# Patient Record
Sex: Female | Born: 1965 | Race: White | Hispanic: No | Marital: Married | Smoking: Never smoker
Health system: Southern US, Community
[De-identification: ages and names within clinical notes are randomized; demographics above are authoritative.]

## PROBLEM LIST (undated history)

## (undated) DIAGNOSIS — Z9889 Other specified postprocedural states: Secondary | ICD-10-CM

## (undated) DIAGNOSIS — Z780 Asymptomatic menopausal state: Secondary | ICD-10-CM

## (undated) DIAGNOSIS — M1990 Unspecified osteoarthritis, unspecified site: Secondary | ICD-10-CM

## (undated) DIAGNOSIS — D759 Disease of blood and blood-forming organs, unspecified: Secondary | ICD-10-CM

## (undated) DIAGNOSIS — I739 Peripheral vascular disease, unspecified: Secondary | ICD-10-CM

## (undated) DIAGNOSIS — R011 Cardiac murmur, unspecified: Secondary | ICD-10-CM

## (undated) DIAGNOSIS — R600 Localized edema: Secondary | ICD-10-CM

## (undated) DIAGNOSIS — C801 Malignant (primary) neoplasm, unspecified: Secondary | ICD-10-CM

## (undated) DIAGNOSIS — I10 Essential (primary) hypertension: Secondary | ICD-10-CM

## (undated) DIAGNOSIS — Z9289 Personal history of other medical treatment: Secondary | ICD-10-CM

## (undated) DIAGNOSIS — F419 Anxiety disorder, unspecified: Secondary | ICD-10-CM

## (undated) DIAGNOSIS — M549 Dorsalgia, unspecified: Secondary | ICD-10-CM

## (undated) DIAGNOSIS — R112 Nausea with vomiting, unspecified: Secondary | ICD-10-CM

## (undated) DIAGNOSIS — F329 Major depressive disorder, single episode, unspecified: Secondary | ICD-10-CM

## (undated) DIAGNOSIS — B519 Plasmodium vivax malaria without complication: Secondary | ICD-10-CM

## (undated) DIAGNOSIS — E2749 Other adrenocortical insufficiency: Secondary | ICD-10-CM

## (undated) DIAGNOSIS — G43909 Migraine, unspecified, not intractable, without status migrainosus: Secondary | ICD-10-CM

## (undated) DIAGNOSIS — E232 Diabetes insipidus: Secondary | ICD-10-CM

## (undated) DIAGNOSIS — J45909 Unspecified asthma, uncomplicated: Secondary | ICD-10-CM

## (undated) DIAGNOSIS — E7800 Pure hypercholesterolemia, unspecified: Secondary | ICD-10-CM

## (undated) DIAGNOSIS — Z8489 Family history of other specified conditions: Secondary | ICD-10-CM

## (undated) DIAGNOSIS — K829 Disease of gallbladder, unspecified: Secondary | ICD-10-CM

## (undated) DIAGNOSIS — F32A Depression, unspecified: Secondary | ICD-10-CM

## (undated) DIAGNOSIS — Z91018 Allergy to other foods: Secondary | ICD-10-CM

## (undated) DIAGNOSIS — F988 Other specified behavioral and emotional disorders with onset usually occurring in childhood and adolescence: Secondary | ICD-10-CM

## (undated) DIAGNOSIS — Z8709 Personal history of other diseases of the respiratory system: Secondary | ICD-10-CM

## (undated) DIAGNOSIS — E559 Vitamin D deficiency, unspecified: Secondary | ICD-10-CM

## (undated) DIAGNOSIS — J189 Pneumonia, unspecified organism: Secondary | ICD-10-CM

## (undated) DIAGNOSIS — Z87440 Personal history of urinary (tract) infections: Secondary | ICD-10-CM

## (undated) HISTORY — PX: SHOULDER ARTHROSCOPY: SHX128

## (undated) HISTORY — PX: DIAGNOSTIC LAPAROSCOPY: SUR761

## (undated) HISTORY — DX: Allergy to other foods: Z91.018

## (undated) HISTORY — PX: BREAST BIOPSY: SHX20

## (undated) HISTORY — PX: TRANSPHENOIDAL PITUITARY RESECTION: SHX2572

## (undated) HISTORY — PX: COLONOSCOPY: SHX174

## (undated) HISTORY — DX: Disease of gallbladder, unspecified: K82.9

## (undated) HISTORY — DX: Asymptomatic menopausal state: Z78.0

## (undated) HISTORY — DX: Other specified behavioral and emotional disorders with onset usually occurring in childhood and adolescence: F98.8

## (undated) HISTORY — PX: APPENDECTOMY: SHX54

## (undated) HISTORY — DX: Unspecified asthma, uncomplicated: J45.909

## (undated) HISTORY — DX: Vitamin D deficiency, unspecified: E55.9

## (undated) HISTORY — DX: Localized edema: R60.0

## (undated) HISTORY — DX: Dorsalgia, unspecified: M54.9

## (undated) HISTORY — DX: Anxiety disorder, unspecified: F41.9

## (undated) HISTORY — PX: BREAST LUMPECTOMY: SHX2

---
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
## 1993-09-26 HISTORY — PX: VAGINAL HYSTERECTOMY: SUR661

## 1997-09-26 DIAGNOSIS — B519 Plasmodium vivax malaria without complication: Secondary | ICD-10-CM

## 1997-09-26 HISTORY — DX: Plasmodium vivax malaria without complication: B51.9

## 1999-02-22 ENCOUNTER — Encounter: Payer: Self-pay | Admitting: Internal Medicine

## 1999-02-22 ENCOUNTER — Inpatient Hospital Stay (HOSPITAL_COMMUNITY): Admission: EM | Admit: 1999-02-22 | Discharge: 1999-02-24 | Payer: Self-pay | Admitting: Emergency Medicine

## 1999-03-01 ENCOUNTER — Inpatient Hospital Stay (HOSPITAL_COMMUNITY): Admission: EM | Admit: 1999-03-01 | Discharge: 1999-03-05 | Payer: Self-pay | Admitting: Emergency Medicine

## 1999-04-06 ENCOUNTER — Encounter: Admission: RE | Admit: 1999-04-06 | Discharge: 1999-04-06 | Payer: Self-pay | Admitting: Internal Medicine

## 1999-05-24 ENCOUNTER — Encounter: Admission: RE | Admit: 1999-05-24 | Discharge: 1999-05-24 | Payer: Self-pay | Admitting: Internal Medicine

## 1999-05-24 ENCOUNTER — Inpatient Hospital Stay (HOSPITAL_COMMUNITY): Admission: AD | Admit: 1999-05-24 | Discharge: 1999-05-25 | Payer: Self-pay | Admitting: Internal Medicine

## 1999-06-04 ENCOUNTER — Encounter: Admission: RE | Admit: 1999-06-04 | Discharge: 1999-06-04 | Payer: Self-pay | Admitting: Internal Medicine

## 1999-06-18 ENCOUNTER — Other Ambulatory Visit: Admission: RE | Admit: 1999-06-18 | Discharge: 1999-06-18 | Payer: Self-pay | Admitting: Obstetrics and Gynecology

## 1999-08-10 ENCOUNTER — Ambulatory Visit (HOSPITAL_COMMUNITY): Admission: RE | Admit: 1999-08-10 | Discharge: 1999-08-10 | Payer: Self-pay | Admitting: Infectious Diseases

## 1999-08-13 ENCOUNTER — Ambulatory Visit (HOSPITAL_COMMUNITY): Admission: RE | Admit: 1999-08-13 | Discharge: 1999-08-13 | Payer: Self-pay | Admitting: Infectious Diseases

## 1999-09-14 ENCOUNTER — Other Ambulatory Visit: Admission: RE | Admit: 1999-09-14 | Discharge: 1999-09-14 | Payer: Self-pay | Admitting: Obstetrics and Gynecology

## 1999-11-02 ENCOUNTER — Encounter (INDEPENDENT_AMBULATORY_CARE_PROVIDER_SITE_OTHER): Payer: Self-pay | Admitting: Specialist

## 1999-11-02 ENCOUNTER — Other Ambulatory Visit: Admission: RE | Admit: 1999-11-02 | Discharge: 1999-11-02 | Payer: Self-pay | Admitting: Obstetrics and Gynecology

## 2000-01-14 ENCOUNTER — Other Ambulatory Visit: Admission: RE | Admit: 2000-01-14 | Discharge: 2000-01-14 | Payer: Self-pay | Admitting: Obstetrics and Gynecology

## 2000-05-05 ENCOUNTER — Other Ambulatory Visit: Admission: RE | Admit: 2000-05-05 | Discharge: 2000-05-05 | Payer: Self-pay | Admitting: Obstetrics and Gynecology

## 2000-10-17 ENCOUNTER — Other Ambulatory Visit: Admission: RE | Admit: 2000-10-17 | Discharge: 2000-10-17 | Payer: Self-pay | Admitting: Obstetrics and Gynecology

## 2002-05-20 ENCOUNTER — Other Ambulatory Visit: Admission: RE | Admit: 2002-05-20 | Discharge: 2002-05-20 | Payer: Self-pay | Admitting: Obstetrics and Gynecology

## 2002-06-19 ENCOUNTER — Ambulatory Visit (HOSPITAL_COMMUNITY): Admission: RE | Admit: 2002-06-19 | Discharge: 2002-06-19 | Payer: Self-pay | Admitting: Gastroenterology

## 2002-06-19 ENCOUNTER — Encounter: Payer: Self-pay | Admitting: Gastroenterology

## 2002-06-20 ENCOUNTER — Encounter: Payer: Self-pay | Admitting: Gastroenterology

## 2002-06-20 ENCOUNTER — Ambulatory Visit (HOSPITAL_COMMUNITY): Admission: RE | Admit: 2002-06-20 | Discharge: 2002-06-20 | Payer: Self-pay | Admitting: Gastroenterology

## 2002-06-21 ENCOUNTER — Inpatient Hospital Stay (HOSPITAL_COMMUNITY): Admission: EM | Admit: 2002-06-21 | Discharge: 2002-06-24 | Payer: Self-pay | Admitting: Emergency Medicine

## 2002-06-21 ENCOUNTER — Encounter (INDEPENDENT_AMBULATORY_CARE_PROVIDER_SITE_OTHER): Payer: Self-pay | Admitting: *Deleted

## 2002-09-26 HISTORY — PX: CHOLECYSTECTOMY: SHX55

## 2002-10-01 ENCOUNTER — Ambulatory Visit (HOSPITAL_COMMUNITY): Admission: RE | Admit: 2002-10-01 | Discharge: 2002-10-01 | Payer: Self-pay | Admitting: Gastroenterology

## 2002-10-04 ENCOUNTER — Ambulatory Visit (HOSPITAL_COMMUNITY): Admission: RE | Admit: 2002-10-04 | Discharge: 2002-10-04 | Payer: Self-pay | Admitting: Gastroenterology

## 2002-10-04 ENCOUNTER — Encounter: Payer: Self-pay | Admitting: Gastroenterology

## 2002-10-11 ENCOUNTER — Encounter: Payer: Self-pay | Admitting: Internal Medicine

## 2002-10-11 ENCOUNTER — Encounter: Admission: RE | Admit: 2002-10-11 | Discharge: 2002-10-11 | Payer: Self-pay | Admitting: Internal Medicine

## 2003-04-30 ENCOUNTER — Other Ambulatory Visit: Admission: RE | Admit: 2003-04-30 | Discharge: 2003-04-30 | Payer: Self-pay | Admitting: Obstetrics and Gynecology

## 2003-06-06 ENCOUNTER — Other Ambulatory Visit: Admission: RE | Admit: 2003-06-06 | Discharge: 2003-06-06 | Payer: Self-pay | Admitting: Obstetrics and Gynecology

## 2003-06-30 ENCOUNTER — Ambulatory Visit (HOSPITAL_COMMUNITY): Admission: RE | Admit: 2003-06-30 | Discharge: 2003-06-30 | Payer: Self-pay | Admitting: Gastroenterology

## 2004-09-06 ENCOUNTER — Emergency Department (HOSPITAL_COMMUNITY): Admission: EM | Admit: 2004-09-06 | Discharge: 2004-09-07 | Payer: Self-pay | Admitting: Emergency Medicine

## 2004-09-26 HISTORY — PX: BLADDER SUSPENSION: SHX72

## 2004-11-03 ENCOUNTER — Other Ambulatory Visit: Admission: RE | Admit: 2004-11-03 | Discharge: 2004-11-03 | Payer: Self-pay | Admitting: Obstetrics and Gynecology

## 2005-01-28 ENCOUNTER — Ambulatory Visit (HOSPITAL_COMMUNITY): Admission: RE | Admit: 2005-01-28 | Discharge: 2005-01-28 | Payer: Self-pay | Admitting: Obstetrics and Gynecology

## 2005-03-21 ENCOUNTER — Observation Stay (HOSPITAL_COMMUNITY): Admission: RE | Admit: 2005-03-21 | Discharge: 2005-03-22 | Payer: Self-pay | Admitting: Urology

## 2006-01-30 ENCOUNTER — Ambulatory Visit (HOSPITAL_COMMUNITY): Admission: RE | Admit: 2006-01-30 | Discharge: 2006-01-30 | Payer: Self-pay | Admitting: Family Medicine

## 2006-05-23 ENCOUNTER — Encounter: Admission: RE | Admit: 2006-05-23 | Discharge: 2006-05-23 | Payer: Self-pay | Admitting: Gastroenterology

## 2006-06-01 ENCOUNTER — Other Ambulatory Visit: Admission: RE | Admit: 2006-06-01 | Discharge: 2006-06-01 | Payer: Self-pay | Admitting: Obstetrics and Gynecology

## 2006-09-04 ENCOUNTER — Encounter (INDEPENDENT_AMBULATORY_CARE_PROVIDER_SITE_OTHER): Payer: Self-pay | Admitting: Specialist

## 2006-09-04 ENCOUNTER — Ambulatory Visit (HOSPITAL_COMMUNITY): Admission: RE | Admit: 2006-09-04 | Discharge: 2006-09-04 | Payer: Self-pay | Admitting: Obstetrics and Gynecology

## 2007-02-21 ENCOUNTER — Ambulatory Visit (HOSPITAL_COMMUNITY): Admission: RE | Admit: 2007-02-21 | Discharge: 2007-02-21 | Payer: Self-pay | Admitting: Obstetrics and Gynecology

## 2007-12-14 ENCOUNTER — Ambulatory Visit (HOSPITAL_BASED_OUTPATIENT_CLINIC_OR_DEPARTMENT_OTHER): Admission: RE | Admit: 2007-12-14 | Discharge: 2007-12-14 | Payer: Self-pay | Admitting: Orthopedic Surgery

## 2008-09-26 HISTORY — PX: REPAIR PERONEAL TENDONS ANKLE: SUR1201

## 2009-03-16 ENCOUNTER — Encounter: Admission: RE | Admit: 2009-03-16 | Discharge: 2009-03-16 | Payer: Self-pay | Admitting: Unknown Physician Specialty

## 2009-04-21 ENCOUNTER — Encounter (INDEPENDENT_AMBULATORY_CARE_PROVIDER_SITE_OTHER): Payer: Self-pay | Admitting: Emergency Medicine

## 2009-04-21 ENCOUNTER — Emergency Department (HOSPITAL_COMMUNITY): Admission: EM | Admit: 2009-04-21 | Discharge: 2009-04-21 | Payer: Self-pay | Admitting: Emergency Medicine

## 2009-04-21 ENCOUNTER — Ambulatory Visit: Payer: Self-pay | Admitting: Vascular Surgery

## 2009-12-17 ENCOUNTER — Encounter: Admission: RE | Admit: 2009-12-17 | Discharge: 2009-12-17 | Payer: Self-pay | Admitting: Family Medicine

## 2009-12-21 ENCOUNTER — Encounter: Admission: RE | Admit: 2009-12-21 | Discharge: 2009-12-21 | Payer: Self-pay | Admitting: Family Medicine

## 2010-03-09 ENCOUNTER — Ambulatory Visit (HOSPITAL_BASED_OUTPATIENT_CLINIC_OR_DEPARTMENT_OTHER): Admission: RE | Admit: 2010-03-09 | Discharge: 2010-03-09 | Payer: Self-pay | Admitting: Surgery

## 2010-03-09 ENCOUNTER — Encounter: Admission: RE | Admit: 2010-03-09 | Discharge: 2010-03-09 | Payer: Self-pay | Admitting: Surgery

## 2010-03-24 ENCOUNTER — Encounter
Admission: RE | Admit: 2010-03-24 | Discharge: 2010-03-24 | Payer: Self-pay | Admitting: Physical Medicine and Rehabilitation

## 2010-10-18 ENCOUNTER — Encounter: Payer: Self-pay | Admitting: Orthopedic Surgery

## 2010-12-13 LAB — BASIC METABOLIC PANEL
GFR calc Af Amer: >60 mL/min (ref >60)
GFR calc non Af Amer: >60 mL/min (ref >60)
Glucose, Bld: 99 mg/dL (ref 70–99)

## 2010-12-13 LAB — POCT HEMOGLOBIN-HEMACUE: Hemoglobin: 15.1 g/dL — ABNORMAL HIGH (ref 12.0–15.0)

## 2010-12-17 ENCOUNTER — Other Ambulatory Visit: Payer: Self-pay | Admitting: Internal Medicine

## 2010-12-17 DIAGNOSIS — Z1231 Encounter for screening mammogram for malignant neoplasm of breast: Secondary | ICD-10-CM

## 2010-12-23 ENCOUNTER — Ambulatory Visit
Admission: RE | Admit: 2010-12-23 | Discharge: 2010-12-23 | Disposition: A | Discharge status: Home / Self Care | Payer: BC Managed Care – PPO | Source: Ambulatory Visit | Attending: Internal Medicine | Admitting: Internal Medicine

## 2010-12-23 DIAGNOSIS — Z1231 Encounter for screening mammogram for malignant neoplasm of breast: Secondary | ICD-10-CM

## 2011-02-08 NOTE — Op Note (Signed)
[name] [name], [name] [name]            ACCOUNT NO.:  [id number]   MEDICAL RECORD NO.:  [id number]          PATIENT TYPE:  AMB   LOCATION:  DSC                          FACILITY:  [facility]   PHYSICIAN:  Katy Fitch. Sypher, M.D. 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   OPERATION:  1. Examination right shoulder under anesthesia.  2. Gentle manipulation increasing range of motion of right shoulder.  3. Arthroscopic evaluation glenohumeral joint identifying significant      adhesions of subscapularis and anterior capsule followed by      anterior capsulectomy, tenolysis of subscapularis,  4. Extensive subacromial bursectomy, tenolysis of rotator cuff,      relaxation of coracoacromial ligament and acromioplasty.   OPERATING SURGEON:  [name] [name], M.D.   ASSISTANT:  [name] Maduro Dasnoit PA-C.   ANESTHESIA:  General by endotracheal technique supplemented by right  interscalene block.   SUPERVISING ANESTHESIOLOGIST:  [name] [name].   INDICATIONS:  [name] [name] is a 45 year old referred through the  courtesy of [name] [name] for evaluation and management of a painful  right shoulder.   [name] [name] had the insidious onset of progressive pain.  She was losing  motion.  She was unable to sleep on her shoulder comfortably at night.  We recommend that she proceed with supervised physical therapy for  several weeks.  Both [name] [name] and our therapist determined that she  was a poor candidate for therapy and that she could not tolerate the  repetitive stretching exercises due to her extreme pain [name].  Therefore we advised to proceed with a timely examination of the  shoulder under anesthesia anticipating manipulation, capsulectomy and  tenolysis of the cuff as necessary.   Preoperatively she was noted on plain films to have moderately  unfavorable medial acromial anatomy with a prominent anterior medial  osteophyte.   She was interviewed in the holding area by [name] [name] for an  anesthesia consult.  The risks and benefits of an interscalene block  were explained followed by placement of interscalene block without  complication.   After questions were invited and answered, she is brought to the  operating room at this time.   DESCRIPTION OF PROCEDURE:  [name] [name] is brought to the operating  room and placed in the supine position on the operating table.   Following induction of general endotracheal anesthesia under Dr.  Jarrett Ables direct supervision, she was carefully positioned in the  beach-chair position with the aid of a torso and head holder designed  for shoulder arthroscopy.  Examination of the right shoulder under  anesthesia revealed moderate stiffness.  Her combined elevation was 140  external rotation, at 90 degrees abduction, 70 degrees internal rotation  60 degrees.  With gentle manipulation lysis of adhesions was noted with  increase of combined elevation to 175 degrees, external rotation  at 90  degrees abduction to 90 degrees and internal rotation to 80 degrees.  The joint was stable to anterior, posterior and inferior translation.   The right arm was then prepped with DuraPrep and draped with impervious  arthroscopy drapes.  The procedure commenced with placement of the  arthroscope through a standard posterior viewing portal.  Diagnostic  arthroscopy revealed intact hyaline articular cartilage surfaces on the  glenoid and humeral head.   The biceps origin was normal.  The biceps tendon was normal through the  rotator interval.  The deep subscapularis was obscured by adhesions that  were rather mature in  appearance.  The subscapularis was intact.  The  supraspinatus, infraspinatus, and teres minor were intact.  The labrum  was intact.   An anterior portal was created under direct vision followed by use of  the suction shaver and cautery to release adhesions around the  subscapularis. The subscap was tenolysed both anterior and posterior  followed by identification of coracoid process.  Care was taken to avoid  the neurovascular structures.  The rotator interval was released with  the suction shaver and cutting cautery.   Attention then directed to the subacromial space.   The scope was removed from the glenohumeral joint and placed in the  subacromial space.  Florid bursitis and adhesions were noted.  After  complete bursectomy, the rotator cuff was tenolysed and the  coracoacromial ligament relaxed with cutting cautery.  The acromion had  a medial osteophyte.  The [facility] joint was not prominent.  The capsule of  the [facility] joint was preserved.  The acromion was leveled to a type 1  morphology with a suction bur followed by hemostasis.  Thereafter,  documentation of the decompression was accomplished with the digital  camera.  The scope was removed the portals repaired with mattress suture  of 3-0 Prolene.   [name] [name] was placed in a sling.  Her wounds dressed with sterile  gauze and paper tape.  She will be provided Tegaderm with advice for  husband to change the dressings in the morning.   I will see back in follow-up in 72 hours at which time we will  anticipate a postoperative x-ray and initiation of a therapy program.  She is provided prescriptions for hydromorphone 2 mg one p.o. q.4-6 h.  p.r.n. pain 30 tablets without refill.  Also Motrin 600 mg one p.o. q. 6  hours p.r.n. pain 30 tablets with one refill and Keflex 500 mg one p.o.  q.8 h x4 days as a prophylactic antibiotic.     Katy Fitch Sypher, M.D.  Electronically Signed    RVS/MEDQ  D:  12/14/2007  T:  12/14/2007  Job:   [id number]   cc:   Katy Fitch. Sypher, M.D.

## 2011-02-11 NOTE — H&P (Signed)
NAME:  [name] [name], [name] [name]            ACCOUNT NO.:  [id number]   MEDICAL RECORD NO.:  [id number]          PATIENT TYPE:  AMB   LOCATION:  SDC                           FACILITY:  [facility]   PHYSICIAN:  Sherron Monday, MD        DATE OF BIRTH:  1966/01/20   DATE OF ADMISSION:  09/04/2006  DATE OF DISCHARGE:                              HISTORY & PHYSICAL   PROCEDURE:  Diagnostic laparoscopy, possible operative laparotomy,  possible lysis of adhesions, fulguration of endometriosis.   HISTORY OF PRESENT ILLNESS:  [name] is a 45 year old Caucasian  female with a history of ovarian cysts and she complains of sharp pain  as well as lower back pain with these. She has had a transvaginal  ultrasound which revealed a cyst on her right ovary, approximately 3 x 2  x 3 and a normal left ovary.   PAST MEDICAL HISTORY:  1. Significant for irritable bowel syndrome diarrhea predominant. She      sees Dr. Loreta Ave.  2. Malaria.   PAST SURGICAL HISTORY:  Significant for a bladder tack, TVH, question of  an LSO.   MEDICATIONS:  Zoloft, Zelnorm.   ALLERGIES:  SULFA (causes hives), PENICILLIN (causes hives and trouble  breathing), PHENERGAN (causes hypotension).   SOCIAL HISTORY:  She denies alcohol, tobacco or drug use. She is  married.   PAST OB/GYN HISTORY:  She has a history of an abnormal Pap smear  (abnormal vaginal dysplasia),  that has been normal since. No history of  any sexually transmitted diseases. Menarche was at age 11. Regular  cycles and heavy flow. She states she has no hot flashes or night sweats  currently. She is a G3, P2, 0, 1, 2 with G1 as a term vaginal delivery,  7 pounds and 14 ounce female infant; she had preeclampsia.  G2 was a  miscarriage. G3 was a term vaginal delivery of a 6 pound female infant.   FAMILY HISTORY:  Her maternal grandmother has diabetes. Mother and  father have hypertension; coronary artery disease in her mother. Breast  cancer also in her mother.   PHYSICAL EXAMINATION:  VITAL SIGNS: She is 5 feet, 9 inches, weight  approximately 205 pounds.  GENERAL: In no apparent distress.  HEENT: Mucous membranes are moist.  NECK: No lymphadenopathy. Thyroid within normal limits.  HEART: Regular rate and rhythm.  LUNGS: Clear to auscultation bilaterally.  BACK: No costovertebral angle tenderness.  BREASTS: No masses, nontender, no distortion.  ABDOMEN: Soft, nontender, nondistended, obese with good bowel sounds.  EXTREMITIES: Symmetric and nontender.  PELVIC EXAM: Normal external female genitalia. Normal BUS. Good support.  Vagina without lesions, there is a well-healed cuff. Bimanual exam  revealed no masses, however tender to palpation on the left greater than  the right. Pap smear was performed at this time which was within normal  limits. She returned with further pain and had a vaginal ultrasound  which revealed the cysts as mentioned above.   PLAN:  45 year old Caucasian female with pelvic pain. The risks,  benefits and alternatives of a diagnostic laparoscopy were discussed  with her and she  voiced understanding of this and desires to proceed.  She will have the surgery as an outpatient on September 04, 2006.      Sherron Monday, MD  Electronically Signed     JB/MEDQ  D:  09/01/2006  T:  09/01/2006  Job:  [id number]

## 2011-02-11 NOTE — Op Note (Signed)
   NAME:  [name] [name], [name] [name]                      ACCOUNT NO.:  [id number]   MEDICAL RECORD NO.:  [id number]                   PATIENT TYPE:  AMB   LOCATION:  ENDO                                 FACILITY:  [facility]   PHYSICIAN:  Anselmo Rod, M.D.               DATE OF BIRTH:  08-04-1966   DATE OF PROCEDURE:  10/01/2002  DATE OF DISCHARGE:                                 OPERATIVE REPORT   PROCEDURE PERFORMED:  Esophagogastroduodenoscopy.   INSTRUMENT USED:  Olympus video panendoscope.   INDICATIONS FOR PROCEDURE:  Epigastric pain in a 45 year old female, rule  out ulcers.   PRE-PROCEDURE PREPARATION:  Informed consent was procured from the patient.  The patient fasted for 8 hours prior to the procedure.   PRE-PROCEDURE PHYSICAL:  VITAL SIGNS:  The patient has stable vital signs.  NECK:  Supple.  CHEST:  Clear to auscultation.  S1 and S2 regular.  ABDOMEN:  Soft with normal bowel sounds.   DESCRIPTION OF PROCEDURE:  The patient was placed in the left lateral  decubitus position and sedated with 80 mg of Demerol and 9 mg of Versed  intravenously.  Once the patient was adequately sedated and maintained on  low-flow oxygen and continuous cardiac monitoring, the Olympus video  panendoscope was advanced through the mouthpiece, over the tongue, and into  the esophagus under direct vision.  The entire esophagus appeared normal  with no evidence of ring, stricture, masses, esophagitis, or Barrett's  mucosa.  The scope was then advanced into the stomach.  The entire gastric  mucosa and the proximal small bowel appeared normal with no evidence of  erosions, ulcerations, masses, or polyps.  There was no evidence of a hiatal  hernia on retroflexion.  The patient tolerated the procedure well without  complications.   IMPRESSION:  Normal esophagogastroduodenoscopy.   RECOMMENDATIONS:  1. Proceed with a CT scan of the abdomen and pelvis at this time with     further recommendations made  thereafter.  2. Avoid all nonsteroidals, including aspirin, for now.                                               Anselmo Rod, M.D.    JNM/MEDQ  D:  10/01/2002  T:  10/01/2002  Job:  [id number]   cc:   Bernadette Hoit  [facility]

## 2011-02-11 NOTE — Op Note (Signed)
   NAME:  [name] [name], [name] [name]                      ACCOUNT NO.:  [id number]   MEDICAL RECORD NO.:  [id number]                   PATIENT TYPE:  AMB   LOCATION:  ENDO                                 FACILITY:  [facility]   PHYSICIAN:  Anselmo Rod, M.D.               DATE OF BIRTH:  1966-02-03   DATE OF PROCEDURE:  06/30/2003  DATE OF DISCHARGE:                                 OPERATIVE REPORT   PROCEDURE:  Colonoscopy.   ENDOSCOPIST:  Anselmo Rod, M.D.   INSTRUMENT USED:  Olympus video colonoscope.   INDICATIONS FOR PROCEDURE:  Rectal bleeding and a history of irritable bowel  syndrome in a 45 year old white female,  rule out colonic polyps, masses,  etc.   PREPROCEDURE PREPARATION:  Informed consent was obtained from the patient  and the patient was  fasted for 8 hours prior to  the procedure and prepped  with a bottle of magnesium citrate and a gallon of GoLYTELY the  night prior  to the procedure.   PREPROCEDURE PHYSICAL:  The patient had stable vital signs. Neck supple.  Chest clear to auscultation, S1, S2 regular. Abdomen soft with normoactive  bowel sounds.   DESCRIPTION OF PROCEDURE:  The patient was placed in the left lateral  decubitus position and sedated with 120 mg of Demerol and 12 mg of Versed  intravenously. Once sedation was adequate, the patient was maintained on low  flow oxygen and continuous cardiac monitoring.   The Olympus video colonoscope was advanced from the rectum to the cecum and  terminal ileum without difficulty. Except for small  internal hemorrhoids on  other  abnormalities were noted. The entire colonic mucosa appeared  healthy. The patient had some discomfort with  insufflation of the air into  colon, indicating visceral hypersensitivity consistent with irritable bowel  syndrome.   IMPRESSION:  1. Normal colonoscopy except small  internal hemorrhoids.  2. Visceral hypersensitivity perceived with  insufflation of air into the     colon  indicating a component of irritable bowel syndrome.    RECOMMENDATIONS:  1. Continue a high fiber diet with liberal fluid intake.  2. Repeat  colorectal cancer screening at age 24 and if the patient has any     abnormal symptoms in the interim.                                               Anselmo Rod, M.D.    JNM/MEDQ  D:  06/30/2003  T:  06/30/2003  Job:  [id number]   cc:   Marjory Lies, M.D.  [address]  [place]  [place] [address]  Fax: [phone]

## 2011-02-11 NOTE — Op Note (Signed)
NAME:  [name] [name], [name] [name]                      ACCOUNT NO.:  [id number]   MEDICAL RECORD NO.:  [id number]                   PATIENT TYPE:  INP   LOCATION:  5738                                 FACILITY:  [facility]   PHYSICIAN:  Gabrielle Dare. Janee Morn, M.D.             DATE OF BIRTH:  1966/09/22   DATE OF PROCEDURE:  06/21/2002  DATE OF DISCHARGE:                                 OPERATIVE REPORT   PREOPERATIVE DIAGNOSIS:  Symptomatic biliary dyskinesia.   POSTOPERATIVE DIAGNOSIS:  Symptomatic biliary dyskinesia.   PROCEDURE:  Laparoscopic cholecystectomy.   SURGEON:  Gabrielle Dare. Janee Morn, M.D.   ASSISTANT:  Rose Phi. Maple [name], M.D.   ANESTHESIA:  General.   CLINICAL NOTE:  The patient is a 45 year old white female with a one-week  history of right upper quadrant pain radiating to her back associated with  nausea and vomiting.  She was evaluated by Charna Elizabeth, M.D., in  gastroenterology three days ago and sent for an ultrasound, which was  normal, but she continued having the pain with nausea and vomiting and had a  HIDA scan done yesterday, which demonstrated the gallbladder without signs  of cholecystitis, but it had a very low ejection fraction of 18.9%,  consistent with biliary dyskinesia versus some chronic cholecystitis.  During the night she continued to have some pain and low-grade fevers, and  she presented to the emergency room this morning.  After evaluation it was  planned to take her to the operating room for laparoscopic cholecystectomy.   DESCRIPTION OF PROCEDURE:  The patient was brought to the operating room and  general anesthesia was administered.  The patient's abdomen was prepped and  draped in a sterile fashion.  A midline incision was made inferior to her  umbilicus through an old scar.  Subcutaneous tissues were dissected down,  and the anterior fascia was incised.  The peritoneal cavity was entered  safely, and a pursestring suture was placed around the anterior  abdominal  fascia.  The Hasson trocar was placed, and the abdomen was insufflated with  carbon dioxide in standard fashion.  Subsequently under direct vision a #10  port was placed in the epigastrium and two #5 ports were placed in the  lateral abdomen in standard locations after given local anesthetic 0.25%  Marcaine in the port sites.  Subsequently the gallbladder dome was retracted  superiorly, and the infundibulum was retracted inferolaterally.  The cystic  duct was dissected without difficulty at the cystic duct-infundibular  junction.  A large, clear window was made between the cystic duct and the  gallbladder and the liver.  Once this was satisfactory, the three clips were  placed proximally and one was placed distally, and it was divided.  Subsequently the cystic artery was dissected out and clipped three times  proximally and one time distally without difficulty.  The gallbladder was  then taken off the bed of the liver using electrocautery.  A few  scattered  places on the liver bed were bovied for good hemostasis, and the gallbladder  was completely removed from the liver bed.  It was placed in an EndoCatch  bag and removed through the umbilical port.  Subsequently the gallbladder  bed was rechecked.  Meticulous hemostasis was obtained, and the area was  copiously irrigated there and above the liver.  The irrigation fluid was  clear and the port sites were removed under direct vision.  The Hasson  trocar was removed and the infraumbilical fascia was closed by tying the  pursestring suture.  Subsequently all incisions were closed with running 4-0 Vicryl subcuticular  stitch.  Benzoin and Steri-Strips were applied.  Sponge and needle counts  were all correct.  There were no apparent complications.  The patient  tolerated the procedure well and was taken to the recovery room in stable  condition.                                               Gabrielle Dare Janee Morn, M.D.    BET/MEDQ   D:  06/21/2002  T:  06/24/2002  Job:  [id number]

## 2011-02-11 NOTE — Op Note (Signed)
[name] [name], [name] [name]            ACCOUNT NO.:  [id number]   MEDICAL RECORD NO.:  [id number]          PATIENT TYPE:  AMB   LOCATION:  SDC                           FACILITY[facility]   PHYSICIAN:  Sherron Monday, MD        DATE OF BIRTH:  [birth year]   DATE OF PROCEDURE:  09/04/2006  DATE OF DISCHARGE:                               OPERATIVE REPORT   PREOPERATIVE DIAGNOSES:  1. Pelvic pain.  2. History of ovarian cyst.  3. Adhesions.   POSTOPERATIVE DIAGNOSES:  1. Pelvic pain.  2. Peritubal cysts bilaterally.   PROCEDURE:  Operative laparoscopy, removal of bilateral peritubal cysts.   SURGEON:  Sherron Monday, MD.   ANESTHESIA:  General, local.   SPECIMENS:  Peritubal cysts.   ESTIMATED BLOOD LOSS:  Minimal.   INTRAVENOUS FLUIDS:  1500 mL.   URINE OUTPUT:  50 mL per I&O cath.   COMPLICATIONS:  None.   DISPOSITION:  Stable to PACU after the procedure.   DESCRIPTION OF PROCEDURE:  After informed consent was reviewed with the  patient including risks, benefits, and alternatives of surgical  procedure, she was prepped and draped and taken to the operating room  where general anesthesia was induced and found to be adequate.  She was  then placed in the Y[place]ofin stirrups and prepped and draped in a normal  fashion.  Her bladder was drained and the sponge stick was placed in her  vagina for manipulation of her cuff. An approximately 5mm  infraumbilical incision was made.  Veress needle  was used to enter the  peritoneal cavity.  Several tests were performed to assure that it was  in the peritoneal cavity and pneumoperitoneum was obtained, then using  the camera in the 5 mm port, direct entry was performed without  complication.  Pneumoperitoneum was obtained after verification of  placement.  Brief survey of the pelvis revealed history of an LSO and a  normal appearing right tube and ovary.  An approximately 5 mm accessory  port was placed in the left lower quadrant after  visualization of the  umbilical vein.  A blunt probe facilitated the survey.  Paratubal cysts  were noted on both the left and right and a minimal adipose adhesion to  her vaginal cuff was also noted.  Normal liver edge and scarring  consistent with previous appendectomy.  Using the Harmonic scalpel the  peritubal cysts were removed.  Again, a survey of the pelvis was  performed.  Hemostasis was assured.  Under direct visualization, the 5  mm port was removed from the left side and the pneumoperitoneum was  evacuated.  The umbilical port was removed under direct visualization.  Skin was closed with 0 Vicryl and Steri-Strips were applied.  Patient  tolerated the procedure well.  Sponge, lap and needle counts correct x2  per the operating room staff and she was awakened and taken in stable  condition to the PACU.      Sherron Monday, MD  Electronically Signed     JB/MEDQ  D:  09/04/2006  T:  09/04/2006  Job:  [id number]

## 2011-02-11 NOTE — Discharge Summary (Signed)
   NAME:  [name] [name], [name] [name]                      ACCOUNT NO.:  [id number]   MEDICAL RECORD NO.:  [id number]                   PATIENT TYPE:  INP   LOCATION:  5738                                 FACILITY:  [facility]   PHYSICIAN:  Gabrielle Dare. Janee Morn, M.D.             DATE OF BIRTH:  Sep 23, 1966   DATE OF ADMISSION:  06/21/2002  DATE OF DISCHARGE:  06/24/2002                                 DISCHARGE SUMMARY   DISCHARGE DIAGNOSIS:  Status post cholecystectomy.   HISTORY OF PRESENT ILLNESS:  The patient is a 45 year old white female who  has a one week history of right upper quadrant pain with nausea and  vomiting. She was evaluated as an outpatient and had a normal ultrasound and  a HIDA scan which demonstrated an ejection fraction of her gallbladder of  18.9%, consistent with biliary dyskinesia versus chronic cholecystitis and  she was admitted to the hospital, planning to undergo cholecystectomy.   HOSPITAL COURSE:  After admission the patient underwent an uncomplicated  laparoscopic cholecystectomy. Postoperatively she continued to have  significant nausea, which was controlled with Zofran. She did tolerate  gradual advancement of  her diet, and on the night of hospital day #2 was  able to keep down some regular food. On the morning of postop day #3 she was  feeling better. She was discharged today in stable condition.   DISCHARGE INSTRUCTIONS:  Her discharge diet is regular. Discharge activity  is  no heavy lifting and no heavy activity.   DISCHARGE MEDICATIONS:  1. Percocet 1 p.o. q.6h.  p.r.n. pain.  2. Zofran 8 mg p.o. b.i.d. p.r.n. nausea.   FOLLOW UP:  The patient is to follow up with me in my office in two weeks.  She is going to call  and make that appointment.                                               Gabrielle Dare Janee Morn, M.D.    BET/MEDQ  D:  06/24/2002  T:  06/26/2002  Job:  [id number]

## 2011-02-11 NOTE — Op Note (Signed)
[name], [name]            ACCOUNT NO.:  [id number]   MEDICAL RECORD NO.:  [id number]          PATIENT TYPE:  AMB   LOCATION:  DAY                          FACILITY:  [facility]   PHYSICIAN:  Valetta Fuller, M.D.  DATE OF BIRTH:  1966/07/22   DATE OF PROCEDURE:  DATE OF DISCHARGE:                                 OPERATIVE REPORT   PREOPERATIVE DIAGNOSIS:  1.  Stress urinary incontinence.  2.  Interstitial cystitis.   POSTOPERATIVE DIAGNOSIS:  1.  Stress urinary incontinence.  2.  Interstitial cystitis.   PROCEDURE PERFORMED:  Cystoscopy, hydraulic overdistention of bladder with  installation and placement of transobturator suburethral sling.   SURGEON:  Valetta Fuller, M.D.   ANESTHESIA:  General.   INDICATIONS:  [name] [name] is a 45 year old female. I have cared for her in the  past, approximately 8-10 years ago, because of pelvic pain and overactive  bladder symptoms. She underwent a cystoscopy with hydraulic overdistention  of the bladder. At that time, she was noted to have slightly reduced  capacity of 800 cc and 4+ diffuse glomerular hemorrhaging consistent with  interstitial cystitis. She had marked increased bladder pain and discomfort  at that time for about 2-3 weeks and then got marked improvement. She  continued to do quite well and I have not seen her for several years. She  came in recently complaining of about 2 years of progressively worsening  stress incontinence. She had leakage with almost any kind of activity and  this was quite bothersome to her. She has also had some frequency and  urgency but not much in the way of urge leakage. Her incontinence is almost  completely stress incontinence. The patient has also had increased nocturia  frequency and painful intercourse. We felt on her assessment that she did  not have much in the way of a cystocele. 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 The patient is status post a hysterectomy in the past. We  felt the patient had fairly straightforward stress incontinence on the basis  of urethral hypermobility. We also thought she had recurrent pelvic pain and  pressure with dyspareunia and increased voiding symptoms potentially  consistent with interstitial cystitis. We recommended consideration for a  combined procedure. She appeared to understand the advantages and  disadvantages, potential complications, and risks of the sling surgery.   TECHNIQUE AND FINDINGS:  The patient was brought to the operating room where  she had successful induction of general anesthesia. She was placed in the  mid lithotomy position, prepped and draped in the usual manner. A Foley  catheter was placed and the bladder was drained. The anterior vaginal wall  overlying the mid urethra was infiltrated and a small incision was made. We  were able then to dissect the planes between the vaginal wall and mid  urethra feeling the area towards the obturator fossa. Once the dissection  was complete on both sides, we made two small stab incisions at  approximately the level of the clitoris about 5 cm lateral. Utilizing the  special helical passers, we were able to pass these with direct digital  finger control without any difficulty whatsoever. The sling was grabbed on  both sides and brought out the separate lateral stab incisions. The sling  itself was positioned at the mid urethra and a right-angle clamp was  utilized to assure proper tension. Redundant sling was then transected at  the level of the skin after sheath removal. We did perform cystoscopy. This  revealed no evidence of bladder injury. We went ahead with hydraulic  overdistention of the bladder to 1 meter of water pressure for 5 minutes.  Capacity was fairly normal at 800 cc. She had approximately 2+ diffuse  glomerulations. At the end of procedure, we went ahead and drained her  bladder, then put some Marcaine in her bladder. We left that indwelling. We   went ahead and closed the vaginal incision with 2-0 Vicryl suture in a  running manner. At the completion of the procedure, vaginal packing was  placed. The patient appeared to tolerate the procedure well and was brought  to recovery room in stable condition.       DSG/MEDQ  D:  03/21/2005  T:  03/21/2005  Job:  [id number]

## 2011-05-10 ENCOUNTER — Other Ambulatory Visit: Payer: Self-pay | Admitting: Physician Assistant

## 2011-06-20 LAB — POCT HEMOGLOBIN-HEMACUE: Hemoglobin: 14

## 2011-06-27 HISTORY — PX: MELANOMA EXCISION: SHX5266

## 2012-02-14 ENCOUNTER — Other Ambulatory Visit: Payer: Self-pay | Admitting: Specialist

## 2012-02-14 DIAGNOSIS — M549 Dorsalgia, unspecified: Secondary | ICD-10-CM

## 2012-02-15 ENCOUNTER — Ambulatory Visit
Admission: RE | Admit: 2012-02-15 | Discharge: 2012-02-15 | Disposition: A | Discharge status: Home / Self Care | Payer: BC Managed Care – PPO | Source: Ambulatory Visit | Attending: Specialist | Admitting: Specialist

## 2012-02-15 VITALS — BP 97/52 | HR 67

## 2012-02-15 DIAGNOSIS — M549 Dorsalgia, unspecified: Secondary | ICD-10-CM

## 2012-02-15 MED ORDER — DIAZEPAM 5 MG PO TABS
10.0000 mg | ORAL_TABLET | Freq: Once | ORAL | Status: AC
  Administered 2012-02-15: 10 mg via ORAL

## 2012-02-15 MED ORDER — ONDANSETRON HCL 4 MG/2ML IJ SOLN
4.0000 mg | Freq: Once | INTRAMUSCULAR | Status: AC
  Administered 2012-02-15: 4 mg via INTRAMUSCULAR

## 2012-02-15 MED ORDER — IOHEXOL 180 MG/ML  SOLN
18.0000 mL | Freq: Once | INTRAMUSCULAR | Status: AC | PRN
  Administered 2012-02-15: 18 mL via INTRATHECAL

## 2012-02-15 MED ORDER — ONDANSETRON HCL 4 MG/2ML IJ SOLN: 4.0000 mg | Freq: Four times a day (QID) | INTRAMUSCULAR | Status: DC | PRN

## 2012-02-15 MED ORDER — MEPERIDINE HCL 100 MG/ML IJ SOLN
75.0000 mg | Freq: Once | INTRAMUSCULAR | Status: AC
  Administered 2012-02-15: 75 mg via INTRAMUSCULAR

## 2012-02-15 NOTE — Discharge Instructions (Signed)
Myelogram Discharge Instructions  1. Go home and rest quietly for the next 24 hours.  It is important to lie flat for the next 24 hours.  Get up only to go to the restroom.  You may lie in the bed or on a couch on your back, your stomach, your left side or your right side.  You may have one pillow under your head.  You may have pillows between your knees while you are on your side or under your knees while you are on your back.  2. DO NOT drive today.  Recline the seat as far back as it will go, while still wearing your seat belt, on the way home.  3. You may get up to go to the bathroom as needed.  You may sit up for 10 minutes to eat.  You may resume your normal diet and medications unless otherwise indicated.  Drink lots of extra fluids today and tomorrow.  4. The incidence of headache, nausea, or vomiting is about 5% (one in 20 patients).  If you develop a headache, lie flat and drink plenty of fluids until the headache goes away.  Caffeinated beverages may be helpful.  If you develop severe nausea and vomiting or a headache that does not go away with flat bed rest, call [phone].  5. You may resume normal activities after your 24 hours of bed rest is over; however, do not exert yourself strongly or do any heavy lifting tomorrow. If when you get up you have a headache when standing, go back to bed and force fluids for another 24 hours.  6. Call your physician for a follow-up appointment.  The results of your myelogram will be sent directly to your physician by the following day.  7. If you have any questions or if complications develop after you arrive home, please call [phone].  Discharge instructions have been explained to the patient.  The patient, or the person responsible for the patient, fully understands these instructions.       May resume lexapro on Feb 16, 2012, after 1:00 pm.

## 2012-02-15 NOTE — Progress Notes (Signed)
Patient's daughter's (Amy) wallet found in parking lot.  Amy notified.  Wallet locked in locker with key placed on patient's clothing hanger. jkl

## 2012-02-15 NOTE — Progress Notes (Signed)
Patient states she has been off Lexapro for the past two days.  jkl 

## 2012-02-17 ENCOUNTER — Other Ambulatory Visit: Payer: BC Managed Care – PPO

## 2012-02-21 SURGERY — Surgical Case
Anesthesia: *Unknown

## 2012-03-08 ENCOUNTER — Encounter (HOSPITAL_COMMUNITY): Payer: Self-pay | Admitting: Pharmacy Technician

## 2012-03-09 ENCOUNTER — Encounter (HOSPITAL_COMMUNITY): Payer: Self-pay

## 2012-03-09 ENCOUNTER — Ambulatory Visit (HOSPITAL_COMMUNITY)
Admission: RE | Admit: 2012-03-09 | Discharge: 2012-03-09 | Disposition: A | Discharge status: Home / Self Care | Payer: BC Managed Care – PPO | Source: Ambulatory Visit | Attending: Physician Assistant | Admitting: Physician Assistant

## 2012-03-09 ENCOUNTER — Encounter (HOSPITAL_COMMUNITY)
Admission: RE | Admit: 2012-03-09 | Discharge: 2012-03-09 | Disposition: A | Discharge status: Home / Self Care | Payer: BC Managed Care – PPO | Source: Ambulatory Visit | Attending: Specialist | Admitting: Specialist

## 2012-03-09 DIAGNOSIS — Z01818 Encounter for other preprocedural examination: Secondary | ICD-10-CM | POA: Insufficient documentation

## 2012-03-09 DIAGNOSIS — Z01812 Encounter for preprocedural laboratory examination: Secondary | ICD-10-CM | POA: Insufficient documentation

## 2012-03-09 HISTORY — DX: Unspecified osteoarthritis, unspecified site: M19.90

## 2012-03-09 HISTORY — DX: Disease of blood and blood-forming organs, unspecified: D75.9

## 2012-03-09 HISTORY — DX: Cardiac murmur, unspecified: R01.1

## 2012-03-09 HISTORY — DX: Nausea with vomiting, unspecified: R11.2

## 2012-03-09 HISTORY — DX: Other specified postprocedural states: Z98.890

## 2012-03-09 HISTORY — DX: Essential (primary) hypertension: I10

## 2012-03-09 LAB — CBC
HCT: 44.4 % (ref 36.0–46.0)
MCV: 92.7 fL (ref 78.0–100.0)
Platelets: 268 10*3/uL (ref 150–400)
RBC: 4.79 MIL/uL (ref 3.87–5.11)
RDW: 13 % (ref 11.5–15.5)
WBC: 7.3 10*3/uL (ref 4.0–10.5)

## 2012-03-09 LAB — BASIC METABOLIC PANEL
CO2: 28 mEq/L (ref 19–32)
Calcium: 9.7 mg/dL (ref 8.4–10.5)
Chloride: 99 mEq/L (ref 96–112)
Creatinine, Ser: 0.88 mg/dL (ref 0.50–1.10)
GFR calc Af Amer: >90 mL/min (ref >90)
Sodium: 138 mEq/L (ref 135–145)

## 2012-03-09 LAB — URINALYSIS, ROUTINE W REFLEX MICROSCOPIC
Nitrite: NEGATIVE
Specific Gravity, Urine: 1.018 (ref 1.005–1.030)
Urobilinogen, UA: 1 mg/dL (ref 0.0–1.0)
pH: 6.5 (ref 5.0–8.0)

## 2012-03-09 LAB — SURGICAL PCR SCREEN: Staphylococcus aureus: NEGATIVE

## 2012-03-09 NOTE — Pre-Procedure Instructions (Signed)
[address] Chis[address] T Fourrier  03/09/2012   Your procedure is scheduled on:  March 19, 2012 Monday at 0730 AM  Report to [facility] at 0530 AM.  Call this number if you have problems the morning of surgery: [phone]   Remember:   Do not eat food or drink:After Midnight. Sunday      Take these medicines the morning of surgery with A SIP OF WATER: Lexapro, Topamax, and pain med    Do not wear jewelry, make-up or nail polish.  Do not wear lotions, powders, or perfumes. You may wear deodorant.  Do not shave 48 hours prior to surgery. Men may shave face and neck.  Do not bring valuables to the hospital.  Contacts, dentures or bridgework may not be worn into surgery.  Leave suitcase in the car. After surgery it may be brought to your room.  For patients admitted to the hospital, checkout time is 11:00 AM the day of discharge.   Patients discharged the day of surgery will not be allowed to drive home.    Special Instructions: Incentive Spirometry - Practice and bring it with you on the day of surgery. and CHG Shower Use Special Wash: 1/2 bottle night before surgery and 1/2 bottle morning of surgery.   Please read over the following fact sheets that you were given: Pain Booklet, Coughing and Deep Breathing, MRSA Information and Surgical Site Infection Prevention

## 2012-03-18 MED ORDER — CHLORHEXIDINE GLUCONATE 4 % EX LIQD: 60.0000 mL | Freq: Once | CUTANEOUS | Status: DC

## 2012-03-18 MED ORDER — VANCOMYCIN HCL IN DEXTROSE 1-5 GM/200ML-% IV SOLN
1000.0000 mg | INTRAVENOUS | Status: AC
  Administered 2012-03-19: 1000 mg via INTRAVENOUS
  Filled 2012-03-18: qty 200

## 2012-03-19 ENCOUNTER — Encounter (HOSPITAL_COMMUNITY): Payer: Self-pay | Admitting: Anesthesiology

## 2012-03-19 ENCOUNTER — Encounter (HOSPITAL_COMMUNITY)
Admission: RE | Disposition: A | Discharge status: Home Health | Payer: Self-pay | Source: Ambulatory Visit | Attending: Specialist

## 2012-03-19 ENCOUNTER — Inpatient Hospital Stay (HOSPITAL_COMMUNITY)
Admission: RE | Admit: 2012-03-19 | Discharge: 2012-03-23 | DRG: 757 | Disposition: A | Discharge status: Home Health | Payer: BC Managed Care – PPO | Source: Ambulatory Visit | Attending: Specialist | Admitting: Specialist

## 2012-03-19 ENCOUNTER — Encounter (HOSPITAL_COMMUNITY): Payer: Self-pay | Admitting: *Deleted

## 2012-03-19 ENCOUNTER — Ambulatory Visit (HOSPITAL_COMMUNITY): Payer: BC Managed Care – PPO

## 2012-03-19 ENCOUNTER — Encounter (HOSPITAL_COMMUNITY): Payer: Self-pay | Admitting: Specialist

## 2012-03-19 ENCOUNTER — Ambulatory Visit (HOSPITAL_COMMUNITY): Payer: BC Managed Care – PPO | Admitting: Anesthesiology

## 2012-03-19 DIAGNOSIS — Z882 Allergy status to sulfonamides status: Secondary | ICD-10-CM

## 2012-03-19 DIAGNOSIS — M5126 Other intervertebral disc displacement, lumbar region: Secondary | ICD-10-CM | POA: Diagnosis present

## 2012-03-19 DIAGNOSIS — I959 Hypotension, unspecified: Secondary | ICD-10-CM | POA: Diagnosis not present

## 2012-03-19 DIAGNOSIS — F329 Major depressive disorder, single episode, unspecified: Secondary | ICD-10-CM | POA: Diagnosis present

## 2012-03-19 DIAGNOSIS — Z881 Allergy status to other antibiotic agents status: Secondary | ICD-10-CM

## 2012-03-19 DIAGNOSIS — Z85820 Personal history of malignant melanoma of skin: Secondary | ICD-10-CM

## 2012-03-19 DIAGNOSIS — I10 Essential (primary) hypertension: Secondary | ICD-10-CM | POA: Diagnosis present

## 2012-03-19 DIAGNOSIS — Z888 Allergy status to other drugs, medicaments and biological substances status: Secondary | ICD-10-CM

## 2012-03-19 DIAGNOSIS — T502X5A Adverse effect of carbonic-anhydrase inhibitors, benzothiadiazides and other diuretics, initial encounter: Secondary | ICD-10-CM | POA: Diagnosis not present

## 2012-03-19 DIAGNOSIS — Z880 Allergy status to penicillin: Secondary | ICD-10-CM

## 2012-03-19 DIAGNOSIS — Z90710 Acquired absence of both cervix and uterus: Secondary | ICD-10-CM

## 2012-03-19 DIAGNOSIS — F3289 Other specified depressive episodes: Secondary | ICD-10-CM | POA: Diagnosis present

## 2012-03-19 DIAGNOSIS — E860 Dehydration: Secondary | ICD-10-CM | POA: Diagnosis not present

## 2012-03-19 DIAGNOSIS — E785 Hyperlipidemia, unspecified: Secondary | ICD-10-CM | POA: Diagnosis present

## 2012-03-19 DIAGNOSIS — D649 Anemia, unspecified: Secondary | ICD-10-CM | POA: Diagnosis not present

## 2012-03-19 DIAGNOSIS — M48062 Spinal stenosis, lumbar region with neurogenic claudication: Secondary | ICD-10-CM | POA: Diagnosis present

## 2012-03-19 DIAGNOSIS — E876 Hypokalemia: Secondary | ICD-10-CM | POA: Diagnosis not present

## 2012-03-19 DIAGNOSIS — M129 Arthropathy, unspecified: Secondary | ICD-10-CM | POA: Diagnosis present

## 2012-03-19 HISTORY — PX: LUMBAR LAMINECTOMY: SHX95

## 2012-03-19 SURGERY — MICRODISCECTOMY LUMBAR LAMINECTOMY
Anesthesia: General | Site: Back | Laterality: Right | Wound class: Clean

## 2012-03-19 MED ORDER — ATORVASTATIN CALCIUM 20 MG PO TABS
20.0000 mg | ORAL_TABLET | Freq: Every day | ORAL | Status: DC
  Administered 2012-03-19 – 2012-03-22 (×4): 20 mg via ORAL
  Filled 2012-03-19 (×5): qty 1

## 2012-03-19 MED ORDER — OXYCODONE-ACETAMINOPHEN 5-325 MG PO TABS
1.0000 | ORAL_TABLET | ORAL | Status: DC | PRN
  Administered 2012-03-19 – 2012-03-20 (×4): 2 via ORAL
  Administered 2012-03-20: 1 via ORAL
  Administered 2012-03-20: 2 via ORAL
  Administered 2012-03-20: 1 via ORAL
  Administered 2012-03-21 (×2): 2 via ORAL
  Administered 2012-03-21: 1 via ORAL
  Administered 2012-03-21: 2 via ORAL
  Administered 2012-03-21: 1 via ORAL
  Administered 2012-03-22 – 2012-03-23 (×7): 2 via ORAL
  Filled 2012-03-19 (×18): qty 2

## 2012-03-19 MED ORDER — ACETAMINOPHEN 325 MG PO TABS: 650.0000 mg | ORAL_TABLET | ORAL | Status: DC | PRN

## 2012-03-19 MED ORDER — DROPERIDOL 2.5 MG/ML IJ SOLN: 0.6250 mg | INTRAMUSCULAR | Status: DC | PRN

## 2012-03-19 MED ORDER — MENTHOL 3 MG MT LOZG
1.0000 | LOZENGE | OROMUCOSAL | Status: DC | PRN
  Filled 2012-03-19: qty 9

## 2012-03-19 MED ORDER — POLYETHYLENE GLYCOL 3350 17 G PO PACK
17.0000 g | PACK | Freq: Every day | ORAL | Status: DC | PRN
  Administered 2012-03-20: 17 g via ORAL
  Filled 2012-03-19: qty 1

## 2012-03-19 MED ORDER — HYDROMORPHONE HCL PF 1 MG/ML IJ SOLN
0.5000 mg | INTRAMUSCULAR | Status: DC | PRN
  Administered 2012-03-19 – 2012-03-21 (×8): 1 mg via INTRAVENOUS
  Filled 2012-03-19 (×8): qty 1

## 2012-03-19 MED ORDER — MIDAZOLAM HCL 5 MG/5ML IJ SOLN
INTRAMUSCULAR | Status: DC | PRN
  Administered 2012-03-19: 2 mg via INTRAVENOUS

## 2012-03-19 MED ORDER — PHENOL 1.4 % MT LIQD
1.0000 | OROMUCOSAL | Status: DC | PRN
Start: 2012-03-19 — End: 2012-03-23

## 2012-03-19 MED ORDER — POTASSIUM CHLORIDE IN NACL 20-0.45 MEQ/L-% IV SOLN
INTRAVENOUS | Status: DC
  Administered 2012-03-19: 23:00:00 via INTRAVENOUS
  Filled 2012-03-19 (×2): qty 1000

## 2012-03-19 MED ORDER — OXYCODONE-ACETAMINOPHEN 5-325 MG PO TABS
ORAL_TABLET | ORAL | Status: AC
  Filled 2012-03-19: qty 2

## 2012-03-19 MED ORDER — LIDOCAINE HCL (CARDIAC) 20 MG/ML IV SOLN
INTRAVENOUS | Status: DC | PRN
  Administered 2012-03-19: 80 mg via INTRAVENOUS

## 2012-03-19 MED ORDER — PROPOFOL 10 MG/ML IV EMUL
INTRAVENOUS | Status: DC | PRN
  Administered 2012-03-19: 150 mg via INTRAVENOUS

## 2012-03-19 MED ORDER — 0.9 % SODIUM CHLORIDE (POUR BTL) OPTIME
TOPICAL | Status: DC | PRN
  Administered 2012-03-19: 1000 mL

## 2012-03-19 MED ORDER — DOCUSATE SODIUM 100 MG PO CAPS
100.0000 mg | ORAL_CAPSULE | Freq: Two times a day (BID) | ORAL | Status: DC
  Administered 2012-03-19 – 2012-03-23 (×8): 100 mg via ORAL
  Filled 2012-03-19 (×6): qty 1

## 2012-03-19 MED ORDER — DIPHENHYDRAMINE HCL 25 MG PO CAPS
50.0000 mg | ORAL_CAPSULE | Freq: Three times a day (TID) | ORAL | Status: DC | PRN
  Administered 2012-03-19: 50 mg via ORAL
  Filled 2012-03-19: qty 2

## 2012-03-19 MED ORDER — GLYCOPYRROLATE 0.2 MG/ML IJ SOLN
INTRAMUSCULAR | Status: DC | PRN
  Administered 2012-03-19: 0.4 mg via INTRAVENOUS

## 2012-03-19 MED ORDER — ACETAMINOPHEN 650 MG RE SUPP: 650.0000 mg | RECTAL | Status: DC | PRN

## 2012-03-19 MED ORDER — THROMBIN 20000 UNITS EX KIT
PACK | CUTANEOUS | Status: DC | PRN
  Administered 2012-03-19 (×2): via TOPICAL

## 2012-03-19 MED ORDER — ALUM & MAG HYDROXIDE-SIMETH 200-200-20 MG/5ML PO SUSP: 30.0000 mL | Freq: Four times a day (QID) | ORAL | Status: DC | PRN

## 2012-03-19 MED ORDER — ONDANSETRON HCL 4 MG/2ML IJ SOLN: 4.0000 mg | INTRAMUSCULAR | Status: DC | PRN

## 2012-03-19 MED ORDER — HYDROCHLOROTHIAZIDE 25 MG PO TABS
25.0000 mg | ORAL_TABLET | Freq: Every day | ORAL | Status: DC
  Administered 2012-03-19 – 2012-03-21 (×2): 25 mg via ORAL
  Filled 2012-03-19 (×4): qty 1

## 2012-03-19 MED ORDER — HYDROCODONE-ACETAMINOPHEN 5-325 MG PO TABS
1.0000 | ORAL_TABLET | ORAL | Status: DC | PRN
  Administered 2012-03-21: 2 via ORAL
  Filled 2012-03-19 (×3): qty 2

## 2012-03-19 MED ORDER — KETOROLAC TROMETHAMINE 30 MG/ML IJ SOLN
30.0000 mg | Freq: Once | INTRAMUSCULAR | Status: AC
  Administered 2012-03-19: 30 mg via INTRAVENOUS

## 2012-03-19 MED ORDER — METHOCARBAMOL 500 MG PO TABS
ORAL_TABLET | ORAL | Status: AC
  Filled 2012-03-19: qty 1

## 2012-03-19 MED ORDER — NEOSTIGMINE METHYLSULFATE 1 MG/ML IJ SOLN
INTRAMUSCULAR | Status: DC | PRN
  Administered 2012-03-19: 3 mg via INTRAVENOUS

## 2012-03-19 MED ORDER — VANCOMYCIN HCL IN DEXTROSE 1-5 GM/200ML-% IV SOLN
1000.0000 mg | Freq: Once | INTRAVENOUS | Status: AC
  Administered 2012-03-19: 1000 mg via INTRAVENOUS
  Filled 2012-03-19: qty 200

## 2012-03-19 MED ORDER — ROCURONIUM BROMIDE 100 MG/10ML IV SOLN
INTRAVENOUS | Status: DC | PRN
  Administered 2012-03-19: 50 mg via INTRAVENOUS

## 2012-03-19 MED ORDER — LACTATED RINGERS IV SOLN
INTRAVENOUS | Status: DC | PRN
  Administered 2012-03-19 (×2): via INTRAVENOUS

## 2012-03-19 MED ORDER — SODIUM CHLORIDE 0.9 % IJ SOLN: 3.0000 mL | INTRAMUSCULAR | Status: DC | PRN

## 2012-03-19 MED ORDER — ACETAMINOPHEN 10 MG/ML IV SOLN
INTRAVENOUS | Status: DC | PRN
  Administered 2012-03-19: 1000 mg via INTRAVENOUS

## 2012-03-19 MED ORDER — SCOPOLAMINE 1 MG/3DAYS TD PT72
MEDICATED_PATCH | TRANSDERMAL | Status: DC | PRN
  Administered 2012-03-19: 1 via TRANSDERMAL

## 2012-03-19 MED ORDER — HYDROMORPHONE HCL PF 1 MG/ML IJ SOLN
INTRAMUSCULAR | Status: AC
  Filled 2012-03-19: qty 1

## 2012-03-19 MED ORDER — SORBITOL 70 % SOLN
30.0000 mL | Freq: Every day | Status: DC | PRN
  Filled 2012-03-19: qty 30

## 2012-03-19 MED ORDER — METHOCARBAMOL 100 MG/ML IJ SOLN
500.0000 mg | Freq: Four times a day (QID) | INTRAVENOUS | Status: DC | PRN
  Filled 2012-03-19: qty 5

## 2012-03-19 MED ORDER — DEXAMETHASONE SODIUM PHOSPHATE 10 MG/ML IJ SOLN
INTRAMUSCULAR | Status: DC | PRN
  Administered 2012-03-19: 8 mg via INTRAVENOUS

## 2012-03-19 MED ORDER — DEXAMETHASONE SODIUM PHOSPHATE 10 MG/ML IJ SOLN
10.0000 mg | Freq: Once | INTRAMUSCULAR | Status: AC
  Administered 2012-03-19: 10 mg via INTRAVENOUS
  Filled 2012-03-19: qty 1

## 2012-03-19 MED ORDER — BUPIVACAINE-EPINEPHRINE PF 0.25-1:200000 % IJ SOLN
INTRAMUSCULAR | Status: AC
  Filled 2012-03-19: qty 30

## 2012-03-19 MED ORDER — THROMBIN 20000 UNITS EX SOLR: CUTANEOUS | Status: DC | PRN

## 2012-03-19 MED ORDER — KETOROLAC TROMETHAMINE 30 MG/ML IJ SOLN
INTRAMUSCULAR | Status: AC
  Filled 2012-03-19: qty 1

## 2012-03-19 MED ORDER — SODIUM CHLORIDE 0.9 % IJ SOLN
3.0000 mL | Freq: Two times a day (BID) | INTRAMUSCULAR | Status: DC
  Administered 2012-03-19 – 2012-03-22 (×6): 3 mL via INTRAVENOUS

## 2012-03-19 MED ORDER — THROMBIN 20000 UNITS EX SOLR
CUTANEOUS | Status: AC
  Filled 2012-03-19: qty 20000

## 2012-03-19 MED ORDER — METHOCARBAMOL 500 MG PO TABS
500.0000 mg | ORAL_TABLET | Freq: Four times a day (QID) | ORAL | Status: DC | PRN
  Administered 2012-03-19 – 2012-03-22 (×7): 500 mg via ORAL
  Filled 2012-03-19 (×6): qty 1

## 2012-03-19 MED ORDER — ACETAMINOPHEN 10 MG/ML IV SOLN
INTRAVENOUS | Status: AC
  Filled 2012-03-19: qty 100

## 2012-03-19 MED ORDER — ONDANSETRON HCL 4 MG/2ML IJ SOLN
INTRAMUSCULAR | Status: DC | PRN
  Administered 2012-03-19 (×2): 4 mg via INTRAVENOUS

## 2012-03-19 MED ORDER — TOPIRAMATE 25 MG PO TABS
150.0000 mg | ORAL_TABLET | Freq: Every morning | ORAL | Status: DC
  Administered 2012-03-19 – 2012-03-23 (×5): 150 mg via ORAL
  Filled 2012-03-19 (×5): qty 2

## 2012-03-19 MED ORDER — HYDROMORPHONE HCL PF 1 MG/ML IJ SOLN
0.2500 mg | INTRAMUSCULAR | Status: DC | PRN
  Administered 2012-03-19: 0.25 mg via INTRAVENOUS
  Administered 2012-03-19: 0.5 mg via INTRAVENOUS
  Administered 2012-03-19: 0.25 mg via INTRAVENOUS

## 2012-03-19 MED ORDER — ONDANSETRON HCL 4 MG/2ML IJ SOLN
INTRAMUSCULAR | Status: AC
  Administered 2012-03-19: 4 mg
  Filled 2012-03-19: qty 2

## 2012-03-19 MED ORDER — BUPIVACAINE-EPINEPHRINE 0.5% -1:200000 IJ SOLN
INTRAMUSCULAR | Status: DC | PRN
  Administered 2012-03-19: 17 mL

## 2012-03-19 MED ORDER — SODIUM CHLORIDE 0.9 % IV SOLN: 250.0000 mL | INTRAVENOUS | Status: DC

## 2012-03-19 MED ORDER — ESCITALOPRAM OXALATE 20 MG PO TABS
20.0000 mg | ORAL_TABLET | Freq: Every day | ORAL | Status: DC
  Administered 2012-03-19 – 2012-03-23 (×5): 20 mg via ORAL
  Filled 2012-03-19 (×5): qty 1

## 2012-03-19 MED ORDER — FENTANYL CITRATE 0.05 MG/ML IJ SOLN
INTRAMUSCULAR | Status: DC | PRN
  Administered 2012-03-19: 150 ug via INTRAVENOUS
  Administered 2012-03-19 (×2): 50 ug via INTRAVENOUS

## 2012-03-19 SURGICAL SUPPLY — 52 items
BUR RND FLUTED 2.5 (BURR) IMPLANT
BUR ROUND FLUTED 4 SOFT TCH (BURR) IMPLANT
BUR SABER RD CUTTING 3.0 (BURR) ×2 IMPLANT
CANISTER SUCTION 2500CC (MISCELLANEOUS) ×2 IMPLANT
CLOTH BEACON ORANGE TIMEOUT ST (SAFETY) ×2 IMPLANT
CORDS BIPOLAR (ELECTRODE) ×2 IMPLANT
COVER SURGICAL LIGHT HANDLE (MISCELLANEOUS) ×2 IMPLANT
DERMABOND ADVANCED (GAUZE/BANDAGES/DRESSINGS) ×1
DERMABOND ADVANCED .7 DNX12 (GAUZE/BANDAGES/DRESSINGS) ×1 IMPLANT
DRAPE C-ARM 42X72 X-RAY (DRAPES) ×2 IMPLANT
DRAPE MICROSCOPE LEICA (MISCELLANEOUS) ×2 IMPLANT
DRAPE POUCH INSTRU U-SHP 10X18 (DRAPES) ×2 IMPLANT
DRAPE PROXIMA HALF (DRAPES) IMPLANT
DRAPE SURG 17X23 STRL (DRAPES) ×8 IMPLANT
DRSG MEPILEX BORDER 4X4 (GAUZE/BANDAGES/DRESSINGS) ×4 IMPLANT
DRSG MEPILEX BORDER 4X8 (GAUZE/BANDAGES/DRESSINGS) IMPLANT
DURAPREP 26ML APPLICATOR (WOUND CARE) ×2 IMPLANT
ELECT BLADE 4.0 EZ CLEAN MEGAD (MISCELLANEOUS) ×2
ELECT CAUTERY BLADE 6.4 (BLADE) ×2 IMPLANT
ELECT REM PT RETURN 9FT ADLT (ELECTROSURGICAL) ×2
ELECTRODE BLDE 4.0 EZ CLN MEGD (MISCELLANEOUS) ×1 IMPLANT
ELECTRODE REM PT RTRN 9FT ADLT (ELECTROSURGICAL) ×1 IMPLANT
GLOVE BIOGEL PI IND STRL 7.5 (GLOVE) ×1 IMPLANT
GLOVE BIOGEL PI INDICATOR 7.5 (GLOVE) ×1
GLOVE ECLIPSE 7.0 STRL STRAW (GLOVE) ×2 IMPLANT
GLOVE ECLIPSE 8.5 STRL (GLOVE) ×2 IMPLANT
GLOVE SURG 8.5 LATEX PF (GLOVE) ×2 IMPLANT
GOWN PREVENTION PLUS LG XLONG (DISPOSABLE) ×2 IMPLANT
GOWN PREVENTION PLUS XXLARGE (GOWN DISPOSABLE) ×2 IMPLANT
GOWN STRL NON-REIN LRG LVL3 (GOWN DISPOSABLE) ×4 IMPLANT
KIT BASIN OR (CUSTOM PROCEDURE TRAY) ×2 IMPLANT
KIT ROOM TURNOVER OR (KITS) ×2 IMPLANT
NEEDLE 22X1 1/2 (OR ONLY) (NEEDLE) ×2 IMPLANT
NEEDLE SPNL 18GX3.5 QUINCKE PK (NEEDLE) ×4 IMPLANT
NS IRRIG 1000ML POUR BTL (IV SOLUTION) ×2 IMPLANT
PACK LAMINECTOMY ORTHO (CUSTOM PROCEDURE TRAY) ×2 IMPLANT
PAD ARMBOARD 7.5X6 YLW CONV (MISCELLANEOUS) ×4 IMPLANT
PATTIES SURGICAL .5 X.5 (GAUZE/BANDAGES/DRESSINGS) IMPLANT
PATTIES SURGICAL .75X.75 (GAUZE/BANDAGES/DRESSINGS) IMPLANT
SPONGE LAP 4X18 X RAY DECT (DISPOSABLE) IMPLANT
SPONGE SURGIFOAM ABS GEL 100 (HEMOSTASIS) ×2 IMPLANT
SUT VIC AB 1 CT1 27 (SUTURE) ×1
SUT VIC AB 1 CT1 27XBRD ANBCTR (SUTURE) ×1 IMPLANT
SUT VIC AB 2-0 CT1 27 (SUTURE) ×1
SUT VIC AB 2-0 CT1 TAPERPNT 27 (SUTURE) ×1 IMPLANT
SUT VICRYL 0 UR6 27IN ABS (SUTURE) ×2 IMPLANT
SUT VICRYL 4-0 PS2 18IN ABS (SUTURE) ×2 IMPLANT
SYR CONTROL 10ML LL (SYRINGE) ×2 IMPLANT
TOWEL OR 17X24 6PK STRL BLUE (TOWEL DISPOSABLE) ×2 IMPLANT
TOWEL OR 17X26 10 PK STRL BLUE (TOWEL DISPOSABLE) ×2 IMPLANT
TRAY FOLEY CATH 14FR (SET/KITS/TRAYS/PACK) ×2 IMPLANT
WATER STERILE IRR 1000ML POUR (IV SOLUTION) ×2 IMPLANT

## 2012-03-19 NOTE — Care Management Note (Signed)
    Page 1 of 2   03/23/2012     5:10:05 PM   CARE MANAGEMENT NOTE 03/23/2012  Patient:  [name] [name], [name] [name]   Account Number:  [id number]  Date Initiated:  03/19/2012  Documentation initiated by:  Onnie Boer  Subjective/Objective Assessment:   PT WAS ADMITTED FOR SURGERY     Action/Plan:   PROGRESSION OF CARE AND DISCHARGE PLANNING   Anticipated DC Date:  03/21/2012   Anticipated DC Plan:  HOME W HOME HEALTH SERVICES         Choice offered to / List presented to:  C-1 Patient   DME arranged  WALKER - ROLLING  3-N-1      DME agency  [facility]     HH arranged  HH-2 PT  HH-3 OT      [facility] agency  [facility]   Status of service:  Completed, signed off Medicare Important Message given?   (If response is "NO", the following Medicare IM given date fields will be blank) Date Medicare IM given:   Date Additional Medicare IM given:    Discharge Disposition:  HOME W HOME HEALTH SERVICES  Per UR Regulation:  Reviewed for med. necessity/level of care/duration of stay  If discussed at Long Length of Stay Meetings, dates discussed:    Comments:  03/23/12 Spoke with patient about [facility] for HHPT and OT. She chose [facility]. Contacted Debbie at [facility] and requested [facility] PT and OT. Contacted Chrystal at [facility] and requested rolling walker and 3 in 1 be delivered to patient's room this pm prior to d/c. Deatra Canter RN, BSN, CCM    03/19/12 Onnie Boer, RN, BSN 1414 PT WAS ADMITTED FOR A LAMINECTOMY.  WILL F/U ON DC NEEDS

## 2012-03-19 NOTE — Progress Notes (Signed)
ANTIBIOTIC CONSULT NOTE - INITIAL  Pharmacy Consult for Vancomycin x 1 dose post-op Indication: surgical prophylaxis, s/p lumbar disc surgery  Allergies  Allergen Reactions  . Penicillins Anaphylaxis  . Sulfa Antibiotics Hives and Other (See Comments)    Hives, mouth blisters   . Ciprofloxacin Hives  . Phenergan (Promethazine Hcl) Other (See Comments)    Caused "severe drop in blood pressure."    Patient Measurements:   Height: 69.5 inches   Weight:  79.4 kg  Vital Signs: Temp: 97.6 F (36.4 C) (06/24 1213) Temp src: Oral (06/24 0553) BP: 114/78 mmHg (06/24 1231) Pulse Rate: 73  (06/24 1231)  Labs: pre-op chemistries 03/09/12    Scr = 0.88    Estimated creatinine clearance 80-90 ml/min  Assessment:  Penicillin allergy, received Vancomycin 1 gram IV pre-op at 7:40am today.  No drain, for 1 dose of Vancomycin about 12 hrs post-op.  Goal of Therapy:  Vancomycin trough level 10-15 mcg/ml  Plan:    Vancomycin 1 gram IV tonight at 8pm.   No Rx follow-up planned.   Microbiology: Recent Results (from the past 720 hour(s))  SURGICAL PCR SCREEN     Status: Normal   Collection Time   03/09/12 12:35 PM      Component Value Range Status Comment   MRSA, PCR NEGATIVE  NEGATIVE Final    Staphylococcus aureus NEGATIVE  NEGATIVE Final     Medical History: Past Medical History  Diagnosis Date  . PONV (postoperative nausea and vomiting)     last 2 surgeries less nausea  . Hypertension   . Heart murmur     years ago  . Headache     migaines  . Arthritis   . Blood dyscrasia     hx o blood clots after ankle surgery    Marya Landry Pager: [phone] 03/19/2012,12:56 PM

## 2012-03-19 NOTE — Brief Op Note (Signed)
03/19/2012  10:45 AM  PATIENT:  [name] [name]  46 y.o. 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 Operating microscope used for this procedure )  SURGEON:  Surgeon(s) and Role:    Kerrin Champagne, MD - Primary  PHYSICIAN ASSISTANT: Maud Deed PA-C  ANESTHESIA:   local and general , Dr.Singer.  EBL:  Total I/O In: 1500 [I.V.:1500] Out: 475 [Urine:400; Blood:75]  BLOOD ADMINISTERED:none  DRAINS: Urinary Catheter (Foley)   LOCAL MEDICATIONS USED:  MARCAINE    and Amount: 15 ml  SPECIMEN:  No Specimen  DISPOSITION OF SPECIMEN:  N/A  COUNTS:  YES  TOURNIQUET:  * No tourniquets in log *  DICTATION: .Dragon Dictation  PLAN OF CARE: Admit for overnight observation  PATIENT DISPOSITION:  PACU - hemodynamically stable.   Delay start of Pharmacological VTE agent (>24hrs) due to surgical blood loss or risk of bleeding: yes

## 2012-03-19 NOTE — Transfer of Care (Signed)
Immediate Anesthesia Transfer of Care Note  Patient: [name] [name]  Procedure(s) Performed: Procedure(s) (LRB): MICRODISCECTOMY LUMBAR LAMINECTOMY (Right)  Patient Location: PACU  Anesthesia Type: General  Level of Consciousness: awake, oriented and patient cooperative  Airway & Oxygen Therapy: Patient Spontanous Breathing and Patient connected to nasal cannula oxygen  Post-op Assessment: Report given to PACU RN and Post -op Vital signs reviewed and stable  Post vital signs: Reviewed  Complications: No apparent anesthesia complications

## 2012-03-19 NOTE — Progress Notes (Signed)
Paged Dr. Otelia Sergeant for the second time.

## 2012-03-19 NOTE — H&P (Addendum)
[name] [name] is an 46 y.o. female.   Chief Complaint:Back and right leg HPI: 46 year old female with several year history of recurrign back and right lower extremity pain weakness and numbness. Worsening the last 2 months. No bowel or bladder symptoms recently. Unable to walk, bending and sitting worsen the pain.  MRI with right lateral recess stenosis with HNP right L4-5. Fragmentation of the right L3-4 superior articular process with lateral recessw stenosis Right L3-4. Requiring narcotic medications to be comfortable. Night pain and difficulty with sleep due to pain. Unable to drive comfortably.   Past Medical History  Diagnosis Date  . PONV (postoperative nausea and vomiting)     last 2 surgeries less nausea  . Hypertension   . Heart murmur     years ago  . Headache     migaines  . Arthritis   . Blood dyscrasia     hx o blood clots after ankle surgery    Past Surgical History  Procedure Date  . Breast surgery     left breast  . Tonsillectomy   . Abdominal hysterectomy     1995 have Ovaries  . Cholecystectomy   . Diagnostic laparoscopy   . Ankle surgery     2010 right ankle  . Melanoma excision 06/2011    right lower leg  . Bladder tack     History reviewed. No pertinent family history. Social History:  reports that she has never smoked. She has never used smokeless tobacco. She reports that she drinks alcohol. She reports that she does not use illicit drugs.  Allergies:  Allergies  Allergen Reactions  . Penicillins Anaphylaxis  . Sulfa Antibiotics Hives and Other (See Comments)    Hives, mouth blisters   . Ciprofloxacin Hives  . Phenergan (Promethazine Hcl) Other (See Comments)    Caused "severe drop in blood pressure."    Medications Prior to Admission  Medication Sig Dispense Refill  . escitalopram (LEXAPRO) 20 MG tablet Take 20 mg by mouth daily.      . hydrochlorothiazide (HYDRODIURIL) 25 MG tablet Take 25 mg by mouth daily.      Marland Kitchen HYDROmorphone  (DILAUDID) 2 MG tablet Take 2-4 mg by mouth every 6 (six) hours as needed. For pain.      Marland Kitchen morphine (MSIR) 15 MG tablet Take 15-30 mg by mouth every 6 (six) hours as needed. For pain.      Marland Kitchen oxyCODONE-acetaminophen (PERCOCET) 10-325 MG per tablet Take 1 tablet by mouth every 6 (six) hours as needed. For pain.      . rosuvastatin (CRESTOR) 10 MG tablet Take 10 mg by mouth daily.      Marland Kitchen topiramate (TOPAMAX) 100 MG tablet Take 150 mg by mouth every morning. For migraines.        No results found for this or any previous visit (from the past 48 hour(s)). No results found.  Review of Systems  Constitutional: Negative.   HENT: Negative.  Negative for neck pain.   Eyes: Negative.   Respiratory: Negative.   Cardiovascular: Negative.   Gastrointestinal: Negative.   Genitourinary: Negative.   Musculoskeletal: Positive for back pain. Negative for myalgias, joint pain and falls.  Skin: Negative.   Neurological: Positive for tingling, sensory change and focal weakness. Negative for dizziness, tremors, speech change, seizures and loss of consciousness.  Endo/Heme/Allergies: Negative.   Psychiatric/Behavioral: Negative.  Negative for memory loss.    Blood pressure 114/77, pulse 86, temperature 98.9 F (37.2 C), temperature source  Oral, resp. rate 18, SpO2 97.00%. Physical Exam  Constitutional: She is oriented to person, place, and time. She appears well-developed and well-nourished. No distress.  HENT:  Head: Normocephalic and atraumatic.  Right Ear: External ear normal.  Nose: Nose normal.  Mouth/Throat: Oropharynx is clear and moist. No oropharyngeal exudate.  Eyes: EOM are normal. Pupils are equal, round, and reactive to light. Right eye exhibits no discharge. Left eye exhibits no discharge. No scleral icterus.  Neck: Normal range of motion. Neck supple. No JVD present. No tracheal deviation present. No thyromegaly present.  Cardiovascular: Normal rate, regular rhythm, normal heart sounds  and intact distal pulses.  Exam reveals no gallop and no friction rub.   No murmur heard. Respiratory: Effort normal and breath sounds normal. No respiratory distress. She has no wheezes. She has no rales. She exhibits no tenderness.  GI: Soft. Bowel sounds are normal. She exhibits no distension and no mass. There is no tenderness. There is no rebound and no guarding.  Musculoskeletal: She exhibits no edema and no tenderness.  Lymphadenopathy:    She has no cervical adenopathy.  Neurological: She is alert and oriented to person, place, and time. She has normal reflexes. She displays normal reflexes. No cranial nerve deficit. She exhibits abnormal muscle tone. Coordination normal.  Skin: Skin is warm and dry. No rash noted. She is not diaphoretic. No erythema. No pallor.  Psychiatric: She has a normal mood and affect. Her behavior is normal. Judgment and thought content normal.    Orthopaedic Evaluation:  Awake alert Oriented x 4. Positive SLR right . Weak right foot Dorsiflexion and difficulty with heel walking. Strength right leg foot Dorsiflexion is 4/5. Plantar flexion is normal. myelogram and post myelogram CT scan right lateral recess stenosis L3-4 and Right L4-5. Small HNP right L4-5 worsens the stenosis affecting the right L5 nerve root.   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 HNP right L4-5.  Plan: Microdiscectomy right L4-5  And lateral recess decompression L3-4 and L4-5.  Moorea Boissonneault E 03/19/2012, 7:32 AM

## 2012-03-19 NOTE — Progress Notes (Signed)
Phone call from nursing receiving vancomycin began experiencing swelling and arm redness then pruritis generalized. Start benadryl and give 10 mg of decadron For allergic reaction to vancomycin.

## 2012-03-19 NOTE — Anesthesia Preprocedure Evaluation (Addendum)
Anesthesia Evaluation  Patient identified by MRN, date of birth, ID band Patient awake    Reviewed: Allergy & Precautions, H&P , NPO status , Patient's Chart, lab work & pertinent test results, reviewed documented beta blocker date and time   History of Anesthesia Complications (+) PONV  Airway Mallampati: II TM Distance: >3 FB Neck ROM: Full    Dental  (+) Teeth Intact and Dental Advisory Given   Pulmonary neg pulmonary ROS,  breath sounds clear to auscultation  Pulmonary exam normal       Cardiovascular hypertension, Pt. on medications negative cardio ROS  Rhythm:Regular Rate:Normal     Neuro/Psych  Headaches, Depression    GI/Hepatic negative GI ROS, Neg liver ROS,   Endo/Other    Renal/GU negative Renal ROS     Musculoskeletal negative musculoskeletal ROS (+)   Abdominal   Peds  Hematology   Anesthesia Other Findings   Reproductive/Obstetrics                          Anesthesia Physical Anesthesia Plan  ASA: II  Anesthesia Plan: General   Post-op Pain Management:    Induction: Intravenous  Airway Management Planned: Oral ETT  Additional Equipment:   Intra-op Plan:   Post-operative Plan: Extubation in OR  Informed Consent: I have reviewed the patients History and Physical, chart, labs and discussed the procedure including the risks, benefits and alternatives for the proposed anesthesia with the patient or authorized representative who has indicated his/her understanding and acceptance.   Dental advisory given  Plan Discussed with: Anesthesiologist  Anesthesia Plan Comments:         Anesthesia Quick Evaluation

## 2012-03-19 NOTE — Preoperative (Signed)
Beta Blockers   Reason not to administer Beta Blockers:Not Applicable 

## 2012-03-19 NOTE — Op Note (Signed)
03/19/2012  10:38 AM  PATIENT:  [name] [name]  46 y.o. 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 Operating microscope used for this procedure.    SURGEON:  Kerrin Champagne, MD     ASSISTANT:  Maud Deed, PA-C  (Present throughout the entire procedure and necessary for completion of procedure in a timely manner)     ANESTHESIA:  General, Dr. Krista Blue. Supplemented with local marcaine 0.25% with epi. 1/200,000 15cc.    COMPLICATIONS:  None.    EBL: 50 cc   PROCEDURE:The patient was met in the holding area, and the appropriate Right Lumbar level L3-4 and L4-5 identified and marked with "x" and my initials.The patient was then transported to OR and was placed under general anesthesia without difficulty. The patient received appropriate preoperative antibiotic prophylaxis. Foley catheter was placed sterilely. The patient after intubation atraumatically was transferred to the operating room table, prone position, Wilson frame, sliding OR table. All pressure points were well padded. The arms in 90-90 well-padded at the elbows. Standard prep with DuraPrep solution lower dorsal spine to the mid sacral segment. Draped in the usual manner iodine Vi-Drape was used. Time-out procedure was called and correct. 2x 18-gauge spinal needle was then inserted at the expected L4 level. C-arm was draped sterilely to the field and used to identify the spinal needles positions. The needle was at the lower aspect of the lamina of L4 Skin superior to this was then infiltrated with Marcaine half percent with 1-200,000 epinephrine total of 15 cc used. An incision approximately an inch inch and a  half in length was then made through skin and subcutaneous layers in line with the right side of the expected midline just superior to the spinal needle entry point. An incision made into the right lumbosacral fascia approximately an inch in length .  Smallest dilator was then introduced into the incision site and used to carefully form subperiosteal movement of the hip paralumbar muscles off of the posterior lamina of the expected L4-5 level. Successive dilators were then carried up to the 11 mm size. The depth measured off of the dilators at about 60 mm and 60 mm retractors and placed on the scaffolding for the MIS equipment and guided over dilators down to and docking on the posterior aspect of the lamina at the expected L4-5 level. This was sterilely attached to the articulating arm and it's up right which had been attached the OR table sterilely. C-arm fluoroscopy was identified the dilators and the retractors at the appropriate level L4-5. The operating room microscope sterilely draped brought into the field. Under the operating room microscope, the L4-5 interspace carefully debrided the small amount of muscle attachment here and high-speed bur used to drill the medial aspect of the inferior articular process of L4 approximately 10%. A localization lateral C-arm view was obtained with Penfield 4 in the L4-5 facet. 2 mm Kerrison then used to enter the spinal canal over the superior aspect of the L5 lamina carefully using the Kerrison to debris the attachment as a curet. Foraminotomy was then performed over the L5 nerve root. The medial 10% superior articular process of L5 and then resected using an osteotome and 2 mm Kerrison. This allowed  for identification of the thecal sac. Penfield 4 was then used to carefully mobilize the thecal sac medially and the L5 nerve root identified within the lateral recess flattened over the posterior aspect of the herniated disc. Carefully the lateral aspect of the L5 nerve  root was identified and a Penfield 4 was used to mobilize the nerve medially such that the herniated disc was visible with microscope. Using a Penfield 4 for retraction and a 15 blade scalpel was used to incise the posterior longitudinal ligament within the lateral recess on the left side longitudinally. Disc material immediately extruded and this was removed using micropituitary rongeurs and nerve hook nerve root and then more easily able to be mobilized medially and retracted using a love retractor. Further foraminotomies was performed over the L4 nerve root the nerve root was noted to be decompressed. The nerve root able to be retracted along the medial aspect of the L5 pedicle and disc material found to be subligamentous at this level was further resected current pituitary rongeurs.  Ligamentum flavum was further debrided superiorly to the level L4-5 disc. Had a moderate amount of further resection of the L5 lamina inferiorly was performed. With this then the disc space at L4-5 was easily visualized and entry into the disc at the sided disc herniation was possible using a Penfield 4 intraoperative Lateral radiograph was used to identify the L4-5 disc with the Penfield 4 In place just below the disc space. Micropituitary was used to further debride this material superficially from the posterior aspect of the intervertebral disc is posterior lateral aspect of the disc. Small amount of further disc material was found subligamentous extending inferiorly from the disc this was removed using micropituitary rongeurs. Ligamentum flavum was debrided and lateral recess along the medial aspect L4-5 facet no further decompression was necessary. Ball tip nerve probe was then able to carefully palpate the neuroforamen for L4 and L5 finding these to be well decompressed. Attention then turned to the right L4-5 level which was easily visualized with the microscope. Soft tissues debrided about the posterior aspect of the L3-4  interspace. High-speed bur and then used to carefully drill inferior 3 or 4 mm of the right side L3 lamina and on the medial aspect of the right L3 inferior articular process of 3 mm. The superior margin of the L4 lamina then carefully debrided with curette and a 2 mm kerrison then used to enter the spinal canal over the superior aspect of the L4 lamina resecting bone over the superior aspect and freeing up the attachment of ligamentum flavum here. Ligamentum flavum then debrided with the 2 mm and 3 mm Kerrisons we decompressed the L4 nerve root and the lateral recess right L3-4 decompressed using 2 and 3 mm Kerrisons sizing hypertrophic reflected ligamentum flavum extending superiorly. From was resected off the ventral aspect of the inferior margin of the L3 lamina. Hockey-stick nerve probe could then be passed out the L3 neuroforamen and the L4 neuroforamen. Venous bleeding encountered. Thrombin-soaked Gelfoam used to control this following this then the sac and the L5 nerve root were mobilized medially and the L3-4 disc examined and found not to be herniated. Irrigation was carried out down to this bleeding controlled with Gelfoam. Gelfoam was then removed. Irrigation carried careful examination demonstrated no active bleeding present. Retractors were then carefully removed Since carefully then the Bleeding was then controlled using thrombin-soaked Gelfoam small cottonoids. Small amount of bleeding within the soft tissue mass the laminotomy area was controlled using  bipolar electrocautery. Irrigation was carried out using copious amounts of irrigant solution. All Gelfoam were then removed. No significant active bleeding present at the time of removal. All instruments sponge counts were correct traction system was then carefully removed carefully rotating retractors with this withdrawal and only bipolar electrocautery of any small bleeders. Lumbodorsal fascia was then carefully approximated with interrupted 0  Vicryl sutures, UR 6 needle deep subcutaneous layers were approximated with interrupted 0 Vicryl sutures on UR 6 the appear subcutaneous layers approximated with interrupted 2-0 Vicryl sutures and the skin closed with a running subcutaneous stitch of 4-0 Vicryl. Dermabond was applied allowed to dry and then Mepilex bandage applied. Patient was then carefully returned to supine position on a stretcher, reactivated and extubated. He was then returned to recovery room in satisfactory condition.  Maud Deed PA-C perform the duties of assistant surgeon during this case. She was present from the beginning of the case to the end of the case assisting in transfer the patient from his stretcher to the OR table and back to the stretcher at the end of the case. Assisted in careful retraction and suction of the laminectomy site delicate neural structures operating under the operating room microscope. She performed closure of the incision from the fascia to the skin applying the dressing.      Braxden Lovering E  03/19/2012, 10:38 AM

## 2012-03-19 NOTE — Anesthesia Postprocedure Evaluation (Signed)
Anesthesia Post Note  Patient: [name] [name]  Procedure(s) Performed: Procedure(s) (LRB): MICRODISCECTOMY LUMBAR LAMINECTOMY (Right)  Anesthesia type: general  Patient location: PACU  Post pain: Pain level controlled  Post assessment: Patient's Cardiovascular Status Stable  Last Vitals:  Filed Vitals:   03/19/12 1101  BP: 111/65  Pulse:   Temp: 37.1 C  Resp:     Post vital signs: Reviewed and stable  Level of consciousness: sedated  Complications: No apparent anesthesia complications

## 2012-03-19 NOTE — Anesthesia Procedure Notes (Signed)
Procedure Name: Intubation Date/Time: 03/19/2012 8:01 AM Performed by: Lovie Chol Pre-anesthesia Checklist: Patient identified, Emergency Drugs available, Suction available, Patient being monitored and Timeout performed Patient Re-evaluated:Patient Re-evaluated prior to inductionOxygen Delivery Method: Circle system utilized Preoxygenation: Pre-oxygenation with 100% oxygen Intubation Type: IV induction Ventilation: Mask ventilation without difficulty Laryngoscope Size: Miller and 2 Grade View: Grade I Tube type: Oral Tube size: 7.5 mm Number of attempts: 1 Airway Equipment and Method: Stylet Placement Confirmation: ETT inserted through vocal cords under direct vision,  positive ETCO2 and breath sounds checked- equal and bilateral Secured at: 21 cm Tube secured with: Tape Dental Injury: Teeth and Oropharynx as per pre-operative assessment

## 2012-03-20 NOTE — Progress Notes (Signed)
Patient receiving IV vancomycin, began experiencing pruritis.  This RN stopped the infusion and paged Dr. Otelia Sergeant for further instruction/ orders.

## 2012-03-20 NOTE — Evaluation (Signed)
Occupational Therapy Evaluation Patient Details Name: [name] [name] MRN: [id number] DOB: [birth year] Today's Date: 03/20/2012 Time: 7829-5621 OT Time Calculation (min): 20 min  OT Assessment / Plan / Recommendation Clinical Impression  Pt admitted for extensive lumbar laminectomy and presents with pain, LLE weakness and overall decreased independence with ADL. Pt will benefit from skilled OT in the acute setting to maximize I with ADL and ADL mobility prior to d/c. May benefit from CIR pending progress    OT Assessment  Patient needs continued OT Services    Follow Up Recommendations  Inpatient Rehab (vs HHOT)    Barriers to Discharge      Equipment Recommendations  Defer to next venue    Recommendations for Other Services Rehab consult  Frequency  Min 2X/week    Precautions / Restrictions Precautions Precautions: Back;Fall Precaution Booklet Issued: No Restrictions Weight Bearing Restrictions: No   Pertinent Vitals/Pain Pt reported increasing pain from 5/10 after sitting on EOB and then standing to ambulation. Did not give rating at end of session. Pt pre-medicated and repositioned    ADL  Eating/Feeding: Simulated;Independent Where Assessed - Eating/Feeding: Edge of bed Grooming: Simulated;Minimal assistance Where Assessed - Grooming: Supported standing Upper Body Bathing: Simulated;Minimal assistance Where Assessed - Upper Body Bathing: Unsupported sitting Lower Body Bathing: Simulated;+1 Total assistance Where Assessed - Lower Body Bathing: Supported sit to stand Upper Body Dressing: Simulated;Minimal assistance Where Assessed - Upper Body Dressing: Unsupported sitting Lower Body Dressing: Simulated;+1 Total assistance Where Assessed - Lower Body Dressing: Supported sit to Pharmacist, hospital: Simulated;Minimal assistance Toilet Transfer Method: Sit to stand Transfers/Ambulation Related to ADLs: Pt with extensive pain with ambulation- Mod A with RW ambulation,  pt with mild Rt knee buckling ADL Comments: limited by pain    OT Diagnosis: Generalized weakness;Acute pain  OT Problem List: Decreased strength;Decreased activity tolerance;Impaired balance (sitting and/or standing);Decreased knowledge of use of DME or AE;Pain;Decreased knowledge of precautions OT Treatment Interventions: Self-care/ADL training;DME and/or AE instruction;Therapeutic activities;Patient/family education;Balance training   OT Goals Acute Rehab OT Goals OT Goal Formulation: With patient Time For Goal Achievement: 04/03/12 Potential to Achieve Goals: Good ADL Goals Pt Will Perform Grooming: with modified independence;Sitting at sink ADL Goal: Grooming - Progress: Goal set today Pt Will Perform Upper Body Dressing: with modified independence;Sitting, bed;Sitting, chair ADL Goal: Upper Body Dressing - Progress: Goal set today Pt Will Perform Lower Body Dressing: with adaptive equipment;Sit to stand from chair;Sit to stand from bed;with modified independence ADL Goal: Lower Body Dressing - Progress: Goal set today Pt Will Transfer to Toilet: with modified independence;Ambulation;with DME;3-in-1 ADL Goal: Toilet Transfer - Progress: Goal set today Pt Will Perform Toileting - Clothing Manipulation: with modified independence;Standing ADL Goal: Toileting - Clothing Manipulation - Progress: Goal set today Pt Will Perform Tub/Shower Transfer: Shower transfer;with supervision;Ambulation;with DME ADL Goal: Tub/Shower Transfer - Progress: Goal set today Additional ADL Goal #1: Pt will verbalize/generalize 3/3 back precautions for use with ADLs ADL Goal: Additional Goal #1 - Progress: Goal set today  Visit Information  Last OT Received On: 03/20/12 Assistance Needed: +2 PT/OT Co-Evaluation/Treatment: Yes    Subjective Data      Prior Functioning       Cognition  Overall Cognitive Status: Appears within functional limits for tasks assessed/performed Arousal/Alertness:  Awake/alert Orientation Level: Appears intact for tasks assessed Behavior During Session: [facility] for tasks performed    Extremity/Trunk Assessment Right Upper Extremity Assessment RUE ROM/Strength/Tone: Within functional levels RUE Sensation: WFL - Light Touch;WFL - Proprioception RUE Coordination:  WFL - gross/fine motor Left Upper Extremity Assessment LUE ROM/Strength/Tone: Within functional levels LUE Sensation: WFL - Light Touch;WFL - Proprioception LUE Coordination: WFL - gross/fine motor   Mobility Bed Mobility Bed Mobility: Rolling Left;Rolling Right;Left Sidelying to Sit;Sitting - Scoot to [place] of Bed;Sit to Sidelying Left Rolling Right: 4: Min guard Rolling Left: 4: Min guard Left Sidelying to Sit: 4: Min guard;HOB flat Sitting - Scoot to [place] of Bed: 4: Min guard Sit to Sidelying Left: HOB flat Details for Bed Mobility Assistance: verbal sequencing cues step by step for reduced twisting Transfers Sit to Stand: 4: Min assist;With upper extremity assist;From bed;From elevated surface Stand to Sit: With upper extremity assist;To bed;4: Min assist Details for Transfer Assistance: cues for positioning and ease of sit->stand, needing HHA immediately upon standing to stabilize; min facilitation around her trunk to controll descent to bed, very fearful with sitting   Exercise    Balance    End of Session OT - End of Session Equipment Utilized During Treatment: Gait belt Activity Tolerance: Patient limited by pain Patient left: in bed;with call bell/phone within reach;with family/visitor present  GO     Tracy Kinner 03/20/2012, 5:18 PM

## 2012-03-20 NOTE — Progress Notes (Signed)
Per order, gave 50 mg of benadryl and 10 mg decadron.  Patient reports pruritis is subsiding.  Patient's vitals sign wnl and she is resting comfortably.  Will continue to monitor.

## 2012-03-20 NOTE — Evaluation (Signed)
Physical Therapy Evaluation Patient Details Name: [name] [name] MRN: [id number] DOB: [birth year] Today's Date: 03/20/2012 Time: 4098-1191 PT Time Calculation (min): 41 min  PT Assessment / Plan / Recommendation Clinical Impression  [name] [name] is 46 y/o female s/p microdiscectomy lumbar laminectomy L3-4, L4-5. Prior to surgery [name] [name] was having difficulty ambulating secondary to numbness and weakness on RLE. Presents to therapy today with continued RLE weakness as well as pain impairing her mobility and ambulation. Will benefit physical therapy in the acute setting to address these and the below deficits. Rec CIR for further gait training prior to d/c home. Encouraged ankle pumps and quad sets for improved strength on the RLE.     PT Assessment  Patient needs continued PT services    Follow Up Recommendations  Inpatient Rehab;Supervision for mobility/OOB    Barriers to Discharge        lEquipment Recommendations  Defer to next venue    Recommendations for Other Services Rehab consult   Frequency 7X/week    Precautions / Restrictions Precautions Precautions: Back;Fall Precaution Booklet Issued: No         Mobility  Bed Mobility Bed Mobility: Rolling Left;Rolling Right;Left Sidelying to Sit;Sitting - Scoot to [place] of Bed;Sit to Sidelying Left Rolling Right: 4: Min guard Rolling Left: 4: Min guard Left Sidelying to Sit: 4: Min guard;HOB flat Sitting - Scoot to [place] of Bed: 4: Min guard Sit to Sidelying Left: HOB flat Details for Bed Mobility Assistance: verbal sequencing cues step by step for reduced twisting Transfers Transfers: Sit to Stand;Stand to Sit Sit to Stand: 4: Min assist;With upper extremity assist;From bed;From elevated surface Stand to Sit: With upper extremity assist;To bed;4: Min assist Details for Transfer Assistance: cues for positioning and ease of sit->stand, needing HHA immediately upon standing to stabilize; min facilitation around her trunk  to controll descent to bed, very fearful with sitting Ambulation/Gait Ambulation/Gait Assistance: 3: Mod assist;4: Min assist Ambulation Distance (Feet): 12 Feet Assistive device: Rolling walker Ambulation/Gait Assistance Details: assist to weight shift and to bring RLE through, cues for upright posture and safe positioning with RW Gait Pattern: Step-to pattern;Trunk flexed;Decreased step length - right;Right genu recurvatum General Gait Details: foot drop on right, unable to clear foot, did buckle slightly but also with recurvatum to control knee on right    Exercises General Exercises - Lower Extremity Ankle Circles/Pumps: AROM;Both;10 reps;Supine Quad Sets: AROM;Right;10 reps;Supine   PT Diagnosis: Difficulty walking;Abnormality of gait;Generalized weakness;Acute pain  PT Problem List: Decreased strength;Decreased activity tolerance;Decreased balance;Decreased mobility;Decreased knowledge of use of DME;Pain PT Treatment Interventions: DME instruction;Gait training;Stair training;Functional mobility training;Therapeutic activities;Therapeutic exercise;Balance training;Neuromuscular re-education;Patient/family education   PT Goals Acute Rehab PT Goals PT Goal Formulation: With patient Time For Goal Achievement: 03/27/12 Pt will Roll Supine to Right Side: with modified independence PT Goal: Rolling Supine to Right Side - Progress: Goal set today Pt will Roll Supine to Left Side: with modified independence PT Goal: Rolling Supine to Left Side - Progress: Goal set today Pt will go Supine/Side to Sit: with modified independence;with HOB 0 degrees PT Goal: Supine/Side to Sit - Progress: Goal set today Pt will go Sit to Supine/Side: with modified independence;with HOB 0 degrees PT Goal: Sit to Supine/Side - Progress: Goal set today Pt will go Sit to Stand: with modified independence PT Goal: Sit to Stand - Progress: Goal set today Pt will go Stand to Sit: with modified independence PT  Goal: Stand to Sit - Progress: Goal set today Pt will Transfer  Bed to Chair/Chair to Bed: with supervision PT Transfer Goal: Bed to Chair/Chair to Bed - Progress: Goal set today Pt will Ambulate: 51 - 150 feet;with supervision;with rolling walker PT Goal: Ambulate - Progress: Goal set today Pt will Go Up / Down Stairs: 3-5 stairs;with min assist;with least restrictive assistive device PT Goal: Up/Down Stairs - Progress: Goal set today  Visit Information  Last PT Received On: 03/20/12 Assistance Needed: +2 (safety, R knee buckles and foot drop/drag) PT/OT Co-Evaluation/Treatment: Yes    Subjective Data  Subjective: I don't want to fall!   Prior Functioning  Home Living Lives With: Spouse Available Help at Discharge: Family (2 college daughters will help during the day) Type of Home: House Home Access: Stairs to enter Secretary/administrator of Steps: 3 Entrance Stairs-Rails: None Home Layout: One level Bathroom Shower/Tub: Health visitor: Standard Home Adaptive Equipment: None Prior Function Level of Independence: Independent Able to Take Stairs?: Yes Driving: Yes Vocation: Full time employment Communication Communication: No difficulties    Cognition  Overall Cognitive Status: Appears within functional limits for tasks assessed/performed Arousal/Alertness: Awake/alert Orientation Level: Appears intact for tasks assessed Behavior During Session: [facility] for tasks performed (a bit anxious about not being able to walk well)    Extremity/Trunk Assessment Right Upper Extremity Assessment RUE ROM/Strength/Tone: Within functional levels RUE Sensation: WFL - Light Touch;WFL - Proprioception RUE Coordination: WFL - gross/fine motor Left Upper Extremity Assessment LUE ROM/Strength/Tone: Within functional levels LUE Sensation: WFL - Light Touch;WFL - Proprioception LUE Coordination: WFL - gross/fine motor Right Lower Extremity Assessment RLE ROM/Strength/Tone:  Deficits RLE ROM/Strength/Tone Deficits: grossly 2-2+/3 DF and quad strength, buckling of knee and hyperextension at her knee during gait indicate quad weakness, pt with difficult quad set lying in bed RLE Sensation: WFL - Light Touch;WFL - Proprioception RLE Coordination: WFL - gross/fine motor Left Lower Extremity Assessment LLE ROM/Strength/Tone: WFL for tasks assessed LLE Sensation: WFL - Light Touch;WFL - Proprioception LLE Coordination: WFL - gross/fine motor   Balance    End of Session PT - End of Session Equipment Utilized During Treatment: Gait belt Activity Tolerance: Patient tolerated treatment well;Patient limited by fatigue;Patient limited by pain Patient left: in bed;with call bell/phone within reach;with family/visitor present Nurse Communication: Mobility status   WHITLOW,Dannielle Baskins HELEN 03/20/2012, 12:45 PM

## 2012-03-20 NOTE — Progress Notes (Signed)
Subjective: 1 Day Post-Op Procedure(s) (LRB): MICRODISCECTOMY LUMBAR LAMINECTOMY (Right) L3-4 and L4-5. Complains of discomfort in her back pain into her buttocks. Nausea yesterday following surgery reaction to vancomycin with redness the arm and pruritis. Notes that she would have difficulty if she went home and has not had physical therapy as yet. She is less than 24 hours postop and will need to be a well to walk stairs prior to discharge. Patient reports pain as 6 on 0-10 scale and 7 on 0-10 scale.    Objective: Vital signs in last 24 hours: Temp:  [97.5 F (36.4 C)-98.8 F (37.1 C)] 98.6 F (37 C) (06/25 0600) Pulse Rate:  [63-91] 63  (06/25 0600) Resp:  [10-19] 18  (06/25 0600) BP: (92-122)/(53-78) 99/65 mmHg (06/25 0600) SpO2:  [95 %-100 %] 97 % (06/25 0600) Weight:  [79.4 kg (175 lb 0.7 oz)] 79.4 kg (175 lb 0.7 oz) (06/24 1231)  Intake/Output from previous day: 06/24 0701 - 06/25 0700 In: 1940 [P.O.:240; I.V.:1700] Out: 4675 [Urine:4600; Blood:75] Intake/Output this shift:    No results found for this basename: HGB:5 in the last 72 hours No results found for this basename: WBC:2,RBC:2,HCT:2,PLT:2 in the last 72 hours No results found for this basename: NA:2,K:2,CL:2,CO2:2,BUN:2,CREATININE:2,GLUCOSE:2,CALCIUM:2 in the last 72 hours No results found for this basename: LABPT:2,INR:2 in the last 72 hours  Neurologically intact ABD soft Sensation intact distally Dorsiflexion/Plantar flexion intact Incision: no drainage No cellulitis present  Assessment/Plan: 1 Day Post-Op Procedure(s) (LRB): MICRODISCECTOMY LUMBAR LAMINECTOMY (Right)   Advance diet Up with therapy D/C IV fluids Plan for discharge tomorrow Discharge home with home health  [name] [name] E 03/20/2012, 9:57 AM

## 2012-03-21 DIAGNOSIS — IMO0002 Reserved for concepts with insufficient information to code with codable children: Secondary | ICD-10-CM

## 2012-03-21 DIAGNOSIS — M48061 Spinal stenosis, lumbar region without neurogenic claudication: Secondary | ICD-10-CM

## 2012-03-21 MED ORDER — POLYETHYLENE GLYCOL 3350 17 G PO PACK
17.0000 g | PACK | Freq: Every day | ORAL | Status: DC | PRN
  Administered 2012-03-22: 17 g via ORAL
  Filled 2012-03-21: qty 1

## 2012-03-21 MED ORDER — SODIUM CHLORIDE 0.9 % IV BOLUS (SEPSIS)
500.0000 mL | Freq: Once | INTRAVENOUS | Status: AC
  Administered 2012-03-21: 500 mL via INTRAVENOUS

## 2012-03-21 NOTE — Progress Notes (Signed)
Occupational Therapy Treatment Patient Details Name: DIANIA CO MRN: [id number] DOB: 1966-05-20 Today's Date: 03/21/2012 Time: 4098-1191 OT Time Calculation (min): 44 min  OT Assessment / Plan / Recommendation Comments on Treatment Session Pt. overall requires min A for ADLs.  Pt. continues with instabiilty of Rt LE and pain limits participation    Follow Up Recommendations  Inpatient Rehab;Supervision/Assistance - 24 hour (CIR denied - recommend HHOT)    Barriers to Discharge       Equipment Recommendations  3 in 1 bedside comode    Recommendations for Other Services    Frequency Min 2X/week   Plan      Precautions / Restrictions Precautions Precautions: Back;Fall Restrictions Weight Bearing Restrictions: No   Pertinent Vitals/Pain Pain 8/10    ADL  Lower Body Dressing: Performed;Minimal assistance Where Assessed - Lower Body Dressing: Supported sit to stand Toilet Transfer: Performed;Minimal assistance Toilet Transfer Method: Sit to Barista: Comfort height toilet;Grab bars Toileting - Architect and Hygiene: Performed;Minimal assistance Where Assessed - Engineer, mining and Hygiene: Standing Equipment Used: Sock aid;Reacher;Long-handled sponge;Long-handled shoe horn;Rolling walker Transfers/Ambulation Related to ADLs: Pt. requires min A for ambulation.  Pt. with Rt. LE buckling and instability ADL Comments: Pt. initially with pain 2/10, but increased to 8/10.  RN notified.  Pt. instructed in use of AE for LB ADLs.  Pt. does not wear socks, and therefore, will not need sock aid.  Discussed position change to help reduce pain.      OT Diagnosis:    OT Problem List:   OT Treatment Interventions:     OT Goals ADL Goals ADL Goal: Lower Body Dressing - Progress: Progressing toward goals ADL Goal: Toilet Transfer - Progress: Progressing toward goals ADL Goal: Toileting - Clothing Manipulation - Progress: Progressing  toward goals ADL Goal: Additional Goal #1 - Progress: Progressing toward goals  Visit Information  Last OT Received On: 03/21/12 Assistance Needed: +1    Subjective Data      Prior Functioning       Cognition  Overall Cognitive Status: Appears within functional limits for tasks assessed/performed Arousal/Alertness: Awake/alert Orientation Level: Appears intact for tasks assessed Behavior During Session: [facility] for tasks performed Cognition - Other Comments: Pt received pain meds just prior to treatment.   Pt is anxious about her weakness in her legs and her difficulty with ambulation.  States "I was not expecting this"  Pt is eager to progress, but seems to underestimate how her LE weakness present impacts her mobility    Mobility Bed Mobility Bed Mobility: Rolling Left;Rolling Right;Left Sidelying to Sit;Sitting - Scoot to Edge of Bed;Sit to Sidelying Left Rolling Left: 4: Min guard Left Sidelying to Sit: 4: Min assist;HOB elevated;With rails Sitting - Scoot to Edge of Bed: 4: Min guard Details for Bed Mobility Assistance: min A to lift shoulders off bed Transfers Transfers: Sit to Stand;Stand to Sit Sit to Stand: 4: Min assist;With upper extremity assist;From bed;From toilet;From chair/3-in-1 Stand to Sit: 4: Min assist;To bed;To chair/3-in-1;To toilet;With upper extremity assist Details for Transfer Assistance: Requires verbal cues for hand placement, to look up when ambulating, and for walker safety when maneuvering in small spaces such as the bathroom   Exercises    Balance    End of Session OT - End of Session Activity Tolerance: Patient limited by pain Patient left: in chair;with call bell/phone within reach;with family/visitor present;with nursing in room Nurse Communication: Patient requests pain meds  GO     Annastyn Silvey,  Ursula Alert M 03/21/2012, 1:14 PM

## 2012-03-21 NOTE — Consult Note (Signed)
Physical Medicine and Rehabilitation Consult Reason for Consult: Lumbar stenosis with radiculopathy Referring Physician: Dr. Otelia Sergeant   HPI: [name] [name] is a 46 y.o. right-handed female with history of chronic low back pain radiating to the right lower extremity . No bowel or bladder disturbances. Patient had noted increased back pain over the past 2 months. Admitted 6/24 13 with MRI and imaging revealed right lateral recess stenosis with age in the lumbar L4-5 with radiculopathy. Underwent lumbar laminectomy with decompression L3-4 and L4-5 with excision of HNP 6/24/ 13 per Dr. Otelia Sergeant. Postoperative pain management. No back brace required. Physical and occupational therapy ongoing with recommendations for physical medicine rehabilitation consult to consider inpatient rehabilitation services   C/o of RLE weakness preop and post op, pain better post op Review of Systems  Musculoskeletal: Positive for myalgias and back pain.  Psychiatric/Behavioral: Positive for depression.  All other systems reviewed and are negative.   Past Medical History  Diagnosis Date  . PONV (postoperative nausea and vomiting)     last 2 surgeries less nausea  . Hypertension   . Heart murmur     years ago  . Headache     migaines  . Arthritis   . Blood dyscrasia     hx o blood clots after ankle surgery   Past Surgical History  Procedure Date  . Breast surgery     left breast  . Tonsillectomy   . Abdominal hysterectomy     1995 have Ovaries  . Cholecystectomy   . Diagnostic laparoscopy   . Ankle surgery     2010 right ankle  . Melanoma excision 06/2011    right lower leg  . Bladder tack    History reviewed. No pertinent family history. Social History:  reports that she has never smoked. She has never used smokeless tobacco. She reports that she drinks alcohol. She reports that she does not use illicit drugs. Allergies:  Allergies  Allergen Reactions  . Penicillins Anaphylaxis  . Sulfa  Antibiotics Hives and Other (See Comments)    Hives, mouth blisters   . Ciprofloxacin Hives  . Phenergan (Promethazine Hcl) Other (See Comments)    Caused "severe drop in blood pressure."   Medications Prior to Admission  Medication Sig Dispense Refill  . escitalopram (LEXAPRO) 20 MG tablet Take 20 mg by mouth daily.      . hydrochlorothiazide (HYDRODIURIL) 25 MG tablet Take 25 mg by mouth daily.      Marland Kitchen HYDROmorphone (DILAUDID) 2 MG tablet Take 2-4 mg by mouth every 6 (six) hours as needed. For pain.      Marland Kitchen morphine (MSIR) 15 MG tablet Take 15-30 mg by mouth every 6 (six) hours as needed. For pain.      Marland Kitchen oxyCODONE-acetaminophen (PERCOCET) 10-325 MG per tablet Take 1 tablet by mouth every 6 (six) hours as needed. For pain.      . rosuvastatin (CRESTOR) 10 MG tablet Take 10 mg by mouth daily.      Marland Kitchen topiramate (TOPAMAX) 100 MG tablet Take 150 mg by mouth every morning. For migraines.        Home: Home Living Lives With: Spouse Available Help at Discharge: Family (2 college daughters will help during the day) Type of Home: House Home Access: Stairs to enter [employer] of Steps: 3 Entrance Stairs-Rails: None Home Layout: One level Bathroom Shower/Tub: Health visitor: Standard Home Adaptive Equipment: None  Functional History: Prior Function Able to Take Stairs?: Yes Driving: Yes Vocation: Full  time employment Functional Status:  Mobility: Bed Mobility Bed Mobility: Rolling Left;Rolling Right;Left Sidelying to Sit;Sitting - Scoot to [place] of Bed;Sit to Sidelying Left Rolling Right: 4: Min guard Rolling Left: 4: Min guard Left Sidelying to Sit: 4: Min guard;HOB flat Sitting - Scoot to [place] of Bed: 4: Min guard Sit to Sidelying Left: HOB flat Transfers Transfers: Sit to Stand;Stand to Sit Sit to Stand: 4: Min assist;With upper extremity assist;From bed;From elevated surface Stand to Sit: With upper extremity assist;To bed;4: Min  assist Ambulation/Gait Ambulation/Gait Assistance: 3: Mod assist;4: Min Environmental consultant (Feet): 12 Feet Assistive device: Rolling walker Ambulation/Gait Assistance Details: assist to weight shift and to bring RLE through, cues for upright posture and safe positioning with RW Gait Pattern: Step-to pattern;Trunk flexed;Decreased step length - right;Right genu recurvatum General Gait Details: foot drop on right, unable to clear foot, did buckle slightly but also with recurvatum to control knee on right    ADL: ADL Eating/Feeding: Simulated;Independent Where Assessed - Eating/Feeding: Edge of bed Grooming: Simulated;Minimal assistance Where Assessed - Grooming: Supported standing Upper Body Bathing: Simulated;Minimal assistance Where Assessed - Upper Body Bathing: Unsupported sitting Lower Body Bathing: Simulated;+1 Total assistance Where Assessed - Lower Body Bathing: Supported sit to stand Upper Body Dressing: Simulated;Minimal assistance Where Assessed - Upper Body Dressing: Unsupported sitting Lower Body Dressing: Simulated;+1 Total assistance Where Assessed - Lower Body Dressing: Supported sit to Pharmacist, hospital: Mining engineer Method: Sit to stand Transfers/Ambulation Related to ADLs: Pt with extensive pain with ambulation- Mod A with RW ambulation, pt with mild Rt knee buckling ADL Comments: limited by pain  Cognition: Cognition Arousal/Alertness: Awake/alert Orientation Level: Oriented X4 Cognition Overall Cognitive Status: Appears within functional limits for tasks assessed/performed Arousal/Alertness: Awake/alert Orientation Level: Appears intact for tasks assessed Behavior During Session: [facility] for tasks performed  Blood pressure 107/60, pulse 78, temperature 98.2 F (36.8 C), temperature source Oral, resp. rate 18, height 5\' 9"  (1.753 m), weight 79.4 kg (175 lb 0.7 oz), SpO2 97.00%. Physical Exam  Constitutional: She is  oriented to person, place, and time. She appears well-developed.  HENT:  Head: Normocephalic.  Neck: Neck supple. No thyromegaly present.  Cardiovascular: Regular rhythm.   Pulmonary/Chest: Breath sounds normal. No respiratory distress. She has no wheezes.  Abdominal: She exhibits no distension.  Musculoskeletal: She exhibits no edema.  Neurological: She is alert and oriented to person, place, and time.  Skin:       Back incision clean and dry  Psychiatric: She has a normal mood and affect.  Motor 5/5 in BUE and LLE, 4/5 in RLE Sensation reduced R L5  No results found for this or any previous visit (from the past 24 hour(s)). Dg Lumbar Spine 1 View  03/19/2012  *RADIOLOGY REPORT*  Clinical Data: Microdiskectomy  LUMBAR SPINE - 1 VIEW  Comparison: Myelography 02/15/2012  Findings: Single lateral C-arm image shows probes directed at the pedicle levels of L4 and L5.  IMPRESSION: L4 and L5 pedicle levels localized.  Original Report Authenticated By: Thomasenia Sales, M.D.    Assessment/Plan: Diagnosis: R L5 radic , lumbar stenosis s/p decompression 1. Does the need for close, 24 hr/day medical supervision in concert with the patient's rehab needs make it unreasonable for this patient to be served in a less intensive setting? No 2. Co-Morbidities requiring supervision/potential complications: depression 3. Due to bladder management, bowel management, safety, skin/wound care, disease management, medication administration and pain management, does the patient require 24 hr/day rehab nursing? Potentially  4. Does the patient require coordinated care of a physician, rehab nurse, PT (1-2 hrs/day, 5 days/week) and OT (1-2 hrs/day, 5 days/week) to address physical and functional deficits in the context of the above medical diagnosis(es)? Potentially Addressing deficits in the following areas: balance, endurance, locomotion, strength, transferring, bathing and dressing 5. Can the patient actively  participate in an intensive therapy program of at least 3 hrs of therapy per day at least 5 days per week? Yes 6. The potential for patient to make measurable gains while on inpatient rehab is good 7. Anticipated functional outcomes upon discharge from inpatient rehab are Mod I with PT, Mod I with OT, NA with SLP. 8. Estimated rehab length of stay to reach the above functional goals is: 3day 9. Does the patient have adequate social supports to accommodate these discharge functional goals? Yes 10. Anticipated D/C setting: Home 11. Anticipated post D/C treatments: HH therapy 12. Overall Rehab/Functional Prognosis: excellent  RECOMMENDATIONS: This patient's condition is appropriate for continued rehabilitative care in the following setting: [facility] Patient has agreed to participate in recommended program. Potentially Note that insurance prior authorization may be required for reimbursement for recommended care.  Comment:expect pt to progress to Sup level in next 24-48 hr    03/21/2012

## 2012-03-21 NOTE — Progress Notes (Signed)
Subjective: 2 Days Post-Op Procedure(s) (LRB): MICRODISCECTOMY LUMBAR LAMINECTOMY (Right) Patient reports pain as mild.   Still feeling weak and decreased sensation of right leg.  Slow progress with PT.  Recommendations for CIR, will order consult Objective: Vital signs in last 24 hours: Temp:  [97.8 F (36.6 C)-98.3 F (36.8 C)] 98.2 F (36.8 C) (06/26 0600) Pulse Rate:  [72-81] 78  (06/26 0600) Resp:  [18] 18  (06/26 0600) BP: (100-115)/(60-75) 107/60 mmHg (06/26 0600) SpO2:  [97 %-99 %] 97 % (06/26 0600)  Intake/Output from previous day:   Intake/Output this shift:    No results found for this basename: HGB:5 in the last 72 hours No results found for this basename: WBC:2,RBC:2,HCT:2,PLT:2 in the last 72 hours No results found for this basename: NA:2,K:2,CL:2,CO2:2,BUN:2,CREATININE:2,GLUCOSE:2,CALCIUM:2 in the last 72 hours No results found for this basename: LABPT:2,INR:2 in the last 72 hours  Neurovascular intact Incision: no drainage  Assessment/Plan: 2 Days Post-Op Procedure(s) (LRB): MICRODISCECTOMY LUMBAR LAMINECTOMY (Right) Up with therapy Continue PT CIR consult  Neesa Knapik M 03/21/2012, 8:41 AM

## 2012-03-21 NOTE — Progress Notes (Signed)
Physical Therapy Treatment Patient Details Name: [name] [name] MRN: [id number] DOB: [birth year] Today's Date: 03/21/2012 Time: 4401-0272 PT Time Calculation (min): 46 min  PT Assessment / Plan / Recommendation Comments on Treatment Session  Pt is anxious about the weakness in her LE and worried about her difficulty mobilizing.  She is eager to progress and can be impulsive during ambulation.  She will benefit from continued PT to build strength and endurance.      Follow Up Recommendations  Inpatient Rehab;Supervision for mobility/OOB    Barriers to Discharge        Equipment Recommendations  3 in 1 bedside comode    Recommendations for Other Services    Frequency 7X/week   Plan      Precautions / Restrictions Precautions Precautions: Back;Fall Restrictions Weight Bearing Restrictions: No   Pertinent Vitals/Pain 2/10 back at end of session    Mobility  Bed Mobility Bed Mobility: Rolling Left;Rolling Right;Left Sidelying to Sit;Sitting - Scoot to Edge of Bed;Sit to Sidelying Left Rolling Left: 4: Min guard Left Sidelying to Sit: 4: Min assist;HOB elevated;With rails Sitting - Scoot to Edge of Bed: 4: Min guard Details for Bed Mobility Assistance: min A to lift shoulders off bed Transfers Sit to Stand: 4: Min assist;With upper extremity assist;From bed;From toilet;From chair/3-in-1 Stand to Sit: 4: Min assist;To bed;To chair/3-in-1;To toilet;With upper extremity assist Details for Transfer Assistance: Requires verbal cues for hand placement, to look up when ambulating, and for walker safety when maneuvering in small spaces such as the bathroom Ambulation/Gait Ambulation/Gait Assistance: 3: Mod assist Ambulation Distance (Feet): 20 Feet Assistive device: Rolling walker Ambulation/Gait Assistance Details: pt needed A for LOB and buckling of R LE and weakness in both LE.  Pt needed 2 seated rest breaks and was unable to ambulate back into room due to fatigue.  Pt. was  educated on proper use of RW and keeping RW closer to her BOS.   Gait Pattern: Step-to pattern;Decreased stride length;Decreased weight shift to right;Shuffle;Right flexed knee in stance;Trunk flexed Gait velocity: decreased gait speed General Gait Details: difficulty with weight shifting due to weakness and instability in knee extension.  Due to weakness and anxiety, pt required VC to decrease weight bearing in UE. Stairs: No    Exercises     PT Diagnosis:    PT Problem List:   PT Treatment Interventions:     PT Goals Acute Rehab PT Goals PT Goal: Rolling Supine to Left Side - Progress: Progressing toward goal PT Goal: Supine/Side to Sit - Progress: Progressing toward goal PT Goal: Sit to Stand - Progress: Progressing toward goal PT Goal: Stand to Sit - Progress: Progressing toward goal PT Goal: Ambulate - Progress: Progressing toward goal  Visit Information  Last PT Received On: 03/21/12 Assistance Needed: +1    Subjective Data      Cognition  Overall Cognitive Status: Appears within functional limits for tasks assessed/performed Arousal/Alertness: Awake/alert Orientation Level: Appears intact for tasks assessed Behavior During Session: [facility] for tasks performed Cognition - Other Comments: Pt received pain meds just prior to treatment.   Pt is anxious about her weakness in her legs and her difficulty with ambulation.  States "I was not expecting this"  Pt is eager to progress, but seems to underestimate how her LE weakness present impacts her mobility    Balance     End of Session PT - End of Session Equipment Utilized During Treatment: Gait belt Activity Tolerance: Patient limited by fatigue;Treatment limited secondary to  medication Patient left: in chair;with call bell/phone within reach Nurse Communication: Mobility status   GP     Apollo Timothy, SPTA 03/21/2012, 2:23 PM

## 2012-03-21 NOTE — Progress Notes (Signed)
Seen and agreed 03/21/2012 Heiress Williamson Elizabeth PTA [phone] pager [phone] office    

## 2012-03-22 ENCOUNTER — Encounter (HOSPITAL_COMMUNITY): Payer: Self-pay | Admitting: Specialist

## 2012-03-22 DIAGNOSIS — I959 Hypotension, unspecified: Secondary | ICD-10-CM

## 2012-03-22 DIAGNOSIS — R112 Nausea with vomiting, unspecified: Secondary | ICD-10-CM

## 2012-03-22 DIAGNOSIS — E876 Hypokalemia: Secondary | ICD-10-CM

## 2012-03-22 LAB — CBC WITH DIFFERENTIAL/PLATELET
Basophils Absolute: 0 10*3/uL (ref 0.0–0.1)
Basophils Relative: 0 % (ref 0–1)
Eosinophils Relative: 5 % (ref 0–5)
HCT: 35 % — ABNORMAL LOW (ref 36.0–46.0)
Lymphocytes Relative: 36 % (ref 12–46)
MCHC: 33.7 g/dL (ref 30.0–36.0)
MCV: 93.3 fL (ref 78.0–100.0)
Monocytes Absolute: 0.5 10*3/uL (ref 0.1–1.0)
Neutro Abs: 4.7 10*3/uL (ref 1.7–7.7)
Platelets: 202 10*3/uL (ref 150–400)
RDW: 13.2 % (ref 11.5–15.5)
WBC: 8.8 10*3/uL (ref 4.0–10.5)

## 2012-03-22 LAB — COMPREHENSIVE METABOLIC PANEL
ALT: 20 U/L (ref 0–35)
AST: 17 U/L (ref 0–37)
Albumin: 2.9 g/dL — ABNORMAL LOW (ref 3.5–5.2)
CO2: 27 mEq/L (ref 19–32)
Calcium: 8.5 mg/dL (ref 8.4–10.5)
Creatinine, Ser: 0.83 mg/dL (ref 0.50–1.10)
Sodium: 141 mEq/L (ref 135–145)

## 2012-03-22 MED ORDER — COSYNTROPIN 0.25 MG IJ SOLR
0.2500 mg | Freq: Once | INTRAMUSCULAR | Status: AC
  Administered 2012-03-23: 0.25 mg via INTRAVENOUS
  Filled 2012-03-22: qty 0.25

## 2012-03-22 MED ORDER — SODIUM CHLORIDE 0.9 % IV BOLUS (SEPSIS)
1000.0000 mL | Freq: Once | INTRAVENOUS | Status: AC
  Administered 2012-03-22: 1000 mL via INTRAVENOUS

## 2012-03-22 MED ORDER — POTASSIUM CHLORIDE CRYS ER 20 MEQ PO TBCR
40.0000 meq | EXTENDED_RELEASE_TABLET | Freq: Two times a day (BID) | ORAL | Status: DC
  Administered 2012-03-22 – 2012-03-23 (×2): 40 meq via ORAL
  Filled 2012-03-22 (×3): qty 2

## 2012-03-22 MED ORDER — BISACODYL 10 MG RE SUPP
10.0000 mg | Freq: Once | RECTAL | Status: AC
  Administered 2012-03-22: 10 mg via RECTAL
  Filled 2012-03-22: qty 1

## 2012-03-22 NOTE — Progress Notes (Signed)
Subjective: 3 Days Post-Op Procedure(s) (LRB): MICRODISCECTOMY LUMBAR LAMINECTOMY (Right) Patient reports pain as mild when at rest, but intensifies as she increases activity.  Using Oxycodone and tolerates well Up with PT this am.  Some better with gait.  They feel she may be ready tomorrow and pt agrees. No nausea or vomiting.  Voiding well.  +flatus  Objective: Vital signs in last 24 hours: Temp:  [98.1 F (36.7 C)-98.6 F (37 C)] 98.3 F (36.8 C) (06/27 0600) Pulse Rate:  [66-75] 66  (06/27 0600) Resp:  [16-18] 16  (06/26 1814) BP: (89-115)/(57-74) 106/70 mmHg (06/27 0600) SpO2:  [97 %-100 %] 97 % (06/27 0600)  Intake/Output from previous day:   Intake/Output this shift:    No results found for this basename: HGB:5 in the last 72 hours No results found for this basename: WBC:2,RBC:2,HCT:2,PLT:2 in the last 72 hours No results found for this basename: NA:2,K:2,CL:2,CO2:2,BUN:2,CREATININE:2,GLUCOSE:2,CALCIUM:2 in the last 72 hours No results found for this basename: LABPT:2,INR:2 in the last 72 hours  ABD soft Neurovascular intact Sensation intact distally Intact pulses distally Dorsiflexion/Plantar flexion intact Incision: no drainage  Assessment/Plan: 3 Days Post-Op Procedure(s) (LRB): MICRODISCECTOMY LUMBAR LAMINECTOMY (Right) Up with therapy today and again tomorrow.  If continues to progress will plan for discharge tomorrow.  Rylah Fukuda M 03/22/2012, 8:46 AM

## 2012-03-22 NOTE — Progress Notes (Signed)
Physical Therapy Treatment Patient Details Name: [name] [name] MRN: [id number] DOB: 1966-08-26 Today's Date: 03/22/2012 Time: 0830-0910 PT Time Calculation (min): 40 min  PT Assessment / Plan / Recommendation Comments on Treatment Session  Pt remains anxious and fearful of falling due to weakness in LE.  Educated pt on energy conservation and taking frequent rest breaks.  Next session continue to increase ambulattion distance with decreased fatigue.  Need to attempt stairs when patient is ready.  Encouraged pt to continue quad sets and to sit-stand 10 times during the day from chair to build strength and endurance in knee/hip extensors.      Follow Up Recommendations  Inpatient Rehab;Supervision for mobility/OOB    Barriers to Discharge        Equipment Recommendations  3 in 1 bedside comode    Recommendations for Other Services Rehab consult  Frequency 7X/week   Plan      Precautions / Restrictions Precautions Precautions: Back;Fall Precaution Booklet Issued: Yes (comment) Precaution Comments: Provided back handout to pt.  Reviewed precautions.  Pt able to repeat 3/3 Restrictions Weight Bearing Restrictions: No       Mobility  Bed Mobility Left Sidelying to Sit: 4: Min assist;With rails;HOB elevated Sitting - Scoot to Edge of Bed: 5: Supervision Details for Bed Mobility Assistance: Min assist needed to move LE off bed when side-lying to sit Transfers Sit to Stand: 4: Min assist;Without upper extremity assist;With armrests;From bed;From chair/3-in-1 Stand to Sit: 4: Min assist;To chair/3-in-1;With upper extremity assist;With armrests Details for Transfer Assistance: Requires VC for hand placement and proper form.  Min assist to obtain upright posture and full knee/hip extension Ambulation/Gait Ambulation/Gait Assistance: 4: Min assist Ambulation Distance (Feet): 75 Feet Assistive device: Rolling walker Ambulation/Gait Assistance Details: Pt needed min assist for  occaisional LOB and to weight shift.  Pt needed frequent VC to fully extend knee and proper step length.     Gait Pattern: Step-to pattern;Decreased stride length;Decreased weight shift to right;Shuffle;Right flexed knee in stance;Trunk flexed Gait velocity: decreased gait speed General Gait Details: Pt able to increase ambulation distance from last treatment.  Gait improved with VC to fully extend knee and for proper step length.  Pt requires VC to decrease weight  bearing in arms.   Stairs: No    Exercises     PT Diagnosis:    PT Problem List:   PT Treatment Interventions:     PT Goals Acute Rehab PT Goals PT Goal: Rolling Supine to Left Side - Progress: Progressing toward goal PT Goal: Supine/Side to Sit - Progress: Progressing toward goal PT Goal: Sit to Stand - Progress: Progressing toward goal PT Goal: Stand to Sit - Progress: Progressing toward goal PT Goal: Ambulate - Progress: Progressing toward goal  Visit Information  Last PT Received On: 03/22/12 Assistance Needed: +1    Subjective Data      Cognition  Overall Cognitive Status: Appears within functional limits for tasks assessed/performed Arousal/Alertness: Awake/alert Behavior During Session: [name] for tasks performed Cognition - Other Comments: Pt is able to perform tasks, but remains anxious about her LE weakness and ability to function at home.  However, she remains eager to go home.    Balance     End of Session PT - End of Session Equipment Utilized During Treatment: Gait belt Activity Tolerance: Patient tolerated treatment well Patient left: in chair;with call bell/phone within reach;with family/visitor present Nurse Communication: Mobility status   GP     [name], SPTA 03/22/2012, 11:56 AM

## 2012-03-22 NOTE — Progress Notes (Signed)
Orthopedic Tech Progress Note Patient Details:  [name] [name] 11/10/1965 [id number]  Patient ID: [name], female   DOB: 1965-12-22, 46 y.o.   MRN: [id number] Contacted [employer] with brace order.  Leo Grosser T 03/22/2012, 3:35 PM

## 2012-03-22 NOTE — Progress Notes (Signed)
Seen and agreed 03/22/2012 Everlene Cunning Elizabeth PTA [phone] pager [phone] office    

## 2012-03-22 NOTE — Progress Notes (Signed)
Orthopedic Tech Progress Note Patient Details:  [name] [name] [birth year] [id number]  Patient ID: [name] [name], female   DOB: 1966/05/16, 46 y.o.   MRN: [id number] Brace order completed by Storm Frisk, Yacoub Diltz 03/22/2012, 5:41 PM

## 2012-03-22 NOTE — Progress Notes (Addendum)
Patient ID: [name] [name], female   DOB: 02-07-1966, 46 y.o.   MRN: [id number] This patient seen today she is alert oriented x4 awake. She is receiving needed Percocet 2 tablets at a time. She is unable to tolerate NSAIDS do to stomach irritability. She feels tired. Nursing notes that her blood pressure is lower hypotensive. Systolic 96. He normally requires antihypertensive agents. Her exam she is warm alert oriented in no distress.  Her lower lower extremity motor shows normal foot dorsi flexion and plantar flexion. Normal knee strength and extension. Nursing personnel note that after walking 15 or 20 feet she tends to shake and despite pain behavior. She is moving her bowels and passing her water voiding without difficulty.  Status post two-level microdiscectomy using MIS approach patient demonstrating quite a bit of painful behavior and difficulty in getting started in therapy program. Will consider placing her into a corset dressing change today unremarkable no drainage. As she is showing low blood pressure will discontinue her antihypertensive agents and contact her primary care physician Dr.Avva and the request consultation. 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 She notes that she did show improved ambulation today to the hallway in spite of episodic severe pain in that she is experiencing. May be a candidate for discharge to home with home health for physical therapy perhaps by this week.

## 2012-03-22 NOTE — Progress Notes (Signed)
Patient examined and note reviewed. 

## 2012-03-22 NOTE — Progress Notes (Signed)
Per rehab MD consult, patient is expected to be at a level that is too high for CIR admission. Discussed with patient who states, she did much better with therapies today and feels she would like to go home. CIR signing off. Marina Goodell adm coordinator [phone].

## 2012-03-22 NOTE — Consult Note (Signed)
Reason for Consult: Hypotension. Referring Physician: Dr.Nitka.  [name] [name] is an 46 y.o. female.  HPI: 46 year-old female with known history of hypertension and hyperlipidemia who had undergone L4-L5 lumbar discectomy for low back pain 4 days ago was noticed to have hypotension today and blood pressure systolic was in her 80s. Patient has been having worsening low back pain over the last 3 months and had at least 2 epidural steroid injection the last one was 3 weeks ago. She also had 2 doses of steroid tapering orally over the last 3 months. 4 days ago after surgery patient had developed generalized itching and rash after receiving vancomycin. Patient was given Decadron IV along with Benadryl and her symptoms improved and resolved. Today patient does state that when she tries to walk she does feel weak and dizzy. Denies any loss of consciousness focal deficits headache or any shortness of breath or chest pain.  Past Medical History  Diagnosis Date  . PONV (postoperative nausea and vomiting)     last 2 surgeries less nausea  . Hypertension   . Heart murmur     years ago  . Headache     migaines  . Arthritis   . Blood dyscrasia     hx o blood clots after ankle surgery    Past Surgical History  Procedure Date  . Breast surgery     left breast  . Tonsillectomy   . Abdominal hysterectomy     1995 have Ovaries  . Cholecystectomy   . Diagnostic laparoscopy   . Ankle surgery     2010 right ankle  . Melanoma excision 06/2011    right lower leg  . Bladder tack   . Lumbar laminectomy 03/19/2012    Procedure: MICRODISCECTOMY LUMBAR LAMINECTOMY;  Surgeon: Kerrin Champagne, MD;  Location: [facility] OR;  Service: Orthopedics;  Laterality: Right;  MIS Right L3-4 lateral recess decompression and Right L4-5 Microdiscectomy    History reviewed. No pertinent family history.  Social History:  reports that she has never smoked. She has never used smokeless tobacco. She reports that she drinks  alcohol. She reports that she does not use illicit drugs.  Allergies:  Allergies  Allergen Reactions  . Penicillins Anaphylaxis  . Sulfa Antibiotics Hives and Other (See Comments)    Hives, mouth blisters   . Ciprofloxacin Hives  . Phenergan (Promethazine Hcl) Other (See Comments)    Caused "severe drop in blood pressure."  . Vancomycin Cross Reactors Rash    Medications: I have reviewed the patient's current medications.  Results for orders placed during the hospital encounter of 03/19/12 (from the past 48 hour(s))  CBC WITH DIFFERENTIAL     Status: Abnormal   Collection Time   03/22/12  3:08 PM      Component Value Range Comment   WBC 8.8  4.0 - 10.5 K/uL    RBC 3.75 (*) 3.87 - 5.11 MIL/uL    Hemoglobin 11.8 (*) 12.0 - 15.0 g/dL    HCT 16.1 (*) 09.6 - 46.0 %    MCV 93.3  78.0 - 100.0 fL    MCH 31.5  26.0 - 34.0 pg    MCHC 33.7  30.0 - 36.0 g/dL    RDW 04.5  40.9 - 81.1 %    Platelets 202  150 - 400 K/uL    Neutrophils Relative 54  43 - 77 %    Neutro Abs 4.7  1.7 - 7.7 K/uL    Lymphocytes Relative 36  12 - 46 %    Lymphs Abs 3.2  0.7 - 4.0 K/uL    Monocytes Relative 6  3 - 12 %    Monocytes Absolute 0.5  0.1 - 1.0 K/uL    Eosinophils Relative 5  0 - 5 %    Eosinophils Absolute 0.4  0.0 - 0.7 K/uL    Basophils Relative 0  0 - 1 %    Basophils Absolute 0.0  0.0 - 0.1 K/uL   COMPREHENSIVE METABOLIC PANEL     Status: Abnormal   Collection Time   03/22/12  3:08 PM      Component Value Range Comment   Sodium 141  135 - 145 mEq/L    Potassium 3.2 (*) 3.5 - 5.1 mEq/L    Chloride 105  96 - 112 mEq/L    CO2 27  19 - 32 mEq/L    Glucose, Bld 99  70 - 99 mg/dL    BUN 11  6 - 23 mg/dL    Creatinine, Ser 5.28  0.50 - 1.10 mg/dL    Calcium 8.5  8.4 - 41.3 mg/dL    Total Protein 5.4 (*) 6.0 - 8.3 g/dL    Albumin 2.9 (*) 3.5 - 5.2 g/dL    AST 17  0 - 37 U/L    ALT 20  0 - 35 U/L    Alkaline Phosphatase 54  39 - 117 U/L    Total Bilirubin 0.2 (*) 0.3 - 1.2 mg/dL    GFR calc  non Af Amer 84 (*) >90 mL/min    GFR calc Af Amer >90  >90 mL/min     No results found.  Review of Systems  HENT: Negative.   Eyes: Negative.   Respiratory: Negative.   Cardiovascular: Negative.   Gastrointestinal: Negative.   Genitourinary: Negative.   Musculoskeletal: Negative.   Skin: Negative.   Neurological: Positive for weakness.  Endo/Heme/Allergies: Negative.   Psychiatric/Behavioral: Negative.    Blood pressure 118/80, pulse 77, temperature 98.4 F (36.9 C), temperature source Oral, resp. rate 18, height 5\' 9"  (1.753 m), weight 79.4 kg (175 lb 0.7 oz), SpO2 95.00%. Physical Exam  Constitutional: She is oriented to person, place, and time. She appears well-developed and well-nourished. No distress.  HENT:  Head: Normocephalic and atraumatic.  Right Ear: External ear normal.  Left Ear: External ear normal.  Nose: Nose normal.  Mouth/Throat: Oropharynx is clear and moist. No oropharyngeal exudate.  Eyes: Conjunctivae are normal. Pupils are equal, round, and reactive to light. Right eye exhibits no discharge. Left eye exhibits no discharge. No scleral icterus.  Neck: Normal range of motion. Neck supple.  Cardiovascular: Normal rate and regular rhythm.   Respiratory: Effort normal and breath sounds normal. No respiratory distress. She has no wheezes. She has no rales.  GI: Soft. Bowel sounds are normal. She exhibits no distension. There is no tenderness. There is no rebound.  Musculoskeletal: Normal range of motion. She exhibits no edema and no tenderness.  Neurological: She is alert and oriented to person, place, and time.       Moves all extremities.  Skin: Skin is warm and dry. No rash noted. She is not diaphoretic. No erythema.  Psychiatric: Her behavior is normal.    Assessment/Plan: #1. Hypotension - most likely reason could be dehydration and her pain relief medications. I had ordered 1 L fluid bolus after which her blood pressure has increased to 114 systolic. I  will discontinue her HCTZ for now. I have  also ordered cosyntropin test as patient recently was on steroids to check for adrenal insufficiency. #2. Mild hypokalemia - replace and recheck. This could be from HCTZ which I have discontinued. #3. History of hypertension presently hypotensive - discontinue HCTZ. #4. History of hyperlipidemia - continue statins. #5. Anemia - follow CBC.  Recheck metabolic panel and CBC in a.m. Patient's blood pressure had this time is improved with 1 L normal saline bolus. I have discontinued HCTZ for now.  Thanks for involving [place] in the patient's care. We will follow along with you.  Eduard Clos 03/22/2012, 10:33 PM

## 2012-03-23 DIAGNOSIS — I959 Hypotension, unspecified: Secondary | ICD-10-CM

## 2012-03-23 DIAGNOSIS — E876 Hypokalemia: Secondary | ICD-10-CM

## 2012-03-23 DIAGNOSIS — R112 Nausea with vomiting, unspecified: Secondary | ICD-10-CM

## 2012-03-23 LAB — BASIC METABOLIC PANEL
CO2: 26 mEq/L (ref 19–32)
Chloride: 108 mEq/L (ref 96–112)
GFR calc Af Amer: >90 mL/min (ref >90)
Potassium: 3.9 mEq/L (ref 3.5–5.1)
Sodium: 142 mEq/L (ref 135–145)

## 2012-03-23 LAB — CBC
HCT: 35.8 % — ABNORMAL LOW (ref 36.0–46.0)
Hemoglobin: 12.1 g/dL (ref 12.0–15.0)
MCV: 93.7 fL (ref 78.0–100.0)
RBC: 3.82 MIL/uL — ABNORMAL LOW (ref 3.87–5.11)
RDW: 13.2 % (ref 11.5–15.5)
WBC: 6.4 10*3/uL (ref 4.0–10.5)

## 2012-03-23 MED ORDER — OXYCODONE-ACETAMINOPHEN 5-325 MG PO TABS: 1.0000 | ORAL_TABLET | Freq: Four times a day (QID) | ORAL | Status: DC | PRN

## 2012-03-23 MED ORDER — POTASSIUM CHLORIDE CRYS ER 20 MEQ PO TBCR: 40.0000 meq | EXTENDED_RELEASE_TABLET | Freq: Once | ORAL | Status: DC

## 2012-03-23 MED ORDER — POTASSIUM CHLORIDE 10 MEQ/100ML IV SOLN
10.0000 meq | INTRAVENOUS | Status: AC
  Administered 2012-03-23: 10 meq via INTRAVENOUS
  Filled 2012-03-23 (×4): qty 100

## 2012-03-23 NOTE — Progress Notes (Signed)
Occupational Therapy Treatment Patient Details Name: [name] [name] MRN: [id number] DOB: 03-Nov-1965 Today's Date: 03/23/2012 Time: 0800-0829 OT Time Calculation (min): 29 min  OT Assessment / Plan / Recommendation Comments on Treatment Session Focus of session on performing safe shower transfer and bathing technique.  While pt is making progress, she frequently becomes anxious during functional mobility and thus demonstrates decreased safety awareness.      Follow Up Recommendations  Home health OT;Supervision/Assistance - 24 hour (CIR denied)    Barriers to Discharge       Equipment Recommendations  3 in 1 bedside comode    Recommendations for Other Services    Frequency Min 2X/week   Plan Discharge plan remains appropriate    Precautions / Restrictions Precautions Precautions: Back;Fall Precaution Booklet Issued: Yes (comment)   Pertinent Vitals/Pain See vitals    ADL  Toilet Transfer: Simulated;Minimal assistance Toilet Transfer Method:  (ambulating) Toilet Transfer Equipment: Other (comment) (chair) Tub/Shower Transfer: Simulated;Minimal assistance Tub/Shower Transfer Method: Science writer: Shower seat with back Equipment Used: Back brace;Gait belt;Rolling walker Transfers/Ambulation Related to ADLs: Pt ambulated to therapy gym with min assist with RW. Assist for occasional LOB and weight shifting.  VC for gait sequencing due to Rt knee buckling. ADL Comments: Requires frequent verbal cues to avoid twisting during functional activity. Increased time required for all functional mobility.  Educated pt on walk in shower transfer, and pt able to return demonstration of walk in shower transfer simulation wiht mod verbal cueing.  Discussed with pt use of 3n1 for shower seat and use of long handled sponge to safely perform LB bathing.  Pt reports her husband is purchasing the hip kit from the hospital gift shop. Pt becomes anxious during functional  ambulation and transfers and becomes unsafe/impulsive due to anxiety.      OT Diagnosis:    OT Problem List:   OT Treatment Interventions:     OT Goals ADL Goals Pt Will Transfer to Toilet: with modified independence;Ambulation;with DME;3-in-1 ADL Goal: Toilet Transfer - Progress: Progressing toward goals Pt Will Perform Tub/Shower Transfer: Shower transfer;with supervision;Ambulation;with DME ADL Goal: Tub/Shower Transfer - Progress: Progressing toward goals Additional ADL Goal #1: Pt will verbalize/generalize 3/3 back precautions for use with ADLs ADL Goal: Additional Goal #1 - Progress: Progressing toward goals  Visit Information  Last OT Received On: 03/23/12    Subjective Data      Prior Functioning       Cognition  Overall Cognitive Status: Appears within functional limits for tasks assessed/performed Arousal/Alertness: Awake/alert Orientation Level: Appears intact for tasks assessed Behavior During Session: Anxious    Mobility Transfers Transfers: Stand to Sit;Sit to Stand Sit to Stand: 4: Min guard;From chair/3-in-1;With upper extremity assist Stand to Sit: 4: Min guard;To chair/3-in-1;With upper extremity assist Details for Transfer Assistance: Min guard for safety. Requires VC for safe hand placement.   Exercises    Balance    End of Session OT - End of Session Equipment Utilized During Treatment: Gait belt;Back brace Activity Tolerance: Patient limited by fatigue Patient left: in chair;with call bell/phone within reach  GO     Cipriano Mile 03/23/2012, 8:54 AM

## 2012-03-23 NOTE — Progress Notes (Signed)
IV in rt hand removed. No redness noted. Pt denies any pain or tenderness.

## 2012-03-23 NOTE — Progress Notes (Signed)
Physical Therapy Treatment Patient Details Name: [name] [name] MRN: [id number] DOB: December 31, 1965 Today's Date: 03/23/2012 Time: 4098-1191 PT Time Calculation (min): 40 min  PT Assessment / Plan / Recommendation Comments on Treatment Session  Pt. continues to have fatigue from ambulation and is fearful of falling.  Pt sways upon standing and relies on RW and assistance from PTA to obtain balance when standing.  Pt. was able to increase ambulation distance before requiring a seated rest break.  Pt has a tendency to push herself to exhaustion, leading to safety issues.  Pt had prior low BP episodes.  She began to feel dizzy and weak during ambulation, but BP was 123/87.  Pt attempted stairs but would benefit from additional practice and further ambulation/PT to build strength and endurance.  Continued to education patient of safety awareness and energy conservation.      Follow Up Recommendations  Inpatient Rehab;Supervision for mobility/OOB    Barriers to Discharge        Equipment Recommendations  3 in 1 bedside comode    Recommendations for Other Services Rehab consult  Frequency 7X/week   Plan Discharge plan remains appropriate;Frequency remains appropriate    Precautions / Restrictions Precautions Precautions: Back Precaution Booklet Issued: Yes (comment) Precaution Comments: Reviewed back precautions.  Pt able to repeata 3/3 Required Braces or Orthoses: Spinal Brace Spinal Brace: Lumbar corset;Applied in sitting position Restrictions Weight Bearing Restrictions: No   Pertinent Vitals/Pain     Mobility  Bed Mobility Rolling Left: 5: Supervision Left Sidelying to Sit: 5: Supervision;HOB elevated;With rails Sitting - Scoot to Edge of Bed: 5: Supervision Details for Bed Mobility Assistance: pt needed supervision and VC to observe back precautions Transfers Sit to Stand: From chair/3-in-1;4: Min guard;With armrests Stand to Sit: To chair/3-in-1;With armrests;4: Min  guard Details for Transfer Assistance: Pt needed minguard for safety and to find balance from sit to stand.  Req VC for safe hand placement and proper form. Ambulation/Gait Ambulation/Gait Assistance: 4: Min guard Ambulation Distance (Feet): 100 Feet Assistive device: Rolling walker Ambulation/Gait Assistance Details: pt needed VC to fully extend knee, to maintain upright posture and to decrease weight bearing on UE.  Minguard for safety.  Pt. is unsteady and exhibits fear of falling.   Gait Pattern: Step-through pattern;Wide base of support;Decreased weight shift to right;Shuffle;Trunk flexed Gait velocity: decreased gait speed General Gait Details: Pt able to increase gait distance from last treatment.  Required 3-4 standing rest breaks, and one 5 min seated rest break.  Pt did not ambulate all the way back into the room.  Required WC rest break to return to room due to fatigue and shakiness.   Stairs: Yes Stairs Assistance: 1: +2 Total assist;Patient percentage (comment) (Pt did 80%) Stairs Assistance Details (indicate cue type and reason): Pt was educated on proper stair ascend/descend with a RW.   A was needed to safely position and balance patient. Stair Management Technique: With walker;Backwards;Step to pattern Number of Stairs: 5     Exercises     PT Diagnosis:    PT Problem List:   PT Treatment Interventions:     PT Goals Acute Rehab PT Goals PT Goal: Rolling Supine to Left Side - Progress: Progressing toward goal PT Goal: Supine/Side to Sit - Progress: Progressing toward goal PT Goal: Sit to Stand - Progress: Progressing toward goal PT Goal: Stand to Sit - Progress: Progressing toward goal PT Goal: Ambulate - Progress: Progressing toward goal PT Goal: Up/Down Stairs - Progress: Progressing toward goal  Visit Information  Last PT Received On: 03/23/12 Assistance Needed: +1 PT/OT Co-Evaluation/Treatment: Yes    Subjective Data      Cognition  Overall Cognitive Status:  Appears within functional limits for tasks assessed/performed Arousal/Alertness: Awake/alert Orientation Level: Appears intact for tasks assessed Behavior During Session: [facility] for tasks performed Cognition - Other Comments: Pt remains anxious about her condition     Balance     End of Session PT - End of Session Equipment Utilized During Treatment: Gait belt;Back brace Activity Tolerance: Patient limited by fatigue Patient left: in chair;with call bell/phone within reach Nurse Communication: Mobility status   GP     Marvyn Torrez, SPTA 03/23/2012, 10:40 AM

## 2012-03-23 NOTE — Progress Notes (Signed)
Pt home with walker /devices of care . Home instruction given to patient and husband. Pt also had on her brace and was  Wheeled out by [employer]   To car .Both husband and pt understood orders and was able to repeat the instruction given by MD and case manager.I also explained why I did not come back in pt was resting and daughter was at bedside and I told her to let me know if they needed anything . Potassium drip was  Stopped as order by Dr. Isidoro Donning after pt c/o of site burning . Flushed with NS and gave heating pad to help   No problems were voiced to me.

## 2012-03-23 NOTE — Progress Notes (Signed)
No signs of kt infiltration at IV site  Will give discharge orders  ,fu care and home care all [facility]  Taken care of

## 2012-03-23 NOTE — Progress Notes (Signed)
Pt c/o IV  Kt hurting really bad and burning . Running with ns and very slow and she is still uncomfortable and requested the kt to be stopped. Flushing with ns and MD called for further orders

## 2012-03-23 NOTE — Discharge Summary (Signed)
Physician Discharge Summary  Patient ID: [name] [name] MRN: [id number] DOB/AGE: 31-Dec-1965 28 y.o.  Admit date: 03/19/2012 Discharge date:  03/23/2012 Admission Diagnoses:  Principal Problem:  *Spinal stenosis of lumbar region with neurogenic claudication Active Problems:  HNP (herniated nucleus pulposus), lumbar   Discharge Diagnoses:  Same  Past Medical History  Diagnosis Date  . PONV (postoperative nausea and vomiting)     last 2 surgeries less nausea  . Hypertension   . Heart murmur     years ago  . Headache     migaines  . Arthritis   . Blood dyscrasia     hx o blood clots after ankle surgery    Surgeries: Procedure(s): MICRODISCECTOMY LUMBAR LAMINECTOMY on 03/19/2012  Right L4-5 and L3-4 lateral recess decompression and excision of HNP right L4-5. Consultants: Treatment Team:  Loel Ro, MD Ripudeep Jenna Luo, MD  Discharged Condition: Improved  Hospital Course: [name] [name] is an 46 y.o. female who was admitted 03/19/2012 with a chief complaint of No chief complaint on file. , and found to have a diagnosis of Spinal stenosis of lumbar region with neurogenic claudication.  They were brought to the operating room on 03/19/2012 and underwent the above named procedures.    They were given perioperative antibiotics:  Anti-infectives     Start     Dose/Rate Route Frequency Ordered Stop   03/19/12 2000   vancomycin (VANCOCIN) IVPB 1000 mg/200 mL premix        1,000 mg 200 mL/hr over 60 Minutes Intravenous  Once 03/19/12 1304 03/19/12 2113   03/18/12 0919   vancomycin (VANCOCIN) IVPB 1000 mg/200 mL premix        1,000 mg 200 mL/hr over 60 Minutes Intravenous 60 min pre-op 03/18/12 0919 03/19/12 0740        .  They were given sequential compression devices, early ambulation, and chemoprophylaxis for DVT prophylaxis. Postoperative day #1 she displays a severe pain and difficulty with standing and ambulation with episodic back spasm. Foley was discontinued  she was able to void spontaneously but had severe limitations in walking and standing. Physical therapy and noted problems with truncal imbalance difficulties and an inpatient physical therapy program was be considered. IV pain medications intermittently and by mouth narcotics were continued. Postoperative day #2 patient remained quite uncomfortable and showed only small gains in therapy walking 5-10 feet at the most. Physical therapy continued to work with [name] [name]. Occupational therapy also continued to work with her to improve her functional status. On postoperative day #3 she was able to move her bowels she showed improved standing tolerance of walking tolerance. On postoperative day #4 she is able to stand and ambulate with use of a walker and demonstrated ability to perform some stair climbing. Home health evaluation and treatment with physical therapy and occupational therapy was recommended. Her management assisted in team for these to be done. On postoperative day #4 she was discharged home to continue recuperation from her surgery and a right-sided residual of stenosis with an overlying disc herniation at the L3-4 and L4-5 levels. At the time of discharge she was taking tolerating by mouth medicines. She has demonstrated hypovolemia and decreased blood pressure and an internal medicine consult was obtained on postoperative day #3. Volume replenishment and discontinuance of HCTZ brought about any returned to normal blood pressures. ACTH test was done on postoperative day #4 and the test results are pending at the time of this patient's discharge. She however demonstrated excellent vital sign  ability on postoperative day #4 and was able to be discharged home to be followed up on an outpatient basis to determine if there is any recurring concern  of pituitary insufficiency. She was noted to have low potassium and this was replenished prior to her discharge. Potassium prior to her discharge was 3.9 They  benefited maximally from their hospital stay and there were no complications.    Recent vital signs:  Filed Vitals:   03/23/12 1401  BP: 102/68  Pulse: 70  Temp: 98.7 F (37.1 C)  Resp: 18    Recent laboratory studies:  Results for orders placed during the hospital encounter of 03/19/12  CBC WITH DIFFERENTIAL      Component Value Range   WBC 8.8  4.0 - 10.5 K/uL   RBC 3.75 (*) 3.87 - 5.11 MIL/uL   Hemoglobin 11.8 (*) 12.0 - 15.0 g/dL   HCT 04.5 (*) 40.9 - 81.1 %   MCV 93.3  78.0 - 100.0 fL   MCH 31.5  26.0 - 34.0 pg   MCHC 33.7  30.0 - 36.0 g/dL   RDW 91.4  78.2 - 95.6 %   Platelets 202  150 - 400 K/uL   Neutrophils Relative 54  43 - 77 %   Neutro Abs 4.7  1.7 - 7.7 K/uL   Lymphocytes Relative 36  12 - 46 %   Lymphs Abs 3.2  0.7 - 4.0 K/uL   Monocytes Relative 6  3 - 12 %   Monocytes Absolute 0.5  0.1 - 1.0 K/uL   Eosinophils Relative 5  0 - 5 %   Eosinophils Absolute 0.4  0.0 - 0.7 K/uL   Basophils Relative 0  0 - 1 %   Basophils Absolute 0.0  0.0 - 0.1 K/uL  COMPREHENSIVE METABOLIC PANEL      Component Value Range   Sodium 141  135 - 145 mEq/L   Potassium 3.2 (*) 3.5 - 5.1 mEq/L   Chloride 105  96 - 112 mEq/L   CO2 27  19 - 32 mEq/L   Glucose, Bld 99  70 - 99 mg/dL   BUN 11  6 - 23 mg/dL   Creatinine, Ser 2.13  0.50 - 1.10 mg/dL   Calcium 8.5  8.4 - 08.6 mg/dL   Total Protein 5.4 (*) 6.0 - 8.3 g/dL   Albumin 2.9 (*) 3.5 - 5.2 g/dL   AST 17  0 - 37 U/L   ALT 20  0 - 35 U/L   Alkaline Phosphatase 54  39 - 117 U/L   Total Bilirubin 0.2 (*) 0.3 - 1.2 mg/dL   GFR calc non Af Amer 84 (*) >90 mL/min   GFR calc Af Amer >90  >90 mL/min  BASIC METABOLIC PANEL      Component Value Range   Sodium 142  135 - 145 mEq/L   Potassium 3.9  3.5 - 5.1 mEq/L   Chloride 108  96 - 112 mEq/L   CO2 26  19 - 32 mEq/L   Glucose, Bld 86  70 - 99 mg/dL   BUN 12  6 - 23 mg/dL   Creatinine, Ser 5.78  0.50 - 1.10 mg/dL   Calcium 8.9  8.4 - 46.9 mg/dL   GFR calc non Af Amer 86 (*) >90  mL/min   GFR calc Af Amer >90  >90 mL/min  CBC      Component Value Range   WBC 6.4  4.0 - 10.5 K/uL   RBC 3.82 (*)  3.87 - 5.11 MIL/uL   Hemoglobin 12.1  12.0 - 15.0 g/dL   HCT 96.0 (*) 45.4 - 09.8 %   MCV 93.7  78.0 - 100.0 fL   MCH 31.7  26.0 - 34.0 pg   MCHC 33.8  30.0 - 36.0 g/dL   RDW 11.9  14.7 - 82.9 %   Platelets 195  150 - 400 K/uL  POTASSIUM      Component Value Range   Potassium 4.1  3.5 - 5.1 mEq/L    Discharge Medications:   Medication List  As of 03/23/2012  5:44 PM   STOP taking these medications         hydrochlorothiazide 25 MG tablet      HYDROmorphone 2 MG tablet      oxyCODONE-acetaminophen 10-325 MG per tablet         TAKE these medications         escitalopram 20 MG tablet   Commonly known as: LEXAPRO   Take 20 mg by mouth daily.      morphine 15 MG tablet   Commonly known as: MSIR   Take 15-30 mg by mouth every 6 (six) hours as needed. For pain.      oxyCODONE-acetaminophen 5-325 MG per tablet   Commonly known as: PERCOCET   Take 1-2 tablets by mouth every 6 (six) hours as needed for pain.      rosuvastatin 10 MG tablet   Commonly known as: CRESTOR   Take 10 mg by mouth daily.      topiramate 100 MG tablet   Commonly known as: TOPAMAX   Take 150 mg by mouth every morning. For migraines.            Diagnostic Studies: Dg Chest 2 View  03/09/2012  *RADIOLOGY REPORT*  Clinical Data: Preadmission radiograph.  Preoperative for back operation.  CHEST - 2 VIEW  Comparison: None.  Findings:  Cardiopericardial silhouette within normal limits. Mediastinal contours normal. Trachea midline.  No airspace disease or effusion.  Small calcified granuloma versus vessel on and at the right lung base.  IMPRESSION: No active cardiopulmonary disease.  Original Report Authenticated By: Andreas Newport, M.D.   Dg Lumbar Spine 1 View  03/19/2012  *RADIOLOGY REPORT*  Clinical Data: Microdiskectomy  LUMBAR SPINE - 1 VIEW  Comparison: Myelography 02/15/2012   Findings: Single lateral C-arm image shows probes directed at the pedicle levels of L4 and L5.  IMPRESSION: L4 and L5 pedicle levels localized.  Original Report Authenticated By: Thomasenia Sales, M.D.    Disposition:   Discharge Orders    Future Orders Please Complete By Expires   Diet - low sodium heart healthy      Call MD / Call 911      Comments:   If you experience chest pain or shortness of breath, CALL 911 and be transported to the hospital emergency room.  If you develope a fever above 101 F, pus (white drainage) or increased drainage or redness at the wound, or calf pain, call your surgeon's office.   Constipation Prevention      Comments:   Drink plenty of fluids.  Prune juice may be helpful.  You may use a stool softener, such as Colace (over the counter) 100 mg twice a day.  Use MiraLax (over the counter) for constipation as needed.   Increase activity slowly as tolerated      Discharge instructions      Comments:   No lifting greater than 10 lbs.  Avoid bending, stooping and twisting. Walk in house for first week them may start to get out slowly increasing distance up to one mile by 6-8weeks post op. Keep incision dry for 3 days, may use tegaderm or similar water impervious dressing. Drink at least 5 glasses of water or fluid per day. May use an over the counter laxative to keep bowels working recommend Miralax one tablespoon mixed with 8 oz of water or juice a day or pericolace over the counter.   Driving restrictions      Comments:   No driving for 2 weeks   Lifting restrictions      Comments:   No lifting for 6  weeks      Follow-up Information    Follow up with Macrae Wiegman E, MD. Schedule an appointment as soon as possible for a visit in 3 weeks.   Contact information:   [employer] [address] [place] [address] [phone]       Follow up with Hoyle Sauer, MD in 2 weeks.   Contact information:   [address] [facility] [place], [place]. [facility] [place] [address] [phone]           Signed: Kerrin Champagne 03/23/2012, 5:44 PM

## 2012-03-23 NOTE — Progress Notes (Signed)
Seen and agreed 03/23/2012 Anup [facility] PTA [phone] pager [phone] office    

## 2012-03-23 NOTE — Progress Notes (Signed)
Subjective: 4 Days Post-Op Procedure(s) (LRB): MICRODISCECTOMY LUMBAR LAMINECTOMY (Right)  Patient reports pain as moderate.    Objective:   VITALS:  Temp:  [97.2 F (36.2 C)-98.7 F (37.1 C)] 98.7 F (37.1 C) (06/28 1401) Pulse Rate:  [70-77] 70  (06/28 1401) Resp:  [18] 18  (06/28 1401) BP: (89-123)/(54-87) 102/68 mmHg (06/28 1401) SpO2:  [94 %-99 %] 94 % (06/28 1401) patient is awake alert oriented x4 she is in good spirits. To stay in physical therapy she has worked on stairs and ambulating with a walker. She is ready to go home with home health nursing assisting with physical therapy and intermittent occupational therapy.  Neurologically intact ABD soft Sensation intact distally Dorsiflexion/Plantar flexion intact Incision: dressing C/D/I   LABS  Basename 03/23/12 0725 03/22/12 1508  HGB 12.1 11.8*  WBC 6.4 8.8  PLT 195 202    Basename 03/23/12 1325 03/23/12 0725 03/22/12 1508  NA -- 142 141  K 4.1 3.9 --  CL -- 108 105  CO2 -- 26 27  BUN -- 12 11  CREATININE -- 0.81 0.83  GLUCOSE -- 86 99   No results found for this basename: LABPT:2,INR:2 in the last 72 hours   Assessment/Plan: 4 Days Post-Op Procedure(s) (LRB): MICRODISCECTOMY LUMBAR LAMINECTOMY (Right) Hypotension likely secondary to hypovolemia do to decrease input and diuretic. Results of this patient's pituitary axis tests still pending. Hypokalemia resolved.   D/C IV fluids Discharge home with home health Will need to followup with her primary care physician to determine if there is concern regarding adrenal axis abnormalities.  Yazen Rosko E 03/23/2012, 5:17 PM

## 2012-03-23 NOTE — Discharge Instructions (Signed)
    No lifting greater than 10 lbs. Avoid bending, stooping and twisting. Walk in house for first week them may start to get out slowly increasing distance up to one mile by 3 weeks post op. Keep incision dry for 3 days, may use tegaderm or similar water impervious dressing.  

## 2012-03-23 NOTE — Progress Notes (Signed)
Patient ID: [name] [name]  female  ZOX:[id number]    DOB: 03-23-1966    DOA: 03/19/2012  PCP: Hoyle Sauer, MD                                        Internal medicine consult followup   Subjective: No specific complaints   Objective: Weight change:   Intake/Output Summary (Last 24 hours) at 03/23/12 1122 Last data filed at 03/22/12 2124  Gross per 24 hour  Intake   1000 ml  Output      0 ml  Net   1000 ml   Blood pressure 103/69, pulse 74, temperature 98.1 F (36.7 C), temperature source Oral, resp. rate 18, height 5\' 9"  (1.753 m), weight 79.4 kg (175 lb 0.7 oz), SpO2 99.00%.  Physical Exam: General: Alert and awake, oriented x3, not in any acute distress. HEENT: anicteric sclera, pupils reactive to light and accommodation, EOMI CVS: S1-S2 clear, no murmur rubs or gallops Chest: clear to auscultation bilaterally, no wheezing, rales or rhonchi Abdomen: soft nontender, nondistended, normal bowel sounds, no organomegaly Extremities: no cyanosis, clubbing or edema noted bilaterally   Lab Results: Basic Metabolic Panel:  Lab 03/23/12 8119 03/22/12 1508  NA 142 141  K 3.9 3.2*  CL 108 105  CO2 26 27  GLUCOSE 86 99  BUN 12 11  CREATININE 0.81 0.83  CALCIUM 8.9 8.5  MG -- --  PHOS -- --   Liver Function Tests:  Lab 03/22/12 1508  AST 17  ALT 20  ALKPHOS 54  BILITOT 0.2*  PROT 5.4*  ALBUMIN 2.9*   CBC:  Lab 03/23/12 0725 03/22/12 1508  WBC 6.4 8.8  NEUTROABS -- 4.7  HGB 12.1 11.8*  HCT 35.8* 35.0*  MCV 93.7 93.3  PLT 195 202    Studies/Results: Dg Chest 2 View  03/09/2012  *RADIOLOGY REPORT*  Clinical Data: Preadmission radiograph.  Preoperative for back operation.  CHEST - 2 VIEW  Comparison: None.  Findings:  Cardiopericardial silhouette within normal limits. Mediastinal contours normal. Trachea midline.  No airspace disease or effusion.  Small calcified granuloma versus vessel on and at the right lung base.  IMPRESSION: No active cardiopulmonary  disease.  Original Report Authenticated By: Andreas Newport, M.D.   Dg Lumbar Spine 1 View  03/19/2012  *RADIOLOGY REPORT*  Clinical Data: Microdiskectomy  LUMBAR SPINE - 1 VIEW  Comparison: Myelography 02/15/2012  Findings: Single lateral C-arm image shows probes directed at the pedicle levels of L4 and L5.  IMPRESSION: L4 and L5 pedicle levels localized.  Original Report Authenticated By: Thomasenia Sales, M.D.    Medications: Scheduled Meds:   . atorvastatin  20 mg Oral q1800  . bisacodyl  10 mg Rectal Once  . cosyntropin  0.25 mg Intravenous Once  . docusate sodium  100 mg Oral BID  . escitalopram  20 mg Oral Daily  . potassium chloride  10 mEq Intravenous Q1 Hr x 3  . potassium chloride  40 mEq Oral BID  . sodium chloride  1,000 mL Intravenous Once  . sodium chloride  3 mL Intravenous Q12H  . topiramate  150 mg Oral q morning - 10a  . DISCONTD: hydrochlorothiazide  25 mg Oral Daily  . DISCONTD: potassium chloride  40 mEq Oral Once   Continuous Infusions:   . sodium chloride       Assessment/Plan: Principal Problem:  *Spinal stenosis  of lumbar region with neurogenic claudication Active Problems:  HNP (herniated nucleus pulposus), lumbar  Assessment/Plan:    Hypotension - most likely reason could be dehydration and her pain relief medications, somewhat improved after holding HCTZ and IV fluids - DC HCTZ - Cosyntropin test, a random cortisol level both pending (patient has been on 2 doses of steroid tapers outpatient) - Patient does not need any antihypertensives currently, however if her BP starts to rise, can place on a different agent for example Norvasc or ACE inhibitor.    Mild hypokalemia - resolved potassium 3.9 ,could be from HCTZ which is dc'ed.    hyperlipidemia - continue statins.    Anemia - H&H stable     disposition: Per the primary service, hopefully on the weekend, appears to be somewhat stable now from a medical standpoint    LOS: 4 days    Simra Fiebig M.D. [facility] 03/23/2012, 11:22 AM Pager: [phone]  If 7PM-7AM, please contact night-coverage [email or web address] Password [email or web address]

## 2012-03-24 LAB — ACTH STIMULATION, 3 TIME POINTS
Cortisol, 30 Min: 14.8 ug/dL — ABNORMAL LOW (ref >20)
Cortisol, 60 Min: 18.6 ug/dL — ABNORMAL LOW (ref >20)

## 2012-06-27 ENCOUNTER — Other Ambulatory Visit: Payer: Self-pay | Admitting: Physician Assistant

## 2012-06-27 DIAGNOSIS — M545 Low back pain, unspecified: Secondary | ICD-10-CM

## 2012-06-28 ENCOUNTER — Ambulatory Visit
Admission: RE | Admit: 2012-06-28 | Discharge: 2012-06-28 | Disposition: A | Discharge status: Home / Self Care | Payer: BC Managed Care – PPO | Source: Ambulatory Visit | Attending: Physician Assistant | Admitting: Physician Assistant

## 2012-06-28 DIAGNOSIS — M545 Low back pain, unspecified: Secondary | ICD-10-CM

## 2012-06-28 MED ORDER — GADOBENATE DIMEGLUMINE 529 MG/ML IV SOLN
16.0000 mL | Freq: Once | INTRAVENOUS | Status: AC | PRN
  Administered 2012-06-28: 16 mL via INTRAVENOUS

## 2012-07-20 ENCOUNTER — Other Ambulatory Visit: Payer: Self-pay | Admitting: Specialist

## 2012-07-20 ENCOUNTER — Ambulatory Visit
Admission: RE | Admit: 2012-07-20 | Discharge: 2012-07-20 | Disposition: A | Discharge status: Home / Self Care | Payer: BC Managed Care – PPO | Source: Ambulatory Visit | Attending: Specialist | Admitting: Specialist

## 2012-07-20 DIAGNOSIS — M545 Low back pain, unspecified: Secondary | ICD-10-CM

## 2012-07-20 MED ORDER — GADOBENATE DIMEGLUMINE 529 MG/ML IV SOLN
16.0000 mL | Freq: Once | INTRAVENOUS | Status: AC | PRN
  Administered 2012-07-20: 16 mL via INTRAVENOUS

## 2012-07-23 ENCOUNTER — Other Ambulatory Visit (HOSPITAL_COMMUNITY): Payer: Self-pay | Admitting: Specialist

## 2012-07-24 ENCOUNTER — Encounter (HOSPITAL_COMMUNITY): Payer: Self-pay

## 2012-07-24 ENCOUNTER — Encounter (HOSPITAL_COMMUNITY)
Admission: RE | Admit: 2012-07-24 | Discharge: 2012-07-24 | Disposition: A | Discharge status: Home / Self Care | Payer: BC Managed Care – PPO | Source: Ambulatory Visit | Attending: Specialist | Admitting: Specialist

## 2012-07-24 HISTORY — DX: Major depressive disorder, single episode, unspecified: F32.9

## 2012-07-24 HISTORY — DX: Other adrenocortical insufficiency: E27.49

## 2012-07-24 HISTORY — DX: Malignant (primary) neoplasm, unspecified: C80.1

## 2012-07-24 HISTORY — DX: Depression, unspecified: F32.A

## 2012-07-24 LAB — COMPREHENSIVE METABOLIC PANEL
ALT: 347 U/L — ABNORMAL HIGH (ref 0–35)
AST: 105 U/L — ABNORMAL HIGH (ref 0–37)
Albumin: 3.8 g/dL (ref 3.5–5.2)
Alkaline Phosphatase: 111 U/L (ref 39–117)
CO2: 24 mEq/L (ref 19–32)
Chloride: 100 mEq/L (ref 96–112)
Creatinine, Ser: 0.69 mg/dL (ref 0.50–1.10)
GFR calc non Af Amer: >90 mL/min (ref >90)
Potassium: 3.6 mEq/L (ref 3.5–5.1)
Sodium: 135 mEq/L (ref 135–145)
Total Bilirubin: 0.2 mg/dL — ABNORMAL LOW (ref 0.3–1.2)

## 2012-07-24 LAB — SURGICAL PCR SCREEN: MRSA, PCR: NEGATIVE

## 2012-07-24 LAB — CBC
MCV: 93.7 fL (ref 78.0–100.0)
Platelets: 247 10*3/uL (ref 150–400)
RBC: 4.31 MIL/uL (ref 3.87–5.11)
RDW: 13.1 % (ref 11.5–15.5)
WBC: 7.8 10*3/uL (ref 4.0–10.5)

## 2012-07-24 LAB — URINALYSIS, ROUTINE W REFLEX MICROSCOPIC
Bilirubin Urine: NEGATIVE
Glucose, UA: NEGATIVE mg/dL
Hgb urine dipstick: NEGATIVE
Ketones, ur: NEGATIVE mg/dL
Protein, ur: NEGATIVE mg/dL
Urobilinogen, UA: 0.2 mg/dL (ref 0.0–1.0)

## 2012-07-24 MED ORDER — CLINDAMYCIN PHOSPHATE 600 MG/50ML IV SOLN
600.0000 mg | INTRAVENOUS | Status: DC
  Filled 2012-07-24 (×2): qty 50

## 2012-07-24 NOTE — Progress Notes (Signed)
Blood samples for CBC, CMP not received in lab, may need repeat DOS.

## 2012-07-24 NOTE — Pre-Procedure Instructions (Signed)
20 [name] [name]  07/24/2012   Your procedure is scheduled on:  07/25/2012  Report to [facility] Stay Center at call by 8a.m. Call this number if you have problems the morning of surgery: [phone]   Remember:   Do not eat food or drink liquid :After Midnight. TUESDAY      Take these medicines the morning of surgery with A SIP OF WATER: Neurontin, Dilaudid,Oxycontin, Lexapro, Topamax, Wellbutrin     Do not wear jewelry, make-up or nail polish.  Do not wear lotions, powders, or perfumes. You may wear deodorant.  Do not shave 48 hours prior to surgery.   Do not bring valuables to the hospital.  Contacts, dentures or bridgework may not be worn into surgery.  Leave suitcase in the car. After surgery it may be brought to your room.  For patients admitted to the hospital, checkout time is 11:00 AM the day of discharge.   Patients discharged the day of surgery will not be allowed to drive home.  Name and phone number of your driver: /w spouse   Special Instructions: Shower using CHG 2 nights before surgery and the night before surgery.  If you shower the day of surgery use CHG.  Use special wash - you have one bottle of CHG for all showers.  You should use approximately 1/3 of the bottle for each shower.   Please read over the following fact sheets that you were given: Pain Booklet, Coughing and Deep Breathing, MRSA Information and Surgical Site Infection Prevention

## 2012-07-25 ENCOUNTER — Encounter (HOSPITAL_COMMUNITY): Payer: Self-pay | Admitting: Anesthesiology

## 2012-07-25 ENCOUNTER — Encounter (HOSPITAL_COMMUNITY): Payer: Self-pay

## 2012-07-25 ENCOUNTER — Encounter (HOSPITAL_COMMUNITY)
Admission: RE | Disposition: A | Discharge status: Home Health | Payer: Self-pay | Source: Ambulatory Visit | Attending: Specialist

## 2012-07-25 ENCOUNTER — Ambulatory Visit (HOSPITAL_COMMUNITY): Payer: BC Managed Care – PPO | Admitting: Anesthesiology

## 2012-07-25 ENCOUNTER — Ambulatory Visit (HOSPITAL_COMMUNITY): Payer: BC Managed Care – PPO

## 2012-07-25 ENCOUNTER — Observation Stay (HOSPITAL_COMMUNITY)
Admission: RE | Admit: 2012-07-25 | Discharge: 2012-07-27 | DRG: 758 | Disposition: A | Discharge status: Home Health | Payer: BC Managed Care – PPO | Source: Ambulatory Visit | Attending: Specialist | Admitting: Specialist

## 2012-07-25 DIAGNOSIS — Z01812 Encounter for preprocedural laboratory examination: Secondary | ICD-10-CM

## 2012-07-25 DIAGNOSIS — Z79899 Other long term (current) drug therapy: Secondary | ICD-10-CM

## 2012-07-25 DIAGNOSIS — I10 Essential (primary) hypertension: Secondary | ICD-10-CM | POA: Diagnosis present

## 2012-07-25 DIAGNOSIS — Z85820 Personal history of malignant melanoma of skin: Secondary | ICD-10-CM

## 2012-07-25 DIAGNOSIS — F3289 Other specified depressive episodes: Secondary | ICD-10-CM | POA: Diagnosis present

## 2012-07-25 DIAGNOSIS — E7800 Pure hypercholesterolemia, unspecified: Secondary | ICD-10-CM | POA: Insufficient documentation

## 2012-07-25 DIAGNOSIS — G43909 Migraine, unspecified, not intractable, without status migrainosus: Secondary | ICD-10-CM | POA: Diagnosis present

## 2012-07-25 DIAGNOSIS — F329 Major depressive disorder, single episode, unspecified: Secondary | ICD-10-CM | POA: Diagnosis present

## 2012-07-25 DIAGNOSIS — M5126 Other intervertebral disc displacement, lumbar region: Principal | ICD-10-CM

## 2012-07-25 HISTORY — DX: Plasmodium vivax malaria without complication: B51.9

## 2012-07-25 HISTORY — DX: Pure hypercholesterolemia, unspecified: E78.00

## 2012-07-25 HISTORY — PX: LAMINECTOMY AND MICRODISCECTOMY LUMBAR SPINE: SHX1913

## 2012-07-25 HISTORY — DX: Migraine, unspecified, not intractable, without status migrainosus: G43.909

## 2012-07-25 HISTORY — PX: LUMBAR LAMINECTOMY: SHX95

## 2012-07-25 HISTORY — DX: Personal history of other diseases of the respiratory system: Z87.09

## 2012-07-25 HISTORY — DX: Pneumonia, unspecified organism: J18.9

## 2012-07-25 HISTORY — DX: Personal history of other medical treatment: Z92.89

## 2012-07-25 SURGERY — MICRODISCECTOMY LUMBAR LAMINECTOMY
Anesthesia: General | Site: Spine Lumbar | Wound class: Clean

## 2012-07-25 MED ORDER — ATORVASTATIN CALCIUM 10 MG PO TABS
10.0000 mg | ORAL_TABLET | Freq: Every day | ORAL | Status: DC
  Administered 2012-07-26: 10 mg via ORAL
  Filled 2012-07-25 (×2): qty 1

## 2012-07-25 MED ORDER — BUPIVACAINE-EPINEPHRINE 0.5% -1:200000 IJ SOLN
INTRAMUSCULAR | Status: DC | PRN
  Administered 2012-07-25: 10 mL

## 2012-07-25 MED ORDER — FENTANYL CITRATE 0.05 MG/ML IJ SOLN
INTRAMUSCULAR | Status: AC
  Filled 2012-07-25: qty 2

## 2012-07-25 MED ORDER — ZOLPIDEM TARTRATE 5 MG PO TABS: 5.0000 mg | ORAL_TABLET | Freq: Every evening | ORAL | Status: DC | PRN

## 2012-07-25 MED ORDER — GABAPENTIN 300 MG PO CAPS
300.0000 mg | ORAL_CAPSULE | Freq: Two times a day (BID) | ORAL | Status: DC
  Administered 2012-07-26 – 2012-07-27 (×3): 300 mg via ORAL
  Filled 2012-07-25 (×4): qty 1

## 2012-07-25 MED ORDER — OXYCODONE-ACETAMINOPHEN 5-325 MG PO TABS
1.0000 | ORAL_TABLET | ORAL | Status: DC | PRN
  Administered 2012-07-26 – 2012-07-27 (×6): 2 via ORAL
  Filled 2012-07-25 (×6): qty 2

## 2012-07-25 MED ORDER — KETOROLAC TROMETHAMINE 30 MG/ML IJ SOLN
30.0000 mg | Freq: Once | INTRAMUSCULAR | Status: AC
  Administered 2012-07-25: 30 mg via INTRAVENOUS
  Filled 2012-07-25: qty 1

## 2012-07-25 MED ORDER — ARTIFICIAL TEARS OP OINT
TOPICAL_OINTMENT | OPHTHALMIC | Status: DC | PRN
  Administered 2012-07-25: 1 via OPHTHALMIC

## 2012-07-25 MED ORDER — KCL IN DEXTROSE-NACL 20-5-0.45 MEQ/L-%-% IV SOLN
INTRAVENOUS | Status: DC
  Administered 2012-07-26 (×3): via INTRAVENOUS
  Filled 2012-07-25 (×4): qty 1000

## 2012-07-25 MED ORDER — TOPIRAMATE 25 MG PO TABS
150.0000 mg | ORAL_TABLET | Freq: Every morning | ORAL | Status: DC
  Administered 2012-07-26 – 2012-07-27 (×2): 150 mg via ORAL
  Filled 2012-07-25 (×2): qty 2

## 2012-07-25 MED ORDER — MIDAZOLAM HCL 5 MG/5ML IJ SOLN
INTRAMUSCULAR | Status: DC | PRN
  Administered 2012-07-25: 2 mg via INTRAVENOUS

## 2012-07-25 MED ORDER — ACETAMINOPHEN 325 MG PO TABS: 650.0000 mg | ORAL_TABLET | ORAL | Status: DC | PRN

## 2012-07-25 MED ORDER — ONDANSETRON HCL 4 MG/2ML IJ SOLN
INTRAMUSCULAR | Status: DC | PRN
  Administered 2012-07-25: 4 mg via INTRAVENOUS

## 2012-07-25 MED ORDER — HYDROMORPHONE HCL PF 1 MG/ML IJ SOLN
INTRAMUSCULAR | Status: AC
  Administered 2012-07-25: 0.5 mg via INTRAVENOUS
  Filled 2012-07-25: qty 1

## 2012-07-25 MED ORDER — BISACODYL 10 MG RE SUPP: 10.0000 mg | Freq: Every day | RECTAL | Status: DC | PRN

## 2012-07-25 MED ORDER — ESCITALOPRAM OXALATE 20 MG PO TABS
20.0000 mg | ORAL_TABLET | Freq: Every day | ORAL | Status: DC
  Administered 2012-07-26 – 2012-07-27 (×2): 20 mg via ORAL
  Filled 2012-07-25 (×3): qty 1

## 2012-07-25 MED ORDER — HYDROMORPHONE HCL PF 1 MG/ML IJ SOLN
0.2500 mg | INTRAMUSCULAR | Status: DC | PRN
  Administered 2012-07-25 (×2): 0.5 mg via INTRAVENOUS

## 2012-07-25 MED ORDER — HYDROMORPHONE HCL PF 1 MG/ML IJ SOLN
0.5000 mg | INTRAMUSCULAR | Status: DC | PRN
  Administered 2012-07-26 – 2012-07-27 (×2): 1 mg via INTRAVENOUS
  Filled 2012-07-25 (×2): qty 1

## 2012-07-25 MED ORDER — ONDANSETRON HCL 4 MG/2ML IJ SOLN: 4.0000 mg | INTRAMUSCULAR | Status: DC | PRN

## 2012-07-25 MED ORDER — SODIUM CHLORIDE 0.9 % IV SOLN: 250.0000 mL | INTRAVENOUS | Status: DC

## 2012-07-25 MED ORDER — THROMBIN 20000 UNITS EX SOLR
CUTANEOUS | Status: AC
  Filled 2012-07-25: qty 20000

## 2012-07-25 MED ORDER — SODIUM CHLORIDE 0.9 % IJ SOLN: 3.0000 mL | INTRAMUSCULAR | Status: DC | PRN

## 2012-07-25 MED ORDER — KETOROLAC TROMETHAMINE 30 MG/ML IJ SOLN
INTRAMUSCULAR | Status: AC
  Administered 2012-07-25: 30 mg via INTRAVENOUS
  Filled 2012-07-25: qty 1

## 2012-07-25 MED ORDER — GLYCOPYRROLATE 0.2 MG/ML IJ SOLN
INTRAMUSCULAR | Status: DC | PRN
  Administered 2012-07-25: .6 mg via INTRAVENOUS

## 2012-07-25 MED ORDER — LACTATED RINGERS IV SOLN
INTRAVENOUS | Status: DC
  Administered 2012-07-25: 50 mL/h via INTRAVENOUS

## 2012-07-25 MED ORDER — PHENOL 1.4 % MT LIQD: 1.0000 | OROMUCOSAL | Status: DC | PRN

## 2012-07-25 MED ORDER — MENTHOL 3 MG MT LOZG: 1.0000 | LOZENGE | OROMUCOSAL | Status: DC | PRN

## 2012-07-25 MED ORDER — FENTANYL CITRATE 0.05 MG/ML IJ SOLN
INTRAMUSCULAR | Status: DC | PRN
  Administered 2012-07-25: 200 ug via INTRAVENOUS
  Administered 2012-07-25: 50 ug via INTRAVENOUS

## 2012-07-25 MED ORDER — LIDOCAINE HCL (CARDIAC) 20 MG/ML IV SOLN
INTRAVENOUS | Status: DC | PRN
  Administered 2012-07-25: 80 mg via INTRAVENOUS

## 2012-07-25 MED ORDER — BUPROPION HCL ER (SR) 150 MG PO TB12
150.0000 mg | ORAL_TABLET | Freq: Every day | ORAL | Status: DC
  Administered 2012-07-26 – 2012-07-27 (×2): 150 mg via ORAL
  Filled 2012-07-25 (×2): qty 1

## 2012-07-25 MED ORDER — MELOXICAM 15 MG PO TABS
15.0000 mg | ORAL_TABLET | Freq: Every day | ORAL | Status: DC
  Administered 2012-07-26: 15 mg via ORAL
  Filled 2012-07-25: qty 1

## 2012-07-25 MED ORDER — FLEET ENEMA 7-19 GM/118ML RE ENEM: 1.0000 | ENEMA | Freq: Once | RECTAL | Status: AC | PRN

## 2012-07-25 MED ORDER — HYDROCORTISONE SOD SUCCINATE 100 MG PF FOR IT USE: 100.0000 mg | Freq: Three times a day (TID) | INTRAMUSCULAR | Status: DC

## 2012-07-25 MED ORDER — HYDROCORTISONE SOD SUCCINATE 100 MG IJ SOLR
100.0000 mg | INTRAMUSCULAR | Status: AC
  Administered 2012-07-25: 100 mg via INTRAVENOUS
  Filled 2012-07-25 (×2): qty 2

## 2012-07-25 MED ORDER — CLINDAMYCIN PHOSPHATE 600 MG/50ML IV SOLN
INTRAVENOUS | Status: DC | PRN
  Administered 2012-07-25: 600 mg via INTRAVENOUS

## 2012-07-25 MED ORDER — MIDAZOLAM HCL 2 MG/2ML IJ SOLN
INTRAMUSCULAR | Status: AC
  Filled 2012-07-25: qty 2

## 2012-07-25 MED ORDER — LACTATED RINGERS IV SOLN
INTRAVENOUS | Status: DC | PRN
  Administered 2012-07-25 (×2): via INTRAVENOUS

## 2012-07-25 MED ORDER — FENTANYL CITRATE 0.05 MG/ML IJ SOLN
50.0000 ug | INTRAMUSCULAR | Status: DC | PRN
  Administered 2012-07-25 (×2): 50 ug via INTRAVENOUS
  Administered 2012-07-25: 100 ug via INTRAVENOUS

## 2012-07-25 MED ORDER — BUPIVACAINE-EPINEPHRINE (PF) 0.5% -1:200000 IJ SOLN
INTRAMUSCULAR | Status: AC
  Filled 2012-07-25: qty 10

## 2012-07-25 MED ORDER — ROCURONIUM BROMIDE 100 MG/10ML IV SOLN
INTRAVENOUS | Status: DC | PRN
  Administered 2012-07-25: 50 mg via INTRAVENOUS
  Administered 2012-07-25: 10 mg via INTRAVENOUS
  Administered 2012-07-25: 5 mg via INTRAVENOUS

## 2012-07-25 MED ORDER — NEOSTIGMINE METHYLSULFATE 1 MG/ML IJ SOLN
INTRAMUSCULAR | Status: DC | PRN
  Administered 2012-07-25: 4 mg via INTRAVENOUS

## 2012-07-25 MED ORDER — THROMBIN 20000 UNITS EX SOLR
OROMUCOSAL | Status: DC | PRN
  Administered 2012-07-25: 20:00:00 via TOPICAL

## 2012-07-25 MED ORDER — ALUM & MAG HYDROXIDE-SIMETH 200-200-20 MG/5ML PO SUSP: 30.0000 mL | Freq: Four times a day (QID) | ORAL | Status: DC | PRN

## 2012-07-25 MED ORDER — PROPOFOL 10 MG/ML IV BOLUS
INTRAVENOUS | Status: DC | PRN
  Administered 2012-07-25: 150 mg via INTRAVENOUS

## 2012-07-25 MED ORDER — PANTOPRAZOLE SODIUM 40 MG IV SOLR
40.0000 mg | Freq: Every day | INTRAVENOUS | Status: DC
  Administered 2012-07-26: 40 mg via INTRAVENOUS
  Filled 2012-07-25 (×2): qty 40

## 2012-07-25 MED ORDER — DOCUSATE SODIUM 100 MG PO CAPS
100.0000 mg | ORAL_CAPSULE | Freq: Two times a day (BID) | ORAL | Status: DC
  Administered 2012-07-26 – 2012-07-27 (×3): 100 mg via ORAL
  Filled 2012-07-25 (×4): qty 1

## 2012-07-25 MED ORDER — CYCLOBENZAPRINE HCL 10 MG PO TABS
10.0000 mg | ORAL_TABLET | Freq: Three times a day (TID) | ORAL | Status: DC | PRN
  Administered 2012-07-26 – 2012-07-27 (×3): 10 mg via ORAL
  Filled 2012-07-25 (×3): qty 1

## 2012-07-25 MED ORDER — POLYETHYLENE GLYCOL 3350 17 G PO PACK: 17.0000 g | PACK | Freq: Every day | ORAL | Status: DC | PRN

## 2012-07-25 MED ORDER — ACETAMINOPHEN 650 MG RE SUPP: 650.0000 mg | RECTAL | Status: DC | PRN

## 2012-07-25 MED ORDER — HYDROCORTISONE SOD SUCCINATE 100 MG IJ SOLR
100.0000 mg | Freq: Three times a day (TID) | INTRAMUSCULAR | Status: AC
  Administered 2012-07-26 (×3): 100 mg via INTRAVENOUS
  Filled 2012-07-25 (×3): qty 2

## 2012-07-25 MED ORDER — CLINDAMYCIN PHOSPHATE 600 MG/50ML IV SOLN
600.0000 mg | Freq: Once | INTRAVENOUS | Status: DC
  Filled 2012-07-25: qty 50

## 2012-07-25 MED ORDER — SODIUM CHLORIDE 0.9 % IJ SOLN: 3.0000 mL | Freq: Two times a day (BID) | INTRAMUSCULAR | Status: DC

## 2012-07-25 MED ORDER — OXYCODONE HCL ER 10 MG PO T12A
20.0000 mg | EXTENDED_RELEASE_TABLET | Freq: Two times a day (BID) | ORAL | Status: DC
  Administered 2012-07-26 – 2012-07-27 (×4): 20 mg via ORAL
  Filled 2012-07-25 (×4): qty 2

## 2012-07-25 SURGICAL SUPPLY — 52 items
BUR RND FLUTED 2.5 (BURR) IMPLANT
BUR ROUND FLUTED 4 SOFT TCH (BURR) IMPLANT
BUR SABER RD CUTTING 3.0 (BURR) ×2 IMPLANT
CANISTER SUCTION 2500CC (MISCELLANEOUS) ×2 IMPLANT
CLOTH BEACON ORANGE TIMEOUT ST (SAFETY) ×2 IMPLANT
CORDS BIPOLAR (ELECTRODE) ×2 IMPLANT
COVER SURGICAL LIGHT HANDLE (MISCELLANEOUS) ×2 IMPLANT
DERMABOND ADVANCED (GAUZE/BANDAGES/DRESSINGS) ×1
DERMABOND ADVANCED .7 DNX12 (GAUZE/BANDAGES/DRESSINGS) ×1 IMPLANT
DRAPE C-ARM 42X72 X-RAY (DRAPES) ×2 IMPLANT
DRAPE MICROSCOPE LEICA (MISCELLANEOUS) ×2 IMPLANT
DRAPE POUCH INSTRU U-SHP 10X18 (DRAPES) ×2 IMPLANT
DRAPE PROXIMA HALF (DRAPES) ×4 IMPLANT
DRAPE SURG 17X23 STRL (DRAPES) ×8 IMPLANT
DRSG MEPILEX BORDER 4X4 (GAUZE/BANDAGES/DRESSINGS) IMPLANT
DRSG MEPILEX BORDER 4X8 (GAUZE/BANDAGES/DRESSINGS) ×2 IMPLANT
DURAPREP 26ML APPLICATOR (WOUND CARE) ×2 IMPLANT
ELECT BLADE 4.0 EZ CLEAN MEGAD (MISCELLANEOUS) ×2
ELECT CAUTERY BLADE 6.4 (BLADE) ×2 IMPLANT
ELECT REM PT RETURN 9FT ADLT (ELECTROSURGICAL) ×2
ELECTRODE BLDE 4.0 EZ CLN MEGD (MISCELLANEOUS) ×1 IMPLANT
ELECTRODE REM PT RTRN 9FT ADLT (ELECTROSURGICAL) ×1 IMPLANT
GLOVE BIOGEL PI IND STRL 7.5 (GLOVE) ×1 IMPLANT
GLOVE BIOGEL PI INDICATOR 7.5 (GLOVE) ×1
GLOVE ECLIPSE 7.0 STRL STRAW (GLOVE) ×2 IMPLANT
GLOVE ECLIPSE 8.5 STRL (GLOVE) ×2 IMPLANT
GLOVE SURG 8.5 LATEX PF (GLOVE) ×2 IMPLANT
GOWN PREVENTION PLUS LG XLONG (DISPOSABLE) IMPLANT
GOWN PREVENTION PLUS XXLARGE (GOWN DISPOSABLE) ×2 IMPLANT
GOWN STRL NON-REIN LRG LVL3 (GOWN DISPOSABLE) ×4 IMPLANT
KIT BASIN OR (CUSTOM PROCEDURE TRAY) ×2 IMPLANT
KIT ROOM TURNOVER OR (KITS) ×2 IMPLANT
NEEDLE 22X1 1/2 (OR ONLY) (NEEDLE) ×2 IMPLANT
NEEDLE SPNL 18GX3.5 QUINCKE PK (NEEDLE) ×4 IMPLANT
NS IRRIG 1000ML POUR BTL (IV SOLUTION) ×2 IMPLANT
PACK LAMINECTOMY ORTHO (CUSTOM PROCEDURE TRAY) ×2 IMPLANT
PAD ARMBOARD 7.5X6 YLW CONV (MISCELLANEOUS) ×4 IMPLANT
PATTIES SURGICAL .5 X.5 (GAUZE/BANDAGES/DRESSINGS) IMPLANT
PATTIES SURGICAL .75X.75 (GAUZE/BANDAGES/DRESSINGS) IMPLANT
SPONGE LAP 4X18 X RAY DECT (DISPOSABLE) IMPLANT
SPONGE SURGIFOAM ABS GEL 100 (HEMOSTASIS) ×2 IMPLANT
SUT VIC AB 1 CT1 27 (SUTURE)
SUT VIC AB 1 CT1 27XBRD ANBCTR (SUTURE) IMPLANT
SUT VIC AB 2-0 CT1 27 (SUTURE)
SUT VIC AB 2-0 CT1 TAPERPNT 27 (SUTURE) IMPLANT
SUT VICRYL 0 UR6 27IN ABS (SUTURE) IMPLANT
SUT VICRYL 4-0 PS2 18IN ABS (SUTURE) IMPLANT
SYR CONTROL 10ML LL (SYRINGE) ×2 IMPLANT
TOWEL OR 17X24 6PK STRL BLUE (TOWEL DISPOSABLE) ×2 IMPLANT
TOWEL OR 17X26 10 PK STRL BLUE (TOWEL DISPOSABLE) ×2 IMPLANT
TRAY FOLEY CATH 14FR (SET/KITS/TRAYS/PACK) ×2 IMPLANT
WATER STERILE IRR 1000ML POUR (IV SOLUTION) ×2 IMPLANT

## 2012-07-25 NOTE — Brief Op Note (Signed)
07/25/2012  7:12 PM  PATIENT:  [name] [name]  46 y.o. female  PRE-OPERATIVE DIAGNOSIS:  Severe right lateral recess stenosis with right disc protrusion/extrusion L4-5  POST-OPERATIVE DIAGNOSIS: Right L4-5 recurrent HNP, with large extruded fragment  PROCEDURE:  Procedure(s) (LRB) with comments: MICRODISCECTOMY LUMBAR LAMINECTOMY (N/A) - Re-do Right L4-5 Microdiscectomy  SURGEON:  Surgeon(s) and Role:    Kerrin Champagne, MD - Primary   ANESTHESIA:   local, general and Dr. Randa Evens.  EBL: 50cc    BLOOD ADMINISTERED:none and 0 CC PRBC  DRAINS: none foley during surgery discontinued at the end of the case.  LOCAL MEDICATIONS USED:  MARCAINE    and Amount: 10 ml  SPECIMEN:  No Specimen  DISPOSITION OF SPECIMEN:  N/A  COUNTS:  YES  TOURNIQUET:  * No tourniquets in log *  DICTATION: .Dragon Dictation  PLAN OF CARE: Admit to inpatient   PATIENT DISPOSITION:  PACU - hemodynamically stable.   Delay start of Pharmacological VTE agent (>24hrs) due to surgical blood loss or risk of bleeding: yes

## 2012-07-25 NOTE — H&P (Signed)
[name] [name] is an 46 y.o. female.   Chief Complaint:Back and lower extremity pain HPI: 46 year old female underwent right L3-4 lateral recess stenosis decompression and right L4-5 microdiskectomy 02/2012. Did well for 3 months then developed left leg pain , MRI 06/27/2012 with left L5-S1 lateral recess stenosis, previous right surgical Levels without abnormality. Did well with ESI left L5-S1 for 12 weeks then returned to work with sudden increasing right leg pain and numbness and weakness. 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 Having severe pain with increasing right lower extremity weakness. Positive sciatic tension, MRI with extruded HNP right L4-5. Not able to sleep due to pain. Unable to walk more than 15-20 feet due to pain. Taking Dilaudid and Oxycontin without relief. No bowel or bladder symptom.   Past Medical History  Diagnosis Date  . Hypertension     BP med. was d/c'd in 02/2012- post op  . Adrenal gland hypofunction     related to post op, vancomycin & lumbar injection, steroids    . Heart murmur     years ago  . Depression   . Headache     migraines  . Cancer     "early evolvng melanoma "- legs   . Arthritis     hnp-lumbar   . PONV (postoperative nausea and vomiting)     last 2 surgeries less nausea  . PONV (postoperative nausea and vomiting)     none with last surgery  . Blood dyscrasia     hx probable blood clot after ankle surgery  not definitive but treated per pt    Past Surgical History  Procedure Date  . Tonsillectomy   . Abdominal hysterectomy     1995 have Ovaries  . Cholecystectomy   . Ankle surgery     2010 right ankle  . Melanoma excision 06/2011    right lower leg  . Bladder tack   . Lumbar laminectomy 03/19/2012    Procedure: MICRODISCECTOMY LUMBAR LAMINECTOMY;  Surgeon: Kerrin Champagne, MD;  Location: [facility] OR;  Service: Orthopedics;  Laterality: Right;  MIS Right L3-4 lateral recess decompression and Right L4-5  Microdiscectomy  . Diagnostic laparoscopy     2 cysts removed fr. R ovary   . Breast surgery     left breast, benign     History reviewed. No pertinent family history. Social History:  reports that she has never smoked. She has never used smokeless tobacco. She reports that she drinks alcohol. She reports that she does not use illicit drugs.  Allergies:  Allergies  Allergen Reactions  . Penicillins Anaphylaxis  . Sulfa Antibiotics Hives and Other (See Comments)    Hives, mouth blisters   . Ciprofloxacin Hives  . Phenergan (Promethazine Hcl) Other (See Comments)    Caused "severe drop in blood pressure."  . Vancomycin Cross Reactors Rash    Medications Prior to Admission  Medication Sig Dispense Refill  . buPROPion (WELLBUTRIN SR) 150 MG 12 hr tablet Take 150 mg by mouth daily.       . cyclobenzaprine (FLEXERIL) 5 MG tablet Take 5 mg by mouth Twice daily as needed. For pain      . escitalopram (LEXAPRO) 20 MG tablet Take 20 mg by mouth daily before breakfast.       . gabapentin (NEURONTIN) 300 MG capsule Take 300 mg by mouth 2 (two) times daily at 10 AM and 5 PM.      . HYDROmorphone (DILAUDID) 2 MG tablet  Take 2 mg by mouth every 4 (four) hours as needed. For pain      . meloxicam (MOBIC) 15 MG tablet Take 15 mg by mouth daily.      Marland Kitchen oxyCODONE (OXYCONTIN) 20 MG 12 hr tablet Take 20 mg by mouth every 12 (twelve) hours.      . rosuvastatin (CRESTOR) 10 MG tablet Take 10 mg by mouth daily before breakfast.       . topiramate (TOPAMAX) 100 MG tablet Take 150 mg by mouth every morning. For migraines.      . zaleplon (SONATA) 10 MG capsule Take 10 mg by mouth At bedtime as needed. For sleep        Results for orders placed during the hospital encounter of 07/24/12 (from the past 48 hour(s))  SURGICAL PCR SCREEN     Status: Normal   Collection Time   07/24/12 12:18 PM      Component Value Range Comment   MRSA, PCR NEGATIVE  NEGATIVE    Staphylococcus aureus NEGATIVE  NEGATIVE     CBC     Status: Normal   Collection Time   07/24/12 12:23 PM      Component Value Range Comment   WBC 7.8  4.0 - 10.5 K/uL    RBC 4.31  3.87 - 5.11 MIL/uL    Hemoglobin 13.6  12.0 - 15.0 g/dL    HCT 65.7  84.6 - 96.2 %    MCV 93.7  78.0 - 100.0 fL    MCH 31.6  26.0 - 34.0 pg    MCHC 33.7  30.0 - 36.0 g/dL    RDW 95.2  84.1 - 32.4 %    Platelets 247  150 - 400 K/uL   COMPREHENSIVE METABOLIC PANEL     Status: Abnormal   Collection Time   07/24/12 12:23 PM      Component Value Range Comment   Sodium 135  135 - 145 mEq/L    Potassium 3.6  3.5 - 5.1 mEq/L    Chloride 100  96 - 112 mEq/L    CO2 24  19 - 32 mEq/L    Glucose, Bld 89  70 - 99 mg/dL    BUN 12  6 - 23 mg/dL    Creatinine, Ser 4.01  0.50 - 1.10 mg/dL    Calcium 9.4  8.4 - 02.7 mg/dL    Total Protein 7.0  6.0 - 8.3 g/dL    Albumin 3.8  3.5 - 5.2 g/dL    AST 253 (*) 0 - 37 U/L    ALT 347 (*) 0 - 35 U/L    Alkaline Phosphatase 111  39 - 117 U/L    Total Bilirubin 0.2 (*) 0.3 - 1.2 mg/dL    GFR calc non Af Amer >90  >90 mL/min    GFR calc Af Amer >90  >90 mL/min   URINALYSIS, ROUTINE W REFLEX MICROSCOPIC     Status: Normal   Collection Time   07/24/12 12:23 PM      Component Value Range Comment   Color, Urine YELLOW  YELLOW    APPearance CLEAR  CLEAR    Specific Gravity, Urine 1.019  1.005 - 1.030    pH 5.5  5.0 - 8.0    Glucose, UA NEGATIVE  NEGATIVE mg/dL    Hgb urine dipstick NEGATIVE  NEGATIVE    Bilirubin Urine NEGATIVE  NEGATIVE    Ketones, ur NEGATIVE  NEGATIVE mg/dL    Protein, ur NEGATIVE  NEGATIVE mg/dL    Urobilinogen, UA 0.2  0.0 - 1.0 mg/dL    Nitrite NEGATIVE  NEGATIVE    Leukocytes, UA NEGATIVE  NEGATIVE MICROSCOPIC NOT DONE ON URINES WITH NEGATIVE PROTEIN, BLOOD, LEUKOCYTES, NITRITE, OR GLUCOSE <1000 mg/dL.   No results found.  Review of Systems  Constitutional: Negative for fever, chills, weight loss, malaise/fatigue and diaphoresis.  HENT: Negative.  Negative for hearing loss, ear pain,  nosebleeds, congestion, sore throat, neck pain, tinnitus and ear discharge.   Eyes: Negative.  Negative for blurred vision, double vision, photophobia, pain, discharge and redness.  Respiratory: Negative.  Negative for cough, hemoptysis, sputum production, shortness of breath, wheezing and stridor.   Cardiovascular: Negative.  Negative for chest pain, palpitations, orthopnea, claudication, leg swelling and PND.  Gastrointestinal: Negative for heartburn, nausea, vomiting, abdominal pain, diarrhea, constipation, blood in stool and melena.  Genitourinary: Negative.  Negative for dysuria, urgency, frequency, hematuria and flank pain.  Musculoskeletal: Positive for back pain. Negative for myalgias, joint pain and falls.  Skin: Negative.  Negative for itching and rash.  Neurological: Positive for tingling, sensory change, focal weakness and weakness. Negative for dizziness, tremors, speech change, seizures, loss of consciousness and headaches.  Endo/Heme/Allergies: Negative for environmental allergies and polydipsia. Does not bruise/bleed easily.  Psychiatric/Behavioral: Negative for depression, suicidal ideas, hallucinations, memory loss and substance abuse. The patient is not nervous/anxious and does not have insomnia.     Blood pressure 113/78, pulse 83, temperature 98.2 F (36.8 C), temperature source Oral, resp. rate 16, SpO2 100.00%. Physical Exam  Constitutional: She is oriented to person, place, and time. She appears well-developed and well-nourished. No distress.  HENT:  Head: Normocephalic and atraumatic.  Right Ear: External ear normal.  Left Ear: External ear normal.  Nose: Nose normal.  Mouth/Throat: Oropharynx is clear and moist. No oropharyngeal exudate.  Eyes: Conjunctivae normal and EOM are normal. Pupils are equal, round, and reactive to light. Right eye exhibits no discharge. Left eye exhibits no discharge. No scleral icterus.  Neck: Normal range of motion. Neck supple. No JVD  present. No tracheal deviation present. No thyromegaly present.  Cardiovascular: Normal rate, regular rhythm, normal heart sounds and intact distal pulses.  Exam reveals no gallop and no friction rub.   No murmur heard. Respiratory: Effort normal and breath sounds normal. No stridor. No respiratory distress. She has no wheezes. She has no rales. She exhibits no tenderness.  GI: Soft. Bowel sounds are normal. She exhibits no distension and no mass. There is no tenderness. There is no rebound and no guarding.  Musculoskeletal: She exhibits no edema and no tenderness.  Lymphadenopathy:    She has no cervical adenopathy.  Neurological: She is alert and oriented to person, place, and time. She has normal reflexes. She displays normal reflexes. No cranial nerve deficit. She exhibits normal muscle tone. Coordination normal.  Skin: Skin is warm and dry. No rash noted. She is not diaphoretic. No erythema. No pallor.  Psychiatric: She has a normal mood and affect. Her behavior is normal. Judgment and thought content normal.  Ortho Eval: Positive right SLR 30 degrees, weak right EHL 2/5, weak right Foot DF 2/5. Positive right PCT. MRI 07/21/2012 with large HNP recurrent right L4-5. Patient is on OR gurney with right leg flexed at the knee and hip, severe recurring muscular spasm.  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  Plan: Or for redo right L4-5 microdiscectomy. Patient was seen and examined in the preop holding area. There has  been no interval  Change in this patient's exam preop  history and physical exam  Lab tests and images have been examined and reviewed.  The Risks benefits and alternative treatments have been discussed  extensively,questions answered.  The patient has elected to undergo the discussed surgical treatment.   NITKA,JAMES E 07/25/2012, 5:12 PM

## 2012-07-25 NOTE — Anesthesia Procedure Notes (Signed)
Procedure Name: Intubation Date/Time: 07/25/2012 7:12 PM Performed by: Molli Hazard Pre-anesthesia Checklist: Patient identified, Emergency Drugs available, Suction available and Patient being monitored Patient Re-evaluated:Patient Re-evaluated prior to inductionOxygen Delivery Method: Circle system utilized Preoxygenation: Pre-oxygenation with 100% oxygen Intubation Type: IV induction Ventilation: Mask ventilation without difficulty Laryngoscope Size: Miller and 2 Grade View: Grade I Tube type: Oral Tube size: 7.0 mm Number of attempts: 1 Airway Equipment and Method: Stylet Placement Confirmation: ETT inserted through vocal cords under direct vision,  positive ETCO2 and breath sounds checked- equal and bilateral Secured at: 23 cm Tube secured with: Tape Dental Injury: Teeth and Oropharynx as per pre-operative assessment

## 2012-07-25 NOTE — Preoperative (Signed)
Beta Blockers   Reason not to administer Beta Blockers:Not Applicable 

## 2012-07-25 NOTE — Progress Notes (Addendum)
Dr. Arta Bruce notified of patients ALT and AST. "okay we'll keep a watch on it".  Also informed Dr. Porshe Fleagle Piper that  her adrenal gland stopped functioning after her last surgery. "okay"

## 2012-07-25 NOTE — Anesthesia Preprocedure Evaluation (Addendum)
Anesthesia Evaluation  Patient identified by MRN, date of birth, ID band Patient awake    Reviewed: Allergy & Precautions, H&P , NPO status , Patient's Chart, lab work & pertinent test results  History of Anesthesia Complications (+) PONV  Airway Mallampati: I TM Distance: >3 FB Neck ROM: Full    Dental   Pulmonary          Cardiovascular     Neuro/Psych  Headaches, Depression    GI/Hepatic Neg liver ROS,   Endo/Other  History of adrenal insufficiency resolved as per patient   Renal/GU negative Renal ROS     Musculoskeletal   Abdominal   Peds  Hematology   Anesthesia Other Findings   Reproductive/Obstetrics                          Anesthesia Physical Anesthesia Plan  ASA: II  Anesthesia Plan: General   Post-op Pain Management:    Induction: Intravenous  Airway Management Planned: Oral ETT  Additional Equipment:   Intra-op Plan:   Post-operative Plan: Extubation in OR  Informed Consent: I have reviewed the patients History and Physical, chart, labs and discussed the procedure including the risks, benefits and alternatives for the proposed anesthesia with the patient or authorized representative who has indicated his/her understanding and acceptance.     Plan Discussed with: CRNA, Surgeon and Anesthesiologist  Anesthesia Plan Comments:        Anesthesia Quick Evaluation

## 2012-07-25 NOTE — Op Note (Addendum)
07/25/2012  7:15 PM  PATIENT:  [name] [name]  46 y.o. 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 PROCEDURE:  Procedure(s): MICRODISCECTOMY LUMBAR LAMINECTOMY Re-do Right  L4-5 Microdiscectomy.    SURGEON:  Kerrin Champagne, MD     ANESTHESIA:  General, supplemented with local Marcaine 1/2% with 1/200,000 epi Dr. Randa Evens    COMPLICATIONS:  None.   EBL: Less than 50cc.  DRAINS: Foley place to SD at the beginning of the case removed at the end of the case.    PROCEDURE:The patient was met in the holding area, and the appropriate Right Lumbar level L4-5 identified and marked with "x" and my initials.The patient was then transported to OR and was placed under general anesthesia without difficulty. The patient received appropriate preoperative antibiotic prophylaxis. The patient after intubation atraumatically was transferred to the operating room table, prone position, Wilson frame, sliding OR table. All pressure points were well padded. The arms in 90-90 well-padded at the elbows. Standard prep with DuraPrep solution lower dorsal spine to the mid sacral segment. Draped in the usual manner iodine Vi-Drape was used. Time-out procedure was called and correct. The skin incision scar at L4-5 marked and was  infiltrated with Marcaine half percent with 1-200,000 epinephrine total of 10 cc used. An incision approximately an inch inch and a half in length was then made through skin and subcutaneous layers ellipsing the old skin incision in line with the right side of the expected midline. An incision made into the right lumbosacral fascia approximately an inch in length along the right L4-5 spinous processes. The paraspinal muscles elevated from the right side of the spinous processes and lamina of L4 and L5 Boss McCollough  retractor then placed and a Penfield #4 placed at the inferior aspect of the right L3-4 facet a lateral radiograph identified the marker at the level of the L3-4 facet. The operating room microscope sterilely draped brought into the field. Under the operating room microscope, the L4-5 interspace carefully debrided the small amount of muscle attachment here and microcurettes used to define the previous laminotomy right L4-5. A high-speed bur used to drill the medial aspect of the inferior articular process of L4.  In order to medialize the laminotomy the right side of the L4 spinous process was thinned using a high speed bur. 2 mm Kerrison then used to enter the spinal canal over the medial  aspect of the L4 inferior articular process carefully using the Kerrison to debris the attachment as a curet. Foraminotomy was then performed over the L5 nerve root. The medial 10% superior articular process of L5  then resected using 2 mm Kerrison. This allowed for identification of the thecal sac. Penfield 4 was then used to carefully mobilize the thecal sac medially and the L5 nerve root identified within the lateral recess flattened over the posterior aspect of the herniated disc. Carefully the lateral aspect of the L5 nerve root was identified and a Penfield 4 was used to mobilize the nerve medially such that the herniated disc was visible with microscope. Using a Derricho  for retraction and a Nicholos Johns #4 was used to define the rent in the posterior longitudinal ligament within the lateral recess on the left side longitudinally. Disc material immediately extruded and this was removed using micropituitary rongeurs and nerve hook nerve root and then more easily  able to be mobilized medially and retracted using a Derricho retractor. Further foraminotomies was performed over the L4 nerve root the nerve root was noted to be exiting without further compression. The nerve root able to be retracted along the medial aspect of the L5  pedicle and disc material found to be subligamentous at this level was further resected current pituitary rongeurs. Ligamentum flavum was further debrided superiorly to the level L4-5 disc. Had a small amount of further resection of the L5 lamina inferiorly was performed. With this then the disc space at L4-5 was easily visualized and entry into the disc at the sided disc herniation was possible using a Penfield 4 intraoperative Lateral radiograph was used to identify the L4-5 disc with the Penfield 4 In place at the L4-5 disc space.  Micropituitary was used to further debride this material superficially from the posterior aspect of the intervertebral disc is posterior lateral aspect of the disc. Small amount of further disc material was found subligamentous extending inferiorly from the disc this was removed using micropituitary rongeurs. Ligamentum flavum was debrided and lateral recess along the medial aspect L4-5 facet no further decompression was necessary. Ball tip nerve probe was then able to carefully palpate the neuroforamen for L4 and L5 finding these to be well decompressed. Bleeding was then controlled using thrombin-soaked Gelfoam small cottonoids.  Small amount of bleeding within the soft tissue mass the laminotomy area was controlled using bipolar electrocautery. Irrigation was carried out using copious amounts of irrigant solution. All Gelfoam  were then removed. No significant active bleeding present at the time of removal. All instruments sponge counts were correct traction system was then carefully removed. Lumbodorsal fascia was then carefully approximated with interrupted 0 Vicryl sutures with a UR 6 needle, the deep subcutaneous layers were approximated with interrupted 0 Vicryl sutures on UR 6 the appear subcutaneous layers approximated with interrupted 2-0 Vicryl sutures and the skin closed with a running subcutaneous stitch of 4-0 Vicryl. Dermabond was applied allowed to dry and then  Mepilex bandage applied. Patient was then carefully returned to supine position on a stretcher, reactivated and extubated. She was then returned to recovery room in satisfactory condition.  A CRNFA perform the duties of Designer, television/film set during this case. He was present from the beginning of the case to the end of the case assisting in transfer the patient from his stretcher to the OR table and back to the stretcher at the end of the case. Assisted in careful retraction and suction of the laminectomy site delicate neural structures operating under the operating room microscope. He also assisted with closure of the incision from the fascia to the skin applying the dressing.     Caylin Raby E 07/25/2012, 7:15 PM

## 2012-07-25 NOTE — Progress Notes (Signed)
1834  nEW ORDER FOR CLINDAMYCIN 600MG  BEING WRITTEN BY PHARMACIST..(I AM UNABLE TO GET INTO THE CHART AT THIS TIME)....Marland KitchenDA

## 2012-07-25 NOTE — Anesthesia Postprocedure Evaluation (Signed)
  Anesthesia Post-op Note  Patient: [name] [name]  Procedure(s) Performed: Procedure(s) (LRB) with comments: MICRODISCECTOMY LUMBAR LAMINECTOMY (N/A) - Re-do Right L4-5 Microdiscectomy  Patient Location: PACU  Anesthesia Type:General  Level of Consciousness: awake  Airway and Oxygen Therapy: Patient Spontanous Breathing  Post-op Pain: mild  Post-op Assessment: Post-op Vital signs reviewed  Post-op Vital Signs: Reviewed  Complications: No apparent anesthesia complications

## 2012-07-25 NOTE — Transfer of Care (Signed)
Immediate Anesthesia Transfer of Care Note  Patient: [name] [name]  Procedure(s) Performed: Procedure(s) (LRB) with comments: MICRODISCECTOMY LUMBAR LAMINECTOMY (N/A) - Re-do Right L4-5 Microdiscectomy  Patient Location: PACU  Anesthesia Type:General  Level of Consciousness: awake, oriented and sedated  Airway & Oxygen Therapy: Patient connected to nasal cannula oxygen  Post-op Assessment: Report given to PACU RN, Post -op Vital signs reviewed and stable and Patient moving all extremities X 4  Post vital signs: Reviewed and stable  Complications: No apparent anesthesia complications

## 2012-07-26 ENCOUNTER — Encounter (HOSPITAL_COMMUNITY): Payer: Self-pay | Admitting: Specialist

## 2012-07-26 MED ORDER — CYCLOBENZAPRINE HCL 10 MG PO TABS: 10.0000 mg | ORAL_TABLET | Freq: Three times a day (TID) | ORAL | Status: DC | PRN

## 2012-07-26 MED ORDER — OXYCODONE-ACETAMINOPHEN 5-325 MG PO TABS: 1.0000 | ORAL_TABLET | ORAL | Status: DC | PRN

## 2012-07-26 MED ORDER — PANTOPRAZOLE SODIUM 40 MG PO TBEC
40.0000 mg | DELAYED_RELEASE_TABLET | Freq: Every day | ORAL | Status: DC
  Administered 2012-07-26: 40 mg via ORAL
  Filled 2012-07-26: qty 1

## 2012-07-26 MED ORDER — KETOROLAC TROMETHAMINE 30 MG/ML IJ SOLN
30.0000 mg | Freq: Three times a day (TID) | INTRAMUSCULAR | Status: AC
  Administered 2012-07-26 – 2012-07-27 (×3): 30 mg via INTRAVENOUS
  Filled 2012-07-26 (×2): qty 1

## 2012-07-26 MED ORDER — OXYCODONE HCL ER 20 MG PO T12A: 20.0000 mg | EXTENDED_RELEASE_TABLET | Freq: Two times a day (BID) | ORAL | Status: DC

## 2012-07-26 MED ORDER — KETOROLAC TROMETHAMINE 30 MG/ML IJ SOLN
INTRAMUSCULAR | Status: AC
  Filled 2012-07-26: qty 1

## 2012-07-26 NOTE — Progress Notes (Signed)
Patient examined and lab reviewed with Vernon PA-C. 

## 2012-07-26 NOTE — Progress Notes (Signed)
Referral received for SNF. Chart reviewed and CSW has spoken with RNCM who indicates that patient is for DC to home with no HH/DME needs indicated per OT evaluation. OK for d/c today per MD.   CSW to sign off. Please re-consult if CSW needs arise.  Lorri Frederick. [place]ugh  [phone]

## 2012-07-26 NOTE — Plan of Care (Signed)
Problem: Phase I Progression Outcomes Goal: Initial discharge plan identified Outcome: Completed/Met Date Met:  07/26/12 Plans to d/c home

## 2012-07-26 NOTE — Evaluation (Signed)
Physical Therapy Evaluation Patient Details Name: [name] [name] MRN: [id number] DOB: Feb 22, 1966 Today's Date: 07/26/2012 Time: 1030-1056 PT Time Calculation (min): 26 min  PT Assessment / Plan / Recommendation Clinical Impression  Pt is 46 y/o female admitted for s/p s/p microdiscectomy lumbar laminectomy revision.  Pt very limited on evaluation due to overall fatigue.  Pt has been up several times to restroom but unable to ambulate further than 15 feet. Pt with increased right LE tremors and numbness during PT evaluation.  Pt request to return to bed for comfort and due to overal fatigue.  Pt very motivated but will need further PT prior to d/c home.  Pt will benefit from acute PT services to improve overall mobility, endurance, strength and prepare for safe d/c home.    PT Assessment  Patient needs continued PT services    Follow Up Recommendations  Home health PT;Supervision/Assistance - 24 hour    Does the patient have the potential to tolerate intense rehabilitation      Barriers to Discharge None      Equipment Recommendations  None recommended by OT;None recommended by PT (pt already has DME)    Recommendations for Other Services     Frequency Min 5X/week    Precautions / Restrictions Precautions Precautions: Back Precaution Booklet Issued: Yes (comment) Precaution Comments: gave handout, able to recall 3/3  Restrictions Weight Bearing Restrictions: No   Pertinent Vitals/Pain 7/10 back pain      Mobility  Bed Mobility Bed Mobility: Left Sidelying to Sit;Sitting - Scoot to Edge of Bed;Sit to Sidelying Left;Rolling Right Rolling Right: 6: Modified independent (Device/Increase time);With rail Right Sidelying to Sit: 4: Min guard Sit to Sidelying Right: 4: Min assist;With rail Details for Bed Mobility Assistance: Minguard for safety and needs extra time to complete task.  (A) with LE into bed. Transfers Transfers: Sit to Stand;Stand to Sit Sit to Stand: With  upper extremity assist;From chair/3-in-1;From bed;4: Min assist Stand to Sit: With upper extremity assist;To chair/3-in-1;4: Min assist;To bed Details for Transfer Assistance: (A) to initiate transfer and cues for hand placement.  Pt needs extra time to complete task due to pain Ambulation/Gait Ambulation/Gait Assistance: 4: Min assist;3: Mod assist Ambulation Distance (Feet): 15 Feet Assistive device: Rolling walker Ambulation/Gait Assistance Details: min (A) to manage RW with occasional mod (A) to maintain balance.  Pt tends to lean posterior and sways with right LE tremors.  Pt c/o decrease sensation on right LE limiting ambulation. Gait Pattern: Step-to pattern;Decreased stride length;Shuffle;Decreased trunk rotation;Wide base of support    Shoulder Instructions     Exercises     PT Diagnosis: Difficulty walking;Abnormality of gait;Generalized weakness;Acute pain  PT Problem List: Decreased strength;Decreased activity tolerance;Decreased balance;Decreased mobility;Decreased knowledge of use of DME;Pain PT Treatment Interventions: DME instruction;Gait training;Stair training;Functional mobility training;Therapeutic activities;Therapeutic exercise;Balance training;Cognitive remediation;Patient/family education   PT Goals Acute Rehab PT Goals PT Goal Formulation: With patient/family Time For Goal Achievement: 08/02/12 Potential to Achieve Goals: Good Pt will Roll Supine to Right Side: with modified independence PT Goal: Rolling Supine to Right Side - Progress: Goal set today Pt will go Supine/Side to Sit: with modified independence PT Goal: Supine/Side to Sit - Progress: Goal set today Pt will go Sit to Supine/Side: with modified independence PT Goal: Sit to Supine/Side - Progress: Goal set today Pt will go Sit to Stand: with modified independence PT Goal: Sit to Stand - Progress: Goal set today Pt will go Stand to Sit: with modified independence PT Goal: Stand to  Sit - Progress:  Goal set today Pt will Ambulate: >150 feet;with modified independence;with rolling walker PT Goal: Ambulate - Progress: Goal set today Pt will Go Up / Down Stairs: 3-5 stairs;with min assist;with least restrictive assistive device PT Goal: Up/Down Stairs - Progress: Goal set today  Visit Information  Last PT Received On: 07/26/12 Assistance Needed: +1    Subjective Data  Subjective: "I'm really tired   Prior Functioning  Home Living Lives With: Spouse;Daughter Available Help at Discharge: Family Type of Home: House Home Access: Stairs to enter Secretary/administrator of Steps: 3 Entrance Stairs-Rails: None Home Layout: Two level;Able to live on main level with bedroom/bathroom Alternate Level Stairs-Number of Steps: 12 Alternate Level Stairs-Rails: Left Bathroom Shower/Tub: Health visitor: Standard (has 3n1 to ) Bathroom Accessibility: Yes How Accessible: Accessible via walker Home Adaptive Equipment: Bedside commode/3-in-1;Reacher;Sock aid;Long-handled sponge;Long-handled shoehorn;Hand-held shower hose;Quad cane;Walker - rolling;Straight cane Prior Function Level of Independence: Independent Able to Take Stairs?: Yes Driving: Yes Vocation: Full time employment Communication Communication: No difficulties Dominant Hand: Right    Cognition  Overall Cognitive Status: Appears within functional limits for tasks assessed/performed Arousal/Alertness: Awake/alert Orientation Level: Oriented X4 / Intact Behavior During Session: [facility] for tasks performed    Extremity/Trunk Assessment Right Upper Extremity Assessment RUE ROM/Strength/Tone: Within functional levels Left Upper Extremity Assessment LUE ROM/Strength/Tone: Within functional levels Right Lower Extremity Assessment RLE ROM/Strength/Tone: Unable to fully assess;Due to pain;Deficits RLE ROM/Strength/Tone Deficits: Limited due to pain however noticeable right knee buckling and tremors during mobility. RLE  Sensation: Deficits RLE Sensation Deficits: numbness radiating down right LE Left Lower Extremity Assessment LLE ROM/Strength/Tone: Within functional levels Trunk Assessment Trunk Assessment: Normal   Balance Balance Balance Assessed: Yes Static Standing Balance Static Standing - Balance Support: Bilateral upper extremity supported Static Standing - Level of Assistance: 3: Mod assist Static Standing - Comment/# of Minutes: (A) to maintain balance and prevent posterior lean and sway during standing.    End of Session PT - End of Session Equipment Utilized During Treatment: Gait belt Activity Tolerance: Patient limited by fatigue Patient left: in bed;with call bell/phone within reach Nurse Communication: Mobility status  GP     Ellwyn Ergle 07/26/2012, 12:56 PM Jake Shark, PT DPT [phone]

## 2012-07-26 NOTE — Progress Notes (Signed)
Physical Therapy Treatment Patient Details Name: [name] [name] MRN: [id number] DOB: Oct 29, 1965 Today's Date: 07/26/2012 Time: 4098-1191 PT Time Calculation (min): 27 min  PT Assessment / Plan / Recommendation Comments on Treatment Session  Pt continues to c/o fatigue with right LE numbness.  Reevaluated LE strength and pt with left LE 3+/5 hip flexion, 3+/5 left LE knee extension; Right LE unable to further assess due to pain however Right DF 1/5 left DF 4/5.  Pt may benefit from right AFO.  Pt continues to have balance deficits and unable to ambulate or perform ADLs without assistance.  Pt continues to be very weak and discussed with RN.  Pt not safe to d/c home at this time.  Will need to practice steps prior to d/c home and further mobility.  Pt will definitely need 24 hour assist at d/c due to high risk of falls.  Pt will need caregiver to ambulate pt.      Follow Up Recommendations  Home health PT;Supervision/Assistance - 24 hour     Does the patient have the potential to tolerate intense rehabilitation     Barriers to Discharge None      Equipment Recommendations  None recommended by OT;None recommended by PT    Recommendations for Other Services    Frequency Min 5X/week   Plan Discharge plan remains appropriate;Frequency remains appropriate    Precautions / Restrictions Precautions Precautions: Back Precaution Booklet Issued: Yes (comment) Precaution Comments: gave handout, able to recall 3/3  Restrictions Weight Bearing Restrictions: No   Pertinent Vitals/Pain 6/10 back pain with right radiating pain and right foot numbness    Mobility  Bed Mobility Bed Mobility: Left Sidelying to Sit;Sitting - Scoot to Edge of Bed;Sit to Sidelying Left;Rolling Right Rolling Right: 6: Modified independent (Device/Increase time);With rail Right Sidelying to Sit: 4: Min guard Sit to Sidelying Right: 4: Min assist;With rail Sit to Sidelying Left: 4: Min assist;With rail Details for  Bed Mobility Assistance: (A) with LE into bed with cues for proper technique Transfers Transfers: Sit to Stand;Stand to Sit Sit to Stand: With upper extremity assist;From chair/3-in-1;From bed;4: Min assist Stand to Sit: 4: Min assist;To bed Details for Transfer Assistance: (A) to slowly descend to bed with max cues for hand placement Ambulation/Gait Ambulation/Gait Assistance: 4: Min assist;3: Mod assist Ambulation Distance (Feet): 15 Feet Assistive device: Rolling walker Ambulation/Gait Assistance Details: (A) to maintain balance.  Pt with occasional mod (A) to maintain balance with noticeable right knee buckling and shuffle gait.  Pt continues to have posterior sway with ambulation Gait Pattern: Step-to pattern;Decreased stride length;Shuffle;Decreased trunk rotation;Wide base of support Stairs: No    Exercises     PT Diagnosis: Difficulty walking;Abnormality of gait;Generalized weakness;Acute pain  PT Problem List: Decreased strength;Decreased activity tolerance;Decreased balance;Decreased mobility;Decreased knowledge of use of DME;Pain PT Treatment Interventions: DME instruction;Gait training;Stair training;Functional mobility training;Therapeutic activities;Therapeutic exercise;Balance training;Cognitive remediation;Patient/family education   PT Goals Acute Rehab PT Goals PT Goal Formulation: With patient/family Time For Goal Achievement: 08/02/12 Potential to Achieve Goals: Good Pt will Roll Supine to Right Side: with modified independence PT Goal: Rolling Supine to Right Side - Progress: Progressing toward goal Pt will go Supine/Side to Sit: with modified independence PT Goal: Supine/Side to Sit - Progress: Progressing toward goal Pt will go Sit to Supine/Side: with modified independence PT Goal: Sit to Supine/Side - Progress: Progressing toward goal Pt will go Sit to Stand: with modified independence PT Goal: Sit to Stand - Progress: Progressing toward goal Pt will go  Stand  to Sit: with modified independence PT Goal: Stand to Sit - Progress: Progressing toward goal Pt will Ambulate: >150 feet;with modified independence;with rolling walker PT Goal: Ambulate - Progress: Progressing toward goal Pt will Go Up / Down Stairs: 3-5 stairs;with min assist;with least restrictive assistive device PT Goal: Up/Down Stairs - Progress: Goal set today  Visit Information  Last PT Received On: 07/26/12 Assistance Needed: +1    Subjective Data  Subjective: "I'm really tired   Cognition  Overall Cognitive Status: Appears within functional limits for tasks assessed/performed Arousal/Alertness: Awake/alert Orientation Level: Oriented X4 / Intact Behavior During Session: [facility] for tasks performed    Balance  Balance Balance Assessed: Yes Static Standing Balance Static Standing - Balance Support: Bilateral upper extremity supported Static Standing - Level of Assistance: 3: Mod assist Static Standing - Comment/# of Minutes: (A) to maintain balance and prevent posterior lean and sway during standing.    End of Session PT - End of Session Equipment Utilized During Treatment: Gait belt Activity Tolerance: Patient limited by fatigue Patient left: in bed;with call bell/phone within reach Nurse Communication: Mobility status   GP     Jassiah Viviano 07/26/2012, 3:27 PM Jake Shark, PT DPT [phone]

## 2012-07-26 NOTE — Progress Notes (Signed)
CARE MANAGEMENT NOTE 07/26/2012  Patient:  [name] [name], [name] [name]   Account Number:  [id number]  Date Initiated:  07/26/2012  Documentation initiated by:  Vance Peper  Subjective/Objective Assessment:   46 yr old female s/p female s/p L4-L5 microdiskectomy.     Action/Plan:   CM spoke with patient concerning home health needs. Choice offered.She has used [facility], wants to do so now. Contacted [employer], [facility] liasion.   Anticipated DC Date:  07/27/2012   Anticipated DC Plan:  HOME W HOME HEALTH SERVICES      DC Planning Services  CM consult      [facility] Choice  HOME HEALTH   Choice offered to / List presented to:  C-1 Patient        HH arranged  HH-2 PT      [facility] agency  [facility]   Status of service:  Completed, signed off Medicare Important Message given?   (If response is "NO", the following Medicare IM given date fields will be blank) Date Medicare IM given:   Date Additional Medicare IM given:    Discharge Disposition:  HOME W HOME HEALTH SERVICES  Per UR Regulation:    If discussed at Long Length of Stay Meetings, dates discussed:    Comments:

## 2012-07-26 NOTE — Progress Notes (Signed)
UR COMPLETED  

## 2012-07-26 NOTE — Progress Notes (Signed)
Subjective: 1 Day Post-Op Procedure(s) (LRB): MICRODISCECTOMY LUMBAR LAMINECTOMY (N/A) Patient reports pain as moderate.   Leg pain considerably improved.  Still feels weak and leg felt "shakey" when trying to ambulate to BR earlier.  No walking yet. Objective: Vital signs in last 24 hours: Temp:  [97.2 F (36.2 C)-99 F (37.2 C)] 98 F (36.7 C) (10/31 0640) Pulse Rate:  [83-102] 95  (10/31 0640) Resp:  [6-18] 18  (10/31 0640) BP: (108-128)/(68-80) 108/80 mmHg (10/31 0640) SpO2:  [96 %-100 %] 96 % (10/31 0640) Weight:  [85.4 kg (188 lb 4.4 oz)] 85.4 kg (188 lb 4.4 oz) (10/30 2330)  Intake/Output from previous day: 10/30 0701 - 10/31 0700 In: 2580 [P.O.:480; I.V.:2100] Out: 1000 [Urine:925; Blood:75] Intake/Output this shift:     Basename 07/24/12 1223  HGB 13.6    Basename 07/24/12 1223  WBC 7.8  RBC 4.31  HCT 40.4  PLT 247    Basename 07/24/12 1223  NA 135  K 3.6  CL 100  CO2 24  BUN 12  CREATININE 0.69  GLUCOSE 89  CALCIUM 9.4   No results found for this basename: LABPT:2,INR:2 in the last 72 hours  Incision: dressing C/D/I Left ankle DF 4/5, left quad 5-/5, left EHL 5-/5 Assessment/Plan: 1 Day Post-Op Procedure(s) (LRB): MICRODISCECTOMY LUMBAR LAMINECTOMY (N/A) Up with therapy today Discharge home this afternoon. OV 2 weeks  Sherrod Toothman M 07/26/2012, 8:08 AM

## 2012-07-26 NOTE — Evaluation (Signed)
Occupational Therapy Evaluation Patient Details Name: [name] [name] MRN: [id number] DOB: 06/01/1966 Today's Date: 07/26/2012 Time: 4098-1191 OT Time Calculation (min): 31 min  OT Assessment / Plan / Recommendation Clinical Impression  Pt. 46 yo female s/p microdiscectomy lumbar laminectomy revision. Pt well educated on back precautions, AE for LB bathing/dressing. OT to follow acutely.     OT Assessment  Patient needs continued OT Services    Follow Up Recommendations  Supervision - Intermittent    Barriers to Discharge      Equipment Recommendations  None recommended by OT (pt already has DME)    Recommendations for Other Services    Frequency  Min 2X/week    Precautions / Restrictions Precautions Precautions: Back Precaution Booklet Issued: Yes (comment) Precaution Comments: gave handout, able to recall 3/3  Restrictions Weight Bearing Restrictions: No   Pertinent Vitals/Pain 3/10    ADL  Eating/Feeding: Performed;Set up Where Assessed - Eating/Feeding: Bed level Grooming: Performed;Wash/dry hands;Min guard Where Assessed - Grooming: Unsupported standing Toilet Transfer: Performed;Minimal assistance Toilet Transfer Method: Sit to Barista: Raised toilet seat with arms (or 3-in-1 over toilet) Toileting - Clothing Manipulation and Hygiene: Performed;Independent Where Assessed - Toileting Clothing Manipulation and Hygiene: Standing Transfers/Ambulation Related to ADLs: Min guard during transfers and ambulation. Pt. ambulates with increased time due to RLE shaking while taking steps.   ADL Comments: Reviewed back precautions with pt and gave handout. Pt able to perform bed mobility and log roll at mod independence level. Pt has already bought hip kit from previous back surgery and uses it at home to (A) with LB bathing and dressing. Ambulated to bathroom with min (A) due to RLE shaking while taking steps. Required vc's for hand placement, safety  and sequencing. Performed toilet transfer and washed hands at min guard.    OT Diagnosis: Generalized weakness;Acute pain  OT Problem List: Decreased strength;Decreased activity tolerance;Decreased knowledge of use of DME or AE;Decreased safety awareness;Pain OT Treatment Interventions: Self-care/ADL training;Patient/family education;Therapeutic activities   OT Goals Acute Rehab OT Goals OT Goal Formulation: With patient Time For Goal Achievement: 08/09/12 Potential to Achieve Goals: Good ADL Goals Pt Will Perform Lower Body Bathing: with modified independence;Sit to stand from chair;with adaptive equipment ADL Goal: Lower Body Bathing - Progress: Goal set today Pt Will Perform Lower Body Dressing: with modified independence;Sit to stand from chair;with adaptive equipment ADL Goal: Lower Body Dressing - Progress: Goal set today Miscellaneous OT Goals Miscellaneous OT Goal #1: Pt will adhere to back precautions throughout ADL session 100% prior to d/c OT Goal: Miscellaneous Goal #1 - Progress: Goal set today  Visit Information  Last OT Received On: 07/26/12 Assistance Needed: +1    Subjective Data  Subjective: The surgery has taken so much of the pain away already  Patient Stated Goal: To go home    Prior Functioning     Home Living Lives With: Spouse;Daughter Available Help at Discharge: Family Type of Home: House Home Access: Stairs to enter [employer] of Steps: 3 Entrance Stairs-Rails: None Home Layout: Two level;Able to live on main level with bedroom/bathroom Alternate Level Stairs-Number of Steps: 12 Alternate Level Stairs-Rails: Left Bathroom Shower/Tub: Health visitor: Standard (has 3n1 to ) Bathroom Accessibility: Yes How Accessible: Accessible via walker Home Adaptive Equipment: Bedside commode/3-in-1;Reacher;Sock aid;Long-handled sponge;Long-handled shoehorn;Hand-held shower hose;Quad cane;Walker - rolling;Straight cane Prior  Function Level of Independence: Independent Able to Take Stairs?: Yes Driving: Yes Vocation: Full time employment Communication Communication: No difficulties Dominant Hand: Right  Vision/Perception     Cognition  Overall Cognitive Status: Appears within functional limits for tasks assessed/performed Arousal/Alertness: Awake/alert Orientation Level: Oriented X4 / Intact Behavior During Session: [facility] for tasks performed    Extremity/Trunk Assessment Right Upper Extremity Assessment RUE ROM/Strength/Tone: Within functional levels Left Upper Extremity Assessment LUE ROM/Strength/Tone: Within functional levels Trunk Assessment Trunk Assessment: Normal     Mobility Bed Mobility Bed Mobility: Rolling Left;Left Sidelying to Sit;Sitting - Scoot to [employer] of Bed;Sit to Sidelying Left Rolling Left: 6: Modified independent (Device/Increase time) Left Sidelying to Sit: 6: Modified independent (Device/Increase time) Sitting - Scoot to Edge of Bed: 6: Modified independent (Device/Increase time) Sit to Sidelying Left: 6: Modified independent (Device/Increase time);HOB flat Details for Bed Mobility Assistance: Pt. already educated on log roll and able to demonstrate at mod independence level.  Transfers Transfers: Sit to Stand;Stand to Sit Sit to Stand: With upper extremity assist;From chair/3-in-1;From bed;4: Min guard Stand to Sit: With upper extremity assist;To chair/3-in-1;4: Min guard Details for Transfer Assistance: min guard for safety                     End of Session OT - End of Session Equipment Utilized During Treatment: Gait belt Activity Tolerance: Patient tolerated treatment well Patient left: in bed;with call bell/phone within reach Nurse Communication: Mobility status  GO     Cleora Fleet 07/26/2012, 8:58 AM

## 2012-07-26 NOTE — Evaluation (Signed)
I agree with the following treatment note after reviewing documentation.   Johnston, Ma Munoz Brynn   OTR/L Pager: [phone] Office: [phone] .   

## 2012-07-26 NOTE — Progress Notes (Signed)
Patient ID: [name] [name], female   DOB: [birth year], 46 y.o.   MRN: [id number] Received call from nursing, Mrs. [name] [name] did poorly in PT with difficulty with balance and weight bearing on the right leg. Physical therapy not recommending discharge without Further therapy. Will cancel discharge for today and plan to reschedule for tomorrow if she does well in therapy.

## 2012-07-27 NOTE — Progress Notes (Signed)
Subjective: 2 Days Post-Op Procedure(s) (LRB): MICRODISCECTOMY LUMBAR LAMINECTOMY (N/A) Discomfort today is better today more tolerable. Persistent numbness right dorsal lateral foot with persistent dorsi flexion weakness in the right as she had preoperatively. Patient reports pain as moderate.    Objective:   VITALS:  Temp:  [98.1 F (36.7 C)-98.7 F (37.1 C)] 98.1 F (36.7 C) (11/01 0531) Pulse Rate:  [80-108] 87  (11/01 0531) Resp:  [16-18] 18  (11/01 0531) BP: (91-106)/(48-70) 106/58 mmHg (11/01 0545) SpO2:  [97 %-98 %] 98 % (11/01 0531)  ABD soft Intact pulses distally Incision: no drainage No cellulitis present Compartment soft Right foot dorsi flexion 2/5, right EHL 2/5. Straight leg raise minimally uncomfortable on the right. Dressing changed incision is dry with minimal warmth no erythremia. No fluctuance noted.  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  Up with therapy Discharge home with home health Obtained AFO and plan on discharge this morning following therapy.Marland Kitchen [name] [name] E 07/27/2012, 7:56 AM

## 2012-07-27 NOTE — Progress Notes (Signed)
Reviewed d/c instructions with patient and spouse.  Rx x 3 given.  Wheelchair delivered to room by [facility].  All questions answered.  Pt says she has all other needed equipment at home.  HHPT set up.  Pt d/c via wheelchair in stable condition.

## 2012-07-27 NOTE — Progress Notes (Addendum)
Physical Therapy Treatment Patient Details Name: [name] [name] MRN: [id number] DOB: 1966/04/18 Today's Date: 07/27/2012 Time: 4098-1191 PT Time Calculation (min): 34 min  PT Assessment / Plan / Recommendation Comments on Treatment Session  Pt continues to have bilateral LE tremors and right knee buckling limiting overall ambulation.  Pt currently needs mod (A) to maintain balance and prevent falls during ambulation.  Pt unable to perform stairs and will need wheelchair to get up/down stairs and to manage in household.  Called [employer] to determine apprioriate AFO brace.  Representative from [employer] for AFO to determine brace with knee buckling with occasaionl recurvatum.  Continue to recommend HHPT and highly recommend wheelchair for home mobility and stairs.  Stair handout given with use of wheelchair.      Follow Up Recommendations  Home health PT;Supervision/Assistance - 24 hour     Does the patient have the potential to tolerate intense rehabilitation     Barriers to Discharge  None      Equipment Recommendations  Wheelchair    Recommendations for Other Services    Frequency Min 5X/week   Plan Discharge plan remains appropriate;Frequency remains appropriate    Precautions / Restrictions Precautions Precautions: Back (Simultaneous filing. User may not have seen previous data.) Precaution Booklet Issued: Yes (comment) Precaution Comments: Pt able to recall 3/3 back precautions Restrictions Weight Bearing Restrictions: No   Pertinent Vitals/Pain 3/10 back pain    Mobility  Bed Mobility Rolling Right: 6: Modified independent (Device/Increase time);With rail Right Sidelying to Sit: 5: Supervision Details for Bed Mobility Assistance: Supervision for safety Transfers Transfers: Sit to Stand;Stand to Sit Sit to Stand: 4: Min assist;From bed;From chair/3-in-1;With armrests Stand to Sit: 4: Min assist;To chair/3-in-1 Details for Transfer Assistance: (A) to initiate transfer  and slowly descend with cues for hand placement Ambulation/Gait Ambulation/Gait Assistance: 4: Min assist;3: Mod assist Ambulation Distance (Feet): 20 Feet (x2) Assistive device: Rolling walker Ambulation/Gait Assistance Details: (A) to maintain balance.  Pt continues to have poor balance with posterior sway and ataxic gait. Gait Pattern: Step-to pattern;Decreased stride length;Right foot flat;Shuffle;Ataxic;Wide base of support Stairs: No (Pt with right knee buckling and unsafe to complete stairs.  )    Exercises     PT Diagnosis:    PT Problem List:   PT Treatment Interventions:     PT Goals Acute Rehab PT Goals PT Goal Formulation: With patient/family Time For Goal Achievement: 08/02/12 Potential to Achieve Goals: Good Pt will Roll Supine to Right Side: with modified independence PT Goal: Rolling Supine to Right Side - Progress: Progressing toward goal Pt will go Supine/Side to Sit: with modified independence PT Goal: Supine/Side to Sit - Progress: Progressing toward goal Pt will go Sit to Supine/Side: with modified independence PT Goal: Sit to Supine/Side - Progress: Progressing toward goal Pt will go Sit to Stand: with modified independence PT Goal: Sit to Stand - Progress: Progressing toward goal Pt will go Stand to Sit: with modified independence PT Goal: Stand to Sit - Progress: Progressing toward goal Pt will Ambulate: >150 feet;with modified independence;with rolling walker PT Goal: Ambulate - Progress: Progressing toward goal  Visit Information  Last PT Received On: 07/27/12 Assistance Needed: +1 (Simultaneous filing. User may not have seen previous data.)    Subjective Data  Subjective: "I'm still tired but I"m ready to try."   Cognition  Overall Cognitive Status: Appears within functional limits for tasks assessed/performed Arousal/Alertness: Awake/alert Orientation Level: Oriented X4 / Intact Behavior During Session: [facility] for tasks performed  Balance   Balance Balance Assessed: Yes Static Standing Balance Static Standing - Balance Support: Bilateral upper extremity supported Static Standing - Level of Assistance: 3: Mod assist Static Standing - Comment/# of Minutes: ~2 minutes and (A) to maintain balance  End of Session PT - End of Session Equipment Utilized During Treatment: Gait belt Activity Tolerance: Patient limited by fatigue Patient left: in chair;with call bell/phone within reach (with OT) Nurse Communication: Mobility status   GP     [name] [name] 07/27/2012, 9:09 AM Jake Shark, PT DPT [phone]

## 2012-07-27 NOTE — Progress Notes (Signed)
Occupational Therapy Treatment Patient Details Name: [name] [name] MRN: [id number] DOB: Jun 17, 1966 Today's Date: 07/27/2012 Time: 0829-0909 OT Time Calculation (min): 40 min  OT Assessment / Plan / Recommendation Comments on Treatment Session Pt still with tremors in RLE requiring facilitation at the hips and right knee. Performed LB dressing with min guard and use of AE. Able to perform toilet transfer with min (A) and hand washing at sink level with min guard.      Follow Up Recommendations  Home health OT       Equipment Recommendations  None recommended by OT;None recommended by PT       Frequency Min 2X/week   Plan Discharge plan remains appropriate    Precautions / Restrictions Precautions Precautions: Back Precaution Booklet Issued: Yes (comment) Precaution Comments: Pt able to recall 3/3 back precautions Restrictions Weight Bearing Restrictions: No   Pertinent Vitals/Pain Pain level 3/10 at end of session in back    ADL  Grooming: Performed;Wash/dry hands;Min guard Where Assessed - Grooming: Unsupported standing Lower Body Bathing: Simulated;Min guard Where Assessed - Lower Body Bathing: Supported sit to stand Lower Body Dressing: Performed;Min guard Where Assessed - Lower Body Dressing: Supported sit to Pharmacist, hospital: Performed;Minimal assistance with AE Toilet Transfer Method: Sit to Barista: Raised toilet seat with arms (or 3-in-1 over toilet) Toileting - Clothing Manipulation and Hygiene: Performed;Supervision/safety Where Assessed - Toileting Clothing Manipulation and Hygiene: Standing Transfers/Ambulation Related to ADLs: min guard during transfers. Min (A) during ambulation due to RLE tremors.      OT Goals Acute Rehab OT Goals OT Goal Formulation: With patient Time For Goal Achievement: 08/09/12 Potential to Achieve Goals: Good ADL Goals Pt Will Perform Lower Body Bathing: with modified independence;Sit to stand  from chair;with adaptive equipment ADL Goal: Lower Body Bathing - Progress: Progressing toward goals Pt Will Perform Lower Body Dressing: with modified independence;Sit to stand from chair;with adaptive equipment ADL Goal: Lower Body Dressing - Progress: Progressing toward goals Miscellaneous OT Goals Miscellaneous OT Goal #1: Pt will adhere to back precautions throughout ADL session 100% prior to d/c OT Goal: Miscellaneous Goal #1 - Progress: Met  Visit Information  Last OT Received On: 07/27/12 Assistance Needed: +1 PT/OT Co-Evaluation/Treatment: Yes (partial)          Cognition  Overall Cognitive Status: Appears within functional limits for tasks assessed/performed Arousal/Alertness: Awake/alert Orientation Level: Oriented X4 / Intact Behavior During Session: [facility] for tasks performed    Mobility  Shoulder Instructions Bed Mobility Bed Mobility: Left Sidelying to Sit;Sitting - Scoot to Edge of Bed;Sit to Sidelying Left;Rolling Right Rolling Right: 6: Modified independent (Device/Increase time);With rail Right Sidelying to Sit: 5: Supervision Details for Bed Mobility Assistance: Supervision for safety Transfers Transfers: Sit to Stand;Stand to Sit Sit to Stand: 4: Min assist;From bed;From chair/3-in-1;With armrests Stand to Sit: 4: Min assist;To chair/3-in-1 Details for Transfer Assistance: (A) with initiating and for safe hand placement             Balance Balance Balance Assessed: Yes Static Standing Balance Static Standing - Balance Support: Bilateral upper extremity supported Static Standing - Level of Assistance: 3: Mod assist Static Standing - Comment/# of Minutes: ~2 minutes and (A) to maintain balance   End of Session OT - End of Session Equipment Utilized During Treatment: Gait belt Activity Tolerance: Patient tolerated treatment well Patient left: in bed;with call bell/phone within reach Nurse Communication: Mobility status   I agree with the treatment  note above.  [facility]  Darcel Bayley, OTR/L [phone] 07/27/2012         Cleora Fleet 07/27/2012, 10:08 AM

## 2012-07-27 NOTE — Discharge Summary (Signed)
Physician Discharge Summary  Patient ID: MAKINLEE DUNWORTH MRN: [id number] DOB/AGE: January 04, 1966 46 y.o.  Admit date: 07/25/2012 Discharge date:07/27/2012   Admission Diagnoses:  Principal Problem:  *Herniated nucleus pulposus, lumbar   Discharge Diagnoses:  Same  Past Medical History  Diagnosis Date  . Hypertension     BP med. was d/c'd in 02/2012- post op  . Adrenal gland hypofunction     related to post op, vancomycin & lumbar injection, steroids    . Depression   . Cancer     "early evolvng melanoma "- legs   . Arthritis     hnp-lumbar   . PONV (postoperative nausea and vomiting)     last 2 surgeries less nausea  . PONV (postoperative nausea and vomiting)     none with last surgery  . Blood dyscrasia     hx probable blood clot after ankle surgery  not definitive but treated per pt  . High cholesterol   . Heart murmur     years ago  . Pneumonia ~ 2009; 2010  . History of bronchitis   . History of blood transfusion     "16 so far" (07/25/2012)  . 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 Migraines     Surgeries: Procedure(s): MICRODISCECTOMY LUMBAR LAMINECTOMY on 07/25/2012   Consultants:    Discharged Condition: Improved  Hospital Course: JAALAH BLESSINGER is an 46 y.o. female who was admitted 07/25/2012 with a chief complaint of No chief complaint on file. , and found to have a diagnosis of Herniated nucleus pulposus, lumbar.  They were brought to the operating room on 07/25/2012 and underwent the above named procedures.    They were given perioperative antibiotics:  Anti-infectives     Start     Dose/Rate Route Frequency Ordered Stop   07/25/12 1845   clindamycin (CLEOCIN) IVPB 600 mg  Status:  Discontinued        600 mg 100 mL/hr over 30 Minutes Intravenous  Once 07/25/12 1833 07/25/12 2333   07/24/12 1445   clindamycin (CLEOCIN) IVPB 600 mg  Status:  Discontinued        600 mg 100 mL/hr over 30 Minutes Intravenous On call 07/24/12 1443 07/25/12 2346           indicated that she had difficulty with balance and standing and walking and recommended further therapy prior to discharge On postoperative day #1 physical therapy saw this patient. AFO was also recommended.postoperatively the patient's right lower extremity dorsi flexion weakness remained present with good relief of sciatic pain. On postoperative day #2 the patient evaluated and appeared to be much more comfortable after receiving toradol. She was sitting up in bed still showing significant right L5 weakness with EHL 2/5 and right foot dorsi flexion strength 2/5. Sensory diminished in the right L5 and S1 distribution. Her incision was dry with minimal warmth no erythema or fluctuance. An AFO was ordered and patient seen by physical therapy. She was discharged home to continue home health for physical therapy purposes and continued recuperation from micro-discectomy with persistent right L5 residual weakness. Note that the patient was given hydrocortisone IV for 24 hours postoperatively to cover for past history of hyponatremia function noted the last admission. Her blood pressure and vital signs remained stable throughout this hospitalization and the patient was discharged home on 07/27/2012 alert oriented tolerating oral narcotic medicines and regular diet with normal volitional voiding and bowe alll function. They were given sequential compression devices, early ambulation, and chemoprophylaxis for  DVT prophylaxis.  They benefited maximally from their hospital stay and there were no complications.    Recent vital signs:  Filed Vitals:   07/27/12 0545  BP: 106/58  Pulse:   Temp:   Resp:     Recent laboratory studies:  Results for orders placed during the hospital encounter of 07/24/12  CBC      Component Value Range   WBC 7.8  4.0 - 10.5 K/uL   RBC 4.31  3.87 - 5.11 MIL/uL   Hemoglobin 13.6  12.0 - 15.0 g/dL   HCT 45.4  09.8 - 11.9 %   MCV 93.7  78.0 - 100.0 fL   MCH 31.6  26.0 -  34.0 pg   MCHC 33.7  30.0 - 36.0 g/dL   RDW 14.7  82.9 - 56.2 %   Platelets 247  150 - 400 K/uL  COMPREHENSIVE METABOLIC PANEL      Component Value Range   Sodium 135  135 - 145 mEq/L   Potassium 3.6  3.5 - 5.1 mEq/L   Chloride 100  96 - 112 mEq/L   CO2 24  19 - 32 mEq/L   Glucose, Bld 89  70 - 99 mg/dL   BUN 12  6 - 23 mg/dL   Creatinine, Ser 1.30  0.50 - 1.10 mg/dL   Calcium 9.4  8.4 - 86.5 mg/dL   Total Protein 7.0  6.0 - 8.3 g/dL   Albumin 3.8  3.5 - 5.2 g/dL   AST 784 (*) 0 - 37 U/L   ALT 347 (*) 0 - 35 U/L   Alkaline Phosphatase 111  39 - 117 U/L   Total Bilirubin 0.2 (*) 0.3 - 1.2 mg/dL   GFR calc non Af Amer >90  >90 mL/min   GFR calc Af Amer >90  >90 mL/min  URINALYSIS, ROUTINE W REFLEX MICROSCOPIC      Component Value Range   Color, Urine YELLOW  YELLOW   APPearance CLEAR  CLEAR   Specific Gravity, Urine 1.019  1.005 - 1.030   pH 5.5  5.0 - 8.0   Glucose, UA NEGATIVE  NEGATIVE mg/dL   Hgb urine dipstick NEGATIVE  NEGATIVE   Bilirubin Urine NEGATIVE  NEGATIVE   Ketones, ur NEGATIVE  NEGATIVE mg/dL   Protein, ur NEGATIVE  NEGATIVE mg/dL   Urobilinogen, UA 0.2  0.0 - 1.0 mg/dL   Nitrite NEGATIVE  NEGATIVE   Leukocytes, UA NEGATIVE  NEGATIVE  SURGICAL PCR SCREEN      Component Value Range   MRSA, PCR NEGATIVE  NEGATIVE   Staphylococcus aureus NEGATIVE  NEGATIVE    Discharge Medications:     Medication List     As of 07/27/2012  8:04 AM    STOP taking these medications         HYDROmorphone 2 MG tablet   Commonly known as: DILAUDID      TAKE these medications         buPROPion 150 MG 12 hr tablet   Commonly known as: WELLBUTRIN SR   Take 150 mg by mouth daily.      cyclobenzaprine 5 MG tablet   Commonly known as: FLEXERIL   Take 5 mg by mouth Twice daily as needed. For pain      cyclobenzaprine 10 MG tablet   Commonly known as: FLEXERIL   Take 1 tablet (10 mg total) by mouth 3 (three) times daily as needed for muscle spasms.      escitalopram 20  MG  tablet   Commonly known as: LEXAPRO   Take 20 mg by mouth daily before breakfast.      gabapentin 300 MG capsule   Commonly known as: NEURONTIN   Take 300 mg by mouth 2 (two) times daily at 10 AM and 5 PM.      meloxicam 15 MG tablet   Commonly known as: MOBIC   Take 15 mg by mouth daily.      OxyCODONE 20 mg Tb12   Commonly known as: OXYCONTIN   Take 1 tablet (20 mg total) by mouth every 12 (twelve) hours.      oxyCODONE 20 MG 12 hr tablet   Commonly known as: OXYCONTIN   Take 20 mg by mouth every 12 (twelve) hours.      oxyCODONE-acetaminophen 5-325 MG per tablet   Commonly known as: PERCOCET/ROXICET   Take 1-2 tablets by mouth every 4 (four) hours as needed.      rosuvastatin 10 MG tablet   Commonly known as: CRESTOR   Take 10 mg by mouth daily before breakfast.      topiramate 100 MG tablet   Commonly known as: TOPAMAX   Take 150 mg by mouth every morning. For migraines.      zaleplon 10 MG capsule   Commonly known as: SONATA   Take 10 mg by mouth At bedtime as needed. For sleep        Diagnostic Studies: Dg Lumbar Spine 2-3 Views  07/25/2012  *RADIOLOGY REPORT*  Clinical Data: L4-5 microdiskectomy.  LUMBAR SPINE - 2-3 VIEW  Comparison: MR lumbar spine 07/20/2012.  Findings: We are provided with two-view intraoperative views of the lumbar spine in the lateral projection.  Images demonstrate localization of L4-5.  IMPRESSION: L4-5 localization.   Original Report Authenticated By: Bernadene Bell. Maricela Curet, M.D.    Mr Lumbar Spine W Wo Contrast  07/21/2012  *RADIOLOGY REPORT*  Clinical Data: 46 year old female low back pain, bilateral upper leg pain and weakness.  Right symptoms have increased beginning 3 weeks ago.  MRI LUMBAR SPINE WITHOUT AND WITH CONTRAST  Technique:  Multiplanar and multiecho pulse sequences of the lumbar spine were obtained without and with intravenous contrast.  Contrast: 16mL MULTIHANCE GADOBENATE DIMEGLUMINE 529 MG/ML IV SOLN  Comparison:  06/28/2012 and earlier.  Findings: Stable lumbar vertebral height and alignment. No marrow edema or evidence of acute osseous abnormality.   Visualized lower thoracic spinal cord is normal with conus medularis at T12-L1. Postoperative changes on the right at L3-L4 and L4-L5.  Mild enhancement of the right ligament flavum at L3-L4 appears stable. No dural enhancement.  No abnormal intradural enhancement.  Negative visualized abdominal viscera.  Stable paraspinal soft tissues.  T11-T12 through L3-L4 are stable.  L4-L5:  New right lateral recess broad-based caudal disc protrusion/extrusion (series 2 image 6, series 6 image 23).  Severe right lateral recess stenosis.  Previous right partial laminectomy at this level.  Mild overall spinal stenosis.  No definite foraminal involvement.  L5-S1:  Stable, including right paracentral annular tear.  IMPRESSION: 1.  L4-L5 right lateral recess disc protrusion/extrusion has developed since 06/28/2012.  Severe right lateral recess stenosis and mild spinal stenosis at this level. 2.  Otherwise stable lumbar spine.   Original Report Authenticated By: Harley Hallmark, M.D.    Mr Lumbar Spine W Wo Contrast  06/28/2012  *RADIOLOGY REPORT*  Clinical Data: Severe low back pain and bilateral thigh pain.  The patient had microdiskectomy on 03/19/2012 with right L4-5 and L3-4 lateral recess  decompression and excision of HNP on the right at L4- 5.  MRI LUMBAR SPINE WITHOUT AND WITH CONTRAST  Technique:  Multiplanar and multiecho pulse sequences of the lumbar spine were obtained without and with intravenous contrast.  Contrast: 16mL MULTIHANCE GADOBENATE DIMEGLUMINE 529 MG/ML IV SOLN  Comparison: Radiographs dated 02/02/2012 and MRI dated at 03/24/2010  Findings: T11-12 through L2-3:  No significant abnormality.  L3-4:  The disc is normal.  Evidence of right hemilaminotomy with minimal scarring at that site.  The thecal sac and nerve roots appear normal.  L4-5: The disc protrusion noted on  the prior study has been excised.  There is minimal enhancing scar tissue at the operative site.  Right hemilaminectomy or laminotomy with slight enhancing scarring in that region.  No neural impingement.  L5-S1:  There is an annular tear and small disc protrusion central and to the left which is slightly more prominent than on the prior study but there is no neural impingement.  The paraspinal soft tissues are normal.  IMPRESSION:  1.  Slight increase in the small disc protrusion central and to the left at L5-S1 with no neural impingement. 2.  Postsurgical changes at L3-4 and L4-5 with relief of neural impingement.  No new or recurrent disc protrusions at those levels.   Original Report Authenticated By: Gwynn Burly, M.D.     Disposition: 06-Home-Health Care Svc      Discharge Orders    Future Orders Please Complete By Expires   Diet - low sodium heart healthy      Call MD / Call 911      Comments:   If you experience chest pain or shortness of breath, CALL 911 and be transported to the hospital emergency room.  If you develope a fever above 101 F, pus (white drainage) or increased drainage or redness at the wound, or calf pain, call your surgeon's office.   Constipation Prevention      Comments:   Drink plenty of fluids.  Prune juice may be helpful.  You may use a stool softener, such as Colace (over the counter) 100 mg twice a day.  Use MiraLax (over the counter) for constipation as needed.   Increase activity slowly as tolerated      Discharge instructions      Comments:   Walk as tolerated.  Change dressing as needed. May shower with wound covered.  Ice packs to back.  Avoid bending,lifting and twisting.      Follow-up Information    Follow up with NITKA,JAMES E, MD. In 2 weeks.   Contact information:   [address] [place] [address] [phone]           Signed: Kerrin Champagne 07/27/2012, 8:04 AM

## 2012-10-16 ENCOUNTER — Other Ambulatory Visit: Payer: Self-pay | Admitting: Specialist

## 2012-10-16 ENCOUNTER — Ambulatory Visit
Admission: RE | Admit: 2012-10-16 | Discharge: 2012-10-16 | Disposition: A | Discharge status: Home / Self Care | Payer: BC Managed Care – PPO | Source: Ambulatory Visit | Attending: Specialist | Admitting: Specialist

## 2012-10-16 DIAGNOSIS — M79606 Pain in leg, unspecified: Secondary | ICD-10-CM

## 2012-10-16 DIAGNOSIS — M7989 Other specified soft tissue disorders: Secondary | ICD-10-CM

## 2013-04-19 ENCOUNTER — Other Ambulatory Visit: Payer: Self-pay | Admitting: Physician Assistant

## 2013-09-09 ENCOUNTER — Ambulatory Visit: Payer: BC Managed Care – PPO | Attending: Specialist

## 2013-09-09 DIAGNOSIS — R5381 Other malaise: Secondary | ICD-10-CM | POA: Insufficient documentation

## 2013-09-09 DIAGNOSIS — M545 Low back pain, unspecified: Secondary | ICD-10-CM | POA: Insufficient documentation

## 2013-09-09 DIAGNOSIS — M6281 Muscle weakness (generalized): Secondary | ICD-10-CM | POA: Insufficient documentation

## 2013-09-09 DIAGNOSIS — IMO0001 Reserved for inherently not codable concepts without codable children: Secondary | ICD-10-CM | POA: Insufficient documentation

## 2013-09-10 ENCOUNTER — Ambulatory Visit: Payer: BC Managed Care – PPO

## 2013-09-11 ENCOUNTER — Ambulatory Visit: Payer: BC Managed Care – PPO

## 2013-09-17 ENCOUNTER — Ambulatory Visit: Payer: BC Managed Care – PPO | Admitting: Physical Therapy

## 2013-09-18 ENCOUNTER — Ambulatory Visit: Payer: BC Managed Care – PPO

## 2013-09-23 ENCOUNTER — Ambulatory Visit: Payer: BC Managed Care – PPO

## 2013-09-24 ENCOUNTER — Ambulatory Visit: Payer: BC Managed Care – PPO

## 2013-09-25 ENCOUNTER — Ambulatory Visit: Payer: BC Managed Care – PPO

## 2013-10-23 ENCOUNTER — Ambulatory Visit: Payer: BC Managed Care – PPO | Attending: Specialist

## 2013-10-23 DIAGNOSIS — IMO0001 Reserved for inherently not codable concepts without codable children: Secondary | ICD-10-CM | POA: Insufficient documentation

## 2013-10-23 DIAGNOSIS — M545 Low back pain, unspecified: Secondary | ICD-10-CM | POA: Insufficient documentation

## 2013-10-23 DIAGNOSIS — R5381 Other malaise: Secondary | ICD-10-CM | POA: Insufficient documentation

## 2013-10-23 DIAGNOSIS — M6281 Muscle weakness (generalized): Secondary | ICD-10-CM | POA: Insufficient documentation

## 2013-10-25 ENCOUNTER — Ambulatory Visit: Payer: BC Managed Care – PPO | Admitting: Physical Therapy

## 2013-10-28 ENCOUNTER — Ambulatory Visit: Payer: BC Managed Care – PPO | Attending: Specialist | Admitting: Physical Therapy

## 2013-10-28 DIAGNOSIS — M6281 Muscle weakness (generalized): Secondary | ICD-10-CM | POA: Insufficient documentation

## 2013-10-28 DIAGNOSIS — IMO0001 Reserved for inherently not codable concepts without codable children: Secondary | ICD-10-CM | POA: Insufficient documentation

## 2013-10-28 DIAGNOSIS — M545 Low back pain, unspecified: Secondary | ICD-10-CM | POA: Insufficient documentation

## 2013-10-28 DIAGNOSIS — R5381 Other malaise: Secondary | ICD-10-CM | POA: Insufficient documentation

## 2013-10-30 ENCOUNTER — Ambulatory Visit: Payer: BC Managed Care – PPO

## 2013-11-04 ENCOUNTER — Ambulatory Visit: Payer: BC Managed Care – PPO | Admitting: Physical Therapy

## 2013-11-06 ENCOUNTER — Ambulatory Visit: Payer: BC Managed Care – PPO

## 2013-11-11 ENCOUNTER — Ambulatory Visit: Payer: BC Managed Care – PPO

## 2013-11-13 ENCOUNTER — Ambulatory Visit: Payer: BC Managed Care – PPO | Admitting: Physical Therapy

## 2013-11-18 ENCOUNTER — Ambulatory Visit: Payer: BC Managed Care – PPO | Admitting: Physical Therapy

## 2013-11-20 ENCOUNTER — Ambulatory Visit: Payer: BC Managed Care – PPO | Admitting: Physical Therapy

## 2013-11-25 ENCOUNTER — Ambulatory Visit: Payer: BC Managed Care – PPO | Attending: Specialist

## 2013-11-25 DIAGNOSIS — M6281 Muscle weakness (generalized): Secondary | ICD-10-CM | POA: Insufficient documentation

## 2013-11-25 DIAGNOSIS — IMO0001 Reserved for inherently not codable concepts without codable children: Secondary | ICD-10-CM | POA: Insufficient documentation

## 2013-11-25 DIAGNOSIS — R5381 Other malaise: Secondary | ICD-10-CM | POA: Insufficient documentation

## 2013-11-25 DIAGNOSIS — M545 Low back pain, unspecified: Secondary | ICD-10-CM | POA: Insufficient documentation

## 2013-11-27 ENCOUNTER — Ambulatory Visit: Payer: BC Managed Care – PPO

## 2013-12-02 ENCOUNTER — Ambulatory Visit: Payer: BC Managed Care – PPO

## 2013-12-04 ENCOUNTER — Ambulatory Visit: Payer: BC Managed Care – PPO | Admitting: Physical Therapy

## 2013-12-05 ENCOUNTER — Ambulatory Visit: Payer: BC Managed Care – PPO

## 2014-02-13 ENCOUNTER — Other Ambulatory Visit: Payer: Self-pay | Admitting: Specialist

## 2014-02-13 DIAGNOSIS — M545 Low back pain, unspecified: Secondary | ICD-10-CM

## 2014-02-20 ENCOUNTER — Ambulatory Visit
Admission: RE | Admit: 2014-02-20 | Discharge: 2014-02-20 | Disposition: A | Discharge status: Home / Self Care | Payer: BC Managed Care – PPO | Source: Ambulatory Visit | Attending: Specialist | Admitting: Specialist

## 2014-02-20 DIAGNOSIS — M545 Low back pain, unspecified: Secondary | ICD-10-CM

## 2014-02-20 MED ORDER — GADOBENATE DIMEGLUMINE 529 MG/ML IV SOLN
16.0000 mL | Freq: Once | INTRAVENOUS | Status: AC | PRN
  Administered 2014-02-20: 16 mL via INTRAVENOUS

## 2014-02-24 ENCOUNTER — Other Ambulatory Visit (HOSPITAL_COMMUNITY): Payer: Self-pay | Admitting: Specialist

## 2014-02-24 ENCOUNTER — Encounter (HOSPITAL_COMMUNITY): Payer: Self-pay | Admitting: *Deleted

## 2014-02-24 NOTE — H&P (Signed)
[name] [name] is an 48 y.o. female.   Chief Complaint: Low back pain with radiation into both buttocks and posterolateral thighs.  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 She has had persistent weakness And episodic sciatica since then. Has started studies in political science at [employer], episode of severe Back pain with radiation into the right leg, positive neurotension signs 08/2013 treated with physical therapy.  Pain in the buttocks and leg pain has persisted, now with 8 week history of worsening bilateral buttock pain and Worsening weakness into the right lower extremity foot dorsiflexion. No bowel or bladder incontinence. MRI Scan shows a large herniated disc central at the L4-5 level with possibe associated epidural hematoma. She is Requiring narcotic medication continuously percocet 5 mg two every 4 hours. She is having worsening right  Foot dorsiflexion weakness. Positive history of adrenal insufficiency with her last surgery. Has seen her primary Care doctor 02/24/2014 and was started on po potassium replacement. Ambulation less than 150'. Returned to using An AFO after stopped using 5 months ago. Intolerant of ESIs due to adrenal insufficiency due to steroid exposure.  Past Medical History  Diagnosis Date  . Hypertension     BP med. was d/c'd in 02/2012- post op  . Adrenal gland hypofunction     related to post op, vancomycin & lumbar injection, steroids    . Depression   . Cancer     "early evolvng melanoma "- legs   . Arthritis     hnp-lumbar   . PONV (postoperative nausea and vomiting)     last 2 surgeries less nausea  . PONV (postoperative nausea and vomiting)     none with last surgery  . Blood dyscrasia      hx probable blood clot after ankle surgery  not definitive but treated per pt  . High cholesterol   . Heart murmur     years ago  . Pneumonia ~ 2009; 2010  . History of bronchitis   . History of blood transfusion     "16 so far" (07/25/2012)  . 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 Migraines     Past Surgical History  Procedure Laterality Date  . Melanoma excision  06/2011    right lower leg; "early evolving"  . Lumbar laminectomy  03/19/2012    Procedure: MICRODISCECTOMY LUMBAR LAMINECTOMY;  Surgeon: [name], MD;  Location: [facility];  Service: Orthopedics;  Laterality: Right;  MIS Right L3-4 lateral recess decompression and Right L4-5 Microdiscectomy  . Diagnostic laparoscopy      2 cysts removed fr. R ovary   . Laminectomy and microdiscectomy lumbar spine  07/25/2012    redo right L4-5 microdiscectomy  . Tonsillectomy  1977  . Cholecystectomy  2004  . Vaginal hysterectomy  1995    have Ovaries  . Breast biopsy  ~ 2010    left breast, benign   . Breast lumpectomy  ~ 2010    left  . Repair peroneal tendons ankle  2010    right  . Bladder suspension  2006  . Shoulder arthroscopy  ~ 2009    "frozen shoulder manipulation; right" (07/25/2012)  . Lumbar laminectomy  07/25/2012    Procedure: MICRODISCECTOMY LUMBAR LAMINECTOMY;  Surgeon: [name], MD;  Location: [facility];  Service: Orthopedics;  Laterality: N/A;  Re-do Right L4-5 Microdiscectomy  . Colonoscopy      Family History  Problem Relation Age of Onset  . Hypertension Mother   . Coronary artery disease Mother   . Hypertension Father    Social History:  reports that she has never smoked. She has never used smokeless tobacco. She reports that she drinks about 1.2 ounces of alcohol per week. She reports that she does not use illicit drugs.  Allergies:  Allergies  Allergen Reactions  . Penicillins Anaphylaxis  . Sulfa Antibiotics Hives and Other (See Comments)    Hives, mouth blisters   . Ciprofloxacin Hives   . Phenergan [Promethazine Hcl] Other (See Comments)    Caused "severe drop in blood pressure."  . Vancomycin Cross Reactors Rash    Medications Prior to Admission  Medication Sig Dispense Refill  . escitalopram (LEXAPRO) 20 MG tablet Take 20 mg by mouth daily before breakfast.       . hydrochlorothiazide (HYDRODIURIL) 25 MG tablet Take 25 mg by mouth daily.      Marland Kitchen HYDROcodone-acetaminophen (NORCO) 10-325 MG per tablet Take 1 tablet by mouth every 4 (four) hours as needed (takes 1 tablet during the day as needed and 2 tablets at night).      . meloxicam (MOBIC) 15 MG tablet Take 15 mg by mouth daily.      . mometasone (NASONEX) 50 MCG/ACT nasal spray Place 2 sprays into the nose daily.      Marland Kitchen morphine (MSIR) 15 MG tablet Take 15 mg by mouth every 4 (four) hours as needed for severe pain.      . potassium chloride SA (K-DUR,KLOR-CON) 20 MEQ tablet Take 20 mEq by mouth daily.      . rosuvastatin (CRESTOR) 20 MG tablet Take 20 mg by mouth daily.      Marland Kitchen topiramate (TOPAMAX) 50 MG tablet Take 150 mg by mouth daily.      Marland Kitchen buPROPion (WELLBUTRIN SR) 150 MG 12 hr tablet Take 150 mg by mouth daily.       . cyclobenzaprine (FLEXERIL) 10 MG tablet Take 1 tablet (10 mg total) by mouth 3 (three) times daily as needed for muscle spasms.  30 tablet  1  . cyclobenzaprine (FLEXERIL) 5 MG tablet Take 5 mg by mouth Twice daily as needed. For pain      . gabapentin (NEURONTIN) 300 MG capsule Take 300 mg by mouth 2 (two) times daily at 10 AM and 5 PM.      . oxyCODONE (OXYCONTIN) 20 MG 12 hr tablet Take 20 mg by mouth every 12 (twelve) hours.      . OxyCODONE (OXYCONTIN) 20 mg TB12 Take 1 tablet (20 mg total) by mouth every 12 (twelve) hours.  30 tablet  0  . oxyCODONE-acetaminophen (PERCOCET/ROXICET) 5-325 MG per tablet Take 1-2 tablets by mouth every 4 (four) hours as needed.  90 tablet  0  . zaleplon (SONATA) 10 MG capsule Take 10 mg by mouth At bedtime as needed. For sleep        No results found for this  or any previous visit (from the past 48 hour(s)). Dg Chest 2 View  02/25/2014   CLINICAL DATA:  Hypertension  EXAM: CHEST  2 VIEW  COMPARISON:  03/09/2012  FINDINGS: Cardiac shadow is within normal limits. The lungs are hyperinflated bilaterally without focal infiltrate. There is a questionable nodular density in the left apex. This was not as well seen on the prior  exam. No other focal abnormality is seen. Calcified granuloma is again noted in the right lung base.  IMPRESSION: COPD.  Questionable nodular density in the left apex. Nonemergent CT of the chest without contrast would be helpful for further evaluation.   Electronically Signed   By: Inez Catalina M.D.   On: 02/25/2014 07:36    Review of Systems  Constitutional: Negative.   Eyes: Negative.   Respiratory: Negative.   Cardiovascular: Positive for claudication. Negative for chest pain, palpitations, orthopnea, leg swelling and PND.  Gastrointestinal: Negative.   Genitourinary: Negative.   Musculoskeletal: Positive for back pain. Negative for falls, joint pain and neck pain.  Skin: Negative.   Neurological: Positive for tingling, sensory change and focal weakness. Negative for dizziness, tremors, speech change, seizures and loss of consciousness.  Endo/Heme/Allergies: Negative for environmental allergies and polydipsia. Does not bruise/bleed easily.  Psychiatric/Behavioral: Negative for depression, suicidal ideas, hallucinations, memory loss and substance abuse. The patient is not nervous/anxious and does not have insomnia.     Blood pressure 134/57, pulse 88, temperature 97.9 F (36.6 C), temperature source Oral, resp. rate 20, height 5\' 9"  (1.753 m), weight 73.483 kg (162 lb), SpO2 100.00%. Physical Exam  Constitutional: She is oriented to person, place, and time. She appears well-developed and well-nourished. She appears distressed.  HENT:  Head: Normocephalic and atraumatic.  Right Ear: External ear normal.  Left Ear: External ear  normal.  Nose: Nose normal.  Mouth/Throat: Oropharynx is clear and moist. No oropharyngeal exudate.  Eyes: Conjunctivae and EOM are normal. Pupils are equal, round, and reactive to light. Right eye exhibits no discharge. Left eye exhibits no discharge. No scleral icterus.  Neck: Normal range of motion. Neck supple. No JVD present. No tracheal deviation present. No thyromegaly present.  Cardiovascular: Normal rate, regular rhythm, normal heart sounds and intact distal pulses.  Exam reveals no gallop and no friction rub.   No murmur heard. Respiratory: Effort normal and breath sounds normal. No stridor. No respiratory distress. She has no wheezes. She has no rales. She exhibits no tenderness.  GI: Soft. Bowel sounds are normal. She exhibits no distension and no mass. There is no tenderness. There is no rebound and no guarding.  Musculoskeletal: She exhibits tenderness. She exhibits no edema.  Lymphadenopathy:    She has no cervical adenopathy.  Neurological: She is alert and oriented to person, place, and time. She displays normal reflexes. A cranial nerve deficit is present. She exhibits abnormal muscle tone. Coordination normal.  Skin: Skin is warm and dry. No rash noted. She is not diaphoretic. No erythema. No pallor.  Psychiatric: She has a normal mood and affect. Her behavior is normal. Judgment and thought content normal.  Orthopaedic Exam: Awake, alert and oriented x 4. Forward bending limited to 30 degrees. Extension with pain. Reflexes Knee 2+/2+, ankle 1+ right  And 2+left. Neurotension Signs SLR positive on the right 40 degrees short of full extension. Positive popliteal compression sign right knee. Gait with right foot drop. MRI 5/28 central herniated nucleus pulposus L4-5 filling both lateral recesses and causing severe spinal narrowing.   Assessment/Plan Second large recurrent herniated disc L4-5 centrally with persistent sciatica and increasing right lower extremity dorsiflexion  weakness.  History of adrenal insufficiency secondary to cortico steroid exposure. 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  Plan: I have discussed the pathology with [name] and the treatments, both non operative and operative. She is unable to receive further ESIs due to her  adrenal dysfunction associated with this treatment. She has completed a therapy program inspite of therapy is having progression of her lower extremity sciatica and weakness. MRI shows large extruded herniated disc at L4-5 with severe canal narrowing and cauda equina compression. With discussion of the risks and benefits of surgery versus non surgical treatment. She has requested surgical intervention. The risks of surgery, infection, bleeding and neurologic injury discussed with [name] and she understands that the chance of improvement In her sciatica and leg signs and symptoms is greater than 95%. There is 5-10% risk of recurrence even with having had 2 recurrent herniated discs. If a third recurrence she should strongly consider either fusion of disc arthroplasty. She wishes to proceed with bilateral microdiscectomy for recurrent central herniated disc L4-5.  [name] 02/25/2014, 8:40 AM

## 2014-02-24 NOTE — Progress Notes (Signed)
Pt states she had blood drawn at Dr. Danna Hefty office last week and has a copy of the results. She will bring this with her morning of surgery. She states she had an EKG done in December, 2014 also. Unable to request it from office due to time. She states she will bring all her paperwork from Dr. Danna Hefty office and it might be in it.

## 2014-02-25 ENCOUNTER — Encounter (HOSPITAL_COMMUNITY): Payer: BC Managed Care – PPO | Admitting: Anesthesiology

## 2014-02-25 ENCOUNTER — Ambulatory Visit (HOSPITAL_COMMUNITY): Payer: BC Managed Care – PPO

## 2014-02-25 ENCOUNTER — Encounter (HOSPITAL_COMMUNITY)
Admission: RE | Disposition: A | Discharge status: Home / Self Care | Payer: Self-pay | Source: Ambulatory Visit | Attending: Specialist

## 2014-02-25 ENCOUNTER — Encounter (HOSPITAL_COMMUNITY): Payer: Self-pay | Admitting: Anesthesiology

## 2014-02-25 ENCOUNTER — Ambulatory Visit (HOSPITAL_COMMUNITY)
Admission: RE | Admit: 2014-02-25 | Discharge: 2014-02-26 | Disposition: A | Discharge status: Home / Self Care | Payer: BC Managed Care – PPO | Source: Ambulatory Visit | Attending: Specialist | Admitting: Specialist

## 2014-02-25 ENCOUNTER — Ambulatory Visit (HOSPITAL_COMMUNITY): Payer: BC Managed Care – PPO | Admitting: Anesthesiology

## 2014-02-25 DIAGNOSIS — E7800 Pure hypercholesterolemia, unspecified: Secondary | ICD-10-CM | POA: Insufficient documentation

## 2014-02-25 DIAGNOSIS — I10 Essential (primary) hypertension: Secondary | ICD-10-CM | POA: Insufficient documentation

## 2014-02-25 DIAGNOSIS — M5126 Other intervertebral disc displacement, lumbar region: Secondary | ICD-10-CM | POA: Diagnosis present

## 2014-02-25 DIAGNOSIS — M51379 Other intervertebral disc degeneration, lumbosacral region without mention of lumbar back pain or lower extremity pain: Secondary | ICD-10-CM | POA: Insufficient documentation

## 2014-02-25 DIAGNOSIS — G43909 Migraine, unspecified, not intractable, without status migrainosus: Secondary | ICD-10-CM | POA: Insufficient documentation

## 2014-02-25 DIAGNOSIS — F3289 Other specified depressive episodes: Secondary | ICD-10-CM | POA: Insufficient documentation

## 2014-02-25 DIAGNOSIS — Z85820 Personal history of malignant melanoma of skin: Secondary | ICD-10-CM | POA: Insufficient documentation

## 2014-02-25 DIAGNOSIS — M5137 Other intervertebral disc degeneration, lumbosacral region: Secondary | ICD-10-CM | POA: Insufficient documentation

## 2014-02-25 DIAGNOSIS — F329 Major depressive disorder, single episode, unspecified: Secondary | ICD-10-CM | POA: Insufficient documentation

## 2014-02-25 HISTORY — PX: LUMBAR LAMINECTOMY: SHX95

## 2014-02-25 LAB — SURGICAL PCR SCREEN
MRSA, PCR: NEGATIVE
Staphylococcus aureus: NEGATIVE

## 2014-02-25 SURGERY — MICRODISCECTOMY LUMBAR LAMINECTOMY
Anesthesia: General

## 2014-02-25 MED ORDER — HYDROMORPHONE HCL PF 1 MG/ML IJ SOLN
0.5000 mg | INTRAMUSCULAR | Status: DC | PRN
  Administered 2014-02-25 (×2): 1 mg via INTRAVENOUS
  Filled 2014-02-25: qty 1

## 2014-02-25 MED ORDER — FENTANYL CITRATE 0.05 MG/ML IJ SOLN
INTRAMUSCULAR | Status: DC | PRN
  Administered 2014-02-25 (×2): 50 ug via INTRAVENOUS

## 2014-02-25 MED ORDER — KETOROLAC TROMETHAMINE 30 MG/ML IJ SOLN
INTRAMUSCULAR | Status: AC
  Filled 2014-02-25: qty 1

## 2014-02-25 MED ORDER — BUPROPION HCL ER (SR) 150 MG PO TB12
150.0000 mg | ORAL_TABLET | Freq: Every day | ORAL | Status: DC
  Filled 2014-02-25: qty 1

## 2014-02-25 MED ORDER — POLYETHYLENE GLYCOL 3350 17 G PO PACK: 17.0000 g | PACK | Freq: Every day | ORAL | Status: DC | PRN

## 2014-02-25 MED ORDER — BUPIVACAINE HCL (PF) 0.25 % IJ SOLN
INTRAMUSCULAR | Status: AC
  Filled 2014-02-25: qty 30

## 2014-02-25 MED ORDER — CLINDAMYCIN PHOSPHATE 600 MG/50ML IV SOLN
600.0000 mg | Freq: Three times a day (TID) | INTRAVENOUS | Status: AC
  Administered 2014-02-25 – 2014-02-26 (×3): 600 mg via INTRAVENOUS
  Filled 2014-02-25 (×3): qty 50

## 2014-02-25 MED ORDER — ONDANSETRON HCL 4 MG/2ML IJ SOLN: 4.0000 mg | INTRAMUSCULAR | Status: DC | PRN

## 2014-02-25 MED ORDER — GABAPENTIN 300 MG PO CAPS
300.0000 mg | ORAL_CAPSULE | Freq: Two times a day (BID) | ORAL | Status: DC
  Administered 2014-02-25: 300 mg via ORAL
  Filled 2014-02-25 (×3): qty 1

## 2014-02-25 MED ORDER — CYCLOBENZAPRINE HCL 10 MG PO TABS
10.0000 mg | ORAL_TABLET | Freq: Three times a day (TID) | ORAL | Status: DC | PRN
  Administered 2014-02-25: 10 mg via ORAL
  Filled 2014-02-25: qty 1

## 2014-02-25 MED ORDER — OXYCODONE-ACETAMINOPHEN 5-325 MG PO TABS
1.0000 | ORAL_TABLET | ORAL | Status: DC | PRN
  Administered 2014-02-25 (×2): 2 via ORAL
  Filled 2014-02-25 (×2): qty 2

## 2014-02-25 MED ORDER — BUPIVACAINE LIPOSOME 1.3 % IJ SUSP
INTRAMUSCULAR | Status: DC | PRN
  Administered 2014-02-25: 10 mL

## 2014-02-25 MED ORDER — POTASSIUM CHLORIDE CRYS ER 20 MEQ PO TBCR
20.0000 meq | EXTENDED_RELEASE_TABLET | Freq: Every day | ORAL | Status: DC
Start: 2014-02-26 — End: 2014-02-26
  Administered 2014-02-26: 20 meq via ORAL
  Filled 2014-02-25: qty 1

## 2014-02-25 MED ORDER — KETOROLAC TROMETHAMINE 30 MG/ML IJ SOLN
30.0000 mg | Freq: Once | INTRAMUSCULAR | Status: AC
  Administered 2014-02-25: 30 mg via INTRAVENOUS

## 2014-02-25 MED ORDER — NEOSTIGMINE METHYLSULFATE 10 MG/10ML IV SOLN
INTRAVENOUS | Status: AC
  Filled 2014-02-25: qty 1

## 2014-02-25 MED ORDER — SODIUM CHLORIDE 0.9 % IJ SOLN: 3.0000 mL | INTRAMUSCULAR | Status: DC | PRN

## 2014-02-25 MED ORDER — LIDOCAINE HCL (CARDIAC) 20 MG/ML IV SOLN
INTRAVENOUS | Status: DC | PRN
  Administered 2014-02-25: 70 mg via INTRAVENOUS
  Administered 2014-02-25: 30 mg via INTRAVENOUS

## 2014-02-25 MED ORDER — MIDAZOLAM HCL 5 MG/5ML IJ SOLN
INTRAMUSCULAR | Status: DC | PRN
Start: 2014-02-25 — End: 2014-02-25
  Administered 2014-02-25: 2 mg via INTRAVENOUS

## 2014-02-25 MED ORDER — DEXAMETHASONE SODIUM PHOSPHATE 4 MG/ML IJ SOLN
INTRAMUSCULAR | Status: AC
  Filled 2014-02-25: qty 1

## 2014-02-25 MED ORDER — BUPIVACAINE HCL (PF) 0.25 % IJ SOLN
INTRAMUSCULAR | Status: DC | PRN
  Administered 2014-02-25: 20 mL

## 2014-02-25 MED ORDER — PROPOFOL 10 MG/ML IV BOLUS
INTRAVENOUS | Status: DC | PRN
Start: 2014-02-25 — End: 2014-02-25
  Administered 2014-02-25: 170 mg via INTRAVENOUS

## 2014-02-25 MED ORDER — ONDANSETRON HCL 4 MG/2ML IJ SOLN
INTRAMUSCULAR | Status: AC
  Filled 2014-02-25: qty 2

## 2014-02-25 MED ORDER — ONDANSETRON HCL 4 MG/2ML IJ SOLN: 4.0000 mg | Freq: Four times a day (QID) | INTRAMUSCULAR | Status: DC | PRN

## 2014-02-25 MED ORDER — OXYCODONE HCL 5 MG/5ML PO SOLN: 5.0000 mg | Freq: Once | ORAL | Status: AC | PRN

## 2014-02-25 MED ORDER — OXYCODONE HCL 5 MG PO TABS
5.0000 mg | ORAL_TABLET | Freq: Once | ORAL | Status: AC | PRN
  Administered 2014-02-25: 5 mg via ORAL

## 2014-02-25 MED ORDER — FLEET ENEMA 7-19 GM/118ML RE ENEM: 1.0000 | ENEMA | Freq: Once | RECTAL | Status: AC | PRN

## 2014-02-25 MED ORDER — CHLORHEXIDINE GLUCONATE 4 % EX LIQD
60.0000 mL | Freq: Once | CUTANEOUS | Status: DC
Start: 2014-02-25 — End: 2014-02-25
  Filled 2014-02-25: qty 60

## 2014-02-25 MED ORDER — FLUTICASONE PROPIONATE 50 MCG/ACT NA SUSP
1.0000 | Freq: Every day | NASAL | Status: DC
  Filled 2014-02-25: qty 16

## 2014-02-25 MED ORDER — MIDAZOLAM HCL 2 MG/2ML IJ SOLN
INTRAMUSCULAR | Status: AC
  Filled 2014-02-25: qty 2

## 2014-02-25 MED ORDER — ROCURONIUM BROMIDE 50 MG/5ML IV SOLN
INTRAVENOUS | Status: AC
  Filled 2014-02-25: qty 1

## 2014-02-25 MED ORDER — ARTIFICIAL TEARS OP OINT
TOPICAL_OINTMENT | OPHTHALMIC | Status: DC | PRN
  Administered 2014-02-25: 1 via OPHTHALMIC

## 2014-02-25 MED ORDER — NEOSTIGMINE METHYLSULFATE 10 MG/10ML IV SOLN
INTRAVENOUS | Status: DC | PRN
  Administered 2014-02-25: 3 mg via INTRAVENOUS

## 2014-02-25 MED ORDER — PHENOL 1.4 % MT LIQD: 1.0000 | OROMUCOSAL | Status: DC | PRN

## 2014-02-25 MED ORDER — ESCITALOPRAM OXALATE 20 MG PO TABS
20.0000 mg | ORAL_TABLET | Freq: Every day | ORAL | Status: DC
  Administered 2014-02-26: 20 mg via ORAL
  Filled 2014-02-25 (×2): qty 1

## 2014-02-25 MED ORDER — HYDROMORPHONE HCL PF 1 MG/ML IJ SOLN
INTRAMUSCULAR | Status: AC
  Filled 2014-02-25: qty 1

## 2014-02-25 MED ORDER — FENTANYL CITRATE 0.05 MG/ML IJ SOLN
100.0000 ug | Freq: Once | INTRAMUSCULAR | Status: AC
  Administered 2014-02-25 (×2): 50 ug via INTRAVENOUS

## 2014-02-25 MED ORDER — TOPIRAMATE 25 MG PO TABS
150.0000 mg | ORAL_TABLET | Freq: Every day | ORAL | Status: DC
  Administered 2014-02-26: 150 mg via ORAL
  Filled 2014-02-25: qty 2

## 2014-02-25 MED ORDER — ROCURONIUM BROMIDE 100 MG/10ML IV SOLN
INTRAVENOUS | Status: DC | PRN
  Administered 2014-02-25: 10 mg via INTRAVENOUS
  Administered 2014-02-25: 50 mg via INTRAVENOUS

## 2014-02-25 MED ORDER — MUPIROCIN 2 % EX OINT
TOPICAL_OINTMENT | CUTANEOUS | Status: AC
  Administered 2014-02-25: 1
  Filled 2014-02-25: qty 22

## 2014-02-25 MED ORDER — ACETAMINOPHEN 650 MG RE SUPP: 650.0000 mg | RECTAL | Status: DC | PRN

## 2014-02-25 MED ORDER — OXYCODONE-ACETAMINOPHEN 5-325 MG PO TABS
1.0000 | ORAL_TABLET | ORAL | Status: DC | PRN
  Administered 2014-02-26 (×2): 2 via ORAL
  Filled 2014-02-25 (×2): qty 2

## 2014-02-25 MED ORDER — LACTATED RINGERS IV SOLN: INTRAVENOUS | Status: DC

## 2014-02-25 MED ORDER — LIDOCAINE HCL (CARDIAC) 20 MG/ML IV SOLN
INTRAVENOUS | Status: AC
  Filled 2014-02-25: qty 5

## 2014-02-25 MED ORDER — SODIUM CHLORIDE 0.9 % IJ SOLN
3.0000 mL | Freq: Two times a day (BID) | INTRAMUSCULAR | Status: DC
  Administered 2014-02-25: 3 mL via INTRAVENOUS

## 2014-02-25 MED ORDER — ATORVASTATIN CALCIUM 40 MG PO TABS
40.0000 mg | ORAL_TABLET | Freq: Every day | ORAL | Status: DC
  Administered 2014-02-25: 40 mg via ORAL
  Filled 2014-02-25 (×2): qty 1

## 2014-02-25 MED ORDER — THROMBIN 5000 UNITS EX SOLR
CUTANEOUS | Status: AC
  Filled 2014-02-25: qty 5000

## 2014-02-25 MED ORDER — THROMBIN 5000 UNITS EX SOLR
OROMUCOSAL | Status: DC | PRN
  Administered 2014-02-25: 10:00:00 via TOPICAL

## 2014-02-25 MED ORDER — GLYCOPYRROLATE 0.2 MG/ML IJ SOLN
INTRAMUSCULAR | Status: AC
  Filled 2014-02-25: qty 2

## 2014-02-25 MED ORDER — HYDROMORPHONE HCL PF 1 MG/ML IJ SOLN
0.2500 mg | INTRAMUSCULAR | Status: DC | PRN
  Administered 2014-02-25 (×2): 0.25 mg via INTRAVENOUS

## 2014-02-25 MED ORDER — DEXAMETHASONE SODIUM PHOSPHATE 4 MG/ML IJ SOLN
INTRAMUSCULAR | Status: DC | PRN
  Administered 2014-02-25: 4 mg via INTRAVENOUS

## 2014-02-25 MED ORDER — SCOPOLAMINE 1 MG/3DAYS TD PT72
MEDICATED_PATCH | TRANSDERMAL | Status: AC
  Administered 2014-02-25: 1.5 mg via TRANSDERMAL
  Filled 2014-02-25: qty 1

## 2014-02-25 MED ORDER — MELOXICAM 15 MG PO TABS
15.0000 mg | ORAL_TABLET | Freq: Every day | ORAL | Status: DC
  Administered 2014-02-26: 15 mg via ORAL
  Filled 2014-02-25: qty 1

## 2014-02-25 MED ORDER — HYDROCODONE-ACETAMINOPHEN 5-325 MG PO TABS: 1.0000 | ORAL_TABLET | ORAL | Status: DC | PRN

## 2014-02-25 MED ORDER — ARTIFICIAL TEARS OP OINT
TOPICAL_OINTMENT | OPHTHALMIC | Status: AC
  Filled 2014-02-25: qty 3.5

## 2014-02-25 MED ORDER — MENTHOL 3 MG MT LOZG: 1.0000 | LOZENGE | OROMUCOSAL | Status: DC | PRN

## 2014-02-25 MED ORDER — ONDANSETRON HCL 4 MG/2ML IJ SOLN
INTRAMUSCULAR | Status: DC | PRN
  Administered 2014-02-25 (×2): 4 mg via INTRAVENOUS

## 2014-02-25 MED ORDER — ALUM & MAG HYDROXIDE-SIMETH 200-200-20 MG/5ML PO SUSP: 30.0000 mL | Freq: Four times a day (QID) | ORAL | Status: DC | PRN

## 2014-02-25 MED ORDER — GLYCOPYRROLATE 0.2 MG/ML IJ SOLN
INTRAMUSCULAR | Status: DC | PRN
  Administered 2014-02-25: 0.2 mg via INTRAVENOUS
  Administered 2014-02-25: 0.4 mg via INTRAVENOUS

## 2014-02-25 MED ORDER — HYDROCHLOROTHIAZIDE 25 MG PO TABS
25.0000 mg | ORAL_TABLET | Freq: Every day | ORAL | Status: DC
  Filled 2014-02-25: qty 1

## 2014-02-25 MED ORDER — POTASSIUM CHLORIDE IN NACL 20-0.9 MEQ/L-% IV SOLN
INTRAVENOUS | Status: DC
  Administered 2014-02-25: 18:00:00 via INTRAVENOUS
  Filled 2014-02-25 (×2): qty 1000

## 2014-02-25 MED ORDER — DOCUSATE SODIUM 100 MG PO CAPS
100.0000 mg | ORAL_CAPSULE | Freq: Two times a day (BID) | ORAL | Status: DC
  Administered 2014-02-25 – 2014-02-26 (×2): 100 mg via ORAL
  Filled 2014-02-25 (×3): qty 1

## 2014-02-25 MED ORDER — ACETAMINOPHEN 325 MG PO TABS: 650.0000 mg | ORAL_TABLET | ORAL | Status: DC | PRN

## 2014-02-25 MED ORDER — FENTANYL CITRATE 0.05 MG/ML IJ SOLN
INTRAMUSCULAR | Status: AC
  Administered 2014-02-25: 50 ug via INTRAVENOUS
  Filled 2014-02-25: qty 2

## 2014-02-25 MED ORDER — SCOPOLAMINE 1 MG/3DAYS TD PT72
1.0000 | MEDICATED_PATCH | TRANSDERMAL | Status: DC
  Administered 2014-02-25: 1.5 mg via TRANSDERMAL
  Filled 2014-02-25: qty 1

## 2014-02-25 MED ORDER — ZOLPIDEM TARTRATE 5 MG PO TABS: 5.0000 mg | ORAL_TABLET | Freq: Every evening | ORAL | Status: DC | PRN

## 2014-02-25 MED ORDER — BUPIVACAINE LIPOSOME 1.3 % IJ SUSP
20.0000 mL | Freq: Once | INTRAMUSCULAR | Status: DC
  Filled 2014-02-25: qty 20

## 2014-02-25 MED ORDER — DEXAMETHASONE SODIUM PHOSPHATE 4 MG/ML IJ SOLN
4.0000 mg | Freq: Two times a day (BID) | INTRAMUSCULAR | Status: DC
Start: 2014-02-25 — End: 2014-02-26
  Administered 2014-02-25: 4 mg via INTRAVENOUS
  Filled 2014-02-25 (×2): qty 1

## 2014-02-25 MED ORDER — CLINDAMYCIN PHOSPHATE 900 MG/50ML IV SOLN
INTRAVENOUS | Status: AC
  Administered 2014-02-25: 900 mg via INTRAVENOUS
  Filled 2014-02-25: qty 50

## 2014-02-25 MED ORDER — 0.9 % SODIUM CHLORIDE (POUR BTL) OPTIME
TOPICAL | Status: DC | PRN
  Administered 2014-02-25: 1000 mL

## 2014-02-25 MED ORDER — OXYCODONE HCL ER 20 MG PO T12A
40.0000 mg | EXTENDED_RELEASE_TABLET | Freq: Two times a day (BID) | ORAL | Status: DC
  Administered 2014-02-25 – 2014-02-26 (×2): 40 mg via ORAL
  Filled 2014-02-25 (×3): qty 2

## 2014-02-25 MED ORDER — FENTANYL CITRATE 0.05 MG/ML IJ SOLN
INTRAMUSCULAR | Status: AC
  Filled 2014-02-25: qty 5

## 2014-02-25 MED ORDER — PROPOFOL 10 MG/ML IV BOLUS
INTRAVENOUS | Status: AC
  Filled 2014-02-25: qty 20

## 2014-02-25 MED ORDER — CLINDAMYCIN PHOSPHATE 900 MG/50ML IV SOLN: 900.0000 mg | INTRAVENOUS | Status: DC

## 2014-02-25 MED ORDER — OXYCODONE HCL 5 MG PO TABS
ORAL_TABLET | ORAL | Status: AC
  Filled 2014-02-25: qty 1

## 2014-02-25 MED ORDER — DEXAMETHASONE 4 MG PO TABS
4.0000 mg | ORAL_TABLET | Freq: Two times a day (BID) | ORAL | Status: DC
  Filled 2014-02-25 (×2): qty 1

## 2014-02-25 MED ORDER — LACTATED RINGERS IV SOLN
INTRAVENOUS | Status: DC | PRN
  Administered 2014-02-25 (×2): via INTRAVENOUS

## 2014-02-25 MED ORDER — BISACODYL 10 MG RE SUPP: 10.0000 mg | Freq: Every day | RECTAL | Status: DC | PRN

## 2014-02-25 MED ORDER — SODIUM CHLORIDE 0.9 % IV SOLN: 250.0000 mL | INTRAVENOUS | Status: DC

## 2014-02-25 SURGICAL SUPPLY — 53 items
BUR RND FLUTED 2.5 (BURR) IMPLANT
BUR ROUND FLUTED 4 SOFT TCH (BURR) ×2 IMPLANT
BUR SABER RD CUTTING 3.0 (BURR) ×2 IMPLANT
CANISTER SUCTION 2500CC (MISCELLANEOUS) ×2 IMPLANT
CORDS BIPOLAR (ELECTRODE) ×2 IMPLANT
COVER MAYO STAND STRL (DRAPES) ×2 IMPLANT
COVER SURGICAL LIGHT HANDLE (MISCELLANEOUS) ×2 IMPLANT
DERMABOND ADVANCED (GAUZE/BANDAGES/DRESSINGS) ×1
DERMABOND ADVANCED .7 DNX12 (GAUZE/BANDAGES/DRESSINGS) ×1 IMPLANT
DRAPE C-ARM 42X72 X-RAY (DRAPES) ×2 IMPLANT
DRAPE MICROSCOPE LEICA (MISCELLANEOUS) ×2 IMPLANT
DRAPE POUCH INSTRU U-SHP 10X18 (DRAPES) ×2 IMPLANT
DRAPE PROXIMA HALF (DRAPES) IMPLANT
DRAPE SURG 17X23 STRL (DRAPES) ×6 IMPLANT
DRSG MEPILEX BORDER 4X4 (GAUZE/BANDAGES/DRESSINGS) ×2 IMPLANT
DRSG MEPILEX BORDER 4X8 (GAUZE/BANDAGES/DRESSINGS) IMPLANT
DURAPREP 26ML APPLICATOR (WOUND CARE) ×2 IMPLANT
ELECT BLADE 4.0 EZ CLEAN MEGAD (MISCELLANEOUS) ×2
ELECT CAUTERY BLADE 6.4 (BLADE) ×2 IMPLANT
ELECT REM PT RETURN 9FT ADLT (ELECTROSURGICAL) ×2
ELECTRODE BLDE 4.0 EZ CLN MEGD (MISCELLANEOUS) ×1 IMPLANT
ELECTRODE REM PT RTRN 9FT ADLT (ELECTROSURGICAL) ×1 IMPLANT
GLOVE BIOGEL PI IND STRL 7.5 (GLOVE) ×1 IMPLANT
GLOVE BIOGEL PI INDICATOR 7.5 (GLOVE) ×1
GLOVE ECLIPSE 7.0 STRL STRAW (GLOVE) ×2 IMPLANT
GLOVE ECLIPSE 8.5 STRL (GLOVE) ×2 IMPLANT
GLOVE SURG 8.5 LATEX PF (GLOVE) ×2 IMPLANT
GOWN STRL REUS W/ TWL LRG LVL3 (GOWN DISPOSABLE) ×2 IMPLANT
GOWN STRL REUS W/TWL 2XL LVL3 (GOWN DISPOSABLE) ×2 IMPLANT
GOWN STRL REUS W/TWL LRG LVL3 (GOWN DISPOSABLE) ×2
KIT BASIN OR (CUSTOM PROCEDURE TRAY) ×2 IMPLANT
KIT ROOM TURNOVER OR (KITS) ×2 IMPLANT
NEEDLE 22X1 1/2 (OR ONLY) (NEEDLE) ×2 IMPLANT
NEEDLE SPNL 18GX3.5 QUINCKE PK (NEEDLE) ×4 IMPLANT
NS IRRIG 1000ML POUR BTL (IV SOLUTION) ×2 IMPLANT
PACK LAMINECTOMY ORTHO (CUSTOM PROCEDURE TRAY) ×2 IMPLANT
PAD ARMBOARD 7.5X6 YLW CONV (MISCELLANEOUS) ×4 IMPLANT
PATTIES SURGICAL .5 X.5 (GAUZE/BANDAGES/DRESSINGS) IMPLANT
PATTIES SURGICAL .75X.75 (GAUZE/BANDAGES/DRESSINGS) IMPLANT
SPONGE LAP 4X18 X RAY DECT (DISPOSABLE) IMPLANT
SPONGE SURGIFOAM ABS GEL 100 (HEMOSTASIS) IMPLANT
SUT VIC AB 1 CT1 27 (SUTURE)
SUT VIC AB 1 CT1 27XBRD ANBCTR (SUTURE) IMPLANT
SUT VIC AB 2-0 CT1 27 (SUTURE)
SUT VIC AB 2-0 CT1 TAPERPNT 27 (SUTURE) IMPLANT
SUT VICRYL 0 UR6 27IN ABS (SUTURE) IMPLANT
SUT VICRYL 4-0 PS2 18IN ABS (SUTURE) IMPLANT
SYR 20CC LL (SYRINGE) ×2 IMPLANT
SYR CONTROL 10ML LL (SYRINGE) ×2 IMPLANT
TOWEL OR 17X24 6PK STRL BLUE (TOWEL DISPOSABLE) ×2 IMPLANT
TOWEL OR 17X26 10 PK STRL BLUE (TOWEL DISPOSABLE) ×2 IMPLANT
TRAY FOLEY CATH 16FRSI W/METER (SET/KITS/TRAYS/PACK) IMPLANT
WATER STERILE IRR 1000ML POUR (IV SOLUTION) ×2 IMPLANT

## 2014-02-25 NOTE — Discharge Instructions (Signed)
    No lifting greater than 10 lbs. Avoid bending, stooping and twisting. Walk in house for first week them may start to get out slowly increasing distance up to one mile by 3 weeks post op. Keep incision dry for 3 days, may use tegaderm or similar water impervious dressing.  

## 2014-02-25 NOTE — Anesthesia Preprocedure Evaluation (Signed)
Anesthesia Evaluation  Patient identified by MRN, date of birth, ID band Patient awake    Reviewed: Allergy & Precautions, H&P , NPO status , Patient's Chart, lab work & pertinent test results  History of Anesthesia Complications (+) PONV  Airway Mallampati: II  Neck ROM: full    Dental   Pulmonary          Cardiovascular hypertension,     Neuro/Psych  Headaches, Depression    GI/Hepatic   Endo/Other    Renal/GU      Musculoskeletal  (+) Arthritis -,   Abdominal   Peds  Hematology   Anesthesia Other Findings   Reproductive/Obstetrics                           Anesthesia Physical Anesthesia Plan  ASA: II  Anesthesia Plan: General   Post-op Pain Management:    Induction: Intravenous  Airway Management Planned: Oral ETT  Additional Equipment:   Intra-op Plan:   Post-operative Plan: Extubation in OR  Informed Consent: I have reviewed the patients History and Physical, chart, labs and discussed the procedure including the risks, benefits and alternatives for the proposed anesthesia with the patient or authorized representative who has indicated his/her understanding and acceptance.     Plan Discussed with: CRNA, Anesthesiologist and Surgeon  Anesthesia Plan Comments:         Anesthesia Quick Evaluation

## 2014-02-25 NOTE — Evaluation (Signed)
Physical Therapy Evaluation Patient Details Name: [name] [name] MRN: [id number] DOB: Oct 02, 1965 Today's Date: 02/25/2014   History of Present Illness  48 y.o. female s/p L4-5 microdiscectomy. Hx of HTN and adrenal gland hypofunction.  Clinical Impression  Patient is seen following the above procedure and presents with functional limitations due to the deficits listed below (see PT Problem List). Pt ambulates up to 55 feet with min assist post op day #0, demonstrating some loss of balance to posterior requiring assistance from PT to correct. Patient will benefit from skilled PT to increase their independence and safety with mobility to allow discharge to the venue listed below. Will complete stair training in AM and further assess possible need for HHPT due to decreased balance and history of falls.      Follow Up Recommendations Home health PT;Supervision for mobility/OOB    Equipment Recommendations  None recommended by PT    Recommendations for Other Services OT consult     Precautions / Restrictions Precautions Precautions: Back;Fall Precaution Booklet Issued: Yes (comment) Precaution Comments: reviewed precautions. History of falls Required Braces or Orthoses:  (no braces order - ) Restrictions Weight Bearing Restrictions: No      Mobility  Bed Mobility Overal bed mobility: Modified Independent             General bed mobility comments: Pt performed log roll without assistance. States she has done many times in past due to surgeries.  Transfers Overall transfer level: Needs assistance Equipment used: Rolling walker (2 wheeled) Transfers: Sit to/from Stand Sit to Stand: +2 safety/equipment;Min assist         General transfer comment: Min assist with sit<>stand second person available for safety. LOB to posterior needing min assist to regain.  Cues for hand placement and to maintain back precautions.  Ambulation/Gait Ambulation/Gait assistance: Min assist;+2  safety/equipment Ambulation Distance (Feet): 55 Feet Assistive device: Rolling walker (2 wheeled) Gait Pattern/deviations: Step-through pattern;Decreased stride length;Trunk flexed Gait velocity: decreased   General Gait Details: Instability with RLE which increases as she fatigues. Min assist as she gets tired for RW control. Requires verbal cues for upright posture and RW placement. Encouraged to use UEs on RW to decrease WB through LE and increase safety. No instances of right foot drop, demonstrating good ability to clear RLE in R swing phase. Two instances of LOB to posterior when initially starting to ambulate requiring min assist to correct.  Stairs            Wheelchair Mobility    Modified [name] (Stroke Patients Only)       Balance Overall balance assessment: Needs assistance;History of Falls Sitting-balance support: No upper extremity supported;Feet supported Sitting balance-Leahy Scale: Fair Sitting balance - Comments: Sits EOB without support   Standing balance support: Bilateral upper extremity supported Standing balance-Leahy Scale: Poor Standing balance comment: BIL UEs to safely stand                             Pertinent Vitals/Pain Pt reports pain as high - nurse in room at start of therapy session and provided pain medication. Patient repositioned in chair for comfort.     Home Living Family/patient expects to be discharged to:: Private residence Living Arrangements: Spouse/significant other Available Help at Discharge: Family;Available 24 hours/day Type of Home: House Home Access: Stairs to enter Entrance Stairs-Rails: None Entrance Stairs-Number of Steps: 3 Home Layout: Two level;Able to live on main level with bedroom/bathroom Home Equipment:  Walker - 2 wheels;Bedside commode;Shower seat      Prior Function Level of Independence: Needs assistance   Gait / Transfers Assistance Needed: ambulated without assistive device but had  several falls  ADL's / Homemaking Assistance Needed: needed assistance with dressing        Hand Dominance   Dominant Hand: Right    Extremity/Trunk Assessment   Upper Extremity Assessment: Defer to OT evaluation           Lower Extremity Assessment: RLE deficits/detail RLE Deficits / Details: MMT: hip flexion 4-/5, knee extension 4-/5, knee flexion 4-/5, ankle dorsiflexion (difficult to assess- at least 3/5 with ambulation but could not hold for MMT in gravity eliminated position. achilles reflex 3+       Communication   Communication: No difficulties  Cognition Arousal/Alertness: Awake/alert Behavior During Therapy: WFL for tasks assessed/performed Overall Cognitive Status: Within Functional Limits for tasks assessed                      General Comments General comments (skin integrity, edema, etc.): Reviewed back precautions and discussed d/c planning. Pt states she understands that she needs to use RW for support at d/c due to balance issues as she was not previously using one.    Exercises        Assessment/Plan    PT Assessment Patient needs continued PT services  PT Diagnosis Difficulty walking;Abnormality of gait;Acute pain   PT Problem List Decreased strength;Decreased range of motion;Decreased activity tolerance;Decreased balance;Decreased mobility;Decreased knowledge of use of DME;Pain  PT Treatment Interventions DME instruction;Gait training;Stair training;Functional mobility training;Therapeutic activities;Therapeutic exercise;Balance training;Neuromuscular re-education;Patient/family education;Modalities   PT Goals (Current goals can be found in the Care Plan section) Acute Rehab PT Goals Patient Stated Goal: go home and finish school PT Goal Formulation: With patient Time For Goal Achievement: 03/04/14 Potential to Achieve Goals: Good    Frequency Min 5X/week   Barriers to discharge        Co-evaluation               End of  Session Equipment Utilized During Treatment: Gait belt Activity Tolerance: Patient tolerated treatment well Patient left: in chair;with call bell/phone within reach;with family/visitor present Nurse Communication: Mobility status    Functional Assessment Tool Used: Clinical judgement/observation Functional Limitation: Mobility: Walking and moving around Mobility: Walking and Moving Around Current Status [phone]): At least 20 percent but less than 40 percent impaired, limited or restricted Mobility: Walking and Moving Around Goal Status [phone]): At least 1 percent but less than 20 percent impaired, limited or restricted    Time: 1539-1615 PT Time Calculation (min): 36 min   Charges:   PT Evaluation $Initial PT Evaluation Tier I: 1 Procedure PT Treatments $Gait Training: 8-22 mins $Self Care/Home Management: 8-22   PT G Codes:   Functional Assessment Tool Used: Clinical judgement/observation Functional Limitation: Mobility: Walking and moving around   [place], [place] 02/25/2014, 5:10 PM

## 2014-02-25 NOTE — Interval H&P Note (Signed)
History and Physical Interval Note:  02/25/2014 8:40 AM  [name] [name]  has presented today for surgery, with the diagnosis of Recurrent large herniated nucleus pulposus L4-5 Central  The various methods of treatment have been discussed with the patient and family. After consideration of risks, benefits and other options for treatment, the patient has consented to  Procedure(s): Bilateral L4-5 MICRODISCECTOMY with MIS (N/A) as a surgical intervention .  The patient's history has been reviewed, patient examined, no change in status, stable for surgery.  I have reviewed the patient's chart and labs.  Questions were answered to the patient's satisfaction.     [name]

## 2014-02-25 NOTE — Transfer of Care (Signed)
Immediate Anesthesia Transfer of Care Note  Patient: [name] [name]  Procedure(s) Performed: Procedure(s): Bilateral L4-5 MICRODISCECTOMY with MIS (N/A)  Patient Location: PACU  Anesthesia Type:General  Level of Consciousness: awake, alert , oriented and sedated  Airway & Oxygen Therapy: Patient Spontanous Breathing and Patient connected to nasal cannula oxygen  Post-op Assessment: Report given to PACU RN, Post -op Vital signs reviewed and stable and Patient moving all extremities  Post vital signs: Reviewed and stable  Complications: No apparent anesthesia complications

## 2014-02-25 NOTE — Anesthesia Procedure Notes (Signed)
Procedure Name: Intubation Date/Time: 02/25/2014 9:04 AM Performed by: Scheryl Darter Pre-anesthesia Checklist: Patient identified, Suction available, Emergency Drugs available, Patient being monitored and Timeout performed Patient Re-evaluated:Patient Re-evaluated prior to inductionOxygen Delivery Method: Circle system utilized Preoxygenation: Pre-oxygenation with 100% oxygen Intubation Type: IV induction Ventilation: Mask ventilation without difficulty Laryngoscope Size: 2 Grade View: Grade I Tube type: Oral Number of attempts: 1 Airway Equipment and Method: Stylet Placement Confirmation: ETT inserted through vocal cords under direct vision,  positive ETCO2 and breath sounds checked- equal and bilateral Secured at: 21 cm Tube secured with: Tape Dental Injury: Teeth and Oropharynx as per pre-operative assessment

## 2014-02-25 NOTE — Brief Op Note (Signed)
02/25/2014  12:10 PM  PATIENT:  [name] [name]  48 y.o. female  PRE-OPERATIVE DIAGNOSIS:  Recurrent large herniated nucleus pulposus L4-5 Central  POST-OPERATIVE DIAGNOSIS:  Recurrent large herniated nucleus pulposus L4-5 Central  PROCEDURE:  Procedure(s): Bilateral L4-5 MICRODISCECTOMY with MIS (N/A)  SURGEON:  Surgeon(s) and Role:    * [name], MD - Primary  PHYSICIAN ASSISTANT:Sheila Lynnae Sandhoff, PA-C   ANESTHESIA:   local and general  EBL:  Total I/O In: 1000 [I.V.:1000] Out: 100 [Blood:100]  BLOOD ADMINISTERED:none  DRAINS: none and Urinary Catheter (Foley)in and out at end of the case.   LOCAL MEDICATIONS USED:  MARCAINE 1/4% 1:1 exparel 1.3%  , Amount: 49ml   SPECIMEN:  No Specimen  DISPOSITION OF SPECIMEN:  N/A  COUNTS:  YES  TOURNIQUET:  * No tourniquets in log *  DICTATION: .Dragon Dictation  PLAN OF CARE: Admit for overnight observation  PATIENT DISPOSITION:  PACU - hemodynamically stable.   Delay start of Pharmacological VTE agent (>24hrs) due to surgical blood loss or risk of bleeding: yes

## 2014-02-26 LAB — BASIC METABOLIC PANEL
BUN: 15 mg/dL (ref 6–23)
CALCIUM: 8.9 mg/dL (ref 8.4–10.5)
CO2: 25 mEq/L (ref 19–32)
Chloride: 104 mEq/L (ref 96–112)
Creatinine, Ser: 0.75 mg/dL (ref 0.50–1.10)
GFR calc Af Amer: >90 mL/min (ref >90)
GFR calc non Af Amer: >90 mL/min (ref >90)
GLUCOSE: 114 mg/dL — AB (ref 70–99)
Potassium: 3.9 mEq/L (ref 3.7–5.3)
SODIUM: 140 meq/L (ref 137–147)

## 2014-02-26 LAB — CBC
HCT: 37.2 % (ref 36.0–46.0)
HEMOGLOBIN: 12.4 g/dL (ref 12.0–15.0)
MCH: 31.9 pg (ref 26.0–34.0)
MCHC: 33.3 g/dL (ref 30.0–36.0)
MCV: 95.6 fL (ref 78.0–100.0)
Platelets: 238 10*3/uL (ref 150–400)
RBC: 3.89 MIL/uL (ref 3.87–5.11)
RDW: 13.7 % (ref 11.5–15.5)
WBC: 14 10*3/uL — ABNORMAL HIGH (ref 4.0–10.5)

## 2014-02-26 MED ORDER — GABAPENTIN 300 MG PO CAPS: 300.0000 mg | ORAL_CAPSULE | Freq: Two times a day (BID) | ORAL | Status: DC

## 2014-02-26 MED ORDER — MELOXICAM 15 MG PO TABS: 15.0000 mg | ORAL_TABLET | Freq: Every day | ORAL | Status: DC

## 2014-02-26 MED ORDER — OXYCODONE-ACETAMINOPHEN 5-325 MG PO TABS: 1.0000 | ORAL_TABLET | ORAL | Status: DC | PRN

## 2014-02-26 MED ORDER — HYDROCORTISONE NA SUCCINATE PF 100 MG IJ SOLR
100.0000 mg | Freq: Two times a day (BID) | INTRAMUSCULAR | Status: AC
  Administered 2014-02-26: 100 mg via INTRAVENOUS
  Filled 2014-02-26 (×2): qty 2

## 2014-02-26 MED ORDER — OXYCODONE HCL ER 20 MG PO T12A: 20.0000 mg | EXTENDED_RELEASE_TABLET | Freq: Two times a day (BID) | ORAL | Status: DC

## 2014-02-26 MED ORDER — METHYLPREDNISOLONE (PAK) 4 MG PO TABS: ORAL_TABLET | ORAL | Status: DC

## 2014-02-26 MED ORDER — CYCLOBENZAPRINE HCL 10 MG PO TABS: 10.0000 mg | ORAL_TABLET | Freq: Three times a day (TID) | ORAL | Status: DC | PRN

## 2014-02-26 NOTE — Care Management Note (Signed)
CARE MANAGEMENT NOTE 02/26/2014  Patient:  [name] [name], [name] [name]   Account Number:  [id number]  Date Initiated:  02/26/2014  Documentation initiated by:  Ricki Miller  Subjective/Objective Assessment:   48 yr old female s/p L4-L5 microdiskectomy.     Action/Plan:   patient states she does not need home health PT. Has had 3 previous surgeries. Husband will assist.   Anticipated DC Date:  02/26/2014   Anticipated DC Plan:  [place]  CM consult      PAC Choice  NA   Choice offered to / List presented to:             Status of service:  Completed, signed off

## 2014-02-26 NOTE — Progress Notes (Signed)
Patient ID: [name] [name], female   DOB: [birth year], 48 y.o.   MRN: [id number] Subjective: 1 Day Post-Op Procedure(s) (LRB): Bilateral L4-5 MICRODISCECTOMY with MIS (N/A) Awake,alert and oriented x 4. Pain markedly improved both legs. Strength improved both legs. Tolerating po meds. Low BP may be due to adrenal insuff or due to decrease pain and increased  Narcotics post op. Oxycontin now 40mg  every 12 hours. Patient reports pain as mild.    Objective:   VITALS:  Temp:  [98 F (36.7 C)-98.6 F (37 C)] 98.6 F (37 C) (06/03 0620) Pulse Rate:  [69-80] 69 (06/03 0647) Resp:  [14-16] 16 (06/03 0620) BP: (91-102)/(45-63) 99/59 mmHg (06/03 0647) SpO2:  [96 %-100 %] 100 % (06/03 0620)  Neurologically intact left leg, right leg still with significant foot DF weakness. ABD soft Sensation intact distally Intact pulses distally Dorsiflexion/Plantar flexion intact left leg, right foot DF 2/5 but improved c/w preop Incision: no drainage No cellulitis present   LABS  Recent Labs  02/26/14 0354  HGB 12.4  WBC 14.0*  PLT 238    Recent Labs  02/26/14 0354  NA 140  K 3.9  CL 104  CO2 25  BUN 15  CREATININE 0.75  GLUCOSE 114*   No results found for this basename: LABPT, INR,  in the last 72 hours   Assessment/Plan: 1 Day Post-Op Procedure(s) (LRB): Bilateral L4-5 MICRODISCECTOMY with MIS (N/A) Give hydrocortisone IV and hold po decadron. Plan medrol dose pack to start this afternoon.  Advance diet Up with therapy D/C IV fluids Discharge home with home health if she does well with PT and foley and IV discontinued.  [name] 02/26/2014, 8:08 AM

## 2014-02-26 NOTE — Op Note (Signed)
02/25/2014  9:25 PM  PATIENT:  [name] [name]  48 y.o. 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    SURGEON:  [name], MD     ASSISTANT:  Phillips Hay, PA-C  (Present throughout the entire procedure and necessary for completion of procedure in a timely manner)     ANESTHESIA:  General,supplemented with local marcaine1/4% 20cc and exparel 1.3% 10cc. Dr. Tobias Alexander.    COMPLICATIONS:  None.  EBL: 100cc     DRAINS:Foley to SD inserted at the conclusion of the case.   PROCEDURE:The patient was met in the holding area, and the appropriate Right Lumbar level L4-5 identified and marked with "x" and my initials.The patient was then transported to OR and was placed under general anesthesia without difficulty. The patient received appropriate preoperative antibiotic prophylaxis clindamycin 900 mg. The patient after intubation atraumatically was transferred to the operating room table, prone position, Wilson frame, sliding OR table. All pressure points were well padded. The arms in 90-90 well-padded at the elbows. Standard prep with duraprep solution lower dorsal spine to the mid sacral segment. Draped in the usual manner clear Vi-Drape was used. Time-out procedure was called and correct. An incision approximately an inch inch and a half in length was then made through skin and subcutaneous layers in line with the expected midline just the old incision scar. Old incision scar resected at the expected L4-5 level. Skin was then infiltrated with Marcaine 1/4 percent with  total of 9 cc used. C-arm was draped sterilely to the field and used to identify placement of a Kocher clamp at the superior portion of the L4 spinous process. This was marked with  a single 0 vicryl suture.  An incision made into the left and right lumbosacral fascia approximately an inch in length at the L4-5 level .The paralumbar muscles and scar incised off the right L4-5 level with bovie and cobb elevator. The right posterior L4-5 interlaminar space indentified. The left L4-5 posterior interlaminar area was then exposed.   Smallest dilator was then introduced into the left incision site and used to carefully form subperiosteal dissection of the paralumbar muscles off of the posterior lamina of the expected left L4-5 level. Successive dilators were then carried up to the 11 mm size. The depth measured off of the dilators at about 60 mm and 60 mm retractors then placed on the scaffolding for the MIS equipment and guided over dilators down to and docking on the posterior aspect of the lamina at the expected L4-5 level. This was sterilely attached to the articulating arm and it's up right which had been attached the OR table sterilely. C-arm fluoroscopy was identified the dilators and the retractors at the appropriate level L4-5. The operating room microscope sterilely draped brought into the field. Under the operating room microscope, the L4-5 interspace carefully debrided the small amount of muscle attachment here and high-speed bur used to drill the medial aspect of the inferior articular process of L4 approximately 20%. A localization lateral C-arm view was obtained with Penfield 4 in the L4-5 facet. 2 mm Kerrison then used to enter the spinal canal over the superior aspect of the L5 lamina carefully using the Kerrison to debris the attachment as a curet. Foraminotomy was then performed over the left L5 nerve root. The  medial 10% superior articular process of L5 and then resected using 2 mm Kerrison. This allowed for identification of the thecal sac. Penfield 4 was then used to carefully mobilize the thecal sac medially and the L5 nerve root identified within the lateral recess flattened  over the posterior aspect of the protruded disc. Carefully the lateral aspect of the L5 nerve root was identified and a Penfield 4 was used to mobilize the nerve medially such that the protruded disc was visible with microscope. Using a Penfield 4 for retraction fragments of disc material were removed using micropituitary rongeurs and nerve hook nerve root and then more easily able to be mobilized medially and retracted using a Derricho retractor. Further foraminotomies was performed over the L5 nerve root the nerve root was noted to be  decompressed. without further compression. The nerve root able to be retracted along the medial aspect of the L5 pedicle and disc material found to be extruded and free at this level was further resected current pituitary rongeurs.  Ligamentum flavum was further debrided superiorly to the level L4-5 disc. Had a moderate amount of further resection of the L4 lamina inferiorly and medially was performed. With this then the disc space at L4-5 was easily visualized and not found to be herniated but extruded disc,  intraoperative lateral radiograph was used to identify the L4-5 disc with the Penfield 4 In place just below the disc space. Micropituitary was used to further debride this material superficially from the posterior aspect of the intervertebral disc is posterior lateral aspect of the disc. Small amount of further disc material was found subligamentous extending laterally from the disc this was removed using micropituitary rongeurs into the left L4 neuroforamen. Ligamentum flavum was debrided and lateral recess along the medial aspect L4-5 facet no further decompression was necessary. Ball tip nerve probe was then able to carefully palpate the neuroforamen for L4 and L5 finding these to be well decompressed. Retractors were then moved to the right L4-5. Under the operating room microscope, the right L4-5 interspace carefully debrided the small amount of muscle attachment here  and high-speed bur used to drill the medial aspect of the inferior articular process of L4 approximately 20%. 2 mm Kerrison then used to enter the spinal canal over the superior aspect of the L5 lamina carefully using the Kerrison to debris the attachment as a curet. Foraminotomy was then performed over the right L5 nerve root. The medial 20% superior articular process of L5 and then resected using 2 mm Kerrison. This allowed for identification of the thecal sac. Penfield 4 was then used to carefully mobilize the thecal sac medially and the L5 nerve root identified within the lateral recess flattened over the posterior aspect of the protruded disc. Carefully the lateral aspect of the L5 nerve root was identified and a Penfield 4 was used to mobilize the nerve medially such that the protruded disc was visible with microscope. Using a Penfield 4 for retraction and a 15 blade scalpel was used to incise the posterior longitudinal ligament within the lateral recess on the right side longitudinally. Disc material was removed using micropituitary rongeurs and nerve hook nerve root and then more easily able to be mobilized medially and retracted using a Derricho retractor. Further foraminotomies was performed over the L5 nerve root the nerve root was noted to be  decompressed. without further compression. The nerve root able to be retracted along the medial aspect of the L5 pedicle and disc material found to be extruded  and free at this level was further resected current pituitary rongeurs.  Ligamentum flavum was further debrided superiorly to the level L4-5 disc. Had a moderate amount of further resection of the L4 lamina inferiorly and mediallywas performed. With this then the disc space at L4-5 was easily visualized and entry into the disc at the right sided disc herniation was possible using a Penfield 4. Micropituitary was used to further debride this material superficially from the posterior aspect of the  intervertebral disc centrally. Small amount of further disc material was found subligamentous extending laterally from the disc this was removed using micropituitary rongeurs into the right L4 neuroforamen, additionally epstein  currettes were used to decompress the right L4 neuroforamen. Upbiting currettes were used to further remove reflected portions of the reflected portions of the medial L4-5 facet and portions of the superior portion of the L5 superior articular. Returning once again to the left side and replacing the MIS retractors the left laminotomy was examined At least 4 more disc fragments were found to be displaced to the left side at the L4-5 disc level. These were excised with a micropituitary ronguer. Irrigation was performed and Examination and palpation of the area ventral to the thecal sac found no further disc fragments. Bleeding was controlled with bipolar electrocautery and thrombin soaked gel foam. The retractors were then returned to the right L4-5 laminotomy site and the gel foam placed removed, inspection the right side laminotomy and the area ventral to the  Thecal sac at the disc level found no further retained disc fragments. The disc tear (annulotomy)was located centrally, it was debrided of any remaining loose disc fragments And no further fragments were present on this 2nd look. Bleed was controlled with bipolar electrocautery and no further gel foam was necessary. Bleeding controlled all thrombin-soaked Gelfoam and small cottonoids were removed. Small amount of bleeding within the soft tissue mass the laminotomy area was controlled using bipolar electrocautery. Irrigation was carried out using copious amounts of irrigant solution. All Gelfoam were then removed on the left side laminotomy site. No significant active bleeding present at the time of removal. All instruments sponge counts were correct traction system was then carefully removed carefully rotating retractors with  this withdrawal and only bipolar electrocautery of any small bleeders. Lumbodorsal fascia was then carefully approximated with interrupted 0 Vicryl sutures to the spinous processes and interspinous ligament, needle deep subcutaneous layers were approximated with interrupted 0 Vicryl sutures on UR 6 the appear subcutaneous layers approximated with interrupted 2-0 Vicryl sutures and the skin closed with a running subcutaneous stitch of 4-0 Vicryl. Dermabond was applied allowed to dry and then Mepilex bandage applied. Patient was then carefully returned to supine position on a stretcher,foley catheter was then place and the patient reactivated and extubated. The patient was then returned to recovery room in satisfactory condition. All instrument and sponge counts were correct. Phillips Hay PA-C perform the duties of assistant surgeon during this case. She was present from the beginning of the case to the end of the case assisting in transfer the patient from his stretcher to the OR table and back to the stretcher at the end of the case. Assisted in careful retraction and suction of the laminectomy site delicate neural structures operating under the operating room microscope. She performed closure of the incision from the fascia to the skin applying the dressing.     [name]  02/26/2014, 9:25 PM

## 2014-02-26 NOTE — Progress Notes (Signed)
Pt feels home health is not needed at this time due to this being 3rd surgery and husband will be at home to help.

## 2014-02-26 NOTE — Anesthesia Postprocedure Evaluation (Signed)
Anesthesia Post Note  Patient: [name] [name]  Procedure(s) Performed: Procedure(s) (LRB): Bilateral L4-5 MICRODISCECTOMY with MIS (N/A)  Anesthesia type: General  Patient location: PACU  Post pain: Pain level controlled and Adequate analgesia  Post assessment: Post-op Vital signs reviewed, Patient's Cardiovascular Status Stable, Respiratory Function Stable, Patent Airway and Pain level controlled  Last Vitals:  Filed Vitals:   02/26/14 0647  BP: 99/59  Pulse: 69  Temp:   Resp:     Post vital signs: Reviewed and stable  Level of consciousness: awake, alert  and oriented  Complications: No apparent anesthesia complications

## 2014-02-26 NOTE — Evaluation (Signed)
Occupational Therapy Evaluation Patient Details Name: [name] [name] MRN: [id number] DOB: Jan 24, 1966 Today's Date: 02/26/2014    History of Present Illness 48 y.o. female s/p L4-5 microdiscectomy. Hx of HTN and adrenal gland hypofunction.   Clinical Impression   Pt admitted with the above diagnoses and presents with below problem list. Pt will benefit from continued acute OT to address the below listed deficits and maximize independence with basic ADLs prior to d/c home with spouse. PTA pt was independent with most ADLs. Reviewed techniques for safe completion of ADLs with back precautions. Pt has AE and DME from previous back surgeries.     Follow Up Recommendations  No OT follow up    Equipment Recommendations  None recommended by OT    Recommendations for Other Services       Precautions / Restrictions Precautions Precautions: Back;Fall Precaution Comments: reviewed precautions and functional impact Restrictions Weight Bearing Restrictions: No      Mobility Bed Mobility               General bed mobility comments: not assessed  Transfers Overall transfer level: Needs assistance Equipment used: Rolling walker (2 wheeled) Transfers: Sit to/from Stand Sit to Stand: Min guard         General transfer comment: Cues keeping rw close during transfers.    Balance Overall balance assessment: Needs assistance;History of Falls Sitting-balance support: No upper extremity supported;Feet supported Sitting balance-Leahy Scale: Good Sitting balance - Comments: sits on toilet without support   Standing balance support: Bilateral upper extremity supported;During functional activity Standing balance-Leahy Scale: Fair Standing balance comment: stood at sink for grooming                            ADL Overall ADL's : Needs assistance/impaired Eating/Feeding: Set up;Sitting   Grooming: Set up;Standing (cueing for back precautions)   Upper Body Bathing:  Min guard;With adaptive equipment   Lower Body Bathing: Min guard;With adaptive equipment;Sit to/from stand   Upper Body Dressing : Sitting;Minimal assistance Upper Body Dressing Details (indicate cue type and reason): assistance for undergarment Lower Body Dressing: Min guard;With adaptive equipment;Sit to/from stand   Toilet Transfer: Min guard;Ambulation   Toileting- Clothing Manipulation and Hygiene: Min guard;Sit to/from stand   Tub/ Shower Transfer: Min guard;Ambulation;3 in 1;Rolling walker   Functional mobility during ADLs: Min guard;Rolling walker General ADL Comments: Pt needed cueing for back precautions during grooming at sink. Pt has AE and was familiar with how to use. Pt has had previous back surgeries as recent as 2013.      Vision                     Perception     Praxis      Pertinent Vitals/Pain Moderate pain. Increased activity during session.     Hand Dominance Right   Extremity/Trunk Assessment Upper Extremity Assessment Upper Extremity Assessment: Overall WFL for tasks assessed   Lower Extremity Assessment Lower Extremity Assessment: Defer to PT evaluation       Communication Communication Communication: No difficulties   Cognition Arousal/Alertness: Awake/alert Behavior During Therapy: WFL for tasks assessed/performed Overall Cognitive Status: Within Functional Limits for tasks assessed                     General Comments       Exercises       Shoulder Instructions      Home Living Family/patient expects  to be discharged to:: Private residence Living Arrangements: Spouse/significant other Available Help at Discharge: Family;Available 24 hours/day Type of Home: House Home Access: Stairs to enter [employer] of Steps: 3 Entrance Stairs-Rails: None Home Layout: Two level;Able to live on main level with bedroom/bathroom           Bathroom Accessibility: Yes How Accessible: Accessible via  walker Home Equipment: [place] - 2 wheels;Bedside commode;Shower seat;Adaptive equipment (reacher, sock aid) Adaptive Equipment: Reacher;Sock aid        Prior Functioning/Environment Level of Independence: Needs assistance  Gait / Transfers Assistance Needed: ambulated without assistive device but had several falls ADL's / Homemaking Assistance Needed: needed assistance with dressing        OT Diagnosis: Acute pain   OT Problem List: Pain;Impaired balance (sitting and/or standing);Decreased knowledge of precautions;Decreased knowledge of use of DME or AE   OT Treatment/Interventions: Self-care/ADL training;Therapeutic exercise;DME and/or AE instruction;Therapeutic activities;Patient/family education;Balance training    OT Goals(Current goals can be found in the care plan section) Acute Rehab OT Goals Patient Stated Goal: not stated OT Goal Formulation: With patient/family Time For Goal Achievement: 03/05/14 Potential to Achieve Goals: Good ADL Goals Pt Will Perform Grooming: with modified independence;standing Pt Will Perform Upper Body Dressing: with supervision;sitting Pt Will Perform Lower Body Dressing: with supervision;with adaptive equipment;sit to/from stand Additional ADL Goal #1: Pt will perform bed mobility at supervision level while pbserving back precautions to prepare for OOB ADLs.  OT Frequency: Min 2X/week   Barriers to D/C:            Co-evaluation              End of Session Equipment Utilized During Treatment: Gait belt;Rolling walker  Activity Tolerance: Patient tolerated treatment well Patient left: in chair;with call bell/phone within reach;with family/visitor present   Time: 2263-3354 OT Time Calculation (min): 19 min Charges:  OT General Charges $OT Visit: 1 Procedure OT Evaluation $Initial OT Evaluation Tier I: 1 Procedure OT Treatments $Self Care/Home Management : 8-22 mins G-Codes: OT G-codes **NOT FOR INPATIENT CLASS** Functional  Assessment Tool Used: clinical judgement Functional Limitation: Self care Self Care Current Status (T6256): At least 1 percent but less than 20 percent impaired, limited or restricted Self Care Goal Status (L8937): At least 1 percent but less than 20 percent impaired, limited or restricted  Hortencia Pilar 02/26/2014, 9:26 AM

## 2014-02-26 NOTE — Progress Notes (Signed)
Physical Therapy Treatment Patient Details Name: [name] [name] MRN: [id number] DOB: [birth year] Today's Date: 02/26/2014    History of Present Illness 48 y.o. female s/p L4-5 microdiscectomy. Hx of HTN and adrenal gland hypofunction.    PT Comments    Patient familiar with precautions and mobility as this is her third surgery. Husband reverablized all precautions and such. Patient is wanting to DC home today. ABle to complete stair training. I recommended she see how her leg was feeling later today, otherwise, feel patient is safe to DC home with assistance from her husband and family  Follow Up Recommendations  Home health PT;Supervision for mobility/OOB     Equipment Recommendations  None recommended by PT    Recommendations for Other Services       Precautions / Restrictions Precautions Precautions: Back;Fall Precaution Comments: reviewed precautions Restrictions Weight Bearing Restrictions: No    Mobility  Bed Mobility               General bed mobility comments: not assessed  Transfers Overall transfer level: Needs assistance Equipment used: Rolling walker (2 wheeled) Transfers: Sit to/from Stand Sit to Stand: Min guard         General transfer comment: cues for hand placement  Ambulation/Gait Ambulation/Gait assistance: Min guard Ambulation Distance (Feet): 250 Feet (one seated rest break) Assistive device: Rolling walker (2 wheeled) Gait Pattern/deviations: Step-to pattern Gait velocity: decreased   General Gait Details: Instability with RLE which increases as she fatigues and muscle spasms. Patient with no LOB but did have to stop and regain step pattern. Cues to relax shoulders and breath.    Stairs Stairs: Yes Stairs assistance: Min assist Stair Management: Step to pattern;Forwards;One rail Right Number of Stairs: 4 General stair comments: One person HHA and rail to simulate cane. Husband assist patient with stairs at home.   Wheelchair  Mobility    Modified [name] (Stroke Patients Only)       Balance Overall balance assessment: Needs assistance;History of Falls Sitting-balance support: No upper extremity supported;Feet supported Sitting balance-Leahy Scale: Good Sitting balance - Comments: sits on toilet without support   Standing balance support: Bilateral upper extremity supported;During functional activity Standing balance-Leahy Scale: Fair Standing balance comment: stood at sink for grooming                    Cognition Arousal/Alertness: Awake/alert Behavior During Therapy: WFL for tasks assessed/performed Overall Cognitive Status: Within Functional Limits for tasks assessed                      Exercises      General Comments        Pertinent Vitals/Pain 8/10 R leg pain. RN provided medication to assist with pain control     Home Living Family/patient expects to be discharged to:: Private residence Living Arrangements: Spouse/significant other Available Help at Discharge: Family;Available 24 hours/day Type of Home: House Home Access: Stairs to enter Entrance Stairs-Rails: None Home Layout: Two level;Able to live on main level with bedroom/bathroom Home Equipment: [employer]ile - 2 wheels;Bedside commode;Shower seat;Adaptive equipment (reacher, sock aid)      Prior Function Level of Independence: Needs assistance  Gait / Transfers Assistance Needed: ambulated without assistive device but had several falls ADL's / Homemaking Assistance Needed: needed assistance with dressing     PT Goals (current goals can now be found in the care plan section) Acute Rehab PT Goals Patient Stated Goal: not stated Progress towards PT goals: Progressing toward  goals    Frequency  Min 5X/week    PT Plan Current plan remains appropriate    Co-evaluation             End of Session Equipment Utilized During Treatment: Gait belt Activity Tolerance: Patient tolerated treatment well Patient  left: in chair;with call bell/phone within reach;with family/visitor present     Time: 3710-6269 PT Time Calculation (min): 25 min  Charges:  $Gait Training: 23-37 mins                    G Codes:      Tonia Brooms Jaquaveon Bilal 02/26/2014, 10:50 AM 02/26/2014 Barrett PTA [phone] pager [phone] office

## 2014-02-28 ENCOUNTER — Encounter (HOSPITAL_COMMUNITY): Payer: Self-pay | Admitting: Specialist

## 2014-04-24 ENCOUNTER — Other Ambulatory Visit: Payer: Self-pay | Admitting: Specialist

## 2014-04-24 DIAGNOSIS — M545 Low back pain: Secondary | ICD-10-CM

## 2014-05-02 ENCOUNTER — Ambulatory Visit
Admission: RE | Admit: 2014-05-02 | Discharge: 2014-05-02 | Disposition: A | Discharge status: Home / Self Care | Payer: BC Managed Care – PPO | Source: Ambulatory Visit | Attending: Specialist | Admitting: Specialist

## 2014-05-02 DIAGNOSIS — M545 Low back pain: Secondary | ICD-10-CM

## 2014-05-02 MED ORDER — GADOBENATE DIMEGLUMINE 529 MG/ML IV SOLN
15.0000 mL | Freq: Once | INTRAVENOUS | Status: AC | PRN
  Administered 2014-05-02: 15 mL via INTRAVENOUS

## 2014-09-17 ENCOUNTER — Encounter (HOSPITAL_COMMUNITY): Payer: Self-pay | Admitting: Pharmacy Technician

## 2014-09-17 ENCOUNTER — Other Ambulatory Visit (HOSPITAL_COMMUNITY): Payer: Self-pay | Admitting: Specialist

## 2014-09-22 ENCOUNTER — Encounter (HOSPITAL_COMMUNITY)
Admission: RE | Admit: 2014-09-22 | Discharge: 2014-09-22 | Disposition: A | Discharge status: Home / Self Care | Payer: BC Managed Care – PPO | Source: Ambulatory Visit | Attending: Specialist | Admitting: Specialist

## 2014-09-22 ENCOUNTER — Encounter (HOSPITAL_COMMUNITY): Payer: Self-pay

## 2014-09-22 ENCOUNTER — Other Ambulatory Visit (HOSPITAL_COMMUNITY): Payer: Self-pay | Admitting: *Deleted

## 2014-09-22 DIAGNOSIS — Z882 Allergy status to sulfonamides status: Secondary | ICD-10-CM | POA: Diagnosis not present

## 2014-09-22 DIAGNOSIS — I10 Essential (primary) hypertension: Secondary | ICD-10-CM | POA: Diagnosis not present

## 2014-09-22 DIAGNOSIS — R011 Cardiac murmur, unspecified: Secondary | ICD-10-CM | POA: Diagnosis not present

## 2014-09-22 DIAGNOSIS — M4806 Spinal stenosis, lumbar region: Secondary | ICD-10-CM | POA: Diagnosis not present

## 2014-09-22 DIAGNOSIS — G43909 Migraine, unspecified, not intractable, without status migrainosus: Secondary | ICD-10-CM | POA: Diagnosis not present

## 2014-09-22 DIAGNOSIS — Z87440 Personal history of urinary (tract) infections: Secondary | ICD-10-CM | POA: Diagnosis not present

## 2014-09-22 DIAGNOSIS — Z880 Allergy status to penicillin: Secondary | ICD-10-CM | POA: Diagnosis not present

## 2014-09-22 DIAGNOSIS — Z888 Allergy status to other drugs, medicaments and biological substances status: Secondary | ICD-10-CM | POA: Diagnosis not present

## 2014-09-22 DIAGNOSIS — E780 Pure hypercholesterolemia: Secondary | ICD-10-CM | POA: Diagnosis not present

## 2014-09-22 DIAGNOSIS — F329 Major depressive disorder, single episode, unspecified: Secondary | ICD-10-CM | POA: Diagnosis not present

## 2014-09-22 DIAGNOSIS — Z881 Allergy status to other antibiotic agents status: Secondary | ICD-10-CM | POA: Diagnosis not present

## 2014-09-22 DIAGNOSIS — M5126 Other intervertebral disc displacement, lumbar region: Secondary | ICD-10-CM | POA: Diagnosis present

## 2014-09-22 DIAGNOSIS — Z85820 Personal history of malignant melanoma of skin: Secondary | ICD-10-CM | POA: Diagnosis not present

## 2014-09-22 DIAGNOSIS — Z8613 Personal history of malaria: Secondary | ICD-10-CM | POA: Diagnosis not present

## 2014-09-22 HISTORY — DX: Family history of other specified conditions: Z84.89

## 2014-09-22 HISTORY — DX: Personal history of urinary (tract) infections: Z87.440

## 2014-09-22 LAB — COMPREHENSIVE METABOLIC PANEL
ALT: 18 U/L (ref 0–35)
AST: 21 U/L (ref 0–37)
Albumin: 3.9 g/dL (ref 3.5–5.2)
Alkaline Phosphatase: 61 U/L (ref 39–117)
Anion gap: 5 (ref 5–15)
BUN: 9 mg/dL (ref 6–23)
CO2: 31 mmol/L (ref 19–32)
Calcium: 9.2 mg/dL (ref 8.4–10.5)
Chloride: 101 mEq/L (ref 96–112)
Creatinine, Ser: 0.75 mg/dL (ref 0.50–1.10)
GFR calc Af Amer: >90 mL/min (ref >90)
Glucose, Bld: 97 mg/dL (ref 70–99)
Potassium: 2.9 mmol/L — ABNORMAL LOW (ref 3.5–5.1)
SODIUM: 137 mmol/L (ref 135–145)
Total Bilirubin: 0.6 mg/dL (ref 0.3–1.2)
Total Protein: 6.7 g/dL (ref 6.0–8.3)

## 2014-09-22 LAB — CBC
HCT: 42.4 % (ref 36.0–46.0)
Hemoglobin: 14.3 g/dL (ref 12.0–15.0)
MCH: 31.9 pg (ref 26.0–34.0)
MCHC: 33.7 g/dL (ref 30.0–36.0)
MCV: 94.6 fL (ref 78.0–100.0)
PLATELETS: 260 10*3/uL (ref 150–400)
RBC: 4.48 MIL/uL (ref 3.87–5.11)
RDW: 13.6 % (ref 11.5–15.5)
WBC: 5.8 10*3/uL (ref 4.0–10.5)

## 2014-09-22 LAB — SURGICAL PCR SCREEN
MRSA, PCR: NEGATIVE
STAPHYLOCOCCUS AUREUS: NEGATIVE

## 2014-09-22 MED ORDER — VANCOMYCIN HCL IN DEXTROSE 1-5 GM/200ML-% IV SOLN
1000.0000 mg | INTRAVENOUS | Status: AC
Start: 2014-09-23 — End: 2014-09-23
  Administered 2014-09-23: 1000 mg via INTRAVENOUS
  Filled 2014-09-22: qty 200

## 2014-09-22 NOTE — Pre-Procedure Instructions (Signed)
[name] [name]  09/22/2014   Your procedure is scheduled on:  Tuesday, September 23, 2014 at 1:45 PM.   Report to [facility] Entrance "A" Admitting Office at 11:45 AM.   Call this number if you have problems the morning of surgery: [phone]   Remember:   Do not eat food or drink liquids after midnight tonight.   Take these medicines the morning of surgery with A SIP OF WATER: buPROPion (WELLBUTRIN SR), escitalopram (LEXAPRO), gabapentin (NEURONTIN),  oxyCODONE (OXYCONTIN), topiramate (TOPAMAX), mometasone (NASONEX)   Do not wear jewelry, make-up or nail polish.  Do not wear lotions, powders, or perfumes. You may wear deodorant.  Do not shave 48 hours prior to surgery.   Do not bring valuables to the hospital.  [facility] is not responsible                  for any belongings or valuables.               Contacts, dentures or bridgework may not be worn into surgery.  Leave suitcase in the car. After surgery it may be brought to your room.  For patients admitted to the hospital, discharge time is determined by your                treatment team.              Special Instructions: [place] - Preparing for Surgery  Before surgery, you can play an important role.  Because skin is not sterile, your skin needs to be as free of germs as possible.  You can reduce the number of germs on you skin by washing with CHG (chlorahexidine gluconate) soap before surgery.  CHG is an antiseptic cleaner which kills germs and bonds with the skin to continue killing germs even after washing.  Please DO NOT use if you have an allergy to CHG or antibacterial soaps.  If your skin becomes reddened/irritated stop using the CHG and inform your nurse when you arrive at Short Stay.  Do not shave (including legs and underarms) for at least 48 hours prior to the first CHG shower.  You may shave your face.  Please follow these instructions carefully:   1.  Shower with CHG Soap the night before  surgery and the                                morning of Surgery.  2.  If you choose to wash your hair, wash your hair first as usual with your       normal shampoo.  3.  After you shampoo, rinse your hair and body thoroughly to remove the                      Shampoo.  4.  Use CHG as you would any other liquid soap.  You can apply chg directly       to the skin and wash gently with scrungie or a clean washcloth.  5.  Apply the CHG Soap to your body ONLY FROM THE NECK DOWN.        Do not use on open wounds or open sores.  Avoid contact with your eyes, ears, mouth and genitals (private parts).  Wash genitals (private parts) with your normal soap.  6.  Wash thoroughly, paying special attention to the area where your surgery  will be performed.  7.  Thoroughly rinse your body with warm water from the neck down.  8.  DO NOT shower/wash with your normal soap after using and rinsing off       the CHG Soap.  9.  Pat yourself dry with a clean towel.            10.  Wear clean pajamas.            11.  Place clean sheets on your bed the night of your first shower and do not        sleep with pets.  Day of Surgery  Do not apply any lotions the morning of surgery.  Please wear clean clothes to the hospital.     Please read over the following fact sheets that you were given: Pain Booklet, Coughing and Deep Breathing, MRSA Information and Surgical Site Infection Prevention

## 2014-09-22 NOTE — Progress Notes (Signed)
Anesthesia Chart Review:  Patient is a 48 year old female scheduled for right L4-5 and L5-S1 microdiscectomies tomorrow by Dr. Louanne Skye.  PAT was earlier today.  She is s/p three prior lumbar microdiscectomy surgeries since 2013--last 02/2014.    Other history includes non-smoker, post-operatively N/V, HTN, migraines, hypercholesterolemia, melanoma excision, murmur (not specified), adrenal gland hypofunction (low cortisol following 02/2012 surgery with recent oral and epidural steroids and development of itchy rash with vancomycin), Malaria '99, questionable post-operative DVT after ankle surgery (treated) ~ 2010, hysterectomy. PCP is listed as Dr. Dagmar Hait.  EKG on 02/25/14: NSR, RAE.  CXR 02/25/14 (done prior to her 02/25/14 surgery with Dr. Louanne Skye) showed: COPD. Questionable nodular density in the left apex. Nonemergent CT of the chest without contrast would be helpful for further evaluation.  Preoperative labs noted. Na 137, K 2.9. Cr 0.75. CBC WNL.  Glucose 97.  (She is on HCTZ which could be contributing to her hypokalemia; she required KCL supplements back in 02/2014.)  It was unclear to me if her abnormal CXR was addressed during her 02/2014 hospitalization.  There is no chest CT report in Epic.  Subsequently, I called her 02/25/14 CXR report and today's K+ to [place] at Dr. Otho Ket office to be further addressed by Dr. Louanne Skye and/or his PA.  I will order an ISTAT4 on arrival to re-evaluate for hypokalemia.  Would like her potassium at least 3.0 preoperatively.  George Hugh [facility] Short Stay Center/Anesthesiology Phone [phone] 09/22/2014 2:12 PM

## 2014-09-23 ENCOUNTER — Ambulatory Visit (HOSPITAL_COMMUNITY): Payer: BC Managed Care – PPO

## 2014-09-23 ENCOUNTER — Encounter (HOSPITAL_COMMUNITY)
Admission: RE | Disposition: A | Discharge status: Home / Self Care | Payer: Self-pay | Source: Ambulatory Visit | Attending: Specialist

## 2014-09-23 ENCOUNTER — Observation Stay (HOSPITAL_COMMUNITY)
Admission: RE | Admit: 2014-09-23 | Discharge: 2014-09-24 | Disposition: A | Discharge status: Home / Self Care | Payer: BC Managed Care – PPO | Source: Ambulatory Visit | Attending: Specialist | Admitting: Specialist

## 2014-09-23 ENCOUNTER — Encounter (HOSPITAL_COMMUNITY): Payer: Self-pay | Admitting: Certified Registered Nurse Anesthetist

## 2014-09-23 ENCOUNTER — Ambulatory Visit (HOSPITAL_COMMUNITY): Payer: BC Managed Care – PPO | Admitting: Vascular Surgery

## 2014-09-23 ENCOUNTER — Ambulatory Visit (HOSPITAL_COMMUNITY): Payer: BC Managed Care – PPO | Admitting: Certified Registered Nurse Anesthetist

## 2014-09-23 DIAGNOSIS — Z9889 Other specified postprocedural states: Secondary | ICD-10-CM

## 2014-09-23 DIAGNOSIS — M4806 Spinal stenosis, lumbar region: Secondary | ICD-10-CM | POA: Insufficient documentation

## 2014-09-23 DIAGNOSIS — Z888 Allergy status to other drugs, medicaments and biological substances status: Secondary | ICD-10-CM | POA: Insufficient documentation

## 2014-09-23 DIAGNOSIS — Z881 Allergy status to other antibiotic agents status: Secondary | ICD-10-CM | POA: Insufficient documentation

## 2014-09-23 DIAGNOSIS — M5126 Other intervertebral disc displacement, lumbar region: Principal | ICD-10-CM | POA: Diagnosis present

## 2014-09-23 DIAGNOSIS — Z880 Allergy status to penicillin: Secondary | ICD-10-CM | POA: Insufficient documentation

## 2014-09-23 DIAGNOSIS — Z882 Allergy status to sulfonamides status: Secondary | ICD-10-CM | POA: Insufficient documentation

## 2014-09-23 DIAGNOSIS — Z01818 Encounter for other preprocedural examination: Secondary | ICD-10-CM

## 2014-09-23 DIAGNOSIS — Z8613 Personal history of malaria: Secondary | ICD-10-CM | POA: Insufficient documentation

## 2014-09-23 DIAGNOSIS — M48062 Spinal stenosis, lumbar region with neurogenic claudication: Secondary | ICD-10-CM | POA: Diagnosis present

## 2014-09-23 DIAGNOSIS — F329 Major depressive disorder, single episode, unspecified: Secondary | ICD-10-CM | POA: Insufficient documentation

## 2014-09-23 DIAGNOSIS — E780 Pure hypercholesterolemia: Secondary | ICD-10-CM | POA: Insufficient documentation

## 2014-09-23 DIAGNOSIS — Z85820 Personal history of malignant melanoma of skin: Secondary | ICD-10-CM | POA: Insufficient documentation

## 2014-09-23 DIAGNOSIS — Z419 Encounter for procedure for purposes other than remedying health state, unspecified: Secondary | ICD-10-CM

## 2014-09-23 DIAGNOSIS — Z87440 Personal history of urinary (tract) infections: Secondary | ICD-10-CM | POA: Insufficient documentation

## 2014-09-23 DIAGNOSIS — I10 Essential (primary) hypertension: Secondary | ICD-10-CM | POA: Insufficient documentation

## 2014-09-23 DIAGNOSIS — R011 Cardiac murmur, unspecified: Secondary | ICD-10-CM | POA: Insufficient documentation

## 2014-09-23 DIAGNOSIS — G43909 Migraine, unspecified, not intractable, without status migrainosus: Secondary | ICD-10-CM | POA: Insufficient documentation

## 2014-09-23 HISTORY — PX: LUMBAR LAMINECTOMY: SHX95

## 2014-09-23 LAB — POCT I-STAT 4, (NA,K, GLUC, HGB,HCT)
GLUCOSE: 87 mg/dL (ref 70–99)
HEMATOCRIT: 43 % (ref 36.0–46.0)
HEMOGLOBIN: 14.6 g/dL (ref 12.0–15.0)
Potassium: 4.7 mmol/L (ref 3.5–5.1)
SODIUM: 139 mmol/L (ref 135–145)

## 2014-09-23 SURGERY — MICRODISCECTOMY LUMBAR LAMINECTOMY
Anesthesia: General | Site: Back

## 2014-09-23 MED ORDER — ONDANSETRON HCL 4 MG/2ML IJ SOLN
INTRAMUSCULAR | Status: DC | PRN
  Administered 2014-09-23: 4 mg via INTRAVENOUS

## 2014-09-23 MED ORDER — METOCLOPRAMIDE HCL 10 MG PO TABS: 5.0000 mg | ORAL_TABLET | Freq: Three times a day (TID) | ORAL | Status: DC | PRN

## 2014-09-23 MED ORDER — BUPROPION HCL ER (SR) 150 MG PO TB12: 150.0000 mg | ORAL_TABLET | Freq: Every day | ORAL | Status: DC

## 2014-09-23 MED ORDER — HYDROMORPHONE HCL 1 MG/ML IJ SOLN
INTRAMUSCULAR | Status: AC
  Filled 2014-09-23: qty 1

## 2014-09-23 MED ORDER — ONDANSETRON HCL 4 MG PO TABS: 4.0000 mg | ORAL_TABLET | Freq: Four times a day (QID) | ORAL | Status: DC | PRN

## 2014-09-23 MED ORDER — GABAPENTIN 300 MG PO CAPS
300.0000 mg | ORAL_CAPSULE | Freq: Two times a day (BID) | ORAL | Status: DC
  Filled 2014-09-23: qty 1

## 2014-09-23 MED ORDER — MIDAZOLAM HCL 2 MG/2ML IJ SOLN
INTRAMUSCULAR | Status: AC
  Filled 2014-09-23: qty 2

## 2014-09-23 MED ORDER — KETAMINE HCL 100 MG/ML IJ SOLN
INTRAMUSCULAR | Status: AC
  Filled 2014-09-23: qty 1

## 2014-09-23 MED ORDER — OXYCODONE HCL 5 MG/5ML PO SOLN: 5.0000 mg | Freq: Once | ORAL | Status: AC | PRN

## 2014-09-23 MED ORDER — METOCLOPRAMIDE HCL 5 MG/ML IJ SOLN: 5.0000 mg | Freq: Three times a day (TID) | INTRAMUSCULAR | Status: DC | PRN

## 2014-09-23 MED ORDER — SENNOSIDES-DOCUSATE SODIUM 8.6-50 MG PO TABS: 1.0000 | ORAL_TABLET | Freq: Every evening | ORAL | Status: DC | PRN

## 2014-09-23 MED ORDER — HYDROCORTISONE NA SUCCINATE PF 100 MG IJ SOLR
INTRAMUSCULAR | Status: AC
  Filled 2014-09-23: qty 2

## 2014-09-23 MED ORDER — LACTATED RINGERS IV SOLN
INTRAVENOUS | Status: DC | PRN
  Administered 2014-09-23 (×3): via INTRAVENOUS

## 2014-09-23 MED ORDER — ONDANSETRON HCL 4 MG/2ML IJ SOLN
INTRAMUSCULAR | Status: AC
  Filled 2014-09-23: qty 2

## 2014-09-23 MED ORDER — THROMBIN 20000 UNITS EX SOLR
CUTANEOUS | Status: AC
  Filled 2014-09-23: qty 20000

## 2014-09-23 MED ORDER — ROCURONIUM BROMIDE 100 MG/10ML IV SOLN
INTRAVENOUS | Status: DC | PRN
  Administered 2014-09-23: 50 mg via INTRAVENOUS

## 2014-09-23 MED ORDER — LIDOCAINE HCL (CARDIAC) 20 MG/ML IV SOLN
INTRAVENOUS | Status: DC | PRN
  Administered 2014-09-23: 60 mg via INTRAVENOUS

## 2014-09-23 MED ORDER — ROSUVASTATIN CALCIUM 20 MG PO TABS
20.0000 mg | ORAL_TABLET | Freq: Every day | ORAL | Status: DC
  Filled 2014-09-23: qty 1

## 2014-09-23 MED ORDER — EPHEDRINE SULFATE 50 MG/ML IJ SOLN
INTRAMUSCULAR | Status: AC
  Filled 2014-09-23: qty 1

## 2014-09-23 MED ORDER — BUPIVACAINE LIPOSOME 1.3 % IJ SUSP
20.0000 mL | INTRAMUSCULAR | Status: AC
  Administered 2014-09-23: 20 mL
  Filled 2014-09-23: qty 20

## 2014-09-23 MED ORDER — CYCLOBENZAPRINE HCL 5 MG PO TABS: 5.0000 mg | ORAL_TABLET | Freq: Every day | ORAL | Status: DC

## 2014-09-23 MED ORDER — CHLORHEXIDINE GLUCONATE 4 % EX LIQD
60.0000 mL | Freq: Once | CUTANEOUS | Status: DC
  Filled 2014-09-23: qty 60

## 2014-09-23 MED ORDER — ONDANSETRON HCL 4 MG/2ML IJ SOLN: 4.0000 mg | Freq: Four times a day (QID) | INTRAMUSCULAR | Status: DC | PRN

## 2014-09-23 MED ORDER — FENTANYL CITRATE 0.05 MG/ML IJ SOLN
INTRAMUSCULAR | Status: AC
  Filled 2014-09-23: qty 5

## 2014-09-23 MED ORDER — BUPIVACAINE HCL 0.5 % IJ SOLN
INTRAMUSCULAR | Status: DC | PRN
  Administered 2014-09-23: 20 mL

## 2014-09-23 MED ORDER — OXYCODONE HCL 20 MG PO TB12: 20.0000 mg | ORAL_TABLET | Freq: Two times a day (BID) | ORAL | Status: DC

## 2014-09-23 MED ORDER — OXYCODONE-ACETAMINOPHEN 5-325 MG PO TABS: 1.0000 | ORAL_TABLET | Freq: Four times a day (QID) | ORAL | Status: DC | PRN

## 2014-09-23 MED ORDER — GLYCOPYRROLATE 0.2 MG/ML IJ SOLN
INTRAMUSCULAR | Status: DC | PRN
  Administered 2014-09-23: 0.6 mg via INTRAVENOUS

## 2014-09-23 MED ORDER — PROPOFOL 10 MG/ML IV BOLUS
INTRAVENOUS | Status: AC
  Filled 2014-09-23: qty 20

## 2014-09-23 MED ORDER — METHOCARBAMOL 500 MG PO TABS
500.0000 mg | ORAL_TABLET | Freq: Four times a day (QID) | ORAL | Status: DC | PRN
  Administered 2014-09-23 – 2014-09-24 (×3): 500 mg via ORAL
  Filled 2014-09-23 (×3): qty 1

## 2014-09-23 MED ORDER — FENTANYL CITRATE 0.05 MG/ML IJ SOLN
50.0000 ug | INTRAMUSCULAR | Status: DC | PRN
  Administered 2014-09-23: 100 ug via INTRAVENOUS

## 2014-09-23 MED ORDER — MIDAZOLAM HCL 5 MG/5ML IJ SOLN
INTRAMUSCULAR | Status: DC | PRN
  Administered 2014-09-23: 2 mg via INTRAVENOUS

## 2014-09-23 MED ORDER — ROCURONIUM BROMIDE 50 MG/5ML IV SOLN
INTRAVENOUS | Status: AC
  Filled 2014-09-23: qty 1

## 2014-09-23 MED ORDER — PROPOFOL 10 MG/ML IV BOLUS
INTRAVENOUS | Status: DC | PRN
  Administered 2014-09-23: 200 mg via INTRAVENOUS
  Administered 2014-09-23: 20 mg via INTRAVENOUS

## 2014-09-23 MED ORDER — NEOSTIGMINE METHYLSULFATE 10 MG/10ML IV SOLN
INTRAVENOUS | Status: DC | PRN
  Administered 2014-09-23: 4 mg via INTRAVENOUS

## 2014-09-23 MED ORDER — FLUTICASONE PROPIONATE 50 MCG/ACT NA SUSP: 1.0000 | Freq: Every day | NASAL | Status: DC

## 2014-09-23 MED ORDER — VECURONIUM BROMIDE 10 MG IV SOLR
INTRAVENOUS | Status: DC | PRN
  Administered 2014-09-23: 1 mg via INTRAVENOUS

## 2014-09-23 MED ORDER — HYDROMORPHONE HCL 1 MG/ML IJ SOLN
0.2500 mg | INTRAMUSCULAR | Status: DC | PRN
  Administered 2014-09-23 (×4): 0.25 mg via INTRAVENOUS

## 2014-09-23 MED ORDER — NEOSTIGMINE METHYLSULFATE 10 MG/10ML IV SOLN
INTRAVENOUS | Status: AC
  Filled 2014-09-23: qty 1

## 2014-09-23 MED ORDER — DOCUSATE SODIUM 100 MG PO CAPS
100.0000 mg | ORAL_CAPSULE | Freq: Two times a day (BID) | ORAL | Status: DC
  Administered 2014-09-23 – 2014-09-24 (×2): 100 mg via ORAL
  Filled 2014-09-23 (×2): qty 1

## 2014-09-23 MED ORDER — 0.9 % SODIUM CHLORIDE (POUR BTL) OPTIME
TOPICAL | Status: DC | PRN
  Administered 2014-09-23: 1000 mL

## 2014-09-23 MED ORDER — TOPIRAMATE 25 MG PO TABS
150.0000 mg | ORAL_TABLET | Freq: Every day | ORAL | Status: DC
  Administered 2014-09-24: 150 mg via ORAL
  Filled 2014-09-23: qty 2

## 2014-09-23 MED ORDER — EPHEDRINE SULFATE 50 MG/ML IJ SOLN
INTRAMUSCULAR | Status: DC | PRN
  Administered 2014-09-23 (×3): 5 mg via INTRAVENOUS
  Administered 2014-09-23: 10 mg via INTRAVENOUS

## 2014-09-23 MED ORDER — LIDOCAINE HCL (CARDIAC) 20 MG/ML IV SOLN
INTRAVENOUS | Status: AC
  Filled 2014-09-23: qty 5

## 2014-09-23 MED ORDER — OXYCODONE-ACETAMINOPHEN 5-325 MG PO TABS
1.0000 | ORAL_TABLET | ORAL | Status: DC | PRN
  Administered 2014-09-23 – 2014-09-24 (×4): 2 via ORAL
  Filled 2014-09-23 (×4): qty 2

## 2014-09-23 MED ORDER — POTASSIUM CHLORIDE IN NACL 20-0.45 MEQ/L-% IV SOLN
INTRAVENOUS | Status: DC
  Administered 2014-09-23: 22:00:00 via INTRAVENOUS
  Filled 2014-09-23 (×4): qty 1000

## 2014-09-23 MED ORDER — LACTATED RINGERS IV SOLN
INTRAVENOUS | Status: DC
  Administered 2014-09-23: 12:00:00 via INTRAVENOUS

## 2014-09-23 MED ORDER — ESCITALOPRAM OXALATE 20 MG PO TABS
20.0000 mg | ORAL_TABLET | Freq: Every day | ORAL | Status: DC
  Administered 2014-09-24: 20 mg via ORAL
  Filled 2014-09-23 (×2): qty 1

## 2014-09-23 MED ORDER — GLYCOPYRROLATE 0.2 MG/ML IJ SOLN
INTRAMUSCULAR | Status: AC
  Filled 2014-09-23: qty 3

## 2014-09-23 MED ORDER — FENTANYL CITRATE 0.05 MG/ML IJ SOLN
INTRAMUSCULAR | Status: AC
  Administered 2014-09-23: 100 ug via INTRAVENOUS
  Filled 2014-09-23: qty 2

## 2014-09-23 MED ORDER — THROMBIN 20000 UNITS EX KIT
PACK | CUTANEOUS | Status: DC | PRN
  Administered 2014-09-23: 20 mL via TOPICAL

## 2014-09-23 MED ORDER — HYDROCORTISONE NA SUCCINATE PF 100 MG IJ SOLR
INTRAMUSCULAR | Status: DC | PRN
  Administered 2014-09-23: 100 mg via INTRAVENOUS

## 2014-09-23 MED ORDER — SODIUM CHLORIDE 0.9 % IV SOLN
500.0000 mg | INTRAVENOUS | Status: DC | PRN
  Administered 2014-09-23: 10 ug/kg/min via INTRAVENOUS

## 2014-09-23 MED ORDER — OXYCODONE HCL 5 MG PO TABS
5.0000 mg | ORAL_TABLET | Freq: Once | ORAL | Status: AC | PRN
Start: 2014-09-23 — End: 2014-09-23
  Administered 2014-09-23: 5 mg via ORAL

## 2014-09-23 MED ORDER — OXYCODONE HCL 5 MG PO TABS
ORAL_TABLET | ORAL | Status: AC
  Filled 2014-09-23: qty 1

## 2014-09-23 MED ORDER — HYDROCHLOROTHIAZIDE 25 MG PO TABS
25.0000 mg | ORAL_TABLET | Freq: Every day | ORAL | Status: DC
  Filled 2014-09-23: qty 1

## 2014-09-23 MED ORDER — HYDROMORPHONE HCL 1 MG/ML IJ SOLN: 0.5000 mg | INTRAMUSCULAR | Status: DC | PRN

## 2014-09-23 MED ORDER — FENTANYL CITRATE 0.05 MG/ML IJ SOLN
INTRAMUSCULAR | Status: DC | PRN
  Administered 2014-09-23: 50 ug via INTRAVENOUS
  Administered 2014-09-23: 200 ug via INTRAVENOUS

## 2014-09-23 MED ORDER — HYDROCODONE-ACETAMINOPHEN 5-325 MG PO TABS
1.0000 | ORAL_TABLET | ORAL | Status: DC | PRN
  Administered 2014-09-23 – 2014-09-24 (×3): 2 via ORAL
  Filled 2014-09-23 (×3): qty 2

## 2014-09-23 SURGICAL SUPPLY — 53 items
BUR MATCHSTICK NEURO 3.0 LAGG (BURR) IMPLANT
BUR ROUND FLUTED 4 SOFT TCH (BURR) ×2 IMPLANT
CANISTER SUCTION 2500CC (MISCELLANEOUS) ×2 IMPLANT
CORDS BIPOLAR (ELECTRODE) ×2 IMPLANT
COVER MAYO STAND STRL (DRAPES) ×4 IMPLANT
COVER SURGICAL LIGHT HANDLE (MISCELLANEOUS) ×2 IMPLANT
DERMABOND ADVANCED (GAUZE/BANDAGES/DRESSINGS) ×1
DERMABOND ADVANCED .7 DNX12 (GAUZE/BANDAGES/DRESSINGS) ×1 IMPLANT
DRAPE C-ARM 42X72 X-RAY (DRAPES) ×2 IMPLANT
DRAPE MICROSCOPE LEICA (MISCELLANEOUS) ×2 IMPLANT
DRAPE PROXIMA HALF (DRAPES) IMPLANT
DRAPE SURG 17X23 STRL (DRAPES) ×8 IMPLANT
DRSG MEPILEX BORDER 4X4 (GAUZE/BANDAGES/DRESSINGS) ×2 IMPLANT
DRSG MEPILEX BORDER 4X8 (GAUZE/BANDAGES/DRESSINGS) IMPLANT
DURAPREP 26ML APPLICATOR (WOUND CARE) ×2 IMPLANT
ELECT BLADE 4.0 EZ CLEAN MEGAD (MISCELLANEOUS) ×2
ELECT CAUTERY BLADE 6.4 (BLADE) ×2 IMPLANT
ELECT REM PT RETURN 9FT ADLT (ELECTROSURGICAL) ×2
ELECTRODE BLDE 4.0 EZ CLN MEGD (MISCELLANEOUS) ×1 IMPLANT
ELECTRODE REM PT RTRN 9FT ADLT (ELECTROSURGICAL) ×1 IMPLANT
GLOVE BIOGEL PI IND STRL 7.0 (GLOVE) ×1 IMPLANT
GLOVE BIOGEL PI IND STRL 8 (GLOVE) ×1 IMPLANT
GLOVE BIOGEL PI INDICATOR 7.0 (GLOVE) ×1
GLOVE BIOGEL PI INDICATOR 8 (GLOVE) ×1
GLOVE ECLIPSE 8.5 STRL (GLOVE) ×2 IMPLANT
GLOVE ORTHO TXT STRL SZ7.5 (GLOVE) ×2 IMPLANT
GLOVE SURG 8.5 LATEX PF (GLOVE) ×2 IMPLANT
GOWN STRL REUS W/ TWL LRG LVL3 (GOWN DISPOSABLE) ×2 IMPLANT
GOWN STRL REUS W/TWL 2XL LVL3 (GOWN DISPOSABLE) ×4 IMPLANT
GOWN STRL REUS W/TWL LRG LVL3 (GOWN DISPOSABLE) ×2
KIT BASIN OR (CUSTOM PROCEDURE TRAY) ×2 IMPLANT
KIT ROOM TURNOVER OR (KITS) ×2 IMPLANT
LIQUID BAND (GAUZE/BANDAGES/DRESSINGS) ×2 IMPLANT
NEEDLE SPNL 18GX3.5 QUINCKE PK (NEEDLE) ×4 IMPLANT
NS IRRIG 1000ML POUR BTL (IV SOLUTION) ×2 IMPLANT
PACK LAMINECTOMY ORTHO (CUSTOM PROCEDURE TRAY) ×2 IMPLANT
PAD ARMBOARD 7.5X6 YLW CONV (MISCELLANEOUS) ×4 IMPLANT
PATTIES SURGICAL .5 X.5 (GAUZE/BANDAGES/DRESSINGS) IMPLANT
PATTIES SURGICAL .75X.75 (GAUZE/BANDAGES/DRESSINGS) IMPLANT
SPONGE LAP 4X18 X RAY DECT (DISPOSABLE) IMPLANT
SPONGE SURGIFOAM ABS GEL 100 (HEMOSTASIS) ×2 IMPLANT
SUT VIC AB 1 CT1 27 (SUTURE) ×1
SUT VIC AB 1 CT1 27XBRD ANBCTR (SUTURE) ×1 IMPLANT
SUT VIC AB 2-0 CT1 27 (SUTURE)
SUT VIC AB 2-0 CT1 TAPERPNT 27 (SUTURE) IMPLANT
SUT VICRYL 0 UR6 27IN ABS (SUTURE) ×2 IMPLANT
SUT VICRYL 4-0 PS2 18IN ABS (SUTURE) ×2 IMPLANT
SYR 20CC LL (SYRINGE) ×2 IMPLANT
SYR CONTROL 10ML LL (SYRINGE) ×2 IMPLANT
TOWEL OR 17X24 6PK STRL BLUE (TOWEL DISPOSABLE) ×2 IMPLANT
TOWEL OR 17X26 10 PK STRL BLUE (TOWEL DISPOSABLE) ×2 IMPLANT
TRAY FOLEY CATH 16FRSI W/METER (SET/KITS/TRAYS/PACK) ×2 IMPLANT
WATER STERILE IRR 1000ML POUR (IV SOLUTION) IMPLANT

## 2014-09-23 NOTE — Interval H&P Note (Signed)
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 After consideration of risks, benefits and other options for treatment, the patient has consented to  Procedure(s): RIGHT L4-5 AND L5-S1 MICRODISCECTOMIES (N/A) as a surgical intervention .  The patient's history has been reviewed, patient examined, no change in status, stable for surgery.  I have reviewed the patient's chart and labs.  Questions were answered to the patient's satisfaction.     Abie Killian E

## 2014-09-23 NOTE — Brief Op Note (Signed)
09/23/2014  7:22 PM  PATIENT:  [name] [name]  48 y.o. 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  POST-OPERATIVE DIAGNOSIS:  Recurrent right L4-5, and primary right L5-S1, Herniatied nucleus  PROCEDURE:  Procedure(s): RIGHT L4-5 AND L5-S1 MICRODISCECTOMIES (N/A)  SURGEON:  Surgeon(s) and Role: [name], MD - Primary  PHYSICIAN ASSISTANT:Sisto Granillo Ricard Dillon, PA-C   ANESTHESIA:   local and general  EBL: 50cc    BLOOD ADMINISTERED:none  DRAINS: Urinary Catheter (Foley)   LOCAL MEDICATIONS USED:  MARCAINE1/2% 1:1 EXPAREL 1.3%, Amount: 40 ml   SPECIMEN:  No Specimen  DISPOSITION OF SPECIMEN:  N/A  COUNTS:  YES  TOURNIQUET:  * No tourniquets in log *  DICTATION: .Dragon Dictation  PLAN OF CARE: Admit for overnight observation  PATIENT DISPOSITION:  PACU - hemodynamically stable.   Delay start of Pharmacological VTE agent (>24hrs) due to surgical blood loss or risk of bleeding: yes

## 2014-09-23 NOTE — Op Note (Signed)
09/23/2014  7:25 PM  PATIENT:  [name] [name]  48 y.o. 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  PROCEDURE:  Procedure(s): RIGHT L4-5 AND L5-S1 MICRODISCECTOMIES    SURGEON:  [name], MD     ASSISTANT:  Benjiman Core, PA-C  (Present throughout the entire procedure and necessary for completion of procedure in a timely manner)     ANESTHESIA:  General,supplemented with local marcaine 1/2% 1:1 exparel 1.3% total 40 cc,  Dr.Moser.    COMPLICATIONS:  None.   FINDINGS: Small recurrent HNP right L4-5, right lateral recess stenosis L4-5 with L5 nerve entrapment and right L4 foramenal entrapment due to hypertrophic reflected ligmentum flavum. Right  L5-S1 Small HNP with right S1 impingement within the right L5-S1 lateral recess.     DRAINS: Foley to SD.  PROCEDURE:The patient was met in the holding area, and the appropriate Right Lumbar level L5-S1 and L4-5 identified and marked with "x" and my initials.The patient was then transported to OR and was placed under general anesthesia without difficulty. The patient received appropriate preoperative antibiotic prophylaxis vancomycin. Foley catheter was placed sterilely. The patient after intubation atraumatically was transferred to the operating room table, prone position, Wilson frame, sliding OR table. All pressure points were well padded. The arms in 90-90 well-padded at the elbows. Standard prep with DuraPrep solution lower dorsal spine to the mid sacral segment. Draped in the usual manner iodine Vi-Drape was used. Time-out procedure was called and correct. 2x 18-gauge spinal needle was then inserted at the expected L5-S1 level. C-arm was draped sterilely to the field and used to identify the spinal needles positions. The needle was at the lower  aspect of the lamina of L5. Skin superior to this was then infiltrated with local marcaine 1/2% 1:1 exparel 1.3%  total of 20 cc used. An incision approximately an inch inch and a half in length was then made through skin and subcutaneous layers ellipsing the old incision scar in line with the right side of the expected midline just superior to the spinal needle entry point. An incision made into the right lumbosacral fascia approximately an inch and one half in length .  Smallest dilator was then introduced into the incision site and used to carefully form subperiosteal movement of the hip paralumbar muscles off of the posterior lamina of the expected L5-S1 level. Successive dilators were then carried up to the 11 mm size. The depth measured off of the dilators at about 60 mm and 60 mm retractors and placed on the scaffolding for the MIS equipment and guided over dilators down to and docking on the posterior aspect of the lamina at the expected L5-S1 level. This was sterilely attached to the articulating arm and it's up right which had been attached the OR table sterilely. C-arm fluoroscopy was identified the dilators and the retractors at the appropriate level L5-S1 and L4-5. Unfortunately the depuy MIS equipment proved burdensome and the retractor was replaced with the [place] retractor.The operating room microscope sterilely draped brought into the field. Under the operating room microscope, the L5-S1 interspace carefully debrided the small amount of muscle attachment here and high-speed bur used to drill the medial aspect of the inferior articular process of L5 approximately 15%. A localization lateral C-arm view was obtained with Penfield 4 in the L5-S1 facet. 2 mm Kerrison  then used to enter the spinal canal over the superior aspect of the S1 lamina carefully using the Kerrison to debris the attachment as a curet. Foraminotomy was then performed over the S1 nerve root. The medial 10% superior  articular process of S1 and then resected using a 2 mm Kerrison. This allowed for identification of the thecal sac. Penfield 4 was then used to carefully mobilize the thecal sac medially and the S1 nerve root identified within the lateral recess flattened over the posterior aspect of the herniated disc. Carefully the lateral aspect of the S1 nerve root was identified and a Penfield 4 was used to mobilize the nerve medially such that the herniated disc was visible with microscope. Using a Penfield 4 for retraction and a 15 blade scalpel was used to incise the posterior longitudinal ligament within the lateral recess on the left side longitudinally. Disc material extruded and this was removed using micropituitary rongeurs and nerve hook nerve root and then more easily able to be mobilized medially and retracted using a love retractor. Further foraminotomies was performed over the L5 nerve root the nerve root was noted to be decompressed. The nerve root able to be retracted along the medial aspect of the S1 pedicle and disc material  to be l was further resected current pituitary rongeurs.  Ligamentum flavum was further debrided superiorly to the level L5-S1 disc. Had a moderate amount of further resection of the S1 lamina inferiorly was performed. With this then the disc space at L5-S1 was easily visualized and entry into the disc at the sided disc herniation was possible using a Penfield 4 intraoperative Lateral radiograph was used to identify the L5-S1 disc with the [place] 4 In place within the disc space. Micropituitary was used to further debride this material superficially from the posterior aspect of the intervertebral disc is posterior lateral aspect of the disc. Small amount of further disc material was found subligamentous extending inferiorly from the disc this was removed using micropituitary rongeurs. Ligamentum flavum was debrided and lateral recess along the medial aspect L5-S1 facet no further  decompression was necessary. Ball tip nerve probe was then able to carefully palpate the neuroforamen for L5 and S1 finding these to be well decompressed. Attention then turned to the right L4-5 level which was easily visualized with the microscope. Soft tissues debrided about the previous laminotomy area over the posterior aspect of the L4-5 interspace. High-speed bur and then used to carefully drill inferior 3 or 4 mm of the right side L4 lamina and on the medial aspect of the right L4 inferior articular process of 3 mm. The superior margin of the L5 lamina then carefully debrided with curette used to enter the spinal canal over the superior aspect of the L5 lamina resecting bone over the superior aspect and freeing up the attachment of ligamentum flavum here. Ligamentum flavum then debrided with the 3 mm Kerrison we performed of the L5 nerve root and the lateral recess decompressed using 2 and 3 mm Kerrisons sizing hypertrophic reflected ligamentum flavum extending superiorly. From was resected off the ventral aspect of the inferior margin of the L4 lamina. Hockey-stick nerve probe could then be passed out the L4 neuroforamen of the L5 neuroforamen. Venous bleeding encountered. Thrombin-soaked Gelfoam used to control this following this then the sac and the L5 nerve root were mobilized medially and the L4-5 disc examined and found to be herniated. The disc was incised longitudinally while retracting the thecal sac and L5 nerve  root.  The disc was removed with pituitary ronguers and epstien curretts used to further debride the subligamentous portion of the disc central and right side. A small fragment of disc was found attached to the lateral aspect of the right thecal sac ventral in location consistent with the small recurrent HNP seen on this patients 's preoperative MRI study. Irrigation was carried out down to this bleeding controlled with Gelfoam. Gelfoam was then removed. Irrigation carried careful  examination demonstrated no active bleeding present. Retractors were then carefully removed Since carefully then the Bleeding was then controlled using thrombin-soaked Gelfoam small cottonoids. Small amount of bleeding within the soft tissue mass the laminotomy area was controlled using bipolar electrocautery. Irrigation was carried out using copious amounts of irrigant solution. All Gelfoam were then removed. No significant active bleeding present at the time of removal. All instruments sponge counts were correct traction system was then carefully removed carefully rotating retractors with this withdrawal and only bipolar electrocautery of any small bleeders. Lumbodorsal fascia was then carefully approximated with interrupted 0 Vicryl sutures, UR 6 needle deep subcutaneous layers were approximated with interrupted 0 Vicryl sutures on UR 6 the appear subcutaneous layers approximated with interrupted 2-0 Vicryl sutures and the skin closed with a running subcutaneous stitch of 4-0 Vicryl. Dermabond was applied allowed to dry and then Mepilex bandage applied. Patient was then carefully returned to supine position on a stretcher, reactivated and extubated. He was then returned to recovery room in satisfactory condition.  Benjiman Core PA-C perform the duties of assistant surgeon during this case. He was present from the beginning of the case to the end of the case assisting in transfer the patient from his stretcher to the OR table and back to the stretcher at the end of the case. Assisted in careful retraction and suction of the laminectomy site delicate neural structures operating under the operating room microscope.      Sherley Mckenney E  09/23/2014, 7:25 PM

## 2014-09-23 NOTE — Transfer of Care (Signed)
Immediate Anesthesia Transfer of Care Note  Patient: [name] [name]  Procedure(s) Performed: Procedure(s): RIGHT L4-5 AND L5-S1 MICRODISCECTOMIES (N/A)  Patient Location: PACU  Anesthesia Type:General  Level of Consciousness: sedated and patient cooperative  Airway & Oxygen Therapy: Patient Spontanous Breathing and Patient connected to nasal cannula oxygen  Post-op Assessment: Report given to PACU RN and Post -op Vital signs reviewed and stable  Post vital signs: Reviewed and stable  Complications: No apparent anesthesia complications

## 2014-09-23 NOTE — Interval H&P Note (Signed)
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 After consideration of risks, benefits and other options for treatment, the patient has consented to  Procedure(s): RIGHT L4-5 AND L5-S1 MICRODISCECTOMIES (N/A) as a surgical intervention .  The patient's history has been reviewed, patient examined, no change in status, stable for surgery.  I have reviewed the patient's chart and labs.  Questions were answered to the patient's satisfaction.     Eldonna Neuenfeldt E

## 2014-09-23 NOTE — H&P (Signed)
[name] [name] is an 48 y.o. female.   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 Patient's history is significant for fourth time around microdiscectomy at the L4-5 level. Patient is undergone previous right-sided L4-5 microdiscectomy in July 2014 followed by recurrent disc herniation in October 2014 for which she underwent redo microdiscectomy with acute foot drop. She did well for short period time then had recurrent discomfort is treated with physical therapy and then with persistent pain and discomfort in her back and right-sided sciatica developed pain into her left leg as well MRI scan demonstrated a large central disc protrusion with both left and right sided nerve root entrapment and she underwent bilateral microdiscectomy L4-5 in June 2015. Following that surgery she did well for her. It nearly 4 months and began experiencing progressive lower extremity sciatica on the right side. He is undergone attempts at further conservative management including multiple epidural steroids. He had discussion regarding fusion of the L4-5 level follow-up MRI scan has demonstrated right-sided L5-S1 lateral recess disc herniation with impression on the right S1 nerve root in addition to a small recurrent disc herniation right side L4-5. After discussion of the risks and benefits of surgery including a lumbar fusion at L4-5 versus repeat microdiscectomy surgery the patient wishes to have microdiscectomy rather than a fusion procedure. She understands the risks and benefits of the procedure understands that she may have a recurrent disc herniation the risk of recurrent disc herniations 1 at 20 regardless of how many discectomies have been done. Her clinical exam suggests ongoing sciatic tension signs on the right side with weakness right  foot dorsi flexion of EHL as well as anterior tibialis. MRI scan shows nerve compression right L4 and L5 as well as nerve compression of the right S1 nerve root. She is brought to the operating room to undergo repeat right-sided L4-5 microdiscectomy and for stridor around right-sided L5-S1 microdiscectomy.  Past Medical History  Diagnosis Date  . Adrenal gland hypofunction     related to post op, vancomycin & lumbar injection, steroids    . Depression   . Cancer     "early evolvng melanoma "- legs   . Arthritis     hnp-lumbar   . PONV (postoperative nausea and vomiting)     last 2 surgeries less nausea  . PONV (postoperative nausea and vomiting)     none with last surgery  . Blood dyscrasia     hx probable blood clot after ankle surgery  not definitive but treated per pt  . High cholesterol   . Heart murmur     years ago  . Pneumonia ~ 2009; 2010  . History of bronchitis   . History of blood transfusion     "16 so far" (07/25/2012)  . 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 Migraines   . Hypertension     takes HCTZ  . History of UTI   . Family history of adverse reaction to anesthesia     mom has N/V - zofran works    Past Surgical History  Procedure Laterality Date  . Melanoma excision  06/2011    right lower leg; "early evolving"  . Lumbar laminectomy  03/19/2012    Procedure: MICRODISCECTOMY LUMBAR LAMINECTOMY;  Surgeon: [name], MD;  Location: [facility];  Service: Orthopedics;  Laterality: Right;  MIS Right  L3-4 lateral recess decompression and Right L4-5 Microdiscectomy  . Diagnostic laparoscopy      2 cysts removed fr. R ovary   . Laminectomy and microdiscectomy lumbar spine  07/25/2012    redo right L4-5 microdiscectomy  . Tonsillectomy  1977  . Cholecystectomy  2004  . Vaginal hysterectomy  1995    have Ovaries  . Breast biopsy  ~ 2010    left breast, benign   . Breast lumpectomy  ~ 2010    left  . Repair peroneal tendons ankle  2010    right  . Bladder  suspension  2006  . Shoulder arthroscopy  ~ 2009    "frozen shoulder manipulation; right" (07/25/2012)  . Lumbar laminectomy  07/25/2012    Procedure: MICRODISCECTOMY LUMBAR LAMINECTOMY;  Surgeon: [name], MD;  Location: [facility];  Service: Orthopedics;  Laterality: N/A;  Re-do Right L4-5 Microdiscectomy  . Colonoscopy    . Lumbar laminectomy N/A 02/25/2014    Procedure: Bilateral L4-5 MICRODISCECTOMY with MIS;  Surgeon: [name], MD;  Location: [facility];  Service: Orthopedics;  Laterality: N/A;  . Appendectomy      Family History  Problem Relation Age of Onset  . Hypertension Mother   . Coronary artery disease Mother   . Hypertension Father    Social History:  reports that she has never smoked. She has never used smokeless tobacco. She reports that she drinks about 1.2 oz of alcohol per week. She reports that she does not use illicit drugs.  Allergies:  Allergies  Allergen Reactions  . Penicillins Anaphylaxis  . Sulfa Antibiotics Hives and Other (See Comments)    Hives, mouth blisters   . Ciprofloxacin Hives  . Phenergan [Promethazine Hcl] Other (See Comments)    Caused "severe drop in blood pressure."  . Vancomycin Cross Reactors Rash    Medications Prior to Admission  Medication Sig Dispense Refill  . b complex vitamins tablet Take 1 tablet by mouth daily.    Marland Kitchen escitalopram (LEXAPRO) 20 MG tablet Take 20 mg by mouth daily before breakfast.     . hydrochlorothiazide (HYDRODIURIL) 25 MG tablet Take 25 mg by mouth daily.    . potassium chloride SA (K-DUR,KLOR-CON) 20 MEQ tablet Take 20 mEq by mouth daily.    . rosuvastatin (CRESTOR) 20 MG tablet Take 20 mg by mouth daily.    Marland Kitchen topiramate (TOPAMAX) 50 MG tablet Take 150 mg by mouth daily.    Marland Kitchen buPROPion (WELLBUTRIN SR) 150 MG 12 hr tablet Take 150 mg by mouth daily.     . cyclobenzaprine (FLEXERIL) 10 MG tablet Take 1 tablet (10 mg total) by mouth 3 (three) times daily as needed for muscle spasms. (Patient not taking:  Reported on 09/17/2014) 30 tablet 1  . cyclobenzaprine (FLEXERIL) 10 MG tablet Take 1 tablet (10 mg total) by mouth 3 (three) times daily as needed for muscle spasms. (Patient not taking: Reported on 09/17/2014) 60 tablet 1  . cyclobenzaprine (FLEXERIL) 5 MG tablet Take 5 mg by mouth Twice daily as needed. For pain    . gabapentin (NEURONTIN) 300 MG capsule Take 300 mg by mouth 2 (two) times daily at 10 AM and 5 PM.    . gabapentin (NEURONTIN) 300 MG capsule Take 1 capsule (300 mg total) by mouth 2 (two) times daily at 10 AM and 5 PM. (Patient not taking: Reported on 09/17/2014) 60 capsule 6  . HYDROmorphone (DILAUDID) 4 MG tablet Take 4 mg by mouth every 4 (four)  hours as needed for severe pain (may take 4-8 mg every 4-6 hours).    . meloxicam (MOBIC) 15 MG tablet Take 15 mg by mouth daily.    . meloxicam (MOBIC) 15 MG tablet Take 1 tablet (15 mg total) by mouth daily. (Patient not taking: Reported on 09/17/2014) 30 tablet 6  . methylPREDNIsolone (MEDROL DOSPACK) 4 MG tablet follow package directions (Patient not taking: Reported on 09/17/2014) 21 tablet 0  . mometasone (NASONEX) 50 MCG/ACT nasal spray Place 2 sprays into the nose daily.    Marland Kitchen morphine (MSIR) 15 MG tablet Take 15 mg by mouth every 4 (four) hours as needed for severe pain.    Marland Kitchen oxyCODONE (OXYCONTIN) 20 MG 12 hr tablet Take 20 mg by mouth every 12 (twelve) hours.    . OxyCODONE (OXYCONTIN) 20 mg T12A 12 hr tablet Take 1 tablet (20 mg total) by mouth every 12 (twelve) hours. (Patient not taking: Reported on 09/17/2014) 60 tablet 0  . OxyCODONE (OXYCONTIN) 20 mg TB12 Take 1 tablet (20 mg total) by mouth every 12 (twelve) hours. (Patient not taking: Reported on 09/17/2014) 30 tablet 0  . oxyCODONE (ROXICODONE) 15 MG immediate release tablet Take 15 mg by mouth every 6 (six) hours as needed for pain.    Marland Kitchen oxyCODONE-acetaminophen (PERCOCET) 5-325 MG per tablet Take 1-2 tablets by mouth every 6 (six) hours as needed for pain. 60 tablet 0  .  oxyCODONE-acetaminophen (PERCOCET/ROXICET) 5-325 MG per tablet Take 1-2 tablets by mouth every 4 (four) hours as needed. (Patient not taking: Reported on 09/17/2014) 90 tablet 0  . oxyCODONE-acetaminophen (PERCOCET/ROXICET) 5-325 MG per tablet Take 1-2 tablets by mouth every 4 (four) hours as needed for severe pain (for breakthrough pain). (Patient not taking: Reported on 09/17/2014) 60 tablet 0  . zaleplon (SONATA) 10 MG capsule Take 10 mg by mouth At bedtime as needed. For sleep      Results for orders placed or performed during the hospital encounter of 09/23/14 (from the past 48 hour(s))  I-STAT 4, (NA,K, GLUC, HGB,HCT)     Status: None   Collection Time: 09/23/14 12:15 PM  Result Value Ref Range   Sodium 139 135 - 145 mmol/L   Potassium 4.7 3.5 - 5.1 mmol/L   Glucose, Bld 87 70 - 99 mg/dL   HCT 43.0 36.0 - 46.0 %   Hemoglobin 14.6 12.0 - 15.0 g/dL   Dg Chest 2 View  09/23/2014   CLINICAL DATA:  Preop lumbar HNP.  History of hypertension.  EXAM: CHEST  2 VIEW  COMPARISON:  02/25/2014  FINDINGS: Heart and mediastinal contours are within normal limits. No focal opacities or effusions. No acute bony abnormality. Nodular density previously questioned in the left upper lobe not visualized on today's study.  IMPRESSION: No active cardiopulmonary disease.   Electronically Signed   By: Rolm Baptise M.D.   On: 09/23/2014 12:49    Review of Systems  Constitutional: Positive for weight loss. Negative for fever, chills, malaise/fatigue and diaphoresis.  HENT: Negative.   Eyes: Negative.   Respiratory: Negative for cough, hemoptysis, sputum production, shortness of breath and wheezing.   Cardiovascular: Positive for claudication. Negative for chest pain, palpitations, orthopnea, leg swelling and PND.  Gastrointestinal: Negative for heartburn, nausea, vomiting, abdominal pain, diarrhea, constipation, blood in stool and melena.  Genitourinary: Negative for dysuria, urgency, frequency, hematuria and  flank pain.  Musculoskeletal: Positive for back pain. Negative for myalgias, joint pain, falls and neck pain.  Skin: Negative.  Negative for  itching and rash.  Neurological: Positive for tingling, tremors, sensory change, focal weakness and weakness. Negative for speech change, seizures and loss of consciousness.  Endo/Heme/Allergies: Negative for environmental allergies and polydipsia. Does not bruise/bleed easily.  Psychiatric/Behavioral: Negative.     Blood pressure 103/44, pulse 76, temperature 98.2 F (36.8 C), temperature source Oral, resp. rate 20, weight 69.854 kg (154 lb), SpO2 100 %. Physical Exam  Constitutional: She is oriented to person, place, and time. She appears well-developed and well-nourished. No distress.  HENT:  Head: Normocephalic and atraumatic.  Right Ear: External ear normal.  Left Ear: External ear normal.  Nose: Nose normal.  Mouth/Throat: Oropharynx is clear and moist. No oropharyngeal exudate.  Eyes: Conjunctivae and EOM are normal. Pupils are equal, round, and reactive to light. Right eye exhibits no discharge. Left eye exhibits no discharge. No scleral icterus.  Neck: Normal range of motion. Neck supple. No JVD present. No tracheal deviation present. No thyromegaly present.  Cardiovascular: Normal rate, regular rhythm, normal heart sounds and intact distal pulses.  Exam reveals no gallop and no friction rub.   No murmur heard. Respiratory: No stridor. No respiratory distress. She has no wheezes. She has no rales. She exhibits no tenderness.  GI: Soft. Bowel sounds are normal. She exhibits no distension and no mass. There is no tenderness. There is no rebound and no guarding.  Musculoskeletal: She exhibits tenderness. She exhibits no edema.  Lymphadenopathy:    She has no cervical adenopathy.  Neurological: She is alert and oriented to person, place, and time. She displays abnormal reflex. No cranial nerve deficit. She exhibits abnormal muscle tone.  Coordination normal.  Skin: Skin is warm and dry. No rash noted. She is not diaphoretic. No erythema. No pallor.     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  Plan: Right L4-5 and right L5-S1 microdiscectomy. Hydrocortisone perioperative adrenal coverage.   Willett Lefeber E 09/23/2014, 3:09 PM

## 2014-09-23 NOTE — Anesthesia Postprocedure Evaluation (Signed)
Anesthesia Post Note  Patient: [name] [name]  Procedure(s) Performed: Procedure(s) (LRB): RIGHT L4-5 AND L5-S1 MICRODISCECTOMIES (N/A)  Anesthesia type: General  Patient location: PACU  Post pain: Pain level controlled and Adequate analgesia  Post assessment: Post-op Vital signs reviewed, Patient's Cardiovascular Status Stable, Respiratory Function Stable, Patent Airway and Pain level controlled  Last Vitals:  Filed Vitals:   09/23/14 2030  BP: 104/59  Pulse: 72  Temp: 36.4 C  Resp: 8    Post vital signs: Reviewed and stable  Level of consciousness: awake, alert  and oriented  Complications: No apparent anesthesia complications

## 2014-09-23 NOTE — H&P (Signed)
[facility]   A Division of [facility]   [address], [place], [place] [address] Telephone: [phone]  Fax: [phone]     PATIENT: Nickie, Warwick   MR#: [id number]  DOB: 1965-12-27   Visit Date: 09/11/2014     Cellie is seen today in followup of her back.  She has completed her studies for this semester at [employer] and apparently has done extremely well.  We placed her on oxycodone without Tylenol, 15 mg tablet every 3 or 4 hours to relieve her pain.  She relates that her pain is severe and she has had to take continuous narcotic medicine to relieve her discomfort into her back and pain radiating to right lower extremity.  That injection scheduled, but it was not done. It was on the last day of her finals; she really could not have it.  She has difficulty sleeping.  She has numbness in her right leg, lateral plantar aspect of the right foot.  Pain when she wakes up, pain throughout the entire day, pain at the end of the day.  Cannot get comfortable.  She is taking oxycodone 15 mg tablets every 3-4 hours.     PHYSICAL EXAMINATION:  She has positive straight leg raise sign on the right, 3 degrees short of full extension.  Positive popliteal compression sign on the right.  Reflexes at the knee are 2+ and symmetric; at the ankle, trace on the right, 1+ left.  She has weakness in right foot dorsiflexion, only a flicker of dorsiflexion strength compared with the left.  Left dorsiflexion strength is normal.  Plantarflexion strength is weak on the right as well.  She is unable to heel or toe stand on her right.     RADIOGRAPHS:  Her MRI scan from August demonstrated a small recurrent disk protrusion, right side, L4-5.  Also showed disk protrusion on the right side at L5-S1 with impression on the right side at L4-5 causing right L5 nerve compression, and on the right side at L5-S1 causing S1 nerve root compression within the lateral recess.   Disk fragment at L5-S1 is directly into the lateral recess and is focal for compression at S1.     ASSESSMENT:  Recurrent disk protrusion, right L4-5, with a fragment that appears to be encapsulated in scar tissue but causing persistent lateral recess narrowing along the right side.  Left side is well decompression with previous surgery.  L5-S1 is a new level for disk protrusion that is into the lateral recess affecting right S1 nerve root.    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  She does not have a spondylolisthesis, but a 4th time around disk protrusion is worrisome for the risk that she may have even further disk protrusions in the future.  She is adverse to any fusion surgery yet she has significant sciatic tension signs and has had persistent ongoing back pain and radiation in the right leg dating all the way back to August.  She has completed enough conservative management over the last 4 months that I think at this time we will go ahead and schedule her to undergo intervention.  I will go ahead and schedule her to undergo a right-sided re-do microdiskectomy at L4-5 with a small  recurrent disk here that is causing persistent L5 symptoms of weakness.  Also perform right-sided L5-S1 microdiskectomy to relieve pressure on S1 nerve root.  The risks of surgery including the risks of infection, bleeding, risk to the nerve itself were discussed with Aziya.  Discussed the fact that the nerve has already had 3 surgical procedures that certainly have made the nerve at risk of being adhesed and scarred into the corner of the canal so that it cannot adapt to further pressure here from a recurrent disk rupture.  Also discussed the fact that the nerve itself being manipulated and moved multiple times is  certainly at risk of having chronic changes that may not be reversible.  I do not really see any other options at this point.  I think as there is persistent compression present by MRI, then intervention is warranted as she has had continued problems again.  If these do not relieve her pain and we do obtain adequate decompression, then I think a spinal cord stimulator would then be appropriate.  We will go ahead and schedule surgery on the right side at L4-5 and L5-S1, MIS approach, microdiscectomy at L4-5 for recurrent disk herniation, at L5-S1 for primary disk herniation.     For additional information please see handwritten notes, reports, orders and prescriptions in this chart.      [name], M.D.    Auto-Authenticated by [name], M.D.  JEN/sw DD: 09/11/2014  DT: 09/14/2014    [facility]   A Division of [facility] [place]   [address], [place], [place] [address] Telephone: [phone]  Fax: [phone]     PATIENT: Anelis, Hrivnak   MR#: [id number]  DOB: 11-25-1965   Visit Date: 08/27/2014     CHIEF COMPLAINT:  Low back pain.     HISTORY OF PRESENT ILLNESS:  A 48 year old female.  She has had a disk herniation surgery at L4-5, a decompression at L3-4 level on the right side, initially with a recurrent disk herniation at L4-5 and a very large recurrent disk herniation at L4-5.  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  Unfortunately, though, she is having problems with persistent pain in her back and sciatica of the right lower extremity.  Reports that her right leg is going numb and it has been numb for about a week.  She is having a tingling sensation radiating down her right lower extremity.  She has pain that keeps her awake at nighttime.  Has questions about whether or not an MRI scan should be repeated.  Her last MRI was only  a month and a half ago.  It showed a very small recurrent disk protrusion at L4-5.  She has a disk protrusion at L5-S1 that extends to the right side as well.  No bowel or bladder symptoms.  She has not had any significant fever or chills.  Reports that finals week is approaching and she is concerned.  She feels like the level of discomfort she is experiencing is making it difficult for her to be able to function well and be able to get her studies done.     PHYSICAL EXAMINATION:  Her sciatic tension tests on the right side mildly uncomfortable at about 30 degrees short of full extension.  She has weakness in right foot dorsiflexion at baseline weakness.  She is able to generate elevation  of the foot against gravity but very little beyond that, which is 3-5 strength.     ASSESSMENT:  This patient is having recurrent discomfort into her back and has a small disk protrusion along the right side at L5-S1.  She has a small recurrent disk herniation at L4-5 along the right.  I discussed previously with her considering a fusion at the L4-5 level mainly because this would represent her fourth-time around surgery for disk herniation at this segment and I think to try and keep her from having recurrent disk protrusion, she may wish to consider decompression at both the L4-5 and L5-S1 levels.  For now, I am more concerned that she will not be able to complete her studies if she has surgery on her back.  Even if we decide not to do a fusion, she would still have a difficult time getting through finals week with a surgical procedure on her spine.       PLAN:  With that being the case, I think considerations would be for further epidural steroid to see if this would knock down her discomfort and make it so that she might be able to last through the next week, and then consider intervention.  I think that overall her situation is precarious in terms of being able to do her studies and to be taking medicines to relieve her  pain.  Her husband has had recent fusion surgery, has done very well with his, but she is not really quite at the point where she wants to consider this.  She also does not really have enough time to be doing some physical therapy.  Use of a TENS unit might be of some benefit, but I think right now we will try her on medications, try and decrease her level of pain.  I will plan on seeing her back in followup the next 2 weeks to reassess her condition, determine if we can consider intervening at that point.  As her MRI scan has only been 6 weeks ago and her pain pattern remains fairly similar, my tendency is to treat her symptomatically based on the most recent study rather than repeat her study.   For additional information please see handwritten notes, reports, orders and prescriptions in this chart.      [name], M.D.    Auto-Authenticated by [name], M.D.  JEN/sw DD: 09/06/2014  DT: 09/07/2014

## 2014-09-23 NOTE — Plan of Care (Signed)
Problem: Consults Goal: Diagnosis - Spinal Surgery Microdiscectomy  Right L4-5 and L5-S1 microdiscectomy

## 2014-09-23 NOTE — Discharge Instructions (Signed)
    No lifting greater than 10 lbs. Avoid bending, stooping and twisting. Walk in house for first week them may start to get out slowly increasing distance up to one mile by 3 weeks post op. Keep incision dry for 3 days, may use tegaderm or similar water impervious dressing.  

## 2014-09-23 NOTE — Anesthesia Preprocedure Evaluation (Signed)
Anesthesia Evaluation  Patient identified by MRN, date of birth, ID band Patient awake    Reviewed: Allergy & Precautions, H&P , NPO status , Patient's Chart, lab work & pertinent test results  History of Anesthesia Complications (+) PONV and history of anesthetic complications  Airway Mallampati: II       Dental  (+) Teeth Intact   Pulmonary neg shortness of breath, neg sleep apnea, neg COPDneg recent URI,  breath sounds clear to auscultation        Cardiovascular hypertension, Pt. on medications - angina- Past MI, - CABG and - CHF Rhythm:Regular     Neuro/Psych  Headaches, PSYCHIATRIC DISORDERS Depression Back pain with right leg symptoms  Neuromuscular disease    GI/Hepatic negative GI ROS, Neg liver ROS,   Endo/Other  H/o adrenal insufficieny in 2013 after back surgery, no oral steroids in last year and 2 steroid back injections  Renal/GU negative Renal ROS     Musculoskeletal   Abdominal   Peds  Hematology negative hematology ROS (+)   Anesthesia Other Findings   Reproductive/Obstetrics                             Anesthesia Physical Anesthesia Plan  ASA: II  Anesthesia Plan: General   Post-op Pain Management:    Induction: Intravenous  Airway Management Planned: Oral ETT  Additional Equipment: None  Intra-op Plan:   Post-operative Plan: Extubation in OR  Informed Consent: I have reviewed the patients History and Physical, chart, labs and discussed the procedure including the risks, benefits and alternatives for the proposed anesthesia with the patient or authorized representative who has indicated his/her understanding and acceptance.   Dental advisory given  Plan Discussed with: CRNA and Surgeon  Anesthesia Plan Comments:         Anesthesia Quick Evaluation

## 2014-09-24 ENCOUNTER — Encounter (HOSPITAL_COMMUNITY): Payer: Self-pay | Admitting: Specialist

## 2014-09-24 DIAGNOSIS — M5126 Other intervertebral disc displacement, lumbar region: Secondary | ICD-10-CM | POA: Diagnosis not present

## 2014-09-24 LAB — BASIC METABOLIC PANEL
Anion gap: 5 (ref 5–15)
BUN: 7 mg/dL (ref 6–23)
CO2: 27 mmol/L (ref 19–32)
Calcium: 8 mg/dL — ABNORMAL LOW (ref 8.4–10.5)
Chloride: 107 mEq/L (ref 96–112)
Creatinine, Ser: 0.51 mg/dL (ref 0.50–1.10)
GFR calc Af Amer: >90 mL/min (ref >90)
GFR calc non Af Amer: >90 mL/min (ref >90)
GLUCOSE: 100 mg/dL — AB (ref 70–99)
POTASSIUM: 3.2 mmol/L — AB (ref 3.5–5.1)
Sodium: 139 mmol/L (ref 135–145)

## 2014-09-24 MED ORDER — METHOCARBAMOL 500 MG PO TABS: 500.0000 mg | ORAL_TABLET | Freq: Four times a day (QID) | ORAL | Status: DC

## 2014-09-24 MED ORDER — OXYCODONE-ACETAMINOPHEN 5-325 MG PO TABS: 1.0000 | ORAL_TABLET | ORAL | Status: DC | PRN

## 2014-09-24 MED ORDER — OXYCODONE-ACETAMINOPHEN 10-325 MG PO TABS: 1.0000 | ORAL_TABLET | Freq: Four times a day (QID) | ORAL | Status: DC | PRN

## 2014-09-24 MED ORDER — ONDANSETRON 4 MG PO TBDP: 4.0000 mg | ORAL_TABLET | Freq: Three times a day (TID) | ORAL | Status: DC | PRN

## 2014-09-24 MED ORDER — POTASSIUM CHLORIDE CRYS ER 20 MEQ PO TBCR
20.0000 meq | EXTENDED_RELEASE_TABLET | Freq: Every day | ORAL | Status: DC
  Administered 2014-09-24: 20 meq via ORAL
  Filled 2014-09-24: qty 1

## 2014-09-24 MED ORDER — POTASSIUM CHLORIDE CRYS ER 20 MEQ PO TBCR: 20.0000 meq | EXTENDED_RELEASE_TABLET | Freq: Every day | ORAL | Status: DC

## 2014-09-24 NOTE — Progress Notes (Signed)
UR completed 

## 2014-09-24 NOTE — Evaluation (Signed)
Physical Therapy Evaluation Patient Details Name: [name] [name] MRN: [id number] DOB: 11-19-1965 Today's Date: 09/24/2014   History of Present Illness  48 y.o. s/p RIGHT L4-5 AND L5-S1 MICRODISCECTOMIES. Pt with chronic right foot drop and previous back surgeries.  Clinical Impression  Patient is s/p above surgery resulting in the deficits listed below (see PT Problem List).  Patient will benefit from skilled PT to increase their independence and safety with mobility (while adhering to their precautions) to allow discharge to the venue listed below.Pt refuses need for AFO on Rt LE. Pt with ataxic like gt with Rt Le and intermittent buckling. Pt adamant on D/C home today.  Pt is a fall risk. Educated pt that she will need to have physical (A) for mobility and use of RW at all times for mobility upon D/C, pt and husband verbalized understanding.      Follow Up Recommendations Home health PT;Supervision/Assistance - 24 hour    Equipment Recommendations  None recommended by PT    Recommendations for Other Services       Precautions / Restrictions Precautions Precautions: Back;Fall Precaution Comments: pt able to independently recall back precautions  Restrictions Weight Bearing Restrictions: No Other Position/Activity Restrictions: pt reports she does not wear AFO 24/7      Mobility  Bed Mobility Overal bed mobility: Needs Assistance Bed Mobility: Rolling;Sidelying to Sit;Sit to Sidelying Rolling: Modified independent (Device/Increase time) Sidelying to sit: Modified independent (Device/Increase time)     Sit to sidelying: Modified independent (Device/Increase time) General bed mobility comments: pt able to recall and perform log rolling technique with incr time   Transfers Overall transfer level: Needs assistance Equipment used: Rolling walker (2 wheeled) Transfers: Sit to/from Stand Sit to Stand: Min guard         General transfer comment: min guard to steady and  cues for hand placement and safety with RW  Ambulation/Gait Ambulation/Gait assistance: Min assist Ambulation Distance (Feet): 150 Feet Assistive device: Rolling walker (2 wheeled) Gait Pattern/deviations: Step-through pattern (Rt LE ataxic like and buckling at times ) Gait velocity: decr Gait velocity interpretation: Below normal speed for age/gender General Gait Details: min (A) to balance and steady due to intermittent Rt LE buckling; recommend use of RW and (A) at all times with mobility; cues for safety  Stairs            Wheelchair Mobility    Modified [name] (Stroke Patients Only)       Balance Overall balance assessment: Needs assistance Sitting-balance support: Feet supported;No upper extremity supported Sitting balance-Leahy Scale: Good     Standing balance support: During functional activity;Bilateral upper extremity supported Standing balance-Leahy Scale: Poor Standing balance comment: RW to balance                              Pertinent Vitals/Pain Pain Assessment: 0-10 Pain Score: 6  Pain Location: surgical site Pain Descriptors / Indicators: Discomfort;Sore Pain Intervention(s): Monitored during session;Premedicated before session;Repositioned    Home Living Family/patient expects to be discharged to:: Private residence Living Arrangements: Spouse/significant other Available Help at Discharge: Family;Friend(s) Type of Home: House Home Access: Stairs to enter Entrance Stairs-Rails: None Entrance Stairs-Number of Steps: 2 Home Layout: Two level;Able to live on main level with bedroom/bathroom Home Equipment: [place] - 2 wheels;Bedside commode;Adaptive equipment;Hand held shower head Additional Comments: pt with 4 previous back surgeries; husband with recent back surgery in October    Prior Function Level of Independence:  Independent               Hand Dominance   Dominant Hand: Right    Extremity/Trunk Assessment   Upper  Extremity Assessment: Defer to OT evaluation           Lower Extremity Assessment: Generalized weakness;RLE deficits/detail RLE Deficits / Details: grossly 3+/5    Cervical / Trunk Assessment: Normal  Communication   Communication: No difficulties  Cognition Arousal/Alertness: Awake/alert Behavior During Therapy: WFL for tasks assessed/performed Overall Cognitive Status: Within Functional Limits for tasks assessed                      General Comments      Exercises        Assessment/Plan    PT Assessment Patient needs continued PT services  PT Diagnosis Difficulty walking;Acute pain;Generalized weakness   PT Problem List Decreased strength;Decreased activity tolerance;Decreased balance;Decreased mobility;Decreased knowledge of precautions;Pain;Impaired sensation  PT Treatment Interventions DME instruction;Functional mobility training;Gait training;Stair training;Therapeutic activities;Therapeutic exercise;Balance training;Neuromuscular re-education;Patient/family education   PT Goals (Current goals can be found in the Care Plan section) Acute Rehab PT Goals Patient Stated Goal: To go home today PT Goal Formulation: With patient Time For Goal Achievement: 10/01/14 Potential to Achieve Goals: Good    Frequency Min 5X/week   Barriers to discharge        Co-evaluation               End of Session Equipment Utilized During Treatment: Gait belt Activity Tolerance: Patient tolerated treatment well Patient left: in bed;with call bell/phone within reach;with family/visitor present Nurse Communication: Mobility status;Precautions    Functional Assessment Tool Used: clinical judgement  Functional Limitation: Mobility: Walking and moving around Mobility: Walking and Moving Around Current Status [phone]): At least 1 percent but less than 20 percent impaired, limited or restricted Mobility: Walking and Moving Around Goal Status [phone]): At least 1 percent but  less than 20 percent impaired, limited or restricted Mobility: Walking and Moving Around Discharge Status [phone]): At least 1 percent but less than 20 percent impaired, limited or restricted    Time: 1340-1356 PT Time Calculation (min) (ACUTE ONLY): 16 min   Charges:   PT Evaluation $Initial PT Evaluation Tier I: 1 Procedure PT Treatments $Gait Training: 8-22 mins   PT G Codes:   PT G-Codes **NOT FOR INPATIENT CLASS** Functional Assessment Tool Used: clinical judgement  Functional Limitation: Mobility: Walking and moving around Mobility: Walking and Moving Around Current Status (Z9935): At least 1 percent but less than 20 percent impaired, limited or restricted Mobility: Walking and Moving Around Goal Status [phone]): At least 1 percent but less than 20 percent impaired, limited or restricted Mobility: Walking and Moving Around Discharge Status [phone]): At least 1 percent but less than 20 percent impaired, limited or restricted    Gustavus Bryant, [place] 09/24/2014, 2:49 PM

## 2014-09-24 NOTE — Progress Notes (Signed)
Discharge instruction gave to pt and all questions  Answered. Patient sates she understands and is ready to be discharged.

## 2014-09-24 NOTE — Progress Notes (Signed)
Subjective: Doing ok.  C/o some back pain and chronic right foot drop.    Objective: Vital signs in last 24 hours: Temp:  [97.5 F (36.4 C)-98.2 F (36.8 C)] 98.2 F (36.8 C) (12/30 0500) Pulse Rate:  [66-87] 74 (12/30 0500) Resp:  [8-20] 16 (12/30 0500) BP: (90-109)/(44-67) 90/50 mmHg (12/30 0500) SpO2:  [94 %-100 %] 100 % (12/30 0500) Weight:  [69.854 kg (154 lb)] 69.854 kg (154 lb) (12/29 1217)  Intake/Output from previous day: 12/29 0701 - 12/30 0700 In: 2950 [I.V.:2800] Out: 6384 [Urine:1450; Blood:200] Intake/Output this shift:     Recent Labs  09/22/14 1022 09/23/14 1215  HGB 14.3 14.6    Recent Labs  09/22/14 1022 09/23/14 1215  WBC 5.8  --   RBC 4.48  --   HCT 42.4 43.0  PLT 260  --     Recent Labs  09/22/14 1022 09/23/14 1215 09/24/14 0609  NA 137 139 139  K 2.9* 4.7 3.2*  CL 101  --  107  CO2 31  --  27  BUN 9  --  7  CREATININE 0.75  --  0.51  GLUCOSE 97 87 100*  CALCIUM 9.2  --  8.0*   No results for input(s): LABPT, INR in the last 72 hours.  Exam:  Dressing C/D/I.  bilat calves nontender.  Right anterior tib/EHL/gastroc weakness.  Left is strong.    Assessment/Plan: Will see how she does with ambulation.  Possible d/c home today.  Scripts on chart for percocet, robaxin, zofran.     Crystale Giannattasio M 09/24/2014, 8:29 AM

## 2014-09-24 NOTE — Evaluation (Signed)
Occupational Therapy Evaluation Patient Details Name: [name] [name] MRN: [id number] DOB: 1966-04-20 Today's Date: 09/24/2014    History of Present Illness 48 y.o. s/p RIGHT L4-5 AND L5-S1 MICRODISCECTOMIES. Pt with chronic right foot drop and previous back surgeries.   Clinical Impression   Pt s/p above. Pt independent with ADLs, PTA. Feel pt will benefit from acute OT to increase independence with BADLs, prior to d/c. Recommending HHOT upon d/c.    Follow Up Recommendations  Home health OT;Supervision/Assistance - 24 hour    Equipment Recommendations  None recommended by OT    Recommendations for Other Services       Precautions / Restrictions Precautions Precautions: Back;Fall Precaution Comments: educated on back precautions Restrictions Weight Bearing Restrictions: No      Mobility Bed Mobility Overal bed mobility: Needs Assistance Bed Mobility: Rolling;Sidelying to Sit;Sit to Sidelying Rolling: Modified independent (Device/Increase time);Supervision Sidelying to sit: Supervision     Sit to sidelying: Supervision General bed mobility comments: cues for technique/precautions.  Transfers Overall transfer level: Needs assistance Equipment used: Rolling walker (2 wheeled) Transfers: Sit to/from Stand Sit to Stand: Min assist         General transfer comment: cues for hand placement.    Balance                                            ADL Overall ADL's : Needs assistance/impaired     Grooming: Oral care;Min guard;Standing           Upper Body Dressing : Set up;Supervision/safety;Sitting   Lower Body Dressing: Minimal assistance;With adaptive equipment;Sit to/from stand   Toilet Transfer: Moderate assistance;Ambulation;RW;Comfort height toilet;Grab bars   Toileting- Clothing Manipulation and Hygiene: Min guard;Sit to/from stand       Functional mobility during ADLs: Moderate assistance;Rolling walker General ADL  Comments: Educated on LB dressing technique and pt practiced with reacher to donn panties. Educated on safety. Educated on positioning of pillows. Educated on use of cup for oral care and placement of grooming items to avoid bending/twisting.  Discussed d/c plans. Educated on shower transfer technique.      Vision                     Perception     Praxis      Pertinent Vitals/Pain Pain Assessment: 0-10 Pain Score:  (4-5) Pain Location: back Pain Intervention(s): Monitored during session;Repositioned   BP supine 90/55 and around 107/67 sitting EOB.     Hand Dominance Right   Extremity/Trunk Assessment Upper Extremity Assessment Upper Extremity Assessment: Overall WFL for tasks assessed   Lower Extremity Assessment Lower Extremity Assessment: Defer to PT evaluation       Communication Communication Communication: No difficulties   Cognition Arousal/Alertness: Awake/alert Behavior During Therapy: WFL for tasks assessed/performed Overall Cognitive Status: Within Functional Limits for tasks assessed                     General Comments       Exercises       Shoulder Instructions      Home Living Family/patient expects to be discharged to:: Private residence Living Arrangements: Spouse/significant other Available Help at Discharge: Family;Friend(s) (spouse with back brace due to back surgery; friend coming in to help and pt's daughter will be there to assist as well) Type of Home: House Home Access: Stairs  to enter Entrance Stairs-Number of Steps: 3 Entrance Stairs-Rails: None Home Layout: Two level;Able to live on main level with bedroom/bathroom     Bathroom Shower/Tub: Occupational psychologist: Standard Bathroom Accessibility: Yes How Accessible: Accessible via walker Home Equipment: [place] - 2 wheels;Bedside commode;Adaptive equipment;Hand held shower head (seat they use for shower chair) Adaptive Equipment: Reacher;Sock  aid;Long-handled shoe horn;Long-handled sponge        Prior Functioning/Environment Level of Independence: Independent             OT Diagnosis: Acute pain   OT Problem List: Decreased strength;Pain;Decreased knowledge of precautions;Decreased knowledge of use of DME or AE;Impaired balance (sitting and/or standing);Decreased activity tolerance;Decreased coordination   OT Treatment/Interventions: Self-care/ADL training;DME and/or AE instruction;Therapeutic activities;Patient/family education;Balance training    OT Goals(Current goals can be found in the care plan section) Acute Rehab OT Goals Patient Stated Goal: not stated OT Goal Formulation: With patient Time For Goal Achievement: 10/01/14 Potential to Achieve Goals: Good ADL Goals Pt Will Perform Grooming: with set-up;standing Pt Will Transfer to Toilet: ambulating;with min guard assist (3 in 1 over commode) Additional ADL Goal #1: Pt will maintain back precautions during ADLs and functional activities.  OT Frequency: Min 2X/week   Barriers to D/C:            Co-evaluation              End of Session Equipment Utilized During Treatment: Gait belt;Rolling walker  Activity Tolerance: Patient tolerated treatment well Patient left: in bed;with call bell/phone within reach;with family/visitor present   Time: 2956-2130 OT Time Calculation (min): 40 min Charges:  OT General Charges $OT Visit: 1 Procedure OT Evaluation $Initial OT Evaluation Tier I: 1 Procedure OT Treatments $Self Care/Home Management : 8-22 mins G-CodesBenito Mccreedy OTR/L [phone] 09/24/2014, 1:11 PM

## 2014-09-25 NOTE — Progress Notes (Signed)
09/25/14 Contacted patient and spoke with her about [facility]. She Selected [facility] which she had worked with in past. Contacted Miranda at [facility] and set up [place] [facility] [place]. No equipment needs identified. Fuller Plan RN, BSN, CCM

## 2014-09-29 NOTE — Progress Notes (Signed)
OT evaluation addendum    09/24/14 1258  OT Time Calculation  OT Start Time (ACUTE ONLY) 1036  OT Stop Time (ACUTE ONLY) 1116  OT Time Calculation (min) 40 min  OT G-codes **NOT FOR INPATIENT CLASS**  Functional Assessment Tool Used clinical judgment  Functional Limitation Self care  Self Care Current Status (W2376) CK  Self Care Goal Status (E8315) CI  OT General Charges  $OT Visit 1 Procedure  OT Evaluation  $Initial OT Evaluation Tier I 1 Procedure  OT Treatments  $Self Care/Home Management  8-22 mins

## 2014-09-30 NOTE — Anesthesia Postprocedure Evaluation (Signed)
  Anesthesia Post-op Note  Patient: [name] [name]  Procedure(s) Performed: Procedure(s): RIGHT L4-5 AND L5-S1 MICRODISCECTOMIES (N/A)  Patient Location: PACU  Anesthesia Type:General  Level of Consciousness: awake, alert  and oriented  Airway and Oxygen Therapy: Patient Spontanous Breathing and Patient connected to nasal cannula oxygen  Post-op Pain: mild  Post-op Assessment: Post-op Vital signs reviewed, Patient's Cardiovascular Status Stable, Respiratory Function Stable, Patent Airway and Pain level controlled  Post-op Vital Signs: stable  Last Vitals:  Filed Vitals:   09/24/14 0500  BP: 90/50  Pulse: 74  Temp: 36.8 C  Resp: 16    Complications: No apparent anesthesia complications

## 2014-10-13 NOTE — Discharge Summary (Signed)
Patient ID: [name] [name] MRN: [id number] DOB/AGE: 1965-11-03 49 y.o.  Admit date: 09/23/2014 Discharge date: 10/13/2014  Admission Diagnoses:  Active Problems:   Spinal stenosis of lumbar region with neurogenic claudication   Herniated nucleus pulposus, lumbar   History of lumbar laminectomy for spinal cord decompression   Discharge Diagnoses:  Active Problems:   Spinal stenosis of lumbar region with neurogenic claudication   Herniated nucleus pulposus, lumbar   History of lumbar laminectomy for spinal cord decompression  status post Procedure(s): RIGHT L4-5 AND L5-S1 MICRODISCECTOMIES  Past Medical History  Diagnosis Date  . Adrenal gland hypofunction     related to post op, vancomycin & lumbar injection, steroids    . Depression   . Cancer     "early evolvng melanoma "- legs   . Arthritis     hnp-lumbar   . PONV (postoperative nausea and vomiting)     last 2 surgeries less nausea  . PONV (postoperative nausea and vomiting)     none with last surgery  . Blood dyscrasia     hx probable blood clot after ankle surgery  not definitive but treated per pt  . High cholesterol   . Heart murmur     years ago  . Pneumonia ~ 2009; 2010  . History of bronchitis   . History of blood transfusion     "16 so far" (07/25/2012)  . 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 Migraines   . Hypertension     takes HCTZ  . History of UTI   . Family history of adverse reaction to anesthesia     mom has N/V - zofran works    Surgeries: Procedure(s): RIGHT L4-5 AND L5-S1 MICRODISCECTOMIES on 09/23/2014   Consultants:    Discharged Condition: Improved  Hospital Course: [name] [name] is an 49 y.o. female who was admitted 09/23/2014 for operative treatment of lumbar ddd/hnp. Patient failed conservative treatments (please see the history and physical for the specifics) and had severe unremitting pain that affects sleep, daily activities and work/hobbies. After pre-op  clearance, the patient was taken to the operating room on 09/23/2014 and underwent  Procedure(s): RIGHT L4-5 AND L5-S1 MICRODISCECTOMIES.    Patient was given perioperative antibiotics:  Anti-infectives    Start     Dose/Rate Route Frequency Ordered Stop   09/23/14 0600  vancomycin (VANCOCIN) IVPB 1000 mg/200 mL premix     1,000 mg200 mL/hr over 60 Minutes Intravenous On call to O.R. 09/22/14 1348 09/23/14 1615       Patient was given sequential compression devices and early ambulation to prevent DVT.   Patient benefited maximally from hospital stay and there were no complications. At the time of discharge, the patient was urinating/moving their bowels without difficulty, tolerating a regular diet, pain is controlled with oral pain medications and they have been cleared by PT/OT.   Recent vital signs: No data found.    Recent laboratory studies: No results for input(s): WBC, HGB, HCT, PLT, NA, K, CL, CO2, BUN, CREATININE, GLUCOSE, INR, CALCIUM in the last 72 hours.  Invalid input(s): PT, 2   Discharge Medications:     Medication List    STOP taking these medications        buPROPion 150 MG 12 hr tablet  Commonly known as:  WELLBUTRIN SR     cyclobenzaprine 10 MG tablet  Commonly known as:  FLEXERIL     cyclobenzaprine 5 MG tablet  Commonly known as:  FLEXERIL  gabapentin 300 MG capsule  Commonly known as:  NEURONTIN     HYDROmorphone 4 MG tablet  Commonly known as:  DILAUDID     meloxicam 15 MG tablet  Commonly known as:  MOBIC     methylPREDNIsolone 4 MG tablet  Commonly known as:  MEDROL DOSPACK     mometasone 50 MCG/ACT nasal spray  Commonly known as:  NASONEX     morphine 15 MG tablet  Commonly known as:  MSIR     oxyCODONE 15 MG immediate release tablet  Commonly known as:  ROXICODONE     oxyCODONE 20 MG 12 hr tablet  Commonly known as:  OXYCONTIN     OxyCODONE 20 mg T12a 12 hr tablet  Commonly known as:  OXYCONTIN     oxyCODONE-acetaminophen  5-325 MG per tablet  Commonly known as:  PERCOCET/ROXICET  Replaced by:  oxyCODONE-acetaminophen 10-325 MG per tablet  You also have another medication with the same name that you need to continue taking as instructed.     zaleplon 10 MG capsule  Commonly known as:  SONATA      TAKE these medications        b complex vitamins tablet  Take 1 tablet by mouth daily.     escitalopram 20 MG tablet  Commonly known as:  LEXAPRO  Take 20 mg by mouth daily before breakfast.     hydrochlorothiazide 25 MG tablet  Commonly known as:  HYDRODIURIL  Take 25 mg by mouth daily.     methocarbamol 500 MG tablet  Commonly known as:  ROBAXIN  Take 1 tablet (500 mg total) by mouth 4 (four) times daily.     ondansetron 4 MG disintegrating tablet  Commonly known as:  ZOFRAN ODT  Take 1 tablet (4 mg total) by mouth every 8 (eight) hours as needed.     oxyCODONE-acetaminophen 10-325 MG per tablet  Commonly known as:  PERCOCET  Take 1 tablet by mouth every 6 (six) hours as needed.     oxyCODONE-acetaminophen 5-325 MG per tablet  Commonly known as:  PERCOCET/ROXICET  Take 1-2 tablets by mouth every 4 (four) hours as needed for moderate pain.     potassium chloride SA 20 MEQ tablet  Commonly known as:  K-DUR,KLOR-CON  Take 1 tablet (20 mEq total) by mouth daily.     rosuvastatin 20 MG tablet  Commonly known as:  CRESTOR  Take 20 mg by mouth daily.     topiramate 50 MG tablet  Commonly known as:  TOPAMAX  Take 150 mg by mouth daily.        Diagnostic Studies: Dg Chest 2 View  09/23/2014   CLINICAL DATA:  Preop lumbar HNP.  History of hypertension.  EXAM: CHEST  2 VIEW  COMPARISON:  02/25/2014  FINDINGS: Heart and mediastinal contours are within normal limits. No focal opacities or effusions. No acute bony abnormality. Nodular density previously questioned in the left upper lobe not visualized on today's study.  IMPRESSION: No active cardiopulmonary disease.   Electronically Signed   By:  Rolm Baptise M.D.   On: 09/23/2014 12:49   Dg Lumbar Spine 2-3 Views  09/23/2014   CLINICAL DATA:  L4-S1 microdiscectomy.  EXAM: LUMBAR SPINE - 2-3 VIEW; DG C-ARM 61-120 MIN  COMPARISON:  None.  FINDINGS: Three intraoperative spot views of the lower lumbar spine are submitted postoperatively for interpretation.  Image 1 demonstrates a posterior metallic probe with tip overlying the L5-S1 interspace.  Image 2 demonstrates a  posterior metallic probe with tip overlying the L5-S1 interspace.  Imaged 3 demonstrates a posterior metallic probe overlying the L5 spinous process directed at the upper L5 vertebral body.  IMPRESSION: Haematologist Signed   By: Hassan Rowan M.D.   On: 09/23/2014 18:57   Dg C-arm 61-120 Min  09/23/2014   CLINICAL DATA:  L4-S1 microdiscectomy.  EXAM: LUMBAR SPINE - 2-3 VIEW; DG C-ARM 61-120 MIN  COMPARISON:  None.  FINDINGS: Three intraoperative spot views of the lower lumbar spine are submitted postoperatively for interpretation.  Image 1 demonstrates a posterior metallic probe with tip overlying the L5-S1 interspace.  Image 2 demonstrates a posterior metallic probe with tip overlying the L5-S1 interspace.  Imaged 3 demonstrates a posterior metallic probe overlying the L5 spinous process directed at the upper L5 vertebral body.  IMPRESSION: Haematologist Signed   By: Hassan Rowan M.D.   On: 09/23/2014 18:57        Discharge Instructions    Call MD / Call 911    Complete by:  As directed   If you experience chest pain or shortness of breath, CALL 911 and be transported to the hospital emergency room.  If you develope a fever above 101 F, pus (white drainage) or increased drainage or redness at the wound, or calf pain, call your surgeon's office.     Constipation Prevention    Complete by:  As directed   Drink plenty of fluids.  Prune juice may be helpful.  You may use a stool softener, such as Colace (over the counter) 100 mg twice a day.  Use MiraLax (over  the counter) for constipation as needed.     Diet - low sodium heart healthy    Complete by:  As directed      Discharge instructions    Complete by:  As directed   No lifting greater than 10 lbs. Avoid bending, stooping and twisting. Walk in house for first week them may start to get out slowly increasing distance up to one mile by 3 weeks post op. Keep incision dry for 3 days, may use tegaderm or similar water impervious dressing.     Driving restrictions    Complete by:  As directed   No driving for 4 weeks     Increase activity slowly as tolerated    Complete by:  As directed      Lifting restrictions    Complete by:  As directed   No lifting for 6 weeks           Follow-up Information    Follow up with NITKA,Terik Haughey E, MD In 2 weeks.   Specialty:  Orthopedic Surgery   Contact information:   [place] [address] [phone]       Discharge Plan:  discharge to home      Signed: Lanae Crumbly

## 2015-06-16 ENCOUNTER — Other Ambulatory Visit: Payer: Self-pay | Admitting: Physician Assistant

## 2015-10-02 ENCOUNTER — Other Ambulatory Visit: Payer: Self-pay | Admitting: Specialist

## 2015-10-02 DIAGNOSIS — M21371 Foot drop, right foot: Secondary | ICD-10-CM

## 2015-10-02 DIAGNOSIS — M5432 Sciatica, left side: Secondary | ICD-10-CM

## 2015-10-02 DIAGNOSIS — M5431 Sciatica, right side: Secondary | ICD-10-CM

## 2015-10-02 DIAGNOSIS — M79606 Pain in leg, unspecified: Secondary | ICD-10-CM

## 2015-10-05 ENCOUNTER — Ambulatory Visit
Admission: RE | Admit: 2015-10-05 | Discharge: 2015-10-05 | Disposition: A | Discharge status: Home / Self Care | Payer: BLUE CROSS/BLUE SHIELD | Source: Ambulatory Visit | Attending: Specialist | Admitting: Specialist

## 2015-10-05 DIAGNOSIS — M5431 Sciatica, right side: Secondary | ICD-10-CM

## 2015-10-05 DIAGNOSIS — M79606 Pain in leg, unspecified: Secondary | ICD-10-CM

## 2015-10-05 DIAGNOSIS — M5432 Sciatica, left side: Secondary | ICD-10-CM

## 2015-10-05 DIAGNOSIS — M21371 Foot drop, right foot: Secondary | ICD-10-CM

## 2015-10-05 MED ORDER — GADOBENATE DIMEGLUMINE 529 MG/ML IV SOLN
13.0000 mL | Freq: Once | INTRAVENOUS | Status: AC | PRN
  Administered 2015-10-05: 13 mL via INTRAVENOUS

## 2015-10-13 ENCOUNTER — Other Ambulatory Visit (HOSPITAL_COMMUNITY): Payer: Self-pay | Admitting: Specialist

## 2015-10-14 ENCOUNTER — Other Ambulatory Visit (HOSPITAL_COMMUNITY): Payer: Self-pay | Admitting: *Deleted

## 2015-10-14 NOTE — Pre-Procedure Instructions (Signed)
[name] [name]  10/14/2015     Your procedure is scheduled on Friday October 16, 2015 at 3:35 PM.  Report to [facility] Admitting at 1:30 PM   Call this number if you have problems the morning of surgery: [phone]    Remember:  Do not eat food or drink liquids after midnight.   Take these medicines the morning of surgery with A SIP OF WATER: Escitalopram (Lexapro), Morphine if needed,Topiramate (Topamax)   Do not wear jewelry, make-up or nail polish.  Do not wear lotions, powders, or perfumes.    Do not shave 48 hours prior to surgery.   [facility] is not responsible for any belongings or valuables.  Contacts, dentures or bridgework may not be worn into surgery.  Leave your suitcase in the car.  After surgery it may be brought to your room.  For patients admitted to the hospital, discharge time will be determined by your treatment team.  Patients discharged the day of surgery will not be allowed to drive home.   Special instructions:  Shower using CHG soap the night before and the morning of your surgery  Please read over the following fact sheets that you were given. Pain Booklet, Coughing and Deep Breathing and Surgical Site Infection Prevention

## 2015-10-15 ENCOUNTER — Encounter (HOSPITAL_COMMUNITY)
Admission: RE | Admit: 2015-10-15 | Discharge: 2015-10-15 | Disposition: A | Discharge status: Home / Self Care | Payer: BLUE CROSS/BLUE SHIELD | Source: Ambulatory Visit | Attending: Specialist | Admitting: Specialist

## 2015-10-15 ENCOUNTER — Encounter (HOSPITAL_COMMUNITY): Payer: Self-pay

## 2015-10-15 DIAGNOSIS — F329 Major depressive disorder, single episode, unspecified: Secondary | ICD-10-CM | POA: Diagnosis not present

## 2015-10-15 DIAGNOSIS — Z01818 Encounter for other preprocedural examination: Secondary | ICD-10-CM | POA: Insufficient documentation

## 2015-10-15 DIAGNOSIS — R2681 Unsteadiness on feet: Secondary | ICD-10-CM | POA: Diagnosis not present

## 2015-10-15 DIAGNOSIS — M4802 Spinal stenosis, cervical region: Secondary | ICD-10-CM | POA: Diagnosis not present

## 2015-10-15 DIAGNOSIS — Z79899 Other long term (current) drug therapy: Secondary | ICD-10-CM | POA: Diagnosis not present

## 2015-10-15 DIAGNOSIS — E876 Hypokalemia: Secondary | ICD-10-CM | POA: Diagnosis not present

## 2015-10-15 DIAGNOSIS — Z792 Long term (current) use of antibiotics: Secondary | ICD-10-CM | POA: Diagnosis not present

## 2015-10-15 DIAGNOSIS — Z01812 Encounter for preprocedural laboratory examination: Secondary | ICD-10-CM | POA: Insufficient documentation

## 2015-10-15 DIAGNOSIS — M5126 Other intervertebral disc displacement, lumbar region: Secondary | ICD-10-CM | POA: Diagnosis present

## 2015-10-15 DIAGNOSIS — I10 Essential (primary) hypertension: Secondary | ICD-10-CM | POA: Diagnosis not present

## 2015-10-15 DIAGNOSIS — M5116 Intervertebral disc disorders with radiculopathy, lumbar region: Secondary | ICD-10-CM

## 2015-10-15 DIAGNOSIS — R05 Cough: Secondary | ICD-10-CM | POA: Insufficient documentation

## 2015-10-15 DIAGNOSIS — E7800 Pure hypercholesterolemia, unspecified: Secondary | ICD-10-CM | POA: Diagnosis not present

## 2015-10-15 DIAGNOSIS — M4806 Spinal stenosis, lumbar region: Secondary | ICD-10-CM | POA: Diagnosis not present

## 2015-10-15 DIAGNOSIS — E2740 Unspecified adrenocortical insufficiency: Secondary | ICD-10-CM | POA: Diagnosis not present

## 2015-10-15 LAB — CBC
HEMATOCRIT: 39.7 % (ref 36.0–46.0)
HEMOGLOBIN: 12.7 g/dL (ref 12.0–15.0)
MCH: 31.4 pg (ref 26.0–34.0)
MCHC: 32 g/dL (ref 30.0–36.0)
MCV: 98 fL (ref 78.0–100.0)
Platelets: 230 10*3/uL (ref 150–400)
RBC: 4.05 MIL/uL (ref 3.87–5.11)
RDW: 13.4 % (ref 11.5–15.5)
WBC: 4.8 10*3/uL (ref 4.0–10.5)

## 2015-10-15 LAB — COMPREHENSIVE METABOLIC PANEL
ALBUMIN: 3.5 g/dL (ref 3.5–5.0)
ALK PHOS: 71 U/L (ref 38–126)
ALT: 60 U/L — AB (ref 14–54)
ANION GAP: 5 (ref 5–15)
AST: 42 U/L — AB (ref 15–41)
BUN: 8 mg/dL (ref 6–20)
CALCIUM: 8.9 mg/dL (ref 8.9–10.3)
CHLORIDE: 107 mmol/L (ref 101–111)
CO2: 29 mmol/L (ref 22–32)
CREATININE: 0.72 mg/dL (ref 0.44–1.00)
Glucose, Bld: 99 mg/dL (ref 65–99)
Potassium: 3.7 mmol/L (ref 3.5–5.1)
SODIUM: 141 mmol/L (ref 135–145)
Total Bilirubin: 0.4 mg/dL (ref 0.3–1.2)
Total Protein: 5.8 g/dL — ABNORMAL LOW (ref 6.5–8.1)

## 2015-10-15 LAB — SURGICAL PCR SCREEN
MRSA, PCR: NEGATIVE
STAPHYLOCOCCUS AUREUS: NEGATIVE

## 2015-10-15 MED ORDER — VANCOMYCIN HCL IN DEXTROSE 1-5 GM/200ML-% IV SOLN
1000.0000 mg | INTRAVENOUS | Status: AC
  Administered 2015-10-16: 1000 mg via INTRAVENOUS
  Filled 2015-10-15: qty 200

## 2015-10-15 MED ORDER — CHLORHEXIDINE GLUCONATE 4 % EX LIQD: 60.0000 mL | Freq: Once | CUTANEOUS | Status: DC

## 2015-10-15 NOTE — Progress Notes (Signed)
PCP is Ravisankar Avva  Patient denied having any acute cardiac or pulmonary issues. Patient informed Nurse that her physician recently discontinued her blood pressure medication HCTZ as her blood pressure has been "fine."

## 2015-10-16 ENCOUNTER — Ambulatory Visit (HOSPITAL_COMMUNITY): Payer: BLUE CROSS/BLUE SHIELD | Admitting: Anesthesiology

## 2015-10-16 ENCOUNTER — Encounter (HOSPITAL_COMMUNITY)
Admission: RE | Disposition: A | Discharge status: Home Health | Payer: BLUE CROSS/BLUE SHIELD | Source: Ambulatory Visit | Attending: Specialist

## 2015-10-16 ENCOUNTER — Encounter (HOSPITAL_COMMUNITY): Payer: Self-pay | Admitting: Surgery

## 2015-10-16 ENCOUNTER — Observation Stay (HOSPITAL_COMMUNITY)
Admission: RE | Admit: 2015-10-16 | Discharge: 2015-10-21 | Disposition: A | Discharge status: Home Health | Payer: BLUE CROSS/BLUE SHIELD | Source: Ambulatory Visit | Attending: Specialist | Admitting: Specialist

## 2015-10-16 ENCOUNTER — Ambulatory Visit (HOSPITAL_COMMUNITY): Payer: BLUE CROSS/BLUE SHIELD

## 2015-10-16 DIAGNOSIS — M4802 Spinal stenosis, cervical region: Secondary | ICD-10-CM | POA: Insufficient documentation

## 2015-10-16 DIAGNOSIS — R2681 Unsteadiness on feet: Secondary | ICD-10-CM | POA: Insufficient documentation

## 2015-10-16 DIAGNOSIS — E2740 Unspecified adrenocortical insufficiency: Secondary | ICD-10-CM | POA: Insufficient documentation

## 2015-10-16 DIAGNOSIS — E7800 Pure hypercholesterolemia, unspecified: Secondary | ICD-10-CM | POA: Insufficient documentation

## 2015-10-16 DIAGNOSIS — M7989 Other specified soft tissue disorders: Secondary | ICD-10-CM

## 2015-10-16 DIAGNOSIS — Z9889 Other specified postprocedural states: Secondary | ICD-10-CM

## 2015-10-16 DIAGNOSIS — M5126 Other intervertebral disc displacement, lumbar region: Secondary | ICD-10-CM | POA: Diagnosis not present

## 2015-10-16 DIAGNOSIS — F329 Major depressive disorder, single episode, unspecified: Secondary | ICD-10-CM | POA: Insufficient documentation

## 2015-10-16 DIAGNOSIS — E876 Hypokalemia: Secondary | ICD-10-CM | POA: Insufficient documentation

## 2015-10-16 DIAGNOSIS — Z79899 Other long term (current) drug therapy: Secondary | ICD-10-CM | POA: Insufficient documentation

## 2015-10-16 DIAGNOSIS — R29898 Other symptoms and signs involving the musculoskeletal system: Secondary | ICD-10-CM

## 2015-10-16 DIAGNOSIS — Z792 Long term (current) use of antibiotics: Secondary | ICD-10-CM | POA: Insufficient documentation

## 2015-10-16 DIAGNOSIS — E236 Other disorders of pituitary gland: Secondary | ICD-10-CM | POA: Diagnosis present

## 2015-10-16 DIAGNOSIS — M4806 Spinal stenosis, lumbar region: Secondary | ICD-10-CM | POA: Insufficient documentation

## 2015-10-16 DIAGNOSIS — R261 Paralytic gait: Secondary | ICD-10-CM

## 2015-10-16 DIAGNOSIS — Z419 Encounter for procedure for purposes other than remedying health state, unspecified: Secondary | ICD-10-CM

## 2015-10-16 DIAGNOSIS — E237 Disorder of pituitary gland, unspecified: Secondary | ICD-10-CM

## 2015-10-16 DIAGNOSIS — I10 Essential (primary) hypertension: Secondary | ICD-10-CM | POA: Insufficient documentation

## 2015-10-16 HISTORY — PX: LUMBAR LAMINECTOMY: SHX95

## 2015-10-16 LAB — URINALYSIS, ROUTINE W REFLEX MICROSCOPIC
BILIRUBIN URINE: NEGATIVE
GLUCOSE, UA: NEGATIVE mg/dL
Hgb urine dipstick: NEGATIVE
KETONES UR: NEGATIVE mg/dL
LEUKOCYTES UA: NEGATIVE
NITRITE: NEGATIVE
PH: 5 (ref 5.0–8.0)
Protein, ur: NEGATIVE mg/dL
SPECIFIC GRAVITY, URINE: 1.015 (ref 1.005–1.030)

## 2015-10-16 SURGERY — MICRODISCECTOMY LUMBAR LAMINECTOMY
Anesthesia: General

## 2015-10-16 MED ORDER — THROMBIN 20000 UNITS EX SOLR
CUTANEOUS | Status: AC
  Filled 2015-10-16: qty 20000

## 2015-10-16 MED ORDER — BUPIVACAINE HCL 0.5 % IJ SOLN
INTRAMUSCULAR | Status: DC | PRN
  Administered 2015-10-16: 9 mL

## 2015-10-16 MED ORDER — ROSUVASTATIN CALCIUM 10 MG PO TABS
20.0000 mg | ORAL_TABLET | Freq: Every day | ORAL | Status: DC
  Administered 2015-10-16 – 2015-10-20 (×5): 20 mg via ORAL
  Filled 2015-10-16 (×5): qty 2

## 2015-10-16 MED ORDER — SUCCINYLCHOLINE CHLORIDE 20 MG/ML IJ SOLN
INTRAMUSCULAR | Status: DC | PRN
  Administered 2015-10-16: 40 mg via INTRAVENOUS

## 2015-10-16 MED ORDER — METHOCARBAMOL 1000 MG/10ML IJ SOLN
500.0000 mg | Freq: Four times a day (QID) | INTRAVENOUS | Status: DC | PRN
  Filled 2015-10-16: qty 5

## 2015-10-16 MED ORDER — SODIUM CHLORIDE 0.9 % IJ SOLN
3.0000 mL | Freq: Two times a day (BID) | INTRAMUSCULAR | Status: DC
  Administered 2015-10-17 – 2015-10-21 (×6): 3 mL via INTRAVENOUS

## 2015-10-16 MED ORDER — ARTIFICIAL TEARS OP OINT
TOPICAL_OINTMENT | OPHTHALMIC | Status: DC | PRN
Start: 2015-10-16 — End: 2015-10-16
  Administered 2015-10-16: 1 via OPHTHALMIC

## 2015-10-16 MED ORDER — ESCITALOPRAM OXALATE 10 MG PO TABS
20.0000 mg | ORAL_TABLET | Freq: Every day | ORAL | Status: DC
  Administered 2015-10-17 – 2015-10-21 (×5): 20 mg via ORAL
  Filled 2015-10-16 (×5): qty 2

## 2015-10-16 MED ORDER — HYDROCORTISONE NA SUCCINATE PF 100 MG IJ SOLR
100.0000 mg | Freq: Three times a day (TID) | INTRAMUSCULAR | Status: AC
  Administered 2015-10-16: 100 mg via INTRAVENOUS
  Filled 2015-10-16 (×2): qty 2

## 2015-10-16 MED ORDER — PROMETHAZINE HCL 25 MG/ML IJ SOLN: 6.2500 mg | Freq: Four times a day (QID) | INTRAMUSCULAR | Status: DC | PRN

## 2015-10-16 MED ORDER — PHENYLEPHRINE HCL 10 MG/ML IJ SOLN
INTRAMUSCULAR | Status: DC | PRN
  Administered 2015-10-16: 40 ug via INTRAVENOUS
  Administered 2015-10-16: 120 ug via INTRAVENOUS
  Administered 2015-10-16: 40 ug via INTRAVENOUS
  Administered 2015-10-16: 80 ug via INTRAVENOUS
  Administered 2015-10-16 (×2): 40 ug via INTRAVENOUS
  Administered 2015-10-16: 80 ug via INTRAVENOUS

## 2015-10-16 MED ORDER — HYDROMORPHONE HCL 1 MG/ML IJ SOLN
INTRAMUSCULAR | Status: AC
  Filled 2015-10-16: qty 1

## 2015-10-16 MED ORDER — ONDANSETRON HCL 4 MG/2ML IJ SOLN: 4.0000 mg | Freq: Once | INTRAMUSCULAR | Status: DC | PRN

## 2015-10-16 MED ORDER — ONDANSETRON HCL 4 MG/2ML IJ SOLN
INTRAMUSCULAR | Status: DC | PRN
  Administered 2015-10-16: 4 mg via INTRAVENOUS

## 2015-10-16 MED ORDER — LIDOCAINE HCL (CARDIAC) 20 MG/ML IV SOLN
INTRAVENOUS | Status: DC | PRN
  Administered 2015-10-16: 100 mg via INTRAVENOUS

## 2015-10-16 MED ORDER — ROCURONIUM BROMIDE 100 MG/10ML IV SOLN
INTRAVENOUS | Status: DC | PRN
  Administered 2015-10-16: 25 mg via INTRAVENOUS

## 2015-10-16 MED ORDER — POLYETHYLENE GLYCOL 3350 17 G PO PACK: 17.0000 g | PACK | Freq: Every day | ORAL | Status: DC | PRN

## 2015-10-16 MED ORDER — HYDROCORTISONE NA SUCCINATE PF 100 MG IJ SOLR
INTRAMUSCULAR | Status: AC
  Filled 2015-10-16: qty 2

## 2015-10-16 MED ORDER — ACETAMINOPHEN 325 MG PO TABS
650.0000 mg | ORAL_TABLET | ORAL | Status: DC | PRN
  Administered 2015-10-18 – 2015-10-20 (×3): 650 mg via ORAL
  Filled 2015-10-16 (×3): qty 2

## 2015-10-16 MED ORDER — HYDROMORPHONE HCL 1 MG/ML IJ SOLN: 0.0250 mg | INTRAMUSCULAR | Status: DC | PRN

## 2015-10-16 MED ORDER — MIDAZOLAM HCL 5 MG/5ML IJ SOLN
INTRAMUSCULAR | Status: DC | PRN
  Administered 2015-10-16: 2 mg via INTRAVENOUS

## 2015-10-16 MED ORDER — POTASSIUM CHLORIDE IN NACL 20-0.45 MEQ/L-% IV SOLN
INTRAVENOUS | Status: DC
  Administered 2015-10-16: 22:00:00 via INTRAVENOUS
  Filled 2015-10-16 (×10): qty 1000

## 2015-10-16 MED ORDER — BUPIVACAINE LIPOSOME 1.3 % IJ SUSP
20.0000 mL | INTRAMUSCULAR | Status: DC
  Filled 2015-10-16: qty 20

## 2015-10-16 MED ORDER — 0.9 % SODIUM CHLORIDE (POUR BTL) OPTIME
TOPICAL | Status: DC | PRN
  Administered 2015-10-16: 1000 mL

## 2015-10-16 MED ORDER — SODIUM CHLORIDE 0.9 % IJ SOLN: 3.0000 mL | INTRAMUSCULAR | Status: DC | PRN

## 2015-10-16 MED ORDER — FENTANYL CITRATE (PF) 100 MCG/2ML IJ SOLN
INTRAMUSCULAR | Status: DC | PRN
Start: 2015-10-16 — End: 2015-10-16
  Administered 2015-10-16: 150 ug via INTRAVENOUS

## 2015-10-16 MED ORDER — LACTATED RINGERS IV SOLN
INTRAVENOUS | Status: DC
  Administered 2015-10-16: 15:00:00 via INTRAVENOUS

## 2015-10-16 MED ORDER — SODIUM CHLORIDE 0.9 % IV SOLN: 250.0000 mL | INTRAVENOUS | Status: DC

## 2015-10-16 MED ORDER — OXYCODONE HCL 5 MG PO TABS
ORAL_TABLET | ORAL | Status: AC
  Filled 2015-10-16: qty 2

## 2015-10-16 MED ORDER — LACTATED RINGERS IV SOLN
INTRAVENOUS | Status: DC | PRN
  Administered 2015-10-16 (×3): via INTRAVENOUS

## 2015-10-16 MED ORDER — METHOCARBAMOL 500 MG PO TABS
500.0000 mg | ORAL_TABLET | Freq: Four times a day (QID) | ORAL | Status: DC | PRN
  Administered 2015-10-16 – 2015-10-19 (×7): 500 mg via ORAL
  Filled 2015-10-16 (×6): qty 1

## 2015-10-16 MED ORDER — ALPRAZOLAM 0.5 MG PO TABS
0.5000 mg | ORAL_TABLET | Freq: Every evening | ORAL | Status: DC | PRN
  Filled 2015-10-16: qty 1

## 2015-10-16 MED ORDER — PROPOFOL 10 MG/ML IV BOLUS
INTRAVENOUS | Status: AC
  Filled 2015-10-16: qty 20

## 2015-10-16 MED ORDER — HYDROMORPHONE HCL 1 MG/ML IJ SOLN
1.0000 mg | INTRAMUSCULAR | Status: DC | PRN
  Administered 2015-10-16 – 2015-10-21 (×23): 1 mg via INTRAVENOUS
  Filled 2015-10-16 (×23): qty 1

## 2015-10-16 MED ORDER — HYDROMORPHONE HCL 1 MG/ML IJ SOLN
0.5000 mg | INTRAMUSCULAR | Status: DC | PRN
  Administered 2015-10-16 (×3): 0.5 mg via INTRAVENOUS

## 2015-10-16 MED ORDER — METHOCARBAMOL 500 MG PO TABS
ORAL_TABLET | ORAL | Status: AC
  Filled 2015-10-16: qty 1

## 2015-10-16 MED ORDER — ONDANSETRON HCL 4 MG/2ML IJ SOLN
INTRAMUSCULAR | Status: AC
  Filled 2015-10-16: qty 2

## 2015-10-16 MED ORDER — MENTHOL 3 MG MT LOZG: 1.0000 | LOZENGE | OROMUCOSAL | Status: DC | PRN

## 2015-10-16 MED ORDER — NITROFURANTOIN MONOHYD MACRO 100 MG PO CAPS
100.0000 mg | ORAL_CAPSULE | Freq: Every day | ORAL | Status: DC
  Administered 2015-10-16 – 2015-10-21 (×5): 100 mg via ORAL
  Filled 2015-10-16 (×6): qty 1

## 2015-10-16 MED ORDER — BUPIVACAINE HCL (PF) 0.5 % IJ SOLN
INTRAMUSCULAR | Status: AC
  Filled 2015-10-16: qty 30

## 2015-10-16 MED ORDER — PHENOL 1.4 % MT LIQD: 1.0000 | OROMUCOSAL | Status: DC | PRN

## 2015-10-16 MED ORDER — HYDROCORTISONE NA SUCCINATE PF 100 MG IJ SOLR
INTRAMUSCULAR | Status: DC | PRN
  Administered 2015-10-16: 100 mg via INTRAVENOUS

## 2015-10-16 MED ORDER — BUPIVACAINE LIPOSOME 1.3 % IJ SUSP
INTRAMUSCULAR | Status: DC | PRN
  Administered 2015-10-16: 9 mL

## 2015-10-16 MED ORDER — MEPERIDINE HCL 25 MG/ML IJ SOLN: 6.2500 mg | INTRAMUSCULAR | Status: DC | PRN

## 2015-10-16 MED ORDER — MIDAZOLAM HCL 2 MG/2ML IJ SOLN
INTRAMUSCULAR | Status: AC
  Filled 2015-10-16: qty 2

## 2015-10-16 MED ORDER — OXYCODONE HCL 5 MG PO TABS
10.0000 mg | ORAL_TABLET | ORAL | Status: DC | PRN
  Administered 2015-10-16 – 2015-10-17 (×2): 10 mg via ORAL
  Filled 2015-10-16: qty 2

## 2015-10-16 MED ORDER — EPHEDRINE SULFATE 50 MG/ML IJ SOLN
INTRAMUSCULAR | Status: DC | PRN
  Administered 2015-10-16 (×2): 5 mg via INTRAVENOUS
  Administered 2015-10-16: 10 mg via INTRAVENOUS
  Administered 2015-10-16: 5 mg via INTRAVENOUS
  Administered 2015-10-16: 10 mg via INTRAVENOUS

## 2015-10-16 MED ORDER — OXYCODONE-ACETAMINOPHEN 5-325 MG PO TABS: 1.0000 | ORAL_TABLET | Freq: Three times a day (TID) | ORAL | Status: DC | PRN

## 2015-10-16 MED ORDER — ONDANSETRON HCL 4 MG/2ML IJ SOLN
4.0000 mg | INTRAMUSCULAR | Status: DC | PRN
  Administered 2015-10-17 – 2015-10-20 (×3): 4 mg via INTRAVENOUS
  Filled 2015-10-16 (×3): qty 2

## 2015-10-16 MED ORDER — DOCUSATE SODIUM 100 MG PO CAPS
100.0000 mg | ORAL_CAPSULE | Freq: Two times a day (BID) | ORAL | Status: DC
  Administered 2015-10-16 – 2015-10-21 (×10): 100 mg via ORAL
  Filled 2015-10-16 (×10): qty 1

## 2015-10-16 MED ORDER — NEOSTIGMINE METHYLSULFATE 10 MG/10ML IV SOLN
INTRAVENOUS | Status: DC | PRN
  Administered 2015-10-16: 2.5 mg via INTRAVENOUS

## 2015-10-16 MED ORDER — FENTANYL CITRATE (PF) 250 MCG/5ML IJ SOLN
INTRAMUSCULAR | Status: AC
  Filled 2015-10-16: qty 5

## 2015-10-16 MED ORDER — ACETAMINOPHEN 650 MG RE SUPP
650.0000 mg | RECTAL | Status: DC | PRN
Start: 2015-10-16 — End: 2015-10-21

## 2015-10-16 MED ORDER — PROPOFOL 10 MG/ML IV BOLUS
INTRAVENOUS | Status: DC | PRN
  Administered 2015-10-16: 200 mg via INTRAVENOUS

## 2015-10-16 MED ORDER — THROMBIN 20000 UNITS EX KIT
PACK | CUTANEOUS | Status: DC | PRN
  Administered 2015-10-16: 16:00:00 via TOPICAL

## 2015-10-16 MED ORDER — GLYCOPYRROLATE 0.2 MG/ML IJ SOLN
INTRAMUSCULAR | Status: DC | PRN
  Administered 2015-10-16: 0.4 mg via INTRAVENOUS

## 2015-10-16 MED ORDER — AMPHETAMINE-DEXTROAMPHETAMINE 10 MG PO TABS
20.0000 mg | ORAL_TABLET | Freq: Every day | ORAL | Status: DC
  Administered 2015-10-19 – 2015-10-21 (×2): 20 mg via ORAL
  Filled 2015-10-16 (×3): qty 2

## 2015-10-16 MED ORDER — THROMBIN 20000 UNITS EX KIT: PACK | CUTANEOUS | Status: DC | PRN

## 2015-10-16 MED ORDER — TOPIRAMATE 25 MG PO TABS
150.0000 mg | ORAL_TABLET | Freq: Every day | ORAL | Status: DC
  Administered 2015-10-17 – 2015-10-21 (×5): 150 mg via ORAL
  Filled 2015-10-16 (×5): qty 2

## 2015-10-16 SURGICAL SUPPLY — 49 items
BUR ROUND FLUTED 4 SOFT TCH (BURR) IMPLANT
BUR SABER RD CUTTING 3.0 (BURR) ×2 IMPLANT
CANISTER SUCTION 2500CC (MISCELLANEOUS) ×2 IMPLANT
COVER MAYO STAND STRL (DRAPES) ×2 IMPLANT
COVER SURGICAL LIGHT HANDLE (MISCELLANEOUS) ×2 IMPLANT
DERMABOND ADVANCED (GAUZE/BANDAGES/DRESSINGS) ×1
DERMABOND ADVANCED .7 DNX12 (GAUZE/BANDAGES/DRESSINGS) ×1 IMPLANT
DRAPE C-ARM 42X72 X-RAY (DRAPES) IMPLANT
DRAPE MICROSCOPE LEICA (MISCELLANEOUS) ×2 IMPLANT
DRAPE PROXIMA HALF (DRAPES) IMPLANT
DRAPE SURG 17X23 STRL (DRAPES) ×8 IMPLANT
DRSG MEPILEX BORDER 4X4 (GAUZE/BANDAGES/DRESSINGS) ×2 IMPLANT
DRSG MEPILEX BORDER 4X8 (GAUZE/BANDAGES/DRESSINGS) IMPLANT
DURAPREP 26ML APPLICATOR (WOUND CARE) ×2 IMPLANT
ELECT BLADE 4.0 EZ CLEAN MEGAD (MISCELLANEOUS) ×2
ELECT CAUTERY BLADE 6.4 (BLADE) ×2 IMPLANT
ELECT REM PT RETURN 9FT ADLT (ELECTROSURGICAL) ×2
ELECTRODE BLDE 4.0 EZ CLN MEGD (MISCELLANEOUS) ×1 IMPLANT
ELECTRODE REM PT RTRN 9FT ADLT (ELECTROSURGICAL) ×1 IMPLANT
GLOVE BIOGEL PI IND STRL 8 (GLOVE) ×1 IMPLANT
GLOVE BIOGEL PI INDICATOR 8 (GLOVE) ×1
GLOVE ECLIPSE 8.5 STRL (GLOVE) ×2 IMPLANT
GLOVE ORTHO TXT STRL SZ7.5 (GLOVE) ×2 IMPLANT
GLOVE SURG 8.5 LATEX PF (GLOVE) ×2 IMPLANT
GOWN STRL REUS W/ TWL LRG LVL3 (GOWN DISPOSABLE) ×1 IMPLANT
GOWN STRL REUS W/TWL 2XL LVL3 (GOWN DISPOSABLE) ×4 IMPLANT
GOWN STRL REUS W/TWL LRG LVL3 (GOWN DISPOSABLE) ×1
KIT BASIN OR (CUSTOM PROCEDURE TRAY) ×2 IMPLANT
KIT ROOM TURNOVER OR (KITS) ×2 IMPLANT
NEEDLE SPNL 18GX3.5 QUINCKE PK (NEEDLE) ×2 IMPLANT
NS IRRIG 1000ML POUR BTL (IV SOLUTION) ×2 IMPLANT
PACK LAMINECTOMY ORTHO (CUSTOM PROCEDURE TRAY) ×2 IMPLANT
PAD ARMBOARD 7.5X6 YLW CONV (MISCELLANEOUS) ×4 IMPLANT
PATTIES SURGICAL .5 X.5 (GAUZE/BANDAGES/DRESSINGS) IMPLANT
PATTIES SURGICAL .75X.75 (GAUZE/BANDAGES/DRESSINGS) IMPLANT
SPONGE LAP 4X18 X RAY DECT (DISPOSABLE) IMPLANT
SPONGE SURGIFOAM ABS GEL 100 (HEMOSTASIS) ×2 IMPLANT
SUT VIC AB 1 CTX 36 (SUTURE) ×1
SUT VIC AB 1 CTX36XBRD ANBCTR (SUTURE) ×1 IMPLANT
SUT VIC AB 2-0 CT1 27 (SUTURE) ×1
SUT VIC AB 2-0 CT1 TAPERPNT 27 (SUTURE) ×1 IMPLANT
SUT VIC AB 3-0 X1 27 (SUTURE) ×2 IMPLANT
SUT VICRYL 0 UR6 27IN ABS (SUTURE) ×2 IMPLANT
SYR 20CC LL (SYRINGE) ×2 IMPLANT
SYR CONTROL 10ML LL (SYRINGE) ×2 IMPLANT
TOWEL OR 17X24 6PK STRL BLUE (TOWEL DISPOSABLE) ×2 IMPLANT
TOWEL OR 17X26 10 PK STRL BLUE (TOWEL DISPOSABLE) ×2 IMPLANT
TRAY FOLEY CATH 16FRSI W/METER (SET/KITS/TRAYS/PACK) IMPLANT
WATER STERILE IRR 1000ML POUR (IV SOLUTION) ×2 IMPLANT

## 2015-10-16 NOTE — Op Note (Signed)
10/16/2015  6:08 PM  PATIENT:  [name] [name]  50 y.o. 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  PROCEDURE:  Procedure(s): REDO MICRODISCECTOMY L4-5    SURGEON:  [name], MD     ASSISTANT:  Benjiman Core, PA-C  (Present throughout the entire procedure and necessary for completion of procedure in a timely manner)     ANESTHESIA:  General,supplemented with local marcaine 0.5% 1:1 exparel 1.3% total of 20cc, Dr  Sherren Kerns.    COMPLICATIONS:  None.     EBL: 100cc  DRAINS: None.   PROCEDURE:The patient was met in the holding area, and the appropriate right Lumbar level L4-5 identified and marked with "x" and my initials.The patient was then transported to OR and was placed under general anesthesia without difficulty. The patient received appropriate preoperative antibiotic prophylaxis. The patient after intubation atraumatically was transferred to the operating room table, prone position, Wilson frame, sliding OR table. All pressure points were well padded. The arms in 90-90 well-padded at the elbows. Standard prep with DuraPrep solution lower dorsal spine to the mid sacral segment. Draped in the usual manner iodine Vi-Drape was used. Time-out procedure was called and correct. The skin incision scar at L4-5 marked and was infiltrated  with local marcaine 0.5% 1:1 exparel 1.3% total of 20cc used. An incision approximately an inch inch and a half in length was then made through skin and subcutaneous layers ellipsing the old skin incision in line with the right side of the expected midline. An incision made into the right lumbosacral fascia approximately an inch in length along the right L4-5 spinous processes. The paraspinal muscles  elevated from the right side of the spinous processes and lamina of L4 and L5 Boss McCollough retractor then placed and a Penfield #4 placed at the inferior aspect of the right L3-4 facet a lateral radiograph identified the marker at the level of the L3-4 facet. The operating room microscope sterilely draped brought into the field. Under the operating room microscope, the L4-5 interspace carefully debrided the small amount of muscle attachment here and microcurettes used to define the previous laminotomy right L4-5. A high-speed bur used to drill the medial aspect of the inferior articular process of L4. In order to medialize the laminotomy the right side of the L4 spinous process was thinned using a high speed bur. 2 mm Kerrison then used to enter the spinal canal over the medial aspect of the L4 inferior articular process carefully using the Kerrison to debris the attachment as a curet. Foraminotomy was then performed over the L5 nerve root. The medial 10% superior articular process of L5 then resected using 2 mm Kerrison. This allowed for identification of the thecal sac. Penfield 4 was then used to carefully mobilize the thecal sac medially and the L5 nerve root identified within the lateral recess flattened over the posterior aspect of the herniated disc. Carefully the lateral aspect of the L5 nerve root was identified and a Penfield 4 was used to mobilize the nerve medially such that the herniated disc was visible with microscope. Using a Derricho for retraction and a Gaspar Garbe #4 was used to define the rent in the posterior longitudinal ligament within the lateral recess on the left side longitudinally. Disc material was removed using micropituitary rongeurs and  nerve hook nerve root and then more easily able to be mobilized medially and retracted using a Derricho retractor. Further foraminotomies was performed over the L4 nerve root the nerve root was noted to be exiting with compression due to  hypertrophic reflected ligamentum flavum and scar tissue adherent along the posterior inferior aspect of the L5 nerve root into the lateral recess on the right side at L4-5.Marland Kitchen The L5 nerve root able to be retracted along the medial aspect of the L5 pedicle and disc further examined determining they're not to be any further free fragments oral to the L5 nerve root and the lateral thecal sac.. Ligamentum flavum was further debrided superiorly to well above the level L4-5 disc. Had a small amount of further resection of the L5 lamina inferiorly was performed. With this then the disc space at L4-5 was easily visualized and entry into the disc at the sided disc herniation was possible using a Penfield 4 intraoperative Lateral radiograph was used to identify the L4-5 disc with the Penfield 4 In place at the L4-5 disc space.Ligamentum flavum was debrided and lateral recess along the medial aspect L4-5 facet no further decompression was necessary. Ball tip nerve probe was then able to carefully palpate the neuroforamen for L4 and L5 finding these to be well decompressed. Bleeding was then controlled using thrombin-soaked Gelfoam small cottonoids. Small amount of bleeding within the soft tissue mass the laminotomy area was controlled using bipolar electrocautery. Irrigation was carried out using copious amounts of irrigant solution. All Gelfoam were then removed. No significant active bleeding present at the time of removal. All instruments sponge counts were correct traction system was then carefully removed. Lumbodorsal fascia was then carefully approximated with interrupted 0 Vicryl sutures with a UR 6 needle, the deep subcutaneous layers were approximated with interrupted 0 Vicryl sutures on UR 6 the appear subcutaneous layers approximated with interrupted 2-0 Vicryl sutures and the skin closed with a running subcutaneous stitch of 4-0 Vicryl. Dermabond was applied allowed to dry and then Mepilex bandage applied.  Patient was then carefully returned to supine position on a stretcher, reactivated and extubated. She was then returned to recovery room in satisfactory condition.  Benjiman Core, PA-C and Laure Kidney CRNFA performed the duties of Pensions consultant during this case. He was present from the beginning of the case to the end of the case assisting in transfer the patient from his stretcher to the OR table and back to the stretcher at the end of the case. Assisted in careful retraction and suction of the laminectomy site delicate neural structures operating under the operating room microscope. He also assisted with closure of the incision from the fascia to the skin applying the dressing.     NITKA,JAMES E  10/16/2015, 6:08 PM

## 2015-10-16 NOTE — Anesthesia Preprocedure Evaluation (Addendum)
Anesthesia Evaluation  Patient identified by MRN, date of birth, ID band Patient awake    Reviewed: Allergy & Precautions, NPO status , Patient's Chart, lab work & pertinent test results  History of Anesthesia Complications (+) PONV and history of anesthetic complications  Airway Mallampati: II   Neck ROM: full    Dental  (+) Teeth Intact, Dental Advidsory Given   Pulmonary           Cardiovascular hypertension,  Rhythm:regular     Neuro/Psych  Headaches, PSYCHIATRIC DISORDERS    GI/Hepatic   Endo/Other    Renal/GU      Musculoskeletal   Abdominal   Peds  Hematology   Anesthesia Other Findings   Reproductive/Obstetrics                            Anesthesia Physical Anesthesia Plan  ASA:   Anesthesia Plan:    Post-op Pain Management:    Induction:   Airway Management Planned:   Additional Equipment:   Intra-op Plan:   Post-operative Plan:   Informed Consent:   Dental Advisory Given  Plan Discussed with: Anesthesiologist, CRNA and Surgeon  Anesthesia Plan Comments:         Anesthesia Quick Evaluation

## 2015-10-16 NOTE — Anesthesia Procedure Notes (Signed)
Procedure Name: Intubation Date/Time: 10/16/2015 4:15 PM Performed by: Neldon Newport Pre-anesthesia Checklist: Timeout performed, Patient being monitored, Suction available, Emergency Drugs available and Patient identified Patient Re-evaluated:Patient Re-evaluated prior to inductionOxygen Delivery Method: Circle system utilized Preoxygenation: Pre-oxygenation with 100% oxygen Intubation Type: IV induction Ventilation: Mask ventilation without difficulty Laryngoscope Size: Mac and 3 Grade View: Grade I Tube size: 7.0 mm Number of attempts: 1 Placement Confirmation: positive ETCO2,  ETT inserted through vocal cords under direct vision and breath sounds checked- equal and bilateral Secured at: 22 cm Tube secured with: Tape Dental Injury: Teeth and Oropharynx as per pre-operative assessment

## 2015-10-16 NOTE — Transfer of Care (Signed)
Immediate Anesthesia Transfer of Care Note  Patient: [name] [name]  Procedure(s) Performed: Procedure(s): REDO MICRODISCECTOMY L4-5 (N/A)  Patient Location: PACU  Anesthesia Type:General  Level of Consciousness: awake, alert , oriented and patient cooperative  Airway & Oxygen Therapy: Patient Spontanous Breathing and Patient connected to nasal cannula oxygen  Post-op Assessment: Report given to RN, Post -op Vital signs reviewed and stable, Patient moving all extremities and Patient moving all extremities X 4  Post vital signs: Reviewed and stable  Last Vitals:  Filed Vitals:   10/16/15 1403  BP: 98/58  Pulse: 71  Temp: 36.9 C  Resp: 18    Complications: No apparent anesthesia complications

## 2015-10-16 NOTE — Discharge Instructions (Signed)
° ° °  No lifting greater than 10 lbs. °Avoid bending, stooping and twisting. °Walk in house for first week them may start to get out slowly increasing distance up to one quarter mile by 3 weeks post op. °Keep incision dry for 3 days, may use tegaderm or similar water impervious dressing. ° °

## 2015-10-16 NOTE — H&P (Signed)
[name] [name] is an 50 y.o. female.   Chief Complaint: LBP and leg pain HPI:  Patient with ongoing LBP and leg pain.  Progressively worsening symptoms.  MRI showed recurrent L4-5 HNP.  Failed conservative treatment.   Past Medical History  Diagnosis Date  . Adrenal gland hypofunction (HCC)     related to post op, vancomycin & lumbar injection, steroids    . Depression   . Cancer ([place])     "early evolvng melanoma "- legs   . Arthritis     hnp-lumbar   . PONV (postoperative nausea and vomiting)     last 2 surgeries less nausea  . PONV (postoperative nausea and vomiting)     none with last surgery  . Blood dyscrasia     hx probable blood clot after ankle surgery  not definitive but treated per pt  . High cholesterol   . Heart murmur     years ago  . Pneumonia ~ 2009; 2010  . History of bronchitis   . History of blood transfusion     "16 so far" (07/25/2012)  . 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 Hypertension     takes HCTZ  . History of UTI   . Family history of adverse reaction to anesthesia     mom has N/V - zofran works  . Migraines     Past Surgical History  Procedure Laterality Date  . Melanoma excision  06/2011    right lower leg; "early evolving"  . Lumbar laminectomy  03/19/2012    Procedure: MICRODISCECTOMY LUMBAR LAMINECTOMY;  Surgeon: [name], MD;  Location: [place];  Service: Orthopedics;  Laterality: Right;  MIS Right L3-4 lateral recess decompression and Right L4-5 Microdiscectomy  . Laminectomy and microdiscectomy lumbar spine  07/25/2012    redo right L4-5 microdiscectomy  . Tonsillectomy  1977  . Cholecystectomy  2004  . Vaginal hysterectomy  1995    have Ovaries  . Repair peroneal tendons ankle  2010    right  . Bladder suspension  2006  . Shoulder arthroscopy  ~ 2009    "frozen shoulder manipulation; right" (07/25/2012)  . Lumbar laminectomy  07/25/2012    Procedure: MICRODISCECTOMY LUMBAR LAMINECTOMY;  Surgeon: [name], MD;   Location: [place];  Service: Orthopedics;  Laterality: N/A;  Re-do Right L4-5 Microdiscectomy  . Colonoscopy    . Lumbar laminectomy N/A 02/25/2014    Procedure: Bilateral L4-5 MICRODISCECTOMY with MIS;  Surgeon: [name], MD;  Location: [place];  Service: Orthopedics;  Laterality: N/A;  . Appendectomy    . Lumbar laminectomy N/A 09/23/2014    Procedure: RIGHT L4-5 AND L5-S1 MICRODISCECTOMIES;  Surgeon: [name], MD;  Location: [place];  Service: Orthopedics;  Laterality: N/A;  . Breast biopsy  ~ 2010    left breast, benign   . Breast lumpectomy  ~ 2010    left  . Diagnostic laparoscopy      2 cysts removed fr. R ovary     Family History  Problem Relation Age of Onset  . Hypertension Mother   . Coronary artery disease Mother   . Hypertension Father    Social History:  reports that she has never smoked. She has never used smokeless tobacco. She reports that she drinks about 1.2 oz of alcohol per week. She reports that she does not use illicit drugs.  Allergies:  Allergies  Allergen Reactions  . Amoxicillin Anaphylaxis, Hives and Itching  . Penicillins Anaphylaxis  Has patient had a PCN reaction causing immediate rash, facial/tongue/throat swelling, SOB or lightheadedness with hypotension: Yes Has patient had a PCN reaction causing severe rash involving mucus membranes or skin necrosis: No Has patient had a PCN reaction that required hospitalization Yes Has patient had a PCN reaction occurring within the last 10 years: No If all of the above answers are "NO", then may proceed with Cephalosporin use.   . Sulfa Antibiotics Hives and Other (See Comments)    Hives, mouth blisters   . Ciprofloxacin Hives  . Phenergan [Promethazine Hcl] Other (See Comments)    Caused "severe drop in blood pressure."  . Vancomycin Cross Reactors Rash    Pt stated she had no reaction to Vancomycin when it is given slow  . Chloroquine Other (See Comments)    Renal dysfunction  . Other Other (See  Comments)    From allergy testing--Canteloupe  . Sulfamethoxazole Hives    No prescriptions prior to admission    Results for orders placed or performed during the hospital encounter of 10/15/15 (from the past 48 hour(s))  Surgical pcr screen     Status: None   Collection Time: 10/15/15  9:00 AM  Result Value Ref Range   MRSA, PCR NEGATIVE NEGATIVE   Staphylococcus aureus NEGATIVE NEGATIVE    Comment:        The Xpert SA Assay (FDA approved for NASAL specimens in patients over 32 years of age), is one component of a comprehensive surveillance program.  Test performance has been validated by [facility] for patients greater than or equal to 91 year old. It is not intended to diagnose infection nor to guide or monitor treatment.   CBC     Status: None   Collection Time: 10/15/15  9:01 AM  Result Value Ref Range   WBC 4.8 4.0 - 10.5 K/uL   RBC 4.05 3.87 - 5.11 MIL/uL   Hemoglobin 12.7 12.0 - 15.0 g/dL   HCT 39.7 36.0 - 46.0 %   MCV 98.0 78.0 - 100.0 fL   MCH 31.4 26.0 - 34.0 pg   MCHC 32.0 30.0 - 36.0 g/dL   RDW 13.4 11.5 - 15.5 %   Platelets 230 150 - 400 K/uL  Comprehensive metabolic panel     Status: Abnormal   Collection Time: 10/15/15  9:01 AM  Result Value Ref Range   Sodium 141 135 - 145 mmol/L   Potassium 3.7 3.5 - 5.1 mmol/L   Chloride 107 101 - 111 mmol/L   CO2 29 22 - 32 mmol/L   Glucose, Bld 99 65 - 99 mg/dL   BUN 8 6 - 20 mg/dL   Creatinine, Ser 0.72 0.44 - 1.00 mg/dL   Calcium 8.9 8.9 - 10.3 mg/dL   Total Protein 5.8 (L) 6.5 - 8.1 g/dL   Albumin 3.5 3.5 - 5.0 g/dL   AST 42 (H) 15 - 41 U/L   ALT 60 (H) 14 - 54 U/L   Alkaline Phosphatase 71 38 - 126 U/L   Total Bilirubin 0.4 0.3 - 1.2 mg/dL   GFR calc non Af Amer >60 >60 mL/min   GFR calc Af Amer >60 >60 mL/min    Comment: (NOTE) The eGFR has been calculated using the CKD EPI equation. This calculation has not been validated in all clinical situations. eGFR's persistently <60 mL/min signify  possible Chronic Kidney Disease.    Anion gap 5 5 - 15   Dg Chest 2 View  10/15/2015  CLINICAL DATA:  Heart murmur.  Bronchitis EXAM: CHEST  2 VIEW COMPARISON:  09/23/2014 . FINDINGS: Mediastinum and hilar structures normal. Lungs are clear of acute infiltrate. Heart size normal. No pleural effusion or pneumothorax. IMPRESSION: No acute cardiopulmonary disease. Electronically Signed   By: Marcello Moores  Register   On: 10/15/2015 09:38    Review of Systems  Constitutional: Negative.   HENT: Negative.   Eyes: Negative.   Respiratory: Negative.   Cardiovascular: Negative.   Gastrointestinal: Negative.   Genitourinary: Negative.   Musculoskeletal: Positive for back pain.  Skin: Negative.   Neurological: Negative.   Psychiatric/Behavioral: Negative.     There were no vitals taken for this visit. Physical Exam  Constitutional: She is oriented to person, place, and time. No distress.  HENT:  Head: Atraumatic.  Eyes: EOM are normal.  Neck: Normal range of motion.  Cardiovascular: Normal rate.   Respiratory: No respiratory distress.  GI: She exhibits no distension.  Musculoskeletal: She exhibits tenderness.  Neurological: She is alert and oriented to person, place, and time.  Skin: Skin is warm and dry.  Psychiatric: She has a normal mood and affect.    EXAM: MRI LUMBAR SPINE WITHOUT AND WITH CONTRAST  TECHNIQUE: Multiplanar and multiecho pulse sequences of the lumbar spine were obtained without and with intravenous contrast.  CONTRAST: 94m MULTIHANCE GADOBENATE DIMEGLUMINE 529 MG/ML IV SOLN  [place] [address]  Results: Creatinine 0.6 mg/dL.  COMPARISON: 05/02/2014  FINDINGS: The lowest lumbar type non-rib-bearing vertebra is labeled as L5. The conus medullaris appears normal. Conus level: T12-L1.  Type 1 degenerative endplate findings noted anteriorly at the L4-5 level without significant intervertebral  disc edema or enhancement.  3 mm degenerative retrolisthesis at L4-5 and L5-S1.  Additional findings at individual levels are as follows:  L1-2: No impingement. Mild bilateral facet arthropathy and minimal disc bulge.  L2-3: No impingement. Mild disc bulge mild bilateral facet arthropathy.  L3-4: Mild bilateral foraminal stenosis, mild bilateral subarticular lateral recess stenosis, and borderline central narrowing of the thecal sac due to disc bulge, facet arthropathy, and left inferior foraminal disc protrusion. This disc protrusion has an extraforaminal component which displaces the left L3 nerve in the lateral extraforaminal space. Suspected subtle postoperative findings on the right at L3-4 as on image 17 series 9.  L4-5: Mild to moderate right and mild left foraminal stenosis with mild right subarticular lateral recess stenosis related to facet arthropathy, disc bulge, and a small recurrent right lateral recess disc protrusion. Bilateral laminectomies with partial facetectomies. Right lateral recess epidural fibrosis is present as on image 5 series 8 but above this I believe there to be a small component of recurrent disc protrusion. Impingement at this level is slightly worse.  L5-S1: Mild bilateral foraminal stenosis and borderline left subarticular lateral recess stenosis due to disc bulge, facet arthropathy, and intervertebral spurring. Prior right laminectomy. Epidural fibrosis in the right lateral recess. Impingement at this level is slightly worse.  IMPRESSION: 1. Mildly progressive impingement at L4-5 and L5-S1. Lumbar spondylosis and degenerative disc disease causes mild to moderate impingement at L4-5 and mild impingement at L3-4 and L5-S1, as detailed above.   Electronically Signed  By: WVan ClinesM.D.  On: 10/05/2015 15:17   Assessment/Plan Recurrent L4-5 HNP.  Will proceed with redo microdiscectomy.  Surgical procedure along with  possible risks and complications discussed in detail with Dr NLouanne Skye  All questions answered.    OWENS,Renata Gambino M 10/16/2015, 10:53 AM  Patient examined and lab reviewed with Ricard Dillon, PA-C.

## 2015-10-16 NOTE — Brief Op Note (Signed)
10/16/2015  6:04 PM  PATIENT:  [name] [name]  50 y.o. 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 Sherren Kerns.  EBL: 100cc  DRAINS: Foley to SD.  DICTATION ID:  [name] [name] E

## 2015-10-16 NOTE — Interval H&P Note (Signed)
The patient has been re-examined, and the chart reviewed, and there have been no interval changes to the documented history and physical.    The risks, benefits, and alternatives have been discussed at length, and the patient is willing to proceed.   

## 2015-10-17 DIAGNOSIS — M5126 Other intervertebral disc displacement, lumbar region: Secondary | ICD-10-CM | POA: Diagnosis not present

## 2015-10-17 MED ORDER — MORPHINE SULFATE ER 30 MG PO TBCR
30.0000 mg | EXTENDED_RELEASE_TABLET | Freq: Two times a day (BID) | ORAL | Status: DC
  Administered 2015-10-17 – 2015-10-21 (×9): 30 mg via ORAL
  Filled 2015-10-17 (×9): qty 1

## 2015-10-17 MED ORDER — OXYCODONE HCL 5 MG PO TABS
5.0000 mg | ORAL_TABLET | ORAL | Status: DC | PRN
  Administered 2015-10-17 – 2015-10-21 (×7): 5 mg via ORAL
  Filled 2015-10-17 (×7): qty 1

## 2015-10-17 NOTE — Progress Notes (Signed)
     Subjective: 1 Day Post-Op Procedure(s) (LRB): REDO MICRODISCECTOMY L4-5 (N/A) Awake, alert and oriented x 4. Having muscular spasm and difficulty initiating standing or walking, Diffuse right leg weakness. Shaking when trying to initiate walking. Patient reports pain as marked.    Objective:   VITALS:  Temp:  [97.1 F (36.2 C)-98.9 F (37.2 C)] 97.1 F (36.2 C) (01/21 0555) Pulse Rate:  [71-88] 77 (01/21 0555) Resp:  [12-19] 17 (01/21 0555) BP: (98-126)/(54-74) 114/60 mmHg (01/21 0555) SpO2:  [95 %-100 %] 98 % (01/21 0555) Weight:  [155 lb (70.308 kg)] 155 lb (70.308 kg) (01/20 1409)  ABD soft Intact pulses distally Incision: no drainage No cellulitis present Weak right leg foot dorsiflexion and right knee extension and right hip flexion, diffuse and nonfocal. Pathology intraop was small HNP right L4-5 affecting right L5 mildly and severe foramenal entrapment of the right L4 root, Majority of the right L4-5 facet is resected to decompress the Rt L4 root. Difficulty initiating standing and walking. Back pain primarily not complaining of leg pain. Baseline right foot drop. Needs AFO from home.   LABS  Recent Labs  10/15/15 0901  HGB 12.7  WBC 4.8  PLT 230    Recent Labs  10/15/15 0901  NA 141  K 3.7  CL 107  CO2 29  BUN 8  CREATININE 0.72  GLUCOSE 99   No results for input(s): LABPT, INR in the last 72 hours.   Assessment/Plan: 1 Day Post-Op Procedure(s) (LRB): REDO MICRODISCECTOMY L4-5 (N/A)  Advance diet Up with therapy D/C IV fluids Plan for discharge tomorrow  Was on morphine ER Up to BID at home, will restart this and use oxycodone for break through pain. [place], ice.  [name] [name] E 10/17/2015, 11:11 AM

## 2015-10-17 NOTE — Progress Notes (Signed)
Orthopedic Tech Progress Note Patient Details:  [name] [name] [birth year] [id number]  Patient ID: [name] [name], female   DOB: 05/28/1966, 50 y.o.   MRN: [id number] Called in bio-tech brace order; spoke with Jeanne Ivan, Bevin Das 10/17/2015, 12:20 PM

## 2015-10-17 NOTE — Progress Notes (Signed)
Patient ID: [name] [name], female   DOB: 07/26/1966, 50 y.o.   MRN: [id number] Patient is postoperative day 1 lumbar spine discectomy. Patient states that this is her fifth lumbar spine surgery. She states she's having difficulty with ambulation. We will have her work with physical therapy today and discharged to home when she is safe with ambulation.

## 2015-10-17 NOTE — Anesthesia Postprocedure Evaluation (Signed)
Anesthesia Post Note  Patient: [name] [name]  Procedure(s) Performed: Procedure(s) (LRB): REDO MICRODISCECTOMY L4-5 (N/A)  Patient location during evaluation: PACU Anesthesia Type: General Level of consciousness: sedated and patient cooperative Pain management: pain level controlled Vital Signs Assessment: post-procedure vital signs reviewed and stable Respiratory status: spontaneous breathing Cardiovascular status: stable Anesthetic complications: no    Last Vitals:  Filed Vitals:   10/16/15 2025 10/16/15 2033  BP:  124/73  Pulse:  79  Temp: 36.7 C 36.7 C  Resp:  17    Last Pain:  Filed Vitals:   10/16/15 2123  PainSc: [place]

## 2015-10-17 NOTE — Evaluation (Signed)
Physical Therapy Evaluation Patient Details Name: [name] [name] MRN: [id number] DOB: 07-26-1966 Today's Date: 10/17/2015   History of Present Illness  50 y.o. s/p REDO MICRODISCECTOMY L4-5. PMH includes arthritis, cancer, depression, PNA, hypertension, Rt shoulder arthroscopy, previous back surgeries.  Clinical Impression  Patient presents with problems listed below.  Will benefit from acute PT to maximize functional mobility prior to d/c home with husband.  Patient currently requiring max assist with gait - very unsteady at risk to fall.  Anticipate patient will not be ready for d/c tomorrow - will f/u with MD following am PT session.  Recommend w/c for home use for safety and stair negotiation, and f/u HHPT for continued therapy.    Follow Up Recommendations Home health PT;Supervision/Assistance - 24 hour    Equipment Recommendations  Wheelchair (measurements PT);Wheelchair cushion (measurements PT)    Recommendations for Other Services       Precautions / Restrictions Precautions Precautions: Back;Fall Precaution Comments: Reviewed back precautions Required Braces or Orthoses: Spinal Brace;Other Brace/Splint Spinal Brace: Lumbar corset (Newly ordered) Other Brace/Splint: AFO for RLE - patient reports she hasn't used it for a while Restrictions Weight Bearing Restrictions: No      Mobility  Bed Mobility Overal bed mobility: Needs Assistance Bed Mobility: Sit to Sidelying         Sit to sidelying: Min assist General bed mobility comments: Cues to maintain back precautions.  Assist to bring LE's onto bed.  Transfers Overall transfer level: Needs assistance Equipment used: Rolling walker (2 wheeled) Transfers: Sit to/from Stand Sit to Stand: Mod assist         General transfer comment: Verbal cues for hand placement and technique.  Assist to power up to standing and for balance.  Note tremors (clonus) in RLE and extending into trunk during stance.  Assist to  maintain balance.  Ambulation/Gait Ambulation/Gait assistance: Mod assist;Max assist Ambulation Distance (Feet): 24 Feet Assistive device: Rolling walker (2 wheeled) Gait Pattern/deviations: Step-through pattern;Decreased stride length;Decreased dorsiflexion - right;Steppage;Ataxic;Staggering left;Staggering right Gait velocity: decreased Gait velocity interpretation: Below normal speed for age/gender General Gait Details: Patient with very unsteady gait.  Tremors/clonus movements in stance and gait.  Decreased control of RLE in stance and swing phase of gait.  Foot drop on RLE requires steppage gait on Rt to clear foot.  Decreased control/ataxic movements.  Requires mod to max assist to prevent loss of balance/fall with gait.  Stairs            Wheelchair Mobility    Modified [name] (Stroke Patients Only)       Balance Overall balance assessment: Needs assistance;History of Falls Sitting-balance support: No upper extremity supported;Feet supported Sitting balance-Leahy Scale: Fair Sitting balance - Comments: Patient moved RUE quickly and hit Rt thigh, with immediate strong hip flexor response, sending patient's trunk forward with loss of balance.                                     Pertinent Vitals/Pain Pain Assessment: 0-10 Pain Score: 9  Pain Location: back and RLE following ambulation Pain Descriptors / Indicators: Aching;Sore;Spasm Pain Intervention(s): Monitored during session;Premedicated before session;Repositioned    Home Living Family/patient expects to be discharged to:: Private residence Living Arrangements: Spouse/significant other Available Help at Discharge: Family;Available 24 hours/day;Available PRN/intermittently (Husband off work for a week) Type of Home: House Home Access: Stairs to enter Entrance Stairs-Rails: None Entrance Stairs-Number of Steps: 2 Home  Layout: Two level;Able to live on main level with bedroom/bathroom Home  Equipment: Gilford Rile - 2 wheels;Cane - single point;Bedside commode;Adaptive equipment (AFO RLE)      Prior Function Level of Independence: Needs assistance   Gait / Transfers Assistance Needed: Using cane for ambulation  ADL's / Homemaking Assistance Needed: assist with LB dressing        Hand Dominance   Dominant Hand: Right    Extremity/Trunk Assessment   Upper Extremity Assessment: Defer to OT evaluation           Lower Extremity Assessment: Generalized weakness;RLE deficits/detail RLE Deficits / Details: Hip flexion 3-/5; knee extension 3-/5, DF 0/5.  Noted tremors (? clonus) at ankle.  Decreased sensation anterior/lateral thigh and lateral lower leg.       Communication   Communication: No difficulties  Cognition Arousal/Alertness: Awake/alert Behavior During Therapy: WFL for tasks assessed/performed Overall Cognitive Status: Impaired/Different from baseline Area of Impairment: Problem solving             Problem Solving: Slow processing;Difficulty sequencing      General Comments      Exercises        Assessment/Plan    PT Assessment Patient needs continued PT services  PT Diagnosis Difficulty walking;Abnormality of gait;Generalized weakness;Acute pain   PT Problem List Decreased strength;Decreased activity tolerance;Decreased balance;Decreased mobility;Decreased coordination;Decreased knowledge of use of DME;Decreased knowledge of precautions;Impaired sensation;Pain  PT Treatment Interventions DME instruction;Gait training;Stair training;Functional mobility training;Therapeutic activities;Balance training;Patient/family education   PT Goals (Current goals can be found in the Care Plan section) Acute Rehab PT Goals Patient Stated Goal: To get stronger.  To walk PT Goal Formulation: With patient/family Time For Goal Achievement: 10/24/15 Potential to Achieve Goals: Good    Frequency Min 6X/week   Barriers to discharge        Co-evaluation                End of Session Equipment Utilized During Treatment: Gait belt Activity Tolerance: Patient limited by fatigue;Patient limited by pain Patient left: in bed;with call bell/phone within reach;with family/visitor present Nurse Communication: Mobility status    Functional Assessment Tool Used: Clinical judgement Functional Limitation: Mobility: Walking and moving around Mobility: Walking and Moving Around Current Status [id number]): At least 40 percent but less than 60 percent impaired, limited or restricted Mobility: Walking and Moving Around Goal Status [phone]): At least 1 percent but less than 20 percent impaired, limited or restricted    Time: 1325-1359 PT Time Calculation (min) (ACUTE ONLY): 34 min   Charges:   PT Evaluation $PT Eval Moderate Complexity: 1 Procedure PT Treatments $Gait Training: 8-22 mins $Therapeutic Activity: 8-22 mins   PT G Codes:   PT G-Codes **NOT FOR INPATIENT CLASS** Functional Assessment Tool Used: Clinical judgement Functional Limitation: Mobility: Walking and moving around Mobility: Walking and Moving Around Current Status [id number]): At least 40 percent but less than 60 percent impaired, limited or restricted Mobility: Walking and Moving Around Goal Status [phone]): At least 1 percent but less than 20 percent impaired, limited or restricted    Despina Pole 10/17/2015, 4:44 PM Carita Pian. Sanjuana Kava, [place] Pager [phone]

## 2015-10-17 NOTE — Care Management Note (Signed)
Case Management Note  Patient Details  Name: [name] MRN: [id number] Date of Birth: 1966/08/29  Subjective/Objective:  50 yr old female s/p L4-5 redo microdiscectomy                  Action/Plan: Case manager spoke with patient's husband, (she had just fallen asleep). Choice for Home health was offered. He stated they just used [facility], will do so now. Patient has no DME needs. Referral for Home Health OT/PT was called to Estrella Myrtle, [facility]. Patient will have family support at discharge.   Expected Discharge Date:    10/18/15              Expected Discharge Plan:  [place]  In-House Referral:  NA  Discharge planning Services  CM Consult  Post Acute Care Choice:  Home Health Choice offered to:  Spouse  DME Arranged:  N/A DME Agency:  NA  HH Arranged:  OT, PT [place] Agency:  [place]  Status of Service:  Completed, signed off  Medicare Important Message Given:    Date Medicare IM Given:    Medicare IM give by:    Date Additional Medicare IM Given:    Additional Medicare Important Message give by:     If discussed at [place] of Stay Meetings, dates discussed:    Additional Comments:  Ninfa Meeker, RN 10/17/2015, 11:38 AM

## 2015-10-17 NOTE — Evaluation (Addendum)
Occupational Therapy Evaluation Patient Details Name: [name] [name] MRN: [id number] DOB: Aug 13, 1966 Today's Date: 10/17/2015    History of Present Illness 50 y.o. s/p REDO MICRODISCECTOMY L4-5. PMH includes arthritis, cancer, depression, PNA, hypertension, Rt shoulder arthroscopy, previous back surgeries.   Clinical Impression   Pt s/p above. Pt with history of falls. Pt getting assist with LB dressing, PTA. Feel pt will benefit from acute OT to increase independence prior to d/c. Recommending HHOT upon d/c-notified MD.    Follow Up Recommendations  Home health OT;Supervision - Intermittent    Equipment Recommendations  Other (comment) (toilet aide )    Recommendations for Other Services       Precautions / Restrictions Precautions Precautions: Back;Fall Precaution Booklet Issued: Yes (comment) Precaution Comments: educated on back precautions Restrictions Weight Bearing Restrictions: No      Mobility Bed Mobility Overal bed mobility: Needs Assistance Bed Mobility: Rolling;Sidelying to Sit Rolling: Modified independent (Device/Increase time) (educated that at home she may need side of mattress) Sidelying to sit: Supervision       General bed mobility comments: cues given  Transfers Overall transfer level: Needs assistance Equipment used: Rolling walker (2 wheeled);1 person hand held assist Transfers: Sit to/from Stand Sit to Stand: Mod assist (improvement from [facility]; Mod assist from recliner chair)              Balance Overall balance assessment: History of Falls      Unsteady on feet.                                    ADL Overall ADL's : Needs assistance/impaired     Grooming: Wash/dry face;Wash/dry hands;Oral care;Applying deodorant;Set up;Supervision/safety (applied lotion to face) Grooming Details (indicate cue type and reason): tactile and verbal cues given             Lower Body Dressing: Sit to/from stand;Maximal  assistance   Toilet Transfer: Moderate assistance;Ambulation;RW;BSC (Min-Mod assist for ambulation in session)   Toileting- Clothing Manipulation and Hygiene: Moderate assistance;Sit to/from stand       Functional mobility during ADLs: Rolling walker (Min-Mod assist for ambulation) General ADL Comments: Educated on what pt could use for toilet aide for hygiene. Discussed incorporating precautions into functional activities.     Vision     Perception     Praxis      Pertinent Vitals/Pain Pain Assessment: 0-10 Pain Score: 5  Pain Location: back and RLE Pain Intervention(s): Monitored during session;Repositioned     Hand Dominance Right   Extremity/Trunk Assessment Upper Extremity Assessment Upper Extremity Assessment: Overall WFL for tasks assessed (history of shoulder arthroscopy)   Lower Extremity Assessment Lower Extremity Assessment: Defer to PT evaluation (wears brace on RLE at times; RLE would buckle, PTA)       Communication Communication Communication: No difficulties   Cognition Arousal/Alertness: Awake/alert Behavior During Therapy: WFL for tasks assessed/performed Overall Cognitive Status: No family/caregiver present to determine baseline cognitive functioning (seemed to have slow processing)                     General Comments       Exercises       Shoulder Instructions      Home Living Family/patient expects to be discharged to:: Private residence Living Arrangements: Spouse/significant other Available Help at Discharge: Family (24/7 until [facility] then intermittent) Type of Home: House Home Access: Stairs to enter [employer]  of Steps: 2 Entrance Stairs-Rails: None Home Layout: Two level;Able to live on main level with bedroom/bathroom     Bathroom Shower/Tub: Occupational psychologist: Standard     Home Equipment: Environmental consultant - 2 wheels;Cane - single point;Bedside commode;Adaptive equipment (also brace for  RLE) Adaptive Equipment: Reacher;Sock aid;Long-handled shoe horn;Long-handled sponge        Prior Functioning/Environment Level of Independence: Needs assistance  Gait / Transfers Assistance Needed: using cane some ADL's / Homemaking Assistance Needed: assist with LB dressing        OT Diagnosis: Generalized weakness; other-decreased balance   OT Problem List: Decreased strength;Decreased range of motion;Decreased activity tolerance;Impaired balance (sitting and/or standing);Decreased cognition;Decreased knowledge of use of DME or AE;Decreased knowledge of precautions;Pain   OT Treatment/Interventions: Self-care/ADL training;DME and/or AE instruction;Therapeutic activities;Patient/family education;Balance training    OT Goals(Current goals can be found in the care plan section) Acute Rehab OT Goals Patient Stated Goal: to get a job OT Goal Formulation: With patient Time For Goal Achievement: 10/24/15 Potential to Achieve Goals: Good ADL Goals Pt Will Perform Grooming: with set-up;standing Pt Will Perform Lower Body Dressing: with adaptive equipment;sit to/from stand;with min guard assist Pt Will Transfer to Toilet: with min guard assist;ambulating (3 in 1 over commode) Pt Will Perform Toileting - Clothing Manipulation and hygiene: with supervision;with adaptive equipment;sit to/from stand;with set-up Additional ADL Goal #1: Pt will independently verbalize 3/3 back precautions and maintain during session.  OT Frequency: Min 2X/week   Barriers to D/C:            Co-evaluation              End of Session Equipment Utilized During Treatment: Gait belt;Rolling walker  Activity Tolerance: Patient tolerated treatment well Patient left: in chair;with call bell/phone within reach;Other (comment) (MD in room)   Time: [phone] OT Time Calculation (min): 35 min Charges:  OT General Charges $OT Visit: 1 Procedure OT Evaluation $OT Eval Moderate Complexity: 1 Procedure OT  Treatments $Self Care/Home Management : 8-22 mins G-Codes: OT G-codes **NOT FOR INPATIENT CLASS** Functional Assessment Tool Used: clinical judgment Functional Limitation: Self care Self Care Current Status [id number]): At least 40 percent but less than 60 percent impaired, limited or restricted Self Care Goal Status [id number]): At least 1 percent but less than 20 percent impaired, limited or restricted  Benito Mccreedy OTR/L [id number] 10/17/2015, 9:44 AM

## 2015-10-18 DIAGNOSIS — M5126 Other intervertebral disc displacement, lumbar region: Secondary | ICD-10-CM | POA: Diagnosis not present

## 2015-10-18 NOTE — Progress Notes (Addendum)
Physical Therapy Treatment Patient Details Name: [name] [name] MRN: [id number] DOB: 12-27-1965 Today's Date: 10/18/2015    History of Present Illness 50 y.o. s/p REDO MICRODISCECTOMY L4-5. PMH includes arthritis, cancer, depression, PNA, hypertension, Rt shoulder arthroscopy, previous back surgeries.    PT Comments    Patient with very unsteady gait.  Tremors/clonus movements in stance and gait. Decreased control/ataxic movements.  Requires mod to max assist to prevent loss of balance/fall with gait  Given her current functional status, I'm hesitant to recommend dc home tomorrow, which I know is the plan; with diagnosis of microdiscectomy re-do, I'm not sure that her insurance would cover for CIR stay to maximize independence and safety with mobility and decr fall risk; still, I believe she would benefit, given today's performance;  Addendum (1/23, 8:10 am): I spoke with [place], who verified with me that her insurance will not cover CIR.  Exie tells me her husband will be home to give her assistance the first week she is home.   Follow Up Recommendations  Home health PT;Supervision/Assistance - 24 hour     Equipment Recommendations  Wheelchair (measurements PT);Wheelchair cushion (measurements PT)    Recommendations for Other Services OT consult     Precautions / Restrictions Precautions Precautions: Back;Fall Precaution Booklet Issued: No Precaution Comments: Reviewed back precautions Required Braces or Orthoses: Spinal Brace;Other Brace/Splint Spinal Brace: Lumbar corset Other Brace/Splint: AFO for RLE-did not use in session Restrictions Weight Bearing Restrictions: No    Mobility  Bed Mobility Overal bed mobility: Needs Assistance Bed Mobility: Rolling;Sidelying to Sit Rolling: Supervision Sidelying to sit: Min assist     Sit to sidelying: Supervision General bed mobility comments: cue for technique; min assist to elevate  trunk  Transfers Overall transfer level: Needs assistance Equipment used: Rolling walker (2 wheeled) Transfers: Sit to/from Stand Sit to Stand: Mod assist         General transfer comment: Verbal cues for hand placement and technique.  Assist to power up to standing and for balance.  Note tremors (clonus) in RLE and extending into trunk during stance.  Assist to maintain balance.  Ambulation/Gait Ambulation/Gait assistance: Mod assist Ambulation Distance (Feet): 45 Feet Assistive device: Rolling walker (2 wheeled) Gait Pattern/deviations: Step-through pattern;Decreased stride length;Steppage;Ataxic Gait velocity: decreased   General Gait Details: Patient with very unsteady gait.  Tremors/clonus movements in stance and gait.  Decreased control of RLE in stance and swing phase of gait.  Foot drop on RLE requires steppage gait on Rt to clear foot.  Decreased control/ataxic movements.  Requires mod to max assist to prevent loss of balance/fall with gait.   Stairs Stairs:  (Will need to consider bumping up in wheelchair)          Wheelchair Mobility    Modified [name] (Stroke Patients Only)       Balance Overall balance assessment: Needs assistance   Sitting balance-Leahy Scale: Fair       Standing balance-Leahy Scale: Poor                      Cognition Arousal/Alertness: Awake/alert Behavior During Therapy: WFL for tasks assessed/performed Overall Cognitive Status: No family/caregiver present to determine baseline cognitive functioning Area of Impairment: Problem solving             Problem Solving: Slow processing      Exercises      General Comments General comments (skin integrity, edema, etc.): Given her current functional status, I'm hesitant to recommend  dc home tomorrow, which I know is the plan; with diagnosis of microdiscectomy re-do, I'm not sure that her insurance would cover for CIR; still, I believe she would benefit, given today's  perfomance      Pertinent Vitals/Pain Pain Assessment: 0-10 Pain Score: 4  Pain Location: back Pain Descriptors / Indicators: Aching;Sore;Spasm Pain Intervention(s): Monitored during session;Repositioned    Home Living                      Prior Function            PT Goals (current goals can now be found in the care plan section) Acute Rehab PT Goals Patient Stated Goal: wants to apply for a [employer] job PT Goal Formulation: With patient/family Time For Goal Achievement: 10/24/15 Potential to Achieve Goals: Good Progress towards PT goals: Progressing toward goals    Frequency  Min 6X/week    PT Plan Current plan remains appropriate    Co-evaluation             End of Session Equipment Utilized During Treatment: Gait belt Activity Tolerance: Patient limited by fatigue;Patient limited by pain Patient left: in chair;with call bell/phone within reach     Time: 1451-1514 PT Time Calculation (min) (ACUTE ONLY): 23 min  Charges:  $Gait Training: 23-37 mins                    G Codes:      Quin Hoop 10/18/2015, 5:24 PM  Roney Marion, [place] Pager [phone] Office [phone]

## 2015-10-18 NOTE — Progress Notes (Signed)
Occupational Therapy Treatment Patient Details Name: [name] [name] MRN: [id number] DOB: [birth year] Today's Date: 10/18/2015    History of present illness 50 y.o. s/p REDO MICRODISCECTOMY L4-5. PMH includes arthritis, cancer, depression, PNA, hypertension, Rt shoulder arthroscopy, previous back surgeries.   OT comments  Education provided in session. Will continue to follow acutely. Pt having difficulty with ambulation.   Follow Up Recommendations  Home health OT;Supervision/Assistance - 24 hour    Equipment Recommendations  Other (comment) (toilet aide)    Recommendations for Other Services      Precautions / Restrictions Precautions Precautions: Back;Fall Precaution Booklet Issued: No Precaution Comments: Reviewed back precautions Required Braces or Orthoses: Spinal Brace;Other Brace/Splint Spinal Brace: Lumbar corset (already on in session) Other Brace/Splint: AFO for RLE-did not use in session Restrictions Weight Bearing Restrictions: No       Mobility Bed Mobility Overal bed mobility: Needs Assistance Bed Mobility: Sit to Sidelying         Sit to sidelying: Supervision General bed mobility comments: cue for technique  Transfers Overall transfer level: Needs assistance Equipment used: Rolling walker (2 wheeled) Transfers: Sit to/from Stand Sit to Stand: Min assist         General transfer comment: Min assist from recliner chair; Min guard from [place]      unsteady with ambulation (min-mod assist)                             ADL Overall ADL's : Needs assistance/impaired                 Upper Body Dressing : Supervision/safety;Sitting Upper Body Dressing Details (indicate cue type and reason): doffed back brace; OT did fasten straps on side to prevent them getting tangled after she took off brace     Toilet Transfer: Ambulation;BSC;RW (Min-Mod assist for ambulation to/from [facility])   Toileting- Clothing Manipulation and  Hygiene: Min guard;Sit to/from stand       Functional mobility during ADLs: Rolling walker (Min-Mod assist) General ADL Comments: Discussed AE and LB dressing technique. Pt verbalized understanding of sock aid. Cues for precautions in session.      Vision                     Perception     Praxis      Cognition  Awake/Alert Behavior During Therapy: WFL for tasks assessed/performed Overall Cognitive Status: No family/caregiver present to determine baseline cognitive functioning Area of Impairment: Problem solving              Problem Solving: Slow processing      Extremity/Trunk Assessment               Exercises     Shoulder Instructions       General Comments      Pertinent Vitals/ Pain       Pain Assessment: 0-10 Pain Score: 4  Pain Location: back Pain Intervention(s): Monitored during session;Repositioned  Home Living                                          Prior Functioning/Environment              Frequency Min 2X/week     Progress Toward Goals  OT Goals(current goals can now be found in the care  plan section)  Progress towards OT goals: Progressing toward goals  Acute Rehab OT Goals Patient Stated Goal: not stated OT Goal Formulation: With patient Time For Goal Achievement: 10/24/15 Potential to Achieve Goals: Good ADL Goals Pt Will Perform Grooming: with set-up;standing Pt Will Perform Lower Body Dressing: with adaptive equipment;sit to/from stand;with min guard assist Pt Will Transfer to Toilet: with min guard assist;ambulating (3 in 1 over commode) Pt Will Perform Toileting - Clothing Manipulation and hygiene: with supervision;with adaptive equipment;sit to/from stand;with set-up Additional ADL Goal #1: Pt will independently verbalize 3/3 back precautions and maintain during session.  Plan Discharge plan needs to be updated    Co-evaluation                 End of Session Equipment Utilized  During Treatment: Gait belt;Rolling walker;Back brace   Activity Tolerance Patient tolerated treatment well   Patient Left in bed;with call bell/phone within reach   Nurse Communication          Time: [phone] (pt sitting on BSC to urinate for approximately 6 minutes) OT Time Calculation (min): 23 min  Charges: OT General Charges $OT Visit: 1 Procedure OT Treatments $Self Care/Home Management : 8-22 mins  Benito Mccreedy OTR/L [id number] 10/18/2015, 4:46 PM

## 2015-10-18 NOTE — Progress Notes (Signed)
Patient ID: [name] [name], female   DOB: Oct 15, 1965, 50 y.o.   MRN: [id number] Patient is postoperative day 2 lumbar spine surgery. Patient states that she was unsteady with therapy. She is unsafe for discharge to home at this time. Plan for discharge to home tomorrow.

## 2015-10-19 ENCOUNTER — Observation Stay (HOSPITAL_COMMUNITY): Payer: BLUE CROSS/BLUE SHIELD

## 2015-10-19 ENCOUNTER — Encounter (HOSPITAL_COMMUNITY): Payer: Self-pay | Admitting: Specialist

## 2015-10-19 DIAGNOSIS — M4802 Spinal stenosis, cervical region: Secondary | ICD-10-CM | POA: Diagnosis not present

## 2015-10-19 DIAGNOSIS — Z9889 Other specified postprocedural states: Secondary | ICD-10-CM | POA: Diagnosis not present

## 2015-10-19 DIAGNOSIS — R29898 Other symptoms and signs involving the musculoskeletal system: Secondary | ICD-10-CM

## 2015-10-19 DIAGNOSIS — M62838 Other muscle spasm: Secondary | ICD-10-CM | POA: Diagnosis not present

## 2015-10-19 DIAGNOSIS — M5126 Other intervertebral disc displacement, lumbar region: Secondary | ICD-10-CM | POA: Diagnosis not present

## 2015-10-19 DIAGNOSIS — E237 Disorder of pituitary gland, unspecified: Secondary | ICD-10-CM | POA: Diagnosis not present

## 2015-10-19 LAB — COMPREHENSIVE METABOLIC PANEL
ALT: 156 U/L — ABNORMAL HIGH (ref 14–54)
ANION GAP: 9 (ref 5–15)
AST: 139 U/L — ABNORMAL HIGH (ref 15–41)
Albumin: 2.8 g/dL — ABNORMAL LOW (ref 3.5–5.0)
Alkaline Phosphatase: 87 U/L (ref 38–126)
BUN: 7 mg/dL (ref 6–20)
CALCIUM: 8.7 mg/dL — AB (ref 8.9–10.3)
CHLORIDE: 103 mmol/L (ref 101–111)
CO2: 31 mmol/L (ref 22–32)
CREATININE: 0.7 mg/dL (ref 0.44–1.00)
GFR calc Af Amer: >60 mL/min (ref >60)
GFR calc non Af Amer: >60 mL/min (ref >60)
GLUCOSE: 104 mg/dL — AB (ref 65–99)
Potassium: 3.2 mmol/L — ABNORMAL LOW (ref 3.5–5.1)
Sodium: 143 mmol/L (ref 135–145)
Total Bilirubin: 0.8 mg/dL (ref 0.3–1.2)
Total Protein: 5.2 g/dL — ABNORMAL LOW (ref 6.5–8.1)

## 2015-10-19 MED ORDER — GADOBENATE DIMEGLUMINE 529 MG/ML IV SOLN
15.0000 mL | Freq: Once | INTRAVENOUS | Status: AC | PRN
  Administered 2015-10-19: 15 mL via INTRAVENOUS

## 2015-10-19 MED FILL — Thrombin For Soln 20000 Unit: CUTANEOUS | Qty: 1 | Status: AC

## 2015-10-19 NOTE — Progress Notes (Signed)
PT recommending wheelchair. Contacted James with [employer] and requested wheelchair be delivered to patient's room.

## 2015-10-19 NOTE — Progress Notes (Signed)
Physical Therapy Treatment Patient Details Name: [name] [name] MRN: [id number] DOB: 05/06/1966 Today's Date: 10/19/2015    History of Present Illness 50 y.o. s/p REDO MICRODISCECTOMY L4-5. PMH includes arthritis, cancer, depression, PNA, hypertension, Rt shoulder arthroscopy, previous back surgeries.    PT Comments    Small, but notable improvements in initial sit<>stand, and initial steps today; stability in standing adn walking deteriorated with fatigue; Appreciate NeuroHospitalist PA observing session; At this point, it seems the biggest piece of PT's role is to highlight progress and encourage [name] [name]  We did discuss using a wheelchair to bump up teh steps to enter her home with the help of 2 adults; husband, [name] [name] aware of this possible need.  Follow Up Recommendations  Home health PT;Supervision/Assistance - 24 hour     Equipment Recommendations  Wheelchair (measurements PT);Wheelchair cushion (measurements PT)    Recommendations for Other Services       Precautions / Restrictions Precautions Precautions: Back;Fall Precaution Comments: Reviewed back precautions Required Braces or Orthoses: Spinal Brace;Other Brace/Splint Spinal Brace: Lumbar corset Other Brace/Splint: AFO for RLE-did not use in session (it is still at home)    Mobility  Bed Mobility Overal bed mobility: Needs Assistance Bed Mobility: Rolling;Sidelying to Sit Rolling: Supervision Sidelying to sit: Supervision       General bed mobility comments: less need for assist  Transfers Overall transfer level: Needs assistance Equipment used: Rolling walker (2 wheeled) Transfers: Sit to/from Stand Sit to Stand: Mod assist         General transfer comment: Verbal cues for hand placement and technique.  Assist to power up to standing and for balance.    Assist to maintain balance. Less tremor-like motions today  Ambulation/Gait Ambulation/Gait assistance: Mod assist Ambulation Distance (Feet): 45  Feet Assistive device: Rolling walker (2 wheeled) Gait Pattern/deviations: Step-through pattern;Decreased step length - right;Decreased step length - left;Ataxic Gait velocity: decreased   General Gait Details: Noted better first few steps of gait today, with tremors and dyscoordination occurring after the first few steps rather than immediately upon standing like last session; decr trunk stability and heavy dependence on RW for stabitly; occasional weight shift posteriorly, needing very close guard to keep from losing balance posteriorly; decr stance stabilty both R and L stance; did not noted a pronounced assymetry in step length or stance stability   Stairs            Wheelchair Mobility    Modified [name] (Stroke Patients Only)       Balance                                    Cognition Arousal/Alertness: Awake/alert Behavior During Therapy: WFL for tasks assessed/performed Overall Cognitive Status: Within Functional Limits for tasks assessed (for simple mobility tasks)                      Exercises      General Comments        Pertinent Vitals/Pain Pain Assessment: 0-10 Pain Score: 4  Pain Location: back Pain Descriptors / Indicators: Aching Pain Intervention(s): Limited activity within patient's tolerance;Monitored during session    Home Living                      Prior Function            PT Goals (current goals can now be found in the  care plan section) Acute Rehab PT Goals Patient Stated Goal: wants to apply for a [employer] job PT Goal Formulation: With patient/family Time For Goal Achievement: 10/24/15 Potential to Achieve Goals: Good Progress towards PT goals: Progressing toward goals    Frequency  Min 6X/week    PT Plan Current plan remains appropriate    Co-evaluation             End of Session Equipment Utilized During Treatment: Gait belt Activity Tolerance: Patient tolerated treatment  well;Patient limited by fatigue Patient left: in chair;with call bell/phone within reach (was able to stand adn walk for NeuroHospitalist PA to observe gait)     Time: [id number] PT Time Calculation (min) (ACUTE ONLY): 30 min  Charges:  $Gait Training: 23-37 mins                    G Codes:      Quin Hoop 10/19/2015, 1:25 PM  Roney Marion, [place] Pager [phone] Office [phone]

## 2015-10-19 NOTE — Progress Notes (Signed)
Subjective: C/o right leg weakness.  Difficulty ambulating with PT.  Had right foot drop preop.  C/o some back pain.      Objective: Vital signs in last 24 hours: Temp:  [99 F (37.2 C)-100.8 F (38.2 C)] 99.2 F (37.3 C) (01/23 [id number]) Pulse Rate:  [77-93] 77 (01/23 0638) Resp:  [16-18] 16 (01/23 [id number]) BP: (101-126)/(59-77) 126/77 mmHg (01/23 0638) SpO2:  [96 %-99 %] 99 % (01/23 [id number])  Intake/Output from previous day: 01/22 0701 - 01/23 0700 In: 480 [P.O.:480] Out: -  Intake/Output this shift:    No results for input(s): HGB in the last 72 hours. No results for input(s): WBC, RBC, HCT, PLT in the last 72 hours. No results for input(s): NA, K, CL, CO2, BUN, CREATININE, GLUCOSE, CALCIUM in the last 72 hours. No results for input(s): LABPT, INR in the last 72 hours.  Exam:  Alert and oriented.  Wound looks good.  No drainage or signs of infection.  Neg log roll.  bilat knee and ankle reflexes WNL.  Neg clonus bilateral.  bilat calves nontender.  Positive right ant tib, gastroc, and quad weakness.    Assessment/Plan: S/p right L4-5 redo microdiscectomy, unsteady gait, and Right leg weakness.  Dr Louanne Skye spoke with neurology and they will consult today.  Continue present care.     Delonta Yohannes M 10/19/2015, 11:10 AM

## 2015-10-19 NOTE — Progress Notes (Signed)
OT Cancellation Note  Patient Details Name: [name] [name] MRN: [id number] DOB: 1966/05/05   Cancelled Treatment:    Reason Eval/Treat Not Completed: Pain limiting ability to participate - pt reporting 10/10 pain; Fatigue/lethargy limiting ability to participate - pt sleeping on OT arrival and was very lethargic upon waking. Difficulty completing full sentences and requested OT to come back later. Will attempt to see later this morning if time allows.  Redmond Baseman, OTR/L Pager: [phone] 10/19/2015, 9:04 AM

## 2015-10-19 NOTE — Consult Note (Signed)
NEURO HOSPITALIST CONSULT NOTE   Requestig physician: Dr. Louanne Skye   Reason for Consult: LE weakness after surgery  HPI:                                                                                                                                          [name] [name] is an 50 y.o. female who is 2 days Post op L 4/5 microdiscectomy. Prior to surgery husband states she had a slight right foot drop but was able to ambulate well.  After surgery she has been needing use of a walker and feels her right leg is weaker.  She has no sensory level or dermatomal distribution of decreased sensation. I was able to watch her with PT.  She was very slow and hesitant to even stand, she tool a long time to get up to a standing position and then showed a spastic like gait. Patient herself feels she is getting better slowly.    Past Medical History  Diagnosis Date  . Adrenal gland hypofunction (HCC)     related to post op, vancomycin & lumbar injection, steroids    . Depression   . Cancer ([place])     "early evolvng melanoma "- legs   . Arthritis     hnp-lumbar   . PONV (postoperative nausea and vomiting)     last 2 surgeries less nausea  . PONV (postoperative nausea and vomiting)     none with last surgery  . Blood dyscrasia     hx probable blood clot after ankle surgery  not definitive but treated per pt  . High cholesterol   . Heart murmur     years ago  . Pneumonia ~ 2009; 2010  . History of bronchitis   . History of blood transfusion     "16 so far" (07/25/2012)  . 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 Hypertension     takes HCTZ  . History of UTI   . Family history of adverse reaction to anesthesia     mom has N/V - zofran works  . Migraines     Past Surgical History  Procedure Laterality Date  . Melanoma excision  06/2011    right lower leg; "early evolving"  . Lumbar laminectomy  03/19/2012    Procedure: MICRODISCECTOMY LUMBAR LAMINECTOMY;  Surgeon: [name], MD;  Location: [place];  Service: Orthopedics;  Laterality: Right;  MIS Right L3-4 lateral recess decompression and Right L4-5 Microdiscectomy  . Laminectomy and microdiscectomy lumbar spine  07/25/2012    redo right L4-5 microdiscectomy  . Tonsillectomy  1977  . Cholecystectomy  2004  . Vaginal hysterectomy  1995    have Ovaries  . Repair peroneal tendons ankle  2010    right  .  Bladder suspension  2006  . Shoulder arthroscopy  ~ 2009    "frozen shoulder manipulation; right" (07/25/2012)  . Lumbar laminectomy  07/25/2012    Procedure: MICRODISCECTOMY LUMBAR LAMINECTOMY;  Surgeon: [name], MD;  Location: [place];  Service: Orthopedics;  Laterality: N/A;  Re-do Right L4-5 Microdiscectomy  . Colonoscopy    . Lumbar laminectomy N/A 02/25/2014    Procedure: Bilateral L4-5 MICRODISCECTOMY with MIS;  Surgeon: [name], MD;  Location: [place];  Service: Orthopedics;  Laterality: N/A;  . Appendectomy    . Lumbar laminectomy N/A 09/23/2014    Procedure: RIGHT L4-5 AND L5-S1 MICRODISCECTOMIES;  Surgeon: [name], MD;  Location: [place];  Service: Orthopedics;  Laterality: N/A;  . Breast biopsy  ~ 2010    left breast, benign   . Breast lumpectomy  ~ 2010    left  . Diagnostic laparoscopy      2 cysts removed fr. R ovary   . Lumbar laminectomy N/A 10/16/2015    Procedure: REDO MICRODISCECTOMY L4-5;  Surgeon: [name], MD;  Location: [place];  Service: Orthopedics;  Laterality: N/A;    Family History  Problem Relation Age of Onset  . Hypertension Mother   . Coronary artery disease Mother   . Hypertension Father       Social History:  reports that she has never smoked. She has never used smokeless tobacco. She reports that she drinks about 1.2 oz of alcohol per week. She reports that she does not use illicit drugs.  Allergies  Allergen Reactions  . Amoxicillin Anaphylaxis, Hives and Itching  . Penicillins Anaphylaxis    Has patient had a PCN reaction causing immediate  rash, facial/tongue/throat swelling, SOB or lightheadedness with hypotension: Yes Has patient had a PCN reaction causing severe rash involving mucus membranes or skin necrosis: No Has patient had a PCN reaction that required hospitalization Yes Has patient had a PCN reaction occurring within the last 10 years: No If all of the above answers are "NO", then may proceed with Cephalosporin use.   . Sulfa Antibiotics Hives and Other (See Comments)    Hives, mouth blisters   . Ciprofloxacin Hives  . Phenergan [Promethazine Hcl] Other (See Comments)    Caused "severe drop in blood pressure."  . Vancomycin Cross Reactors Rash    Pt stated she had no reaction to Vancomycin when it is given slow  . Chloroquine Other (See Comments)    Renal dysfunction  . Other Other (See Comments)    From allergy testing--Canteloupe  . Sulfamethoxazole Hives    MEDICATIONS:                                                                                                                     Prior to Admission:  Prescriptions prior to admission  Medication Sig Dispense Refill Last Dose  . ALPRAZolam (XANAX) 0.5 MG tablet Take 0.5 mg by mouth at bedtime as needed for sleep.   4 Past Month at  Unknown time  . amphetamine-dextroamphetamine (ADDERALL) 20 MG tablet Take 20 mg by mouth daily.  0 Past Month at Unknown time  . escitalopram (LEXAPRO) 20 MG tablet Take 20 mg by mouth daily before breakfast. Reported on 10/13/2015   10/16/2015 at 0830  . morphine (MSIR) 15 MG tablet Take 15 mg by mouth every 4 (four) hours as needed.  0 10/16/2015 at 1200  . nitrofurantoin, macrocrystal-monohydrate, (MACROBID) 100 MG capsule Take 100 mg by mouth daily.  0 10/14/2015  . Probiotic CAPS Take 1 capsule by mouth daily.    10/15/2015 at Unknown time  . rosuvastatin (CRESTOR) 20 MG tablet Take 20 mg by mouth daily.   10/15/2015 at Unknown time  . topiramate (TOPAMAX) 50 MG tablet Take 150 mg by mouth daily.   10/16/2015 at 0830    Scheduled: . amphetamine-dextroamphetamine  20 mg Oral Daily  . docusate sodium  100 mg Oral BID  . escitalopram  20 mg Oral QAC breakfast  . morphine  30 mg Oral Q12H  . nitrofurantoin (macrocrystal-monohydrate)  100 mg Oral Daily  . rosuvastatin  20 mg Oral QHS  . sodium chloride  3 mL Intravenous Q12H  . topiramate  150 mg Oral Daily     ROS:                                                                                                                                       History obtained from the patient  General ROS: negative for - chills, fatigue, fever, night sweats, weight gain or weight loss Psychological ROS: negative for - behavioral disorder, hallucinations, memory difficulties, mood swings or suicidal ideation Ophthalmic ROS: negative for - blurry vision, double vision, eye pain or loss of vision ENT ROS: negative for - epistaxis, nasal discharge, oral lesions, sore throat, tinnitus or vertigo Allergy and Immunology ROS: negative for - hives or itchy/watery eyes Hematological and Lymphatic ROS: negative for - bleeding problems, bruising or swollen lymph nodes Endocrine ROS: negative for - galactorrhea, hair pattern changes, polydipsia/polyuria or temperature intolerance Respiratory ROS: negative for - cough, hemoptysis, shortness of breath or wheezing Cardiovascular ROS: negative for - chest pain, dyspnea on exertion, edema or irregular heartbeat Gastrointestinal ROS: negative for - abdominal pain, diarrhea, hematemesis, nausea/vomiting or stool incontinence Genito-Urinary ROS: negative for - dysuria, hematuria, incontinence or urinary frequency/urgency Musculoskeletal ROS: negative for - joint swelling or muscular weakness Neurological ROS: as noted in HPI Dermatological ROS: negative for rash and skin lesion changes   Blood pressure 131/79, pulse 84, temperature 98.4 F (36.9 C), temperature source Oral, resp. rate 18, height 5\' 9"  (1.753 m), weight 70.308 kg (155  lb), SpO2 100 %.   Neurologic Examination:  HEENT-  Normocephalic, no lesions, without obvious abnormality.  Normal external eye and conjunctiva.  Normal TM's bilaterally.  Normal auditory canals and external ears. Normal external nose, mucus membranes and septum.  Normal pharynx. Cardiovascular- S1, S2 normal, pulses palpable throughout   Lungs- chest clear, no wheezing, rales, normal symmetric air entry Abdomen- normal findings: bowel sounds normal Extremities- no edema Lymph-no adenopathy palpable Musculoskeletal-no joint tenderness, deformity or swelling Skin-warm and dry, no hyperpigmentation, vitiligo, or suspicious lesions  Neurological Examination Mental Status: Alert, oriented, thought content appropriate.  Speech fluent without evidence of aphasia.  Able to follow 3 step commands without difficulty. Cranial Nerves: II: Discs flat bilaterally; Visual fields grossly normal, pupils equal, round, reactive to light and accommodation III,IV, VI: ptosis not present, extra-ocular motions intact bilaterally V,VII: smile symmetric, facial light touch sensation normal bilaterally VIII: hearing normal bilaterally IX,X: uvula rises symmetrically XI: bilateral shoulder shrug XII: midline tongue extension Motor: Right : Upper extremity   5/5    Left:     Upper extremity   5/5  Lower extremity   5/5     Lower extremity   5/5 --on formal testing she did show some give way weakness and poor effort  Tone and bulk:normal tone throughout; no atrophy noted Sensory: Pinprick and light touch intact throughout, bilaterally Deep Tendon Reflexes: 2+ brisk throughout UE, KJ 3+ and AJ 2+ with NO clonus. Positive hoffman's on the left hand >right hand.  Plantars: Right: downgoing   Left: downgoing Cerebellar: normal finger-to-nose, and normal heel-to-shin test Gait: as noted above      Lab  Results: Basic Metabolic Panel:  Recent Labs Lab 10/15/15 0901  NA 141  K 3.7  CL 107  CO2 29  GLUCOSE 99  BUN 8  CREATININE 0.72  CALCIUM 8.9    Liver Function Tests:  Recent Labs Lab 10/15/15 0901  AST 42*  ALT 60*  ALKPHOS 71  BILITOT 0.4  PROT 5.8*  ALBUMIN 3.5   No results for input(s): LIPASE, AMYLASE in the last 168 hours. No results for input(s): AMMONIA in the last 168 hours.  CBC:  Recent Labs Lab 10/15/15 0901  WBC 4.8  HGB 12.7  HCT 39.7  MCV 98.0  PLT 230    Cardiac Enzymes: No results for input(s): CKTOTAL, CKMB, CKMBINDEX, TROPONINI in the last 168 hours.  Lipid Panel: No results for input(s): CHOL, TRIG, HDL, CHOLHDL, VLDL, LDLCALC in the last 168 hours.  CBG: No results for input(s): GLUCAP in the last 168 hours.  Microbiology: Results for orders placed or performed during the hospital encounter of 10/15/15  Surgical pcr screen     Status: None   Collection Time: 10/15/15  9:00 AM  Result Value Ref Range Status   MRSA, PCR NEGATIVE NEGATIVE Final   Staphylococcus aureus NEGATIVE NEGATIVE Final    Comment:        The Xpert SA Assay (FDA approved for NASAL specimens in patients over 52 years of age), is one component of a comprehensive surveillance program.  Test performance has been validated by [facility] for patients greater than or equal to 22 year old. It is not intended to diagnose infection nor to guide or monitor treatment.     Coagulation Studies: No results for input(s): LABPROT, INR in the last 72 hours.  Imaging: No results found.     Assessment and plan per attending neurologist  Etta Quill PA-C [facility] [phone]  10/19/2015, 12:39 PM   Assessment/Plan: 50 YO female  with LE weakness R>L after right L 4/5 microdiscectomy.  She has preserved strength in LE.  More notable is spastic like movements with walking and hyperreflexia. At this time would like to obtain a MRI brain and  cervical spine to evaluate the hyperreflexia in both UE and LE.   Recommend: 1) PT 2) MRI cervical spine and brain.

## 2015-10-20 ENCOUNTER — Observation Stay (HOSPITAL_COMMUNITY): Payer: BLUE CROSS/BLUE SHIELD

## 2015-10-20 DIAGNOSIS — M5126 Other intervertebral disc displacement, lumbar region: Secondary | ICD-10-CM | POA: Diagnosis not present

## 2015-10-20 DIAGNOSIS — M4802 Spinal stenosis, cervical region: Secondary | ICD-10-CM | POA: Diagnosis not present

## 2015-10-20 DIAGNOSIS — M62838 Other muscle spasm: Secondary | ICD-10-CM | POA: Diagnosis not present

## 2015-10-20 DIAGNOSIS — E237 Disorder of pituitary gland, unspecified: Secondary | ICD-10-CM

## 2015-10-20 DIAGNOSIS — R29898 Other symptoms and signs involving the musculoskeletal system: Secondary | ICD-10-CM | POA: Diagnosis not present

## 2015-10-20 MED ORDER — POTASSIUM CHLORIDE CRYS ER 20 MEQ PO TBCR
40.0000 meq | EXTENDED_RELEASE_TABLET | Freq: Every day | ORAL | Status: DC
  Administered 2015-10-20 – 2015-10-21 (×2): 40 meq via ORAL
  Filled 2015-10-20 (×2): qty 2

## 2015-10-20 MED ORDER — OXYCODONE HCL 5 MG PO TABS: 5.0000 mg | ORAL_TABLET | ORAL | Status: DC | PRN

## 2015-10-20 MED ORDER — BACLOFEN 10 MG PO TABS
10.0000 mg | ORAL_TABLET | Freq: Every day | ORAL | Status: DC
  Administered 2015-10-20: 10 mg via ORAL
  Filled 2015-10-20: qty 1

## 2015-10-20 MED ORDER — GADOBENATE DIMEGLUMINE 529 MG/ML IV SOLN
7.0000 mL | Freq: Once | INTRAVENOUS | Status: AC | PRN
  Administered 2015-10-20: 7 mL via INTRAVENOUS

## 2015-10-20 MED ORDER — MORPHINE SULFATE ER 30 MG PO TBCR: 30.0000 mg | EXTENDED_RELEASE_TABLET | Freq: Two times a day (BID) | ORAL | Status: DC

## 2015-10-20 NOTE — Progress Notes (Signed)
OT Cancellation Note  Patient Details Name: [name] [name] MRN: [id number] DOB: 06/29/1966   Cancelled Treatment:    Reason Eval/Treat Not Completed: Pt asleep on OT arrival - husband requesting OT come back later today. Will attempt to see later this afternoon if time allows.  Redmond Baseman, OTR/L Pager: [phone] 10/20/2015, 2:06 PM

## 2015-10-20 NOTE — Progress Notes (Signed)
Interval history                                                                                                                                       [name] [name] is an 50 y.o. female patient is status post lumbar spine surgery. Noted to have right leg weakness. Patient reports chronic right foot drop for more than a year. MRI of the cervical spine done for further evaluation which showed C4-5, C5-6 level moderate spinal canal stenosis due to posterior disc protrusions at these levels, worse on the right paracentral and lateral aspect causing tight spinal cord contact and complete effacement of CSF space at C5-6 level and significant cord contact with altered shape of the right lateral cord at C4-5 level. No cord signal changes or myomalacia noted. MRI brain showed an incidental pituitary lesion which needs to be further evaluated with dedicated contrast MRI of the pituitary. Patient reported having some adrenal problems in the past.  Denies any vision speech problems or any significant weakness in upper extremities,  no sensory abnormalities in upper or lower limbs or trunk, denies bladder problems.   Past Medical History  Diagnosis Date  . Adrenal gland hypofunction (HCC)     related to post op, vancomycin & lumbar injection, steroids    . Depression   . Cancer ([place])     "early evolvng melanoma "- legs   . Arthritis     hnp-lumbar   . PONV (postoperative nausea and vomiting)     last 2 surgeries less nausea  . PONV (postoperative nausea and vomiting)     none with last surgery  . Blood dyscrasia     hx probable blood clot after ankle surgery  not definitive but treated per pt  . High cholesterol   . Heart murmur     years ago  . Pneumonia ~ 2009; 2010  . History of bronchitis   . History of blood transfusion     "16 so far" (07/25/2012)  . 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 Hypertension     takes HCTZ  . History of UTI   . Family history of adverse reaction to anesthesia     mom has N/V - zofran works  . Migraines     Past Surgical History  Procedure Laterality Date  . Melanoma excision  06/2011    right lower leg; "early evolving"  . Lumbar laminectomy  03/19/2012    Procedure: MICRODISCECTOMY LUMBAR LAMINECTOMY;  Surgeon: [name], MD;  Location: [facility];  Service: Orthopedics;  Laterality: Right;  MIS Right L3-4 lateral recess decompression and Right L4-5 Microdiscectomy  . Laminectomy and microdiscectomy lumbar spine  07/25/2012    redo right L4-5 microdiscectomy  . Tonsillectomy  1977  . Cholecystectomy  2004  . Vaginal hysterectomy  1995    have Ovaries  . Repair peroneal tendons ankle  2010  right  . Bladder suspension  2006  . Shoulder arthroscopy  ~ 2009    "frozen shoulder manipulation; right" (07/25/2012)  . Lumbar laminectomy  07/25/2012    Procedure: MICRODISCECTOMY LUMBAR LAMINECTOMY;  Surgeon: [name], MD;  Location: [facility];  Service: Orthopedics;  Laterality: N/A;  Re-do Right L4-5 Microdiscectomy  . Colonoscopy    . Lumbar laminectomy N/A 02/25/2014    Procedure: Bilateral L4-5 MICRODISCECTOMY with MIS;  Surgeon: [name], MD;  Location: [facility];  Service: Orthopedics;  Laterality: N/A;  . Appendectomy    . Lumbar laminectomy N/A 09/23/2014    Procedure: RIGHT L4-5 AND L5-S1 MICRODISCECTOMIES;  Surgeon: [name], MD;  Location: [facility];  Service: Orthopedics;  Laterality: N/A;  . Breast biopsy  ~ 2010    left breast, benign   . Breast lumpectomy  ~ 2010    left  . Diagnostic laparoscopy      2 cysts removed fr. R ovary   . Lumbar laminectomy N/A 10/16/2015    Procedure: REDO MICRODISCECTOMY L4-5;  Surgeon: [name], MD;  Location: [place];  Service: Orthopedics;  Laterality: N/A;    Family History  Problem Relation Age of Onset  . Hypertension Mother   . Coronary artery disease Mother   . Hypertension Father     Social History:  reports that she has never smoked. She has never used smokeless tobacco. She  reports that she drinks about 1.2 oz of alcohol per week. She reports that she does not use illicit drugs.  Allergies:  Allergies  Allergen Reactions  . Amoxicillin Anaphylaxis, Hives and Itching  . Penicillins Anaphylaxis    Has patient had a PCN reaction causing immediate rash, facial/tongue/throat swelling, SOB or lightheadedness with hypotension: Yes Has patient had a PCN reaction causing severe rash involving mucus membranes or skin necrosis: No Has patient had a PCN reaction that required hospitalization Yes Has patient had a PCN reaction occurring within the last 10 years: No If all of the above answers are "NO", then may proceed with Cephalosporin use.   . Sulfa Antibiotics Hives and Other (See Comments)    Hives, mouth blisters   . Ciprofloxacin Hives  . Phenergan [Promethazine Hcl] Other (See Comments)    Caused "severe drop in blood pressure."  . Vancomycin Cross Reactors Rash    Pt stated she had no reaction to Vancomycin when it is given slow  . Chloroquine Other (See Comments)    Renal dysfunction  . Other Other (See Comments)    From allergy testing--C[place]eloupe  . Sulfamethoxazole Hives    Medications:                                                                                                                         Current facility-administered medications:  .  0.45 % NaCl with KCl 20 mEq / L infusion, , Intravenous, Continuous, Lanae Crumbly, PA-C, Last Rate: 75 mL/hr at 10/16/15 2209 .  0.9 %  sodium chloride infusion, 250 mL, Intravenous, Continuous, Lanae Crumbly, PA-C, 250 mL at 10/16/15 2210 .  acetaminophen (TYLENOL) tablet 650 mg, 650 mg, Oral, Q4H PRN, 650 mg at 10/20/15 1505 **OR** acetaminophen (TYLENOL) suppository 650 mg, 650 mg, Rectal, Q4H PRN, Lanae Crumbly, PA-C .  ALPRAZolam Duanne Moron) tablet 0.5 mg, 0.5 mg, Oral, QHS PRN, Lanae Crumbly, PA-C .  amphetamine-dextroamphetamine (ADDERALL) tablet 20 mg, 20 mg, Oral, Daily, Lanae Crumbly, PA-C, 20 mg  at 10/19/15 1012 .  docusate sodium (COLACE) capsule 100 mg, 100 mg, Oral, BID, Lanae Crumbly, PA-C, 100 mg at 10/20/15 [id number] .  escitalopram (LEXAPRO) tablet 20 mg, 20 mg, Oral, QAC breakfast, Lanae Crumbly, PA-C, 20 mg at 10/20/15 [id number] .  HYDROmorphone (DILAUDID) injection 1 mg, 1 mg, Intravenous, Q2H PRN, Lanae Crumbly, PA-C, 1 mg at 10/20/15 1501 .  lactated ringers infusion, , Intravenous, Continuous, Kate Sable, MD, Last Rate: 20 mL/hr at 10/16/15 1516 .  menthol-cetylpyridinium (CEPACOL) lozenge 3 mg, 1 lozenge, Oral, PRN **OR** phenol (CHLORASEPTIC) mouth spray 1 spray, 1 spray, Mouth/Throat, PRN, Lanae Crumbly, PA-C .  methocarbamol (ROBAXIN) tablet 500 mg, 500 mg, Oral, Q6H PRN, 500 mg at 10/19/15 1705 **OR** methocarbamol (ROBAXIN) 500 mg in dextrose 5 % 50 mL IVPB, 500 mg, Intravenous, Q6H PRN, Lanae Crumbly, PA-C .  morphine (MS CONTIN) 12 hr tablet 30 mg, 30 mg, Oral, Q12H, [name], MD, 30 mg at 10/20/15 [id number] .  nitrofurantoin (macrocrystal-monohydrate) (MACROBID) capsule 100 mg, 100 mg, Oral, Daily, Lanae Crumbly, PA-C, 100 mg at 10/19/15 1012 .  ondansetron (ZOFRAN) injection 4 mg, 4 mg, Intravenous, Q4H PRN, Lanae Crumbly, PA-C, 4 mg at 10/20/15 0414 .  oxyCODONE (Oxy IR/ROXICODONE) immediate release tablet 5 mg, 5 mg, Oral, Q4H PRN, [name], MD, 5 mg at 10/19/15 2119 .  polyethylene glycol (MIRALAX / GLYCOLAX) packet 17 g, 17 g, Oral, Daily PRN, Lanae Crumbly, PA-C .  potassium chloride SA (K-DUR,KLOR-CON) CR tablet 40 mEq, 40 mEq, Oral, Daily, [name], MD .  rosuvastatin (CRESTOR) tablet 20 mg, 20 mg, Oral, QHS, Lanae Crumbly, PA-C, 20 mg at 10/19/15 2119 .  sodium chloride 0.9 % injection 3 mL, 3 mL, Intravenous, Q12H, Lanae Crumbly, PA-C, 3 mL at 10/20/15 [id number] .  sodium chloride 0.9 % injection 3 mL, 3 mL, Intravenous, PRN, Lanae Crumbly, PA-C .  topiramate (TOPAMAX) tablet 150 mg, 150 mg, Oral, Daily, Lanae Crumbly, PA-C, 150 mg at 10/20/15 0826   ROS:                                                                                                                                        History obtained from the patient  General ROS: negative for - chills, fatigue, fever, night sweats, weight gain or weight loss Psychological ROS: negative  for - behavioral disorder, hallucinations, memory difficulties, mood swings or suicidal ideation Ophthalmic ROS: negative for - blurry vision, double vision, eye pain or loss of vision ENT ROS: negative for - epistaxis, nasal discharge, oral lesions, sore throat, tinnitus or vertigo Allergy and Immunology ROS: negative for - hives or itchy/watery eyes Hematological and Lymphatic ROS: negative for - bleeding problems, bruising or swollen lymph nodes Endocrine ROS: negative for - galactorrhea, hair pattern changes, polydipsia/polyuria or temperature intolerance Respiratory ROS: negative for - cough, hemoptysis, shortness of breath or wheezing Cardiovascular ROS: negative for - chest pain, dyspnea on exertion, edema or irregular heartbeat Gastrointestinal ROS: negative for - abdominal pain, diarrhea, hematemesis, nausea/vomiting or stool incontinence Genito-Urinary ROS: negative for - dysuria, hematuria, incontinence or urinary frequency/urgency Musculoskeletal ROS: negative for - joint swelling or muscular weakness Neurological ROS: as noted in HPI Dermatological ROS: negative for rash and skin lesion changes  Neurologic Examination:                                                                                                      Blood pressure 98/55, pulse 76, temperature 98.4 F (36.9 C), temperature source Oral, resp. rate 18, height 5\' 9"  (1.753 m), weight 70.308 kg (155 lb), SpO2 98 %.  Evaluation of higher integrative functions including: Level of alertness: Alert, Oriented to time, place and person Speech: fluent, no evidence of dysarthria or aphasia noted.  Test the following cranial nerves: 2-12  grossly intact Motor examination: Normal tone, bulk, full 5/5 motor strength in bilateral upper extremities with the exception of very minimal right deltoid giveaway , full strength in left lower extremity . In the right lower extremity, due to postop pain she is unable to perform hip flexion, is able to flex her right knee with significant distress, no voluntary ankle dorsiflexion or plantarflexion demonstrated.  Examination of sensation : Normal sensation to pinprick in bilateral upper extremities and left lower extremity, reports mild reduced sensation in the right lower extremity compared to left, she is not sure if it's chronic. No sensory level on the trunk.  Examination of deep tendon reflexes: 3+, very brisk in all extremities, could not elicit ankle clonus, normal plantars bilaterally Test coordination: Normal finger nose testing, with no evidence of limb appendicular ataxia or abnormal involuntary movements or tremors noted.  Gait: Deferred   Lab Results: Basic Metabolic Panel:  Recent Labs Lab 10/15/15 0901 10/19/15 1245  NA 141 143  K 3.7 3.2*  CL 107 103  CO2 29 31  GLUCOSE 99 104*  BUN 8 7  CREATININE 0.72 0.70  CALCIUM 8.9 8.7*    Liver Function Tests:  Recent Labs Lab 10/15/15 0901 10/19/15 1245  AST 42* 139*  ALT 60* 156*  ALKPHOS 71 87  BILITOT 0.4 0.8  PROT 5.8* 5.2*  ALBUMIN 3.5 2.8*   No results for input(s): LIPASE, AMYLASE in the last 168 hours. No results for input(s): AMMONIA in the last 168 hours.  CBC:  Recent Labs Lab 10/15/15 0901  WBC 4.8  HGB 12.7  HCT 39.7  MCV 98.0  PLT 230    Cardiac Enzymes: No results for input(s): CKTOTAL, CKMB, CKMBINDEX, TROPONINI in the last 168 hours.  Lipid Panel: No results for input(s): CHOL, TRIG, HDL, CHOLHDL, VLDL, LDLCALC in the last 168 hours.  CBG: No results for input(s): GLUCAP in the last 168 hours.  Microbiology: Results for orders placed or performed during the hospital encounter of  10/15/15  Surgical pcr screen     Status: None   Collection Time: 10/15/15  9:00 AM  Result Value Ref Range Status   MRSA, PCR NEGATIVE NEGATIVE Final   Staphylococcus aureus NEGATIVE NEGATIVE Final    Comment:        The Xpert SA Assay (FDA approved for NASAL specimens in patients over 61 years of age), is one component of a comprehensive surveillance program.  Test performance has been validated by [facility] for patients greater than or equal to 67 year old. It is not intended to diagnose infection nor to guide or monitor treatment.      Imaging: Dg Lumbar Spine 2-3 Views  10/16/2015  CLINICAL DATA:  L4-5 microdiskectomy. EXAM: LUMBAR SPINE - 2-3 VIEW COMPARISON:  MRI of the lumbosacral spine 10/05/2015. FINDINGS: Three lateral radiographs of the lumbosacral spine demonstrate tissue retractors and surgical instruments overlying the posterior soft tissues. On the third image a surgical instrument is at the level of L4-L5 intervertebral disc space. L4-L5 and L5-S1 osteoarthritic changes are noted. IMPRESSION: Intraoperative radiographs with surgical instrument at L4-L5. Electronically Signed   By: Fidela Salisbury M.D.   On: 10/16/2015 17:36   Mr Jeri Cos [id number] Contrast  10/19/2015  CLINICAL DATA:  Lower extremity weakness, right worse than left after microdiskectomy. Spastic gait. hyperreflexia. EXAM: MRI HEAD WITHOUT AND WITH CONTRAST MRI CERVICAL SPINE WITHOUT AND WITH CONTRAST TECHNIQUE: Multiplanar, multiecho pulse sequences of the brain and surrounding structures, and cervical spine, to include the craniocervical junction and cervicothoracic junction, were obtained without and with intravenous contrast. CONTRAST:  35mL MULTIHANCE GADOBENATE DIMEGLUMINE 529 MG/ML IV SOLN COMPARISON:  None. FINDINGS: MRI HEAD FINDINGS There is no evidence of acute infarct, intra-axial mass, intracranial hemorrhage, midline shift, or extra-axial fluid collection. Ventricles and sulci are normal. No  significant cerebral white matter disease is seen. No abnormal brain parenchymal or meningeal enhancement is seen. The pituitary gland appears mildly enlarged with suggestion of a 8-9 mm lesion along the floor of the sella which is mildly T1 hypointense relative to presumed normal pituitary gland, incompletely evaluated. There is no mass effect on the optic chiasm. Orbits are unremarkable. Paranasal sinuses and mastoid air cells are clear. Major intracranial vascular flow voids are preserved. MRI CERVICAL SPINE FINDINGS There is straightening of the normal cervical lordosis with at most trace retrolisthesis of C4 on C5 and C5 on C6. Vertebral body heights are preserved. Disc space narrowing is mild at C4-5 and moderate at C5-6. Associated degenerative endplate spurring is present at these levels. No significant vertebral marrow edema is seen. The cervical spinal cord is normal in signal. No abnormal enhancement is identified. Paraspinal soft tissues are unremarkable. C2-3:  No disc herniation or stenosis. C3-4: Mild left facet arthrosis and at most minimal uncovertebral spurring results in mild left neural foraminal stenosis. No spinal stenosis. C4-5: Broad-based posterior disc osteophyte complex with more focal right paracentral/ posterolateral component results in mild-to-moderate spinal stenosis with mild flattening of the right ventral spinal cord and mild left greater than right neural foraminal stenosis. C5-6: Broad-based posterior disc osteophyte complex asymmetric to the right  results in moderate spinal stenosis with mild cord flattening and moderate right and mild left neural foraminal stenosis. C6-7:  Minimal disc bulging without stenosis. C7-T1: Mild right facet arthrosis and minimal uncovertebral spurring result in mild right neural foraminal stenosis without spinal stenosis. IMPRESSION: 1. No acute intracranial abnormality. 2. Suspected approximately 9 mm pituitary lesion, incompletely evaluated.  Suggest correlation with laboratory studies and non urgent pituitary protocol MRI to evaluate for adenoma. 3. No cervical spinal cord signal abnormality. 4. Mid cervical disc degeneration, worst at C5-6 where there is moderate spinal stenosis and right greater than left neural foraminal stenosis. Electronically Signed   By: Logan Bores M.D.   On: 10/19/2015 17:27   Mr Cervical Spine W Wo Contrast  10/19/2015  CLINICAL DATA:  Lower extremity weakness, right worse than left after microdiskectomy. Spastic gait. hyperreflexia. EXAM: MRI HEAD WITHOUT AND WITH CONTRAST MRI CERVICAL SPINE WITHOUT AND WITH CONTRAST TECHNIQUE: Multiplanar, multiecho pulse sequences of the brain and surrounding structures, and cervical spine, to include the craniocervical junction and cervicothoracic junction, were obtained without and with intravenous contrast. CONTRAST:  40mL MULTIHANCE GADOBENATE DIMEGLUMINE 529 MG/ML IV SOLN COMPARISON:  None. FINDINGS: MRI HEAD FINDINGS There is no evidence of acute infarct, intra-axial mass, intracranial hemorrhage, midline shift, or extra-axial fluid collection. Ventricles and sulci are normal. No significant cerebral white matter disease is seen. No abnormal brain parenchymal or meningeal enhancement is seen. The pituitary gland appears mildly enlarged with suggestion of a 8-9 mm lesion along the floor of the sella which is mildly T1 hypointense relative to presumed normal pituitary gland, incompletely evaluated. There is no mass effect on the optic chiasm. Orbits are unremarkable. Paranasal sinuses and mastoid air cells are clear. Major intracranial vascular flow voids are preserved. MRI CERVICAL SPINE FINDINGS There is straightening of the normal cervical lordosis with at most trace retrolisthesis of C4 on C5 and C5 on C6. Vertebral body heights are preserved. Disc space narrowing is mild at C4-5 and moderate at C5-6. Associated degenerative endplate spurring is present at these levels. No  significant vertebral marrow edema is seen. The cervical spinal cord is normal in signal. No abnormal enhancement is identified. Paraspinal soft tissues are unremarkable. C2-3:  No disc herniation or stenosis. C3-4: Mild left facet arthrosis and at most minimal uncovertebral spurring results in mild left neural foraminal stenosis. No spinal stenosis. C4-5: Broad-based posterior disc osteophyte complex with more focal right paracentral/ posterolateral component results in mild-to-moderate spinal stenosis with mild flattening of the right ventral spinal cord and mild left greater than right neural foraminal stenosis. C5-6: Broad-based posterior disc osteophyte complex asymmetric to the right results in moderate spinal stenosis with mild cord flattening and moderate right and mild left neural foraminal stenosis. C6-7:  Minimal disc bulging without stenosis. C7-T1: Mild right facet arthrosis and minimal uncovertebral spurring result in mild right neural foraminal stenosis without spinal stenosis. IMPRESSION: 1. No acute intracranial abnormality. 2. Suspected approximately 9 mm pituitary lesion, incompletely evaluated. Suggest correlation with laboratory studies and non urgent pituitary protocol MRI to evaluate for adenoma. 3. No cervical spinal cord signal abnormality. 4. Mid cervical disc degeneration, worst at C5-6 where there is moderate spinal stenosis and right greater than left neural foraminal stenosis. Electronically Signed   By: Logan Bores M.D.   On: 10/19/2015 17:27    Assessment and plan:   [name] [name] is an 50 y.o. female patient with significant right lower extremity weakness on exam which appears to be due to  a combination of postoperative pain, with some acute on chronic weakness. The degree of weakness could not be ascertained with certainty due to poor effort from patient during my evaluation today. She does have abnormal signs suggestive of cervical myelopathy with very brisk deep tendon  reflexes. History of significant gait instability at baseline. The reason for her chronic right foot drop is also unknown. Cervical spine MRI does demonstrate segmental C4-5 level moderate spinal canal stenosis, worst on the right paracentral lateral aspect due to posterior disc protrusions. Discussed this with primary orthopedic surgeon, Dr. Louanne Skye. He will further discuss with patient about surgical options.   For symptomatic management of low back spasms and right lower extent his spasms, recommend starting baclofen 10 mg at bedtime. If she tolerates without status side effects, may add daytime doses as well in future. Incidental pituitary lesion is seen on the brain MRI. Recommend a dedicated MRI of the pituitary with and without contrast for further evaluation. Would defer endocrine lab workup to her primary care physician. Patient reported history of adrenal hypofunction in the past.   Reviewed the MRI images with her, explained the findings, answered several of her questions to her satisfaction.  Neurology service will continue to follow up. Please call for any further questions.

## 2015-10-20 NOTE — Progress Notes (Addendum)
     Subjective: 4 Days Post-Op Procedure(s) (LRB): REDO MICRODISCECTOMY L4-5 (N/A) Awake, alert this AM. Report of MRI of C-spine and brain, with and without contrast Reviewed. ? 8-9 mm Pituitary adenoma. Otherwise no acute findings in the brain or In the cervical area. C-spine MRI with 2 areas of mild stenosis C4-5 and C5-6, right side greater than left with disc-osteophyte.  Patient reports pain as moderate.    Objective:   VITALS:  Temp:  [98 F (36.7 C)-98.4 F (36.9 C)] 98.4 F (36.9 C) (01/24 0400) Pulse Rate:  [76-80] 76 (01/24 0400) Resp:  [18] 18 (01/24 0400) BP: (98-126)/(55-68) 98/55 mmHg (01/24 0400) SpO2:  [95 %-98 %] 98 % (01/24 0400)  ABD soft Intact pulses distally Incision: no drainage Right leg is swollen 3/4 inch, mild increase rubor.  Homan positive.  LABS No results for input(s): HGB, WBC, PLT in the last 72 hours.  Recent Labs  10/19/15 1245  NA 143  K 3.2*  CL 103  CO2 31  BUN 7  CREATININE 0.70  GLUCOSE 104*   No results for input(s): LABPT, INR in the last 72 hours.   Assessment/Plan: 4 Days Post-Op Procedure(s) (LRB): REDO MICRODISCECTOMY L4-5 (N/A) Prolong recumbancy, poor activity level, right leg swelling r/o DVT.  Advance diet Up with therapy Plan for discharge tomorrow  Neurology to review findings ? Pituitary adenoma, cervical stenosis. Duplex venous study of both legs to r/o DVT. NITKA,JAMES E 10/20/2015, 2:40 PM

## 2015-10-20 NOTE — Progress Notes (Signed)
Physical Therapy Treatment Patient Details Name: [name] [name] MRN: [id number] DOB: 03/27/1966 Today's Date: 10/20/2015    History of Present Illness 50 y.o. s/p REDO MICRODISCECTOMY L4-5. PMH includes arthritis, cancer, depression, PNA, hypertension, Rt shoulder arthroscopy, previous back surgeries.    PT Comments    Pt with small gains toward PT goals with less assist needed this session for sit to stand transfer. Husband present for session. Pt and husband given handout on ascend/desceend of stairs in w/c. Husband verbalized understanding and reported that he will have assistance when they return home to get up stairs.   Follow Up Recommendations  Home health PT;Supervision/Assistance - 24 hour     Equipment Recommendations  Wheelchair (measurements PT);Wheelchair cushion (measurements PT)    Recommendations for Other Services       Precautions / Restrictions Precautions Precautions: Back;Fall Precaution Comments: Reviewed back precautions Required Braces or Orthoses: Spinal Brace;Other Brace/Splint Spinal Brace: Lumbar corset Other Brace/Splint: AFO for RLE-did not use in session (it is still at home) Restrictions Weight Bearing Restrictions: No    Mobility  Bed Mobility Overal bed mobility: Needs Assistance Bed Mobility: Rolling;Sidelying to Sit Rolling: Supervision Sidelying to sit: Supervision       General bed mobility comments: supervision for safety; no physical assist needed; HOB flat and min use of bedrails  Transfers Overall transfer level: Needs assistance Equipment used: Rolling walker (2 wheeled) Transfers: Sit to/from Stand Sit to Stand: Min assist         General transfer comment: assist to power up into standing position and to gain balance upon standing; vc for hand placement and technique  Ambulation/Gait Ambulation/Gait assistance: Min guard;Min assist Ambulation Distance (Feet): 55 Feet Assistive device: Rolling walker (2  wheeled) Gait Pattern/deviations: Step-through pattern;Decreased stride length;Festinating;Ataxic;Steppage (steppage on R) Gait velocity: decreased   General Gait Details: pt with uncoordinated movements and tremors at times requiring very close guard to prevent significant LOB; pt with difficulty initiating steps with festinating like gait at times but steppage on R to compensate for foot drop   Stairs            Wheelchair Mobility    Modified [name] (Stroke Patients Only)       Balance                                    Cognition Arousal/Alertness: Awake/alert Behavior During Therapy: WFL for tasks assessed/performed Overall Cognitive Status: Within Functional Limits for tasks assessed (for simple mobility tasks)               Problem Solving: Slow processing      Exercises      General Comments        Pertinent Vitals/Pain Pain Assessment: No/denies pain Pain Intervention(s): Monitored during session;Premedicated before session    Home Living                      Prior Function            PT Goals (current goals can now be found in the care plan section) Acute Rehab PT Goals Patient Stated Goal: go home PT Goal Formulation: With patient/family Time For Goal Achievement: 10/24/15 Potential to Achieve Goals: Good Progress towards PT goals: Progressing toward goals    Frequency  Min 6X/week    PT Plan Current plan remains appropriate    Co-evaluation  End of Session Equipment Utilized During Treatment: Gait belt Activity Tolerance: Patient tolerated treatment well;Patient limited by fatigue Patient left: in chair;with call bell/phone within reach;with family/visitor present     Time: [id number] PT Time Calculation (min) (ACUTE ONLY): 41 min  Charges:  $Gait Training: 23-37 mins $Therapeutic Activity: 8-22 mins                    G Codes:      Salina April, PTA Pager: [phone]   10/20/2015, 10:59 AM

## 2015-10-21 ENCOUNTER — Observation Stay (HOSPITAL_BASED_OUTPATIENT_CLINIC_OR_DEPARTMENT_OTHER): Payer: BLUE CROSS/BLUE SHIELD

## 2015-10-21 DIAGNOSIS — E237 Disorder of pituitary gland, unspecified: Secondary | ICD-10-CM | POA: Diagnosis not present

## 2015-10-21 DIAGNOSIS — M7989 Other specified soft tissue disorders: Secondary | ICD-10-CM | POA: Diagnosis not present

## 2015-10-21 DIAGNOSIS — E876 Hypokalemia: Secondary | ICD-10-CM | POA: Diagnosis not present

## 2015-10-21 DIAGNOSIS — M5126 Other intervertebral disc displacement, lumbar region: Secondary | ICD-10-CM | POA: Diagnosis not present

## 2015-10-21 DIAGNOSIS — Z9889 Other specified postprocedural states: Secondary | ICD-10-CM | POA: Diagnosis not present

## 2015-10-21 DIAGNOSIS — M62830 Muscle spasm of back: Secondary | ICD-10-CM | POA: Diagnosis not present

## 2015-10-21 DIAGNOSIS — M4802 Spinal stenosis, cervical region: Secondary | ICD-10-CM | POA: Diagnosis present

## 2015-10-21 DIAGNOSIS — E236 Other disorders of pituitary gland: Secondary | ICD-10-CM | POA: Diagnosis present

## 2015-10-21 MED ORDER — BACLOFEN 10 MG PO TABS: 10.0000 mg | ORAL_TABLET | Freq: Every day | ORAL | Status: DC

## 2015-10-21 NOTE — Progress Notes (Signed)
Physical Therapy Treatment Patient Details Name: [name] [name] MRN: [id number] DOB: 1966-02-11 Today's Date: 10/21/2015    History of Present Illness 50 y.o. s/p REDO MICRODISCECTOMY L4-5. PMH includes arthritis, cancer, depression, PNA, hypertension, Rt shoulder arthroscopy, previous back surgeries.    PT Comments    Pt required less assist for sit to stand and demonstrated improved initiation of steps this session but still with balance deficits during functional activities. Continue to progress as tolerated with anticipated d/c home with HHPT and 24 assistance.   Follow Up Recommendations  Home health PT;Supervision/Assistance - 24 hour     Equipment Recommendations  Wheelchair (measurements PT);Wheelchair cushion (measurements PT)    Recommendations for Other Services       Precautions / Restrictions Precautions Precautions: Back;Fall Precaution Comments: Reviewed back precautions Required Braces or Orthoses: Spinal Brace;Other Brace/Splint Spinal Brace: Lumbar corset Other Brace/Splint: AFO for RLE-did not use in session (it is still at home) Restrictions Weight Bearing Restrictions: No    Mobility  Bed Mobility Overal bed mobility: Needs Assistance Bed Mobility: Rolling;Sidelying to Sit Rolling: Modified independent (Device/Increase time) Sidelying to sit: Supervision       General bed mobility comments: supervision for safety; min vc for technique of sidelying to sit; no physical assist  Transfers Overall transfer level: Needs assistance Equipment used: Rolling walker (2 wheeled) Transfers: Sit to/from Stand Sit to Stand: Min assist;Min guard         General transfer comment: min A for balance upon standing; min guard for safety; vc for safe hand placement  Ambulation/Gait Ambulation/Gait assistance: Min guard;Min assist Ambulation Distance (Feet): 50 Feet Assistive device: Rolling walker (2 wheeled) Gait Pattern/deviations: Step-through  pattern;Decreased stride length;Ataxic;Staggering left;Staggering right Gait velocity: decreased   General Gait Details: pt with less steppage gait with R LE this session; min A to prevent LOB posteriorly; pt with improved ability to inititate steps but with uncoordinated movements   Stairs            Wheelchair Mobility    Modified [name] (Stroke Patients Only)       Balance Overall balance assessment: Needs assistance Sitting-balance support: Feet supported Sitting balance-Leahy Scale: Good     Standing balance support: Bilateral upper extremity supported;Single extremity supported Standing balance-Leahy Scale: Fair Standing balance comment: pt able to maintain balance with single UE support when static standing; bilat UE required for dynamic activity                    Cognition Arousal/Alertness: Awake/alert Behavior During Therapy: WFL for tasks assessed/performed Overall Cognitive Status: Within Functional Limits for tasks assessed (for simple mobility tasks)               Problem Solving: Slow processing      Exercises      General Comments General comments (skin integrity, edema, etc.): pt reported concern about support system at home CM and CSW aware       Pertinent Vitals/Pain Pain Assessment: No/denies pain    Home Living                      Prior Function            PT Goals (current goals can now be found in the care plan section) Acute Rehab PT Goals Patient Stated Goal: go home PT Goal Formulation: With patient/family Time For Goal Achievement: 10/24/15 Potential to Achieve Goals: Good Progress towards PT goals: Progressing toward goals    Frequency  Min 6X/week    PT Plan Current plan remains appropriate    Co-evaluation             End of Session Equipment Utilized During Treatment: Gait belt Activity Tolerance: Patient tolerated treatment well;Patient limited by fatigue Patient left: in chair;with  call bell/phone within reach     Time: 1455-1532 PT Time Calculation (min) (ACUTE ONLY): 37 min  Charges:  $Gait Training: 8-22 mins $Therapeutic Activity: 8-22 mins                    G Codes:      Salina April, PTA Pager: [phone]   10/21/2015, 4:21 PM

## 2015-10-21 NOTE — Progress Notes (Signed)
Patient for discharge. No apparent distress noted. Medications and discharge instructions explained to the patient. She verbalized understanding. Copies given to her including original prescription.

## 2015-10-21 NOTE — Progress Notes (Signed)
VASCULAR LAB PRELIMINARY  PRELIMINARY  PRELIMINARY  PRELIMINARY   Bilateral lower extremity venous Doppler has been completed.  Bilateral:  No evidence of DVT, superficial thrombosis, or Baker's Cyst.   Janifer Adie, RVT, RDMS 10/21/2015, 2:30 PM

## 2015-10-21 NOTE — Progress Notes (Signed)
Occupational Therapy Treatment Patient Details Name: [name] [name] MRN: [id number] DOB: April 08, 1966 Today's Date: 10/21/2015    History of present illness 50 y.o. s/p REDO MICRODISCECTOMY L4-5. PMH includes arthritis, cancer, depression, PNA, hypertension, Rt shoulder arthroscopy, previous back surgeries.   OT comments  This 50 yo female admitted with above presents to acute OT with all goals addressed and questions/concerns addressed. We will D/C from acute OT.  Follow Up Recommendations  Home health OT;Supervision/Assistance - 24 hour    Equipment Recommendations   (toiliet aid--pt and husband ware of where they can get one)       Precautions / Restrictions Precautions Precautions: Back;Fall Precaution Comments: Pt able to state 3/3 back precautions Required Braces or Orthoses: Spinal Brace;Other Brace/Splint Spinal Brace: Lumbar corset;Applied in sitting position Other Brace/Splint: AFO for RLE--not used during session Restrictions Weight Bearing Restrictions: No              ADL                                         General ADL Comments: Pt reports this is her 5th back sx and she really feels like she knows all that she is suppose to do, she has AE kit from prior sxs that she uses for LB ADLs, but toilet tongs and states that she plans on getting them this time. I had her practice putting a wet paper towel (to simulate a wet wipe) on the tolieting tongs and she was able to do it with increased time for correct orientation of tongs prior to putting wet paper towel on it. she says she has been up today already walking , offerred to help her get up again and she politely declined stating that it had not been that long since she was up to the bathroom. When asking her husband if he sees anything that might be an issue he says getting OOB without rail and bed at regular height--pt states this is not an issue for her, that she log rolls fine and pushing up from  sidelying is not an issue either.. We talked about grooming--brushing teeth and she was alreaady aware of using 2 cups (spit and rinse) to avoid bending over the sink. SHe also states that she will have family or her hair dresser who is a good friend to help her.                 Cognition   Behavior During Therapy: WFL for tasks assessed/performed Overall Cognitive Status: Within Functional Limits for tasks assessed                                            Frequency Min 2X/week     Progress Toward Goals  OT Goals(current goals can now be found in the care plan section)  Progress towards OT goals:  (All goals addressed and questions/concerns answered)     Plan Discharge plan remains appropriate       End of Session     Activity Tolerance Patient tolerated treatment well   Patient Left in bed;with call bell/phone within reach;with family/visitor present           Time: 5102-5852 OT Time Calculation (min): 17 min  Charges: OT General Charges $OT Visit: 1  Procedure OT Treatments $Self Care/Home Management : 8-22 mins  Almon Register [phone] 10/21/2015, 12:24 PM

## 2015-10-21 NOTE — Progress Notes (Addendum)
Discussed situation with [name] [name], she did well with therapy. Her vital signs are stable and HHPT is scheduled to work with her. Her baclofen will continue and has been sent to her pharmacy. She has Rxs for Morphine ER 30 mg #40 1 po q12hours and Oxycodone 5 mg po every 4 hours prn breakthrough pain.  Doppler U/S is negative for DVT.  I believe she is ready for discharge and staying overnight will not change her home situation. She has relatives coming in  Tomorrow to provide assistance. She will use a walker and W/C for ambulation short and long distances. She should  Ask her husband to help with transfers and initiating Walking.  Dr. Dagmar Hait will see in the near future to help evaluation Pituitary adenoma. I will see her back in 7-9 days for followup. Will possibly need treatment of the areas of chronic cervical stenosis, but plan to allow her to heal from lumbar surgery.

## 2015-10-21 NOTE — Progress Notes (Signed)
Interval History:                                                                                                                      [name] [name] is an 50 y.o. female patient with right leg weakness, which prompted brain mri showing an incidental pituitary lesion.  Follow up pituitary mri showed 9 x 11 x 10 mm lesion within the central and left aspect of the sella, most consistent with a pituitary adenoma. Correlation with endocrinologic laboratory studies with endocrinology referral suggested. She is feeling better after starting baclofen to help with leg spasms and back spasms. No new neurological symptoms.   Past Medical History: Past Medical History  Diagnosis Date  . Adrenal gland hypofunction (HCC)     related to post op, vancomycin & lumbar injection, steroids    . Depression   . Cancer ([place])     "early evolvng melanoma "- legs   . Arthritis     hnp-lumbar   . PONV (postoperative nausea and vomiting)     last 2 surgeries less nausea  . PONV (postoperative nausea and vomiting)     none with last surgery  . Blood dyscrasia     hx probable blood clot after ankle surgery  not definitive but treated per pt  . High cholesterol   . Heart murmur     years ago  . Pneumonia ~ 2009; 2010  . History of bronchitis   . History of blood transfusion     "16 so far" (07/25/2012)  . 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 Hypertension     takes HCTZ  . History of UTI   . Family history of adverse reaction to anesthesia     mom has N/V - zofran works  . Migraines     Past Surgical History  Procedure Laterality Date  . Melanoma excision  06/2011    right lower leg; "early evolving"  . Lumbar laminectomy  03/19/2012    Procedure: MICRODISCECTOMY LUMBAR LAMINECTOMY;  Surgeon: [name], MD;  Location: [place];  Service: Orthopedics;  Laterality: Right;  MIS Right L3-4 lateral recess decompression and Right L4-5 Microdiscectomy  . Laminectomy and microdiscectomy lumbar spine   07/25/2012    redo right L4-5 microdiscectomy  . Tonsillectomy  1977  . Cholecystectomy  2004  . Vaginal hysterectomy  1995    have Ovaries  . Repair peroneal tendons ankle  2010    right  . Bladder suspension  2006  . Shoulder arthroscopy  ~ 2009    "frozen shoulder manipulation; right" (07/25/2012)  . Lumbar laminectomy  07/25/2012    Procedure: MICRODISCECTOMY LUMBAR LAMINECTOMY;  Surgeon: [name], MD;  Location: [place];  Service: Orthopedics;  Laterality: N/A;  Re-do Right L4-5 Microdiscectomy  . Colonoscopy    . Lumbar laminectomy N/A 02/25/2014    Procedure: Bilateral L4-5 MICRODISCECTOMY with MIS;  Surgeon: [name], MD;  Location: [place];  Service: Orthopedics;  Laterality: N/A;  .  Appendectomy    . Lumbar laminectomy N/A 09/23/2014    Procedure: RIGHT L4-5 AND L5-S1 MICRODISCECTOMIES;  Surgeon: [name], MD;  Location: [place];  Service: Orthopedics;  Laterality: N/A;  . Breast biopsy  ~ 2010    left breast, benign   . Breast lumpectomy  ~ 2010    left  . Diagnostic laparoscopy      2 cysts removed fr. R ovary   . Lumbar laminectomy N/A 10/16/2015    Procedure: REDO MICRODISCECTOMY L4-5;  Surgeon: [name], MD;  Location: [place];  Service: Orthopedics;  Laterality: N/A;    Family History: Family History  Problem Relation Age of Onset  . Hypertension Mother   . Coronary artery disease Mother   . Hypertension Father     Social History:   reports that she has never smoked. She has never used smokeless tobacco. She reports that she drinks about 1.2 oz of alcohol per week. She reports that she does not use illicit drugs.  Allergies:  Allergies  Allergen Reactions  . Amoxicillin Anaphylaxis, Hives and Itching  . Penicillins Anaphylaxis    Has patient had a PCN reaction causing immediate rash, facial/tongue/throat swelling, SOB or lightheadedness with hypotension: Yes Has patient had a PCN reaction causing severe rash involving mucus membranes or skin  necrosis: No Has patient had a PCN reaction that required hospitalization Yes Has patient had a PCN reaction occurring within the last 10 years: No If all of the above answers are "NO", then may proceed with Cephalosporin use.   . Sulfa Antibiotics Hives and Other (See Comments)    Hives, mouth blisters   . Ciprofloxacin Hives  . Phenergan [Promethazine Hcl] Other (See Comments)    Caused "severe drop in blood pressure."  . Vancomycin Cross Reactors Rash    Pt stated she had no reaction to Vancomycin when it is given slow  . Chloroquine Other (See Comments)    Renal dysfunction  . Other Other (See Comments)    From allergy testing--Canteloupe  . Sulfamethoxazole Hives     Medications:                                                                                                                         Current facility-administered medications:  .  0.45 % NaCl with KCl 20 mEq / L infusion, , Intravenous, Continuous, Lanae Crumbly, PA-C, Last Rate: 75 mL/hr at 10/16/15 2209 .  0.9 %  sodium chloride infusion, 250 mL, Intravenous, Continuous, Lanae Crumbly, PA-C, 250 mL at 10/16/15 2210 .  acetaminophen (TYLENOL) tablet 650 mg, 650 mg, Oral, Q4H PRN, 650 mg at 10/20/15 1505 **OR** acetaminophen (TYLENOL) suppository 650 mg, 650 mg, Rectal, Q4H PRN, Lanae Crumbly, PA-C .  ALPRAZolam Duanne Moron) tablet 0.5 mg, 0.5 mg, Oral, QHS PRN, Lanae Crumbly, PA-C .  amphetamine-dextroamphetamine (ADDERALL) tablet 20 mg, 20 mg, Oral, Daily, Lanae Crumbly, PA-C, 20 mg at 10/19/15 1012 .  baclofen (  LIORESAL) tablet 10 mg, 10 mg, Oral, QHS, Madason Rauls Fuller Mandril, MD, 10 mg at 10/20/15 1959 .  docusate sodium (COLACE) capsule 100 mg, 100 mg, Oral, BID, Lanae Crumbly, PA-C, 100 mg at 10/20/15 2248 .  escitalopram (LEXAPRO) tablet 20 mg, 20 mg, Oral, QAC breakfast, Lanae Crumbly, PA-C, 20 mg at 10/21/15 0859 .  HYDROmorphone (DILAUDID) injection 1 mg, 1 mg, Intravenous, Q2H PRN, Lanae Crumbly, PA-C, 1 mg  at 10/20/15 1959 .  lactated ringers infusion, , Intravenous, Continuous, Kate Sable, MD, Last Rate: 20 mL/hr at 10/16/15 1516 .  menthol-cetylpyridinium (CEPACOL) lozenge 3 mg, 1 lozenge, Oral, PRN **OR** phenol (CHLORASEPTIC) mouth spray 1 spray, 1 spray, Mouth/Throat, PRN, Lanae Crumbly, PA-C .  methocarbamol (ROBAXIN) tablet 500 mg, 500 mg, Oral, Q6H PRN, 500 mg at 10/19/15 1705 **OR** methocarbamol (ROBAXIN) 500 mg in dextrose 5 % 50 mL IVPB, 500 mg, Intravenous, Q6H PRN, Lanae Crumbly, PA-C .  morphine (MS CONTIN) 12 hr tablet 30 mg, 30 mg, Oral, Q12H, [name], MD, 30 mg at 10/20/15 2248 .  nitrofurantoin (macrocrystal-monohydrate) (MACROBID) capsule 100 mg, 100 mg, Oral, Daily, Lanae Crumbly, PA-C, 100 mg at 10/19/15 1012 .  ondansetron (ZOFRAN) injection 4 mg, 4 mg, Intravenous, Q4H PRN, Lanae Crumbly, PA-C, 4 mg at 10/20/15 1653 .  oxyCODONE (Oxy IR/ROXICODONE) immediate release tablet 5 mg, 5 mg, Oral, Q4H PRN, [name], MD, 5 mg at 10/21/15 0906 .  polyethylene glycol (MIRALAX / GLYCOLAX) packet 17 g, 17 g, Oral, Daily PRN, Lanae Crumbly, PA-C .  potassium chloride SA (K-DUR,KLOR-CON) CR tablet 40 mEq, 40 mEq, Oral, Daily, [name], MD, 40 mEq at 10/20/15 1657 .  rosuvastatin (CRESTOR) tablet 20 mg, 20 mg, Oral, QHS, Lanae Crumbly, PA-C, 20 mg at 10/20/15 2248 .  sodium chloride 0.9 % injection 3 mL, 3 mL, Intravenous, Q12H, Lanae Crumbly, PA-C, 3 mL at 10/20/15 [id number] .  sodium chloride 0.9 % injection 3 mL, 3 mL, Intravenous, PRN, Lanae Crumbly, PA-C .  topiramate (TOPAMAX) tablet 150 mg, 150 mg, Oral, Daily, Lanae Crumbly, PA-C, 150 mg at 10/20/15 [id number]   Neurologic Examination:                                                                                                      Blood pressure 97/56, pulse 85, temperature 98.1 F (36.7 C), temperature source Oral, resp. rate 18, height 5\' 9"  (1.753 m), weight 70.308 kg (155 lb), SpO2 98 %.  Evaluation of higher  integrative functions including: Level of alertness: Alert, Oriented to time, place and person Speech: fluent, no evidence of dysarthria or aphasia noted.  Test the following cranial nerves: 2-12 grossly intact Motor examination: Normal tone, bulk, full 5/5 motor strength in bilateral upper extremities with the exception of very minimal right deltoid giveaway , full strength in left lower extremity . In the right lower extremity, due to postop pain she is unable to perform hip flexion, is able to flex her right knee with significant distress,  no voluntary ankle dorsiflexion or plantarflexion demonstrated.  Examination of sensation : Normal sensation to pinprick in bilateral upper extremities and left lower extremity, reports mild reduced sensation in the right lower extremity compared to left, she is not sure if it's chronic. No sensory level on the trunk.  Examination of deep tendon reflexes: 3+, very brisk in all extremities, could not elicit ankle clonus, normal plantars bilaterally Test coordination: Normal finger nose testing, with no evidence of limb appendicular ataxia or abnormal involuntary movements or tremors noted.  Gait: Deferred    Lab Results: Basic Metabolic Panel:  Recent Labs Lab 10/15/15 0901 10/19/15 1245  NA 141 143  K 3.7 3.2*  CL 107 103  CO2 29 31  GLUCOSE 99 104*  BUN 8 7  CREATININE 0.72 0.70  CALCIUM 8.9 8.7*    Liver Function Tests:  Recent Labs Lab 10/15/15 0901 10/19/15 1245  AST 42* 139*  ALT 60* 156*  ALKPHOS 71 87  BILITOT 0.4 0.8  PROT 5.8* 5.2*  ALBUMIN 3.5 2.8*   No results for input(s): LIPASE, AMYLASE in the last 168 hours. No results for input(s): AMMONIA in the last 168 hours.  CBC:  Recent Labs Lab 10/15/15 0901  WBC 4.8  HGB 12.7  HCT 39.7  MCV 98.0  PLT 230    Cardiac Enzymes: No results for input(s): CKTOTAL, CKMB, CKMBINDEX, TROPONINI in the last 168 hours.  Lipid Panel: No results for input(s): CHOL, TRIG,  HDL, CHOLHDL, VLDL, LDLCALC in the last 168 hours.  CBG: No results for input(s): GLUCAP in the last 168 hours.  Microbiology: Results for orders placed or performed during the hospital encounter of 10/15/15  Surgical pcr screen     Status: None   Collection Time: 10/15/15  9:00 AM  Result Value Ref Range Status   MRSA, PCR NEGATIVE NEGATIVE Final   Staphylococcus aureus NEGATIVE NEGATIVE Final    Comment:        The Xpert SA Assay (FDA approved for NASAL specimens in patients over 65 years of age), is one component of a comprehensive surveillance program.  Test performance has been validated by [facility] for patients greater than or equal to 20 year old. It is not intended to diagnose infection nor to guide or monitor treatment.     Imaging: Mr Jeri Cos [id number] Contrast  10/20/2015  CLINICAL DATA:  Evaluate possible pituitary lesion. EXAM: MRI HEAD WITHOUT AND WITH CONTRAST TECHNIQUE: Multiplanar, multiecho pulse sequences of the brain and surrounding structures were obtained without and with intravenous contrast. CONTRAST:  47mL MULTIHANCE GADOBENATE DIMEGLUMINE 529 MG/ML IV SOLN COMPARISON:  Prior MRI from 10/19/2015. FINDINGS: Limited dynamic thin section sequences through the pituitary gland were performed. Sequences demonstrate a relatively hypo enhancing mass centered within the central/left aspect of the sella that measures 9 x 11 x 10 mm (craniocaudad by transverse by AP). Lesion is somewhat heterogeneously enhancing. Lesion abuts the cavernous left ICA laterally without evidence for cavernous sinus invasion. Normal flow voids seen within the cavernous ICAs bilaterally. The native pituitary gland is draped over the superior and right lateral margins of the mass. There is a prominent convex superior border to the pituitary gland itself with slight extension into the suprasellar region. The pituitary stalk is minimally deviated to the right. No significant mass effect on the optic  chiasm within the suprasellar cistern. IMPRESSION: 9 x 11 x 10 mm lesion within the central and left aspect of the sella, most consistent with a pituitary adenoma.  Correlation with endocrinologic laboratory studies with endocrinology referral suggested. Electronically Signed   By: Jeannine Boga M.D.   On: 10/20/2015 22:27   Mr Jeri Cos [id number] Contrast  10/19/2015  CLINICAL DATA:  Lower extremity weakness, right worse than left after microdiskectomy. Spastic gait. hyperreflexia. EXAM: MRI HEAD WITHOUT AND WITH CONTRAST MRI CERVICAL SPINE WITHOUT AND WITH CONTRAST TECHNIQUE: Multiplanar, multiecho pulse sequences of the brain and surrounding structures, and cervical spine, to include the craniocervical junction and cervicothoracic junction, were obtained without and with intravenous contrast. CONTRAST:  43mL MULTIHANCE GADOBENATE DIMEGLUMINE 529 MG/ML IV SOLN COMPARISON:  None. FINDINGS: MRI HEAD FINDINGS There is no evidence of acute infarct, intra-axial mass, intracranial hemorrhage, midline shift, or extra-axial fluid collection. Ventricles and sulci are normal. No significant cerebral white matter disease is seen. No abnormal brain parenchymal or meningeal enhancement is seen. The pituitary gland appears mildly enlarged with suggestion of a 8-9 mm lesion along the floor of the sella which is mildly T1 hypointense relative to presumed normal pituitary gland, incompletely evaluated. There is no mass effect on the optic chiasm. Orbits are unremarkable. Paranasal sinuses and mastoid air cells are clear. Major intracranial vascular flow voids are preserved. MRI CERVICAL SPINE FINDINGS There is straightening of the normal cervical lordosis with at most trace retrolisthesis of C4 on C5 and C5 on C6. Vertebral body heights are preserved. Disc space narrowing is mild at C4-5 and moderate at C5-6. Associated degenerative endplate spurring is present at these levels. No significant vertebral marrow edema is seen. The  cervical spinal cord is normal in signal. No abnormal enhancement is identified. Paraspinal soft tissues are unremarkable. C2-3:  No disc herniation or stenosis. C3-4: Mild left facet arthrosis and at most minimal uncovertebral spurring results in mild left neural foraminal stenosis. No spinal stenosis. C4-5: Broad-based posterior disc osteophyte complex with more focal right paracentral/ posterolateral component results in mild-to-moderate spinal stenosis with mild flattening of the right ventral spinal cord and mild left greater than right neural foraminal stenosis. C5-6: Broad-based posterior disc osteophyte complex asymmetric to the right results in moderate spinal stenosis with mild cord flattening and moderate right and mild left neural foraminal stenosis. C6-7:  Minimal disc bulging without stenosis. C7-T1: Mild right facet arthrosis and minimal uncovertebral spurring result in mild right neural foraminal stenosis without spinal stenosis. IMPRESSION: 1. No acute intracranial abnormality. 2. Suspected approximately 9 mm pituitary lesion, incompletely evaluated. Suggest correlation with laboratory studies and non urgent pituitary protocol MRI to evaluate for adenoma. 3. No cervical spinal cord signal abnormality. 4. Mid cervical disc degeneration, worst at C5-6 where there is moderate spinal stenosis and right greater than left neural foraminal stenosis. Electronically Signed   By: Logan Bores M.D.   On: 10/19/2015 17:27   Mr Cervical Spine W Wo Contrast  10/19/2015  CLINICAL DATA:  Lower extremity weakness, right worse than left after microdiskectomy. Spastic gait. hyperreflexia. EXAM: MRI HEAD WITHOUT AND WITH CONTRAST MRI CERVICAL SPINE WITHOUT AND WITH CONTRAST TECHNIQUE: Multiplanar, multiecho pulse sequences of the brain and surrounding structures, and cervical spine, to include the craniocervical junction and cervicothoracic junction, were obtained without and with intravenous contrast. CONTRAST:   29mL MULTIHANCE GADOBENATE DIMEGLUMINE 529 MG/ML IV SOLN COMPARISON:  None. FINDINGS: MRI HEAD FINDINGS There is no evidence of acute infarct, intra-axial mass, intracranial hemorrhage, midline shift, or extra-axial fluid collection. Ventricles and sulci are normal. No significant cerebral white matter disease is seen. No abnormal brain parenchymal or meningeal enhancement is seen. The  pituitary gland appears mildly enlarged with suggestion of a 8-9 mm lesion along the floor of the sella which is mildly T1 hypointense relative to presumed normal pituitary gland, incompletely evaluated. There is no mass effect on the optic chiasm. Orbits are unremarkable. Paranasal sinuses and mastoid air cells are clear. Major intracranial vascular flow voids are preserved. MRI CERVICAL SPINE FINDINGS There is straightening of the normal cervical lordosis with at most trace retrolisthesis of C4 on C5 and C5 on C6. Vertebral body heights are preserved. Disc space narrowing is mild at C4-5 and moderate at C5-6. Associated degenerative endplate spurring is present at these levels. No significant vertebral marrow edema is seen. The cervical spinal cord is normal in signal. No abnormal enhancement is identified. Paraspinal soft tissues are unremarkable. C2-3:  No disc herniation or stenosis. C3-4: Mild left facet arthrosis and at most minimal uncovertebral spurring results in mild left neural foraminal stenosis. No spinal stenosis. C4-5: Broad-based posterior disc osteophyte complex with more focal right paracentral/ posterolateral component results in mild-to-moderate spinal stenosis with mild flattening of the right ventral spinal cord and mild left greater than right neural foraminal stenosis. C5-6: Broad-based posterior disc osteophyte complex asymmetric to the right results in moderate spinal stenosis with mild cord flattening and moderate right and mild left neural foraminal stenosis. C6-7:  Minimal disc bulging without stenosis.  C7-T1: Mild right facet arthrosis and minimal uncovertebral spurring result in mild right neural foraminal stenosis without spinal stenosis. IMPRESSION: 1. No acute intracranial abnormality. 2. Suspected approximately 9 mm pituitary lesion, incompletely evaluated. Suggest correlation with laboratory studies and non urgent pituitary protocol MRI to evaluate for adenoma. 3. No cervical spinal cord signal abnormality. 4. Mid cervical disc degeneration, worst at C5-6 where there is moderate spinal stenosis and right greater than left neural foraminal stenosis. Electronically Signed   By: Logan Bores M.D.   On: 10/19/2015 17:27    Assessment and plan:   JAHNAE MACNAMARA is an 50 y.o. female patient, with improved rt leg and back spasms with baclofen. No new neuro sx.   Pituitary MRI showed  9 x 11 x 10 mm lesion within the central and left aspect of the sella, most consistent with a pituitary adenoma. Correlation with endocrinologic laboratory studies with endocrinology referral suggested.  Advised f/u with her PCP for endocrinology w/u, and repeat mri in 6 months to monitor stability.  Reviewed images with her and answered several of her questions to her satisfaction.   Will sign off. Please call if any questions.

## 2015-10-21 NOTE — Progress Notes (Signed)
     Subjective: 5 Days Post-Op Procedure(s) (LRB): REDO MICRODISCECTOMY L4-5 (N/A) Awake, alert and oriented x 4. Neurology indicates that cervical stenosis may contribute to to her generalized weakness and tremurs arms and legs. Recommended Endocrine eval to pituitary adenoma. Spoke with Dr. Dagmar Hait and since she had recent steroid coverage about the time of her surgery Friday he does not recommend Pituitary studies and recommended she follow up in his office.   Patient reports pain as moderate.    Objective:   VITALS:  Temp:  [98.1 F (36.7 C)] 98.1 F (36.7 C) (01/25 0457) Pulse Rate:  [85] 85 (01/24 2013) Resp:  [18] 18 (01/25 0457) BP: (97-133)/(56-81) 97/56 mmHg (01/25 0457) SpO2:  [98 %] 98 % (01/25 0457)  Neurologically intact ABD soft Neurovascular intact Sensation intact distally Intact pulses distally Incision: no drainage   LABS No results for input(s): HGB, WBC, PLT in the last 72 hours.  Recent Labs  10/19/15 1245  NA 143  K 3.2*  CL 103  CO2 31  BUN 7  CREATININE 0.70  GLUCOSE 104*   No results for input(s): LABPT, INR in the last 72 hours.   Assessment/Plan: 5 Days Post-Op Procedure(s) (LRB): REDO MICRODISCECTOMY L4-5 (N/A)  Up with therapy Discharge home with home health  Dr. Dagmar Hait will review her discharge summary and contact [name] to schedule an appointment.  Bufford Helms E 10/21/2015, 10:57 AM

## 2015-10-26 NOTE — Discharge Summary (Signed)
Physician Discharge Summary      Patient ID: [name] [name] MRN: [id number] DOB/AGE: 27-Jan-1966 50 y.o.  Admit date: 10/16/2015 Discharge date: 10/21/2015  Admission Diagnoses:  Active Problems:   Hypokalemia   Spinal stenosis in cervical region   Pituitary mass [facility])   S/P lumbar discectomy   Discharge Diagnoses:  Same  Past Medical History  Diagnosis Date  . Adrenal gland hypofunction (HCC)     related to post op, vancomycin & lumbar injection, steroids    . Depression   . Cancer ([place])     "early evolvng melanoma "- legs   . Arthritis     hnp-lumbar   . PONV (postoperative nausea and vomiting)     last 2 surgeries less nausea  . PONV (postoperative nausea and vomiting)     none with last surgery  . Blood dyscrasia     hx probable blood clot after ankle surgery  not definitive but treated per pt  . High cholesterol   . Heart murmur     years ago  . Pneumonia ~ 2009; 2010  . History of bronchitis   . History of blood transfusion     "16 so far" (07/25/2012)  . 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 Hypertension     takes HCTZ  . History of UTI   . Family history of adverse reaction to anesthesia     mom has N/V - zofran works  . Migraines     Surgeries: Procedure(s): REDO MICRODISCECTOMY L4-5 on 10/16/2015   Consultants:  Dr. Sherrell Puller, Neurology Hospitalist.  Discharged Condition: Improved  Hospital Course: [name] [name] is an 50 y.o. female who was admitted 10/16/2015 with a chief complaint of No chief complaint on file. , and found to have a diagnosis of <principal problem not specified>. She was brought to the operating room on 10/16/2015 and underwent the above named procedures.    She was given perioperative antibiotics:      Anti-infectives    Start     Dose/Rate Route Frequency Ordered Stop   10/16/15 0600  vancomycin (VANCOCIN) IVPB 1000 mg/200 mL premix     1,000 mg 200 mL/hr over 60 Minutes Intravenous On call to O.R. 10/15/15  1352 10/16/15 1641    she also received perioperative hydrocortisone for perioperative adrenal coverage of a past history of adrenal insufficiency. Postop day #1 she was awake alert oriented 4 displayed difficulties with transferring from sitting to standing and walking with unusual truncal instability and difficulties using her  Left leg for standing and assisting with transitioning from standing to sitting. Physical therapy and occupational therapy noted severe dependenciesin her ability to stand and to transition from sitting to standing even rolling over in bed. Dressing was dry without any erythremia and was changed on postop day #1. She was continued in  Patient and underwent further inpatient physical therapy and occupational therapy to try and improve her dependence in standing and walking prior to discharge.   Postoperative day #2 she needed to have difficulties with any and ambulation. Her  potassium returned at 3.2 on postoperative day #3. Physical therapy had noted concerns of clonus in the lower extremities. A neurology evaluation was requested. Neurology noted increased reflexes in both lower extremities at the knee and ankle. No clonus however, the findings of difficulties with balance and coordination suggested that imaging studies of both the brain and cervical spine should be performed. MRI scans were done.   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 Additionallly, the MRI scan of the cervical spine demonstrated 2 levels of  Moderate cervical stenosis  at both the C4-5 and C5-6 due to disc osteophyte complexes impressing on the ventral aspect of the thecal sac and causing some right-sided cord deformity felt to be mild to moderate. No cord signal change was noted. Foraminal narrowing was noted along the right  side at C5 and C6. Patient continued with physical therapy was started on baclofen for muscle relaxation.  Dr. Dagmar Hait, [name]'s primary care physician was contacted in regards to workup of pituitary adenoma and due to the receiving appropriate and postoperative  hydrocortisonehe recommended waiting until she is seen in follow-up at his office for further endocrinology testing.  On postoperative day #4 she was noted to have swelling of the right lower extremity diffusely. due to prolonged  recumbency and prolonged rehabilitation from single level redo microdiscectomy surgery Doppler ultrasound of the lower extremities was obtained which showed no signs of deep venous thrombosis.  On postoperative day #5 she was awake alert oriented 4 dressing changed incision without erythremia or drainage. Her vital signs are stable and she was improving in her standing and walking tolerance and improving in her independence. Therapy noted that she should have wheelchair for longer distance ambulation. She was discharged home on postoperative day #5 to continue with home health physical therapy and occupational therapy to improve her gait standing and walking tolerance. During her hospitalization she expressed concerns regarding her home situation need for her children to be home to assist her.She also noted some concern regarding her relationship with her spouse so that social services did discuss with her the various means for finding assistance with concerns of her home situation.  This patient likely has some myelopathic concerns regarding her standing and walking tolerance and her  Balance and coordination. The MRI scan of her cervical spine doesn't demonstrates moderate cervical stenosis.  She is immediately postop from due microdiscectomy at the L4-5 level. Plan is to allow her recovery from her lumbar surgery with rehabilitation. After she has recovered from her lumbar surgery, consider intervention in  regards to her cervical condition.she has been showing progressive improvement in her standing and walking tolerance and patient so that this is a good short-term goal however because of the concerns regarding her hyperreflexia,  balance and coordination difficulties,  Expect that she will require decompression of the cervical spine.  She was given sequential compression devices and early ambulation for DVT prophylaxis.  She benefited maximally from their hospital stay and there were no complications.    Recent vital signs:  Filed Vitals:   10/21/15 0457 10/21/15 1233  BP: 97/56 124/76  Pulse:  80  Temp: 98.1 F (36.7 C) 98.7 F (37.1 C)  Resp: 18 16    Recent laboratory studies:  Results for orders placed or performed during the hospital encounter of 10/16/15  Urinalysis, Routine w reflex microscopic (not at [facility])  Result Value Ref Range   Color, Urine YELLOW YELLOW   APPearance CLEAR CLEAR   Specific Gravity, Urine 1.015 1.005 - 1.030   pH 5.0 5.0 - 8.0   Glucose, UA NEGATIVE NEGATIVE mg/dL   Hgb urine dipstick NEGATIVE NEGATIVE   Bilirubin Urine NEGATIVE NEGATIVE   Ketones, ur NEGATIVE NEGATIVE mg/dL   Protein, ur NEGATIVE  NEGATIVE mg/dL   Nitrite NEGATIVE NEGATIVE   Leukocytes, UA NEGATIVE NEGATIVE  Comprehensive metabolic panel  Result Value Ref Range   Sodium 143 135 - 145 mmol/L   Potassium 3.2 (L) 3.5 - 5.1 mmol/L   Chloride 103 101 - 111 mmol/L   CO2 31 22 - 32 mmol/L   Glucose, Bld 104 (H) 65 - 99 mg/dL   BUN 7 6 - 20 mg/dL   Creatinine, Ser 0.70 0.44 - 1.00 mg/dL   Calcium 8.7 (L) 8.9 - 10.3 mg/dL   Total Protein 5.2 (L) 6.5 - 8.1 g/dL   Albumin 2.8 (L) 3.5 - 5.0 g/dL   AST 139 (H) 15 - 41 U/L   ALT 156 (H) 14 - 54 U/L   Alkaline Phosphatase 87 38 - 126 U/L   Total Bilirubin 0.8 0.3 - 1.2 mg/dL   GFR calc non Af Amer >60 >60 mL/min   GFR calc Af Amer >60 >60 mL/min   Anion gap 9 5 - 15    Discharge Medications:     Medication List    STOP taking  these medications        morphine 15 MG tablet  Commonly known as:  MSIR  Replaced by:  morphine 30 MG 12 hr tablet      TAKE these medications        ALPRAZolam 0.5 MG tablet  Commonly known as:  XANAX  Take 0.5 mg by mouth at bedtime as needed for sleep.     amphetamine-dextroamphetamine 20 MG tablet  Commonly known as:  ADDERALL  Take 20 mg by mouth daily.     baclofen 10 MG tablet  Commonly known as:  LIORESAL  Take 1 tablet (10 mg total) by mouth at bedtime.     escitalopram 20 MG tablet  Commonly known as:  LEXAPRO  Take 20 mg by mouth daily before breakfast. Reported on 10/13/2015     morphine 30 MG 12 hr tablet  Commonly known as:  MS CONTIN  Take 1 tablet (30 mg total) by mouth every 12 (twelve) hours.     nitrofurantoin (macrocrystal-monohydrate) 100 MG capsule  Commonly known as:  MACROBID  Take 100 mg by mouth daily.     oxyCODONE 5 MG immediate release tablet  Commonly known as:  Oxy IR/ROXICODONE  Take 1 tablet (5 mg total) by mouth every 4 (four) hours as needed for severe pain.     oxyCODONE-acetaminophen 5-325 MG tablet  Commonly known as:  ROXICET  Take 1 tablet by mouth every 8 (eight) hours as needed for severe pain (for breakthrough pain).     Probiotic Caps  Take 1 capsule by mouth daily.     rosuvastatin 20 MG tablet  Commonly known as:  CRESTOR  Take 20 mg by mouth daily.     topiramate 50 MG tablet  Commonly known as:  TOPAMAX  Take 150 mg by mouth daily.        Diagnostic Studies: Dg Chest 2 View  10/15/2015  CLINICAL DATA:  Heart murmur.  Bronchitis EXAM: CHEST  2 VIEW COMPARISON:  09/23/2014 . FINDINGS: Mediastinum and hilar structures normal. Lungs are clear of acute infiltrate. Heart size normal. No pleural effusion or pneumothorax. IMPRESSION: No acute cardiopulmonary disease. Electronically Signed   By: Marcello Moores  Register   On: 10/15/2015 09:38   Dg Lumbar Spine 2-3 Views  10/16/2015  CLINICAL DATA:  L4-5 microdiskectomy. EXAM:  LUMBAR SPINE - 2-3 VIEW COMPARISON:  MRI of the lumbosacral  spine 10/05/2015. FINDINGS: Three lateral radiographs of the lumbosacral spine demonstrate tissue retractors and surgical instruments overlying the posterior soft tissues. On the third image a surgical instrument is at the level of L4-L5 intervertebral disc space. L4-L5 and L5-S1 osteoarthritic changes are noted. IMPRESSION: Intraoperative radiographs with surgical instrument at L4-L5. Electronically Signed   By: Fidela Salisbury M.D.   On: 10/16/2015 17:36   Mr Jeri Cos [id number] Contrast  10/20/2015  CLINICAL DATA:  Evaluate possible pituitary lesion. EXAM: MRI HEAD WITHOUT AND WITH CONTRAST TECHNIQUE: Multiplanar, multiecho pulse sequences of the brain and surrounding structures were obtained without and with intravenous contrast. CONTRAST:  60mL MULTIHANCE GADOBENATE DIMEGLUMINE 529 MG/ML IV SOLN COMPARISON:  Prior MRI from 10/19/2015. FINDINGS: Limited dynamic thin section sequences through the pituitary gland were performed. Sequences demonstrate a relatively hypo enhancing mass centered within the central/left aspect of the sella that measures 9 x 11 x 10 mm (craniocaudad by transverse by AP). Lesion is somewhat heterogeneously enhancing. Lesion abuts the cavernous left ICA laterally without evidence for cavernous sinus invasion. Normal flow voids seen within the cavernous ICAs bilaterally. The native pituitary gland is draped over the superior and right lateral margins of the mass. There is a prominent convex superior border to the pituitary gland itself with slight extension into the suprasellar region. The pituitary stalk is minimally deviated to the right. No significant mass effect on the optic chiasm within the suprasellar cistern. IMPRESSION: 9 x 11 x 10 mm lesion within the central and left aspect of the sella, most consistent with a pituitary adenoma. Correlation with endocrinologic laboratory studies with endocrinology referral suggested.  Electronically Signed   By: Jeannine Boga M.D.   On: 10/20/2015 22:27   Mr Jeri Cos [id number] Contrast  10/19/2015  CLINICAL DATA:  Lower extremity weakness, right worse than left after microdiskectomy. Spastic gait. hyperreflexia. EXAM: MRI HEAD WITHOUT AND WITH CONTRAST MRI CERVICAL SPINE WITHOUT AND WITH CONTRAST TECHNIQUE: Multiplanar, multiecho pulse sequences of the brain and surrounding structures, and cervical spine, to include the craniocervical junction and cervicothoracic junction, were obtained without and with intravenous contrast. CONTRAST:  59mL MULTIHANCE GADOBENATE DIMEGLUMINE 529 MG/ML IV SOLN COMPARISON:  None. FINDINGS: MRI HEAD FINDINGS There is no evidence of acute infarct, intra-axial mass, intracranial hemorrhage, midline shift, or extra-axial fluid collection. Ventricles and sulci are normal. No significant cerebral white matter disease is seen. No abnormal brain parenchymal or meningeal enhancement is seen. The pituitary gland appears mildly enlarged with suggestion of a 8-9 mm lesion along the floor of the sella which is mildly T1 hypointense relative to presumed normal pituitary gland, incompletely evaluated. There is no mass effect on the optic chiasm. Orbits are unremarkable. Paranasal sinuses and mastoid air cells are clear. Major intracranial vascular flow voids are preserved. MRI CERVICAL SPINE FINDINGS There is straightening of the normal cervical lordosis with at most trace retrolisthesis of C4 on C5 and C5 on C6. Vertebral body heights are preserved. Disc space narrowing is mild at C4-5 and moderate at C5-6. Associated degenerative endplate spurring is present at these levels. No significant vertebral marrow edema is seen. The cervical spinal cord is normal in signal. No abnormal enhancement is identified. Paraspinal soft tissues are unremarkable. C2-3:  No disc herniation or stenosis. C3-4: Mild left facet arthrosis and at most minimal uncovertebral spurring results in mild  left neural foraminal stenosis. No spinal stenosis. C4-5: Broad-based posterior disc osteophyte complex with more focal right paracentral/ posterolateral component results in mild-to-moderate spinal stenosis with  mild flattening of the right ventral spinal cord and mild left greater than right neural foraminal stenosis. C5-6: Broad-based posterior disc osteophyte complex asymmetric to the right results in moderate spinal stenosis with mild cord flattening and moderate right and mild left neural foraminal stenosis. C6-7:  Minimal disc bulging without stenosis. C7-T1: Mild right facet arthrosis and minimal uncovertebral spurring result in mild right neural foraminal stenosis without spinal stenosis. IMPRESSION: 1. No acute intracranial abnormality. 2. Suspected approximately 9 mm pituitary lesion, incompletely evaluated. Suggest correlation with laboratory studies and non urgent pituitary protocol MRI to evaluate for adenoma. 3. No cervical spinal cord signal abnormality. 4. Mid cervical disc degeneration, worst at C5-6 where there is moderate spinal stenosis and right greater than left neural foraminal stenosis. Electronically Signed   By: Logan Bores M.D.   On: 10/19/2015 17:27   Mr Cervical Spine W Wo Contrast  10/19/2015  CLINICAL DATA:  Lower extremity weakness, right worse than left after microdiskectomy. Spastic gait. hyperreflexia. EXAM: MRI HEAD WITHOUT AND WITH CONTRAST MRI CERVICAL SPINE WITHOUT AND WITH CONTRAST TECHNIQUE: Multiplanar, multiecho pulse sequences of the brain and surrounding structures, and cervical spine, to include the craniocervical junction and cervicothoracic junction, were obtained without and with intravenous contrast. CONTRAST:  77mL MULTIHANCE GADOBENATE DIMEGLUMINE 529 MG/ML IV SOLN COMPARISON:  None. FINDINGS: MRI HEAD FINDINGS There is no evidence of acute infarct, intra-axial mass, intracranial hemorrhage, midline shift, or extra-axial fluid collection. Ventricles and sulci  are normal. No significant cerebral white matter disease is seen. No abnormal brain parenchymal or meningeal enhancement is seen. The pituitary gland appears mildly enlarged with suggestion of a 8-9 mm lesion along the floor of the sella which is mildly T1 hypointense relative to presumed normal pituitary gland, incompletely evaluated. There is no mass effect on the optic chiasm. Orbits are unremarkable. Paranasal sinuses and mastoid air cells are clear. Major intracranial vascular flow voids are preserved. MRI CERVICAL SPINE FINDINGS There is straightening of the normal cervical lordosis with at most trace retrolisthesis of C4 on C5 and C5 on C6. Vertebral body heights are preserved. Disc space narrowing is mild at C4-5 and moderate at C5-6. Associated degenerative endplate spurring is present at these levels. No significant vertebral marrow edema is seen. The cervical spinal cord is normal in signal. No abnormal enhancement is identified. Paraspinal soft tissues are unremarkable. C2-3:  No disc herniation or stenosis. C3-4: Mild left facet arthrosis and at most minimal uncovertebral spurring results in mild left neural foraminal stenosis. No spinal stenosis. C4-5: Broad-based posterior disc osteophyte complex with more focal right paracentral/ posterolateral component results in mild-to-moderate spinal stenosis with mild flattening of the right ventral spinal cord and mild left greater than right neural foraminal stenosis. C5-6: Broad-based posterior disc osteophyte complex asymmetric to the right results in moderate spinal stenosis with mild cord flattening and moderate right and mild left neural foraminal stenosis. C6-7:  Minimal disc bulging without stenosis. C7-T1: Mild right facet arthrosis and minimal uncovertebral spurring result in mild right neural foraminal stenosis without spinal stenosis. IMPRESSION: 1. No acute intracranial abnormality. 2. Suspected approximately 9 mm pituitary lesion, incompletely  evaluated. Suggest correlation with laboratory studies and non urgent pituitary protocol MRI to evaluate for adenoma. 3. No cervical spinal cord signal abnormality. 4. Mid cervical disc degeneration, worst at C5-6 where there is moderate spinal stenosis and right greater than left neural foraminal stenosis. Electronically Signed   By: Logan Bores M.D.   On: 10/19/2015 17:27   Mr Lumbar  Spine W Wo Contrast  10/05/2015  CLINICAL DATA:  Low back pain for several months worsening over the past 3 weeks. Superimposed on chronic low back pain for 4 years. Multiple prior back surgeries in 2013,2014, and 2016. EXAM: MRI LUMBAR SPINE WITHOUT AND WITH CONTRAST TECHNIQUE: Multiplanar and multiecho pulse sequences of the lumbar spine were obtained without and with intravenous contrast. CONTRAST:  44mL MULTIHANCE GADOBENATE DIMEGLUMINE 529 MG/ML IV SOLN Creatinine was obtained on site at [facility] at [address] Results: Creatinine 0.6 mg/dL. COMPARISON:  05/02/2014 FINDINGS: The lowest lumbar type non-rib-bearing vertebra is labeled as L5. The conus medullaris appears normal. Conus level: T12-L1. Type 1 degenerative endplate findings noted anteriorly at the L4-5 level without significant intervertebral disc edema or enhancement. 3 mm degenerative retrolisthesis at L4-5 and L5-S1. Additional findings at individual levels are as follows: L1-2: No impingement. Mild bilateral facet arthropathy and minimal disc bulge. L2-3: No impingement. Mild disc bulge mild bilateral facet arthropathy. L3-4: Mild bilateral foraminal stenosis, mild bilateral subarticular lateral recess stenosis, and borderline central narrowing of the thecal sac due to disc bulge, facet arthropathy, and left inferior foraminal disc protrusion. This disc protrusion has an extraforaminal component which displaces the left L3 nerve in the lateral extraforaminal space. Suspected subtle postoperative findings on the right at L3-4 as on image 17  series 9. L4-5: Mild to moderate right and mild left foraminal stenosis with mild right subarticular lateral recess stenosis related to facet arthropathy, disc bulge, and a small recurrent right lateral recess disc protrusion. Bilateral laminectomies with partial facetectomies. Right lateral recess epidural fibrosis is present as on image 5 series 8 but above this I believe there to be a small component of recurrent disc protrusion. Impingement at this level is slightly worse. L5-S1: Mild bilateral foraminal stenosis and borderline left subarticular lateral recess stenosis due to disc bulge, facet arthropathy, and intervertebral spurring. Prior right laminectomy. Epidural fibrosis in the right lateral recess. Impingement at this level is slightly worse. IMPRESSION: 1. Mildly progressive impingement at L4-5 and L5-S1. Lumbar spondylosis and degenerative disc disease causes mild to moderate impingement at L4-5 and mild impingement at L3-4 and L5-S1, as detailed above. Electronically Signed   By: Van Clines M.D.   On: 10/05/2015 15:17    Disposition: 01-Home or Self Care  Discharge Instructions    Call MD / Call 911    Complete by:  As directed   If you experience chest pain or shortness of breath, CALL 911 and be transported to the hospital emergency room.  If you develope a fever above 101 F, pus (white drainage) or increased drainage or redness at the wound, or calf pain, call your surgeon's office.     Call MD / Call 911    Complete by:  As directed   If you experience chest pain or shortness of breath, CALL 911 and be transported to the hospital emergency room.  If you develope a fever above 101 F, pus (white drainage) or increased drainage or redness at the wound, or calf pain, call your surgeon's office.     Constipation Prevention    Complete by:  As directed   Drink plenty of fluids.  Prune juice may be helpful.  You may use a stool softener, such as Colace (over the counter) 100 mg twice  a day.  Use MiraLax (over the counter) for constipation as needed.     Constipation Prevention    Complete by:  As directed  Drink plenty of fluids.  Prune juice may be helpful.  You may use a stool softener, such as Colace (over the counter) 100 mg twice a day.  Use MiraLax (over the counter) for constipation as needed.     Diet - low sodium heart healthy    Complete by:  As directed      Diet - low sodium heart healthy    Complete by:  As directed      Discharge instructions    Complete by:  As directed   Ok to shower 5 days postop.  Do not apply any creams or ointments to incision.  Do not remove steri-strips.  Can use 4x4 gauze and tape for dressing changes.  No aggressive activity.  No bending, twisting, squatting or prolonged sitting.  Mostly be in reclined position or lying down. Ok to do some walking.  No long distances.     Discharge instructions    Complete by:  As directed   No lifting greater than 10 lbs. Avoid bending, stooping and twisting. Walk in house for first week them may start to get out slowly increasing distance up to one quarter mile by 3 weeks post op. Keep incision dry for 3 days, may use tegaderm or similar water impervious dressing.     Driving restrictions    Complete by:  As directed   No driving until further notice.     Driving restrictions    Complete by:  As directed   No driving for 6 weeks     Increase activity slowly as tolerated    Complete by:  As directed      Increase activity slowly as tolerated    Complete by:  As directed      Lifting restrictions    Complete by:  As directed   No lifting until further notice.     Lifting restrictions    Complete by:  As directed   No lifting for 8 weeks           Follow-up Information    Follow up with NITKA,JAMES E, MD. Schedule an appointment as soon as possible for a visit in 1 week.   Specialty:  Orthopedic Surgery   Why:  need return office visit one week postop   Contact information:   [place] [address] [phone]       Follow up with [facility].   Why:  Someone from [facility] will contact you concerning start date and time for therapy.   Contact information:   [address] [place] [address] [phone]       Follow up with Tivis Ringer, MD.   Specialty:  Internal Medicine   Why:  Dr. Danna Hefty office will call and arrange for follow up in his office.   Contact information:   [address] [place] [address] [phone]        Signed: [name] 10/26/2015, 11:17 AM

## 2015-11-26 ENCOUNTER — Other Ambulatory Visit: Payer: Self-pay | Admitting: Specialist

## 2015-11-26 DIAGNOSIS — M545 Low back pain: Secondary | ICD-10-CM

## 2015-11-27 ENCOUNTER — Ambulatory Visit
Admission: RE | Admit: 2015-11-27 | Discharge: 2015-11-27 | Disposition: A | Discharge status: Home / Self Care | Payer: BLUE CROSS/BLUE SHIELD | Source: Ambulatory Visit | Attending: Specialist | Admitting: Specialist

## 2015-11-27 DIAGNOSIS — M545 Low back pain: Secondary | ICD-10-CM

## 2015-11-27 MED ORDER — GADOBENATE DIMEGLUMINE 529 MG/ML IV SOLN
15.0000 mL | Freq: Once | INTRAVENOUS | Status: AC | PRN
  Administered 2015-11-27: 15 mL via INTRAVENOUS

## 2015-12-02 ENCOUNTER — Other Ambulatory Visit: Payer: Self-pay | Admitting: Specialist

## 2015-12-02 ENCOUNTER — Other Ambulatory Visit: Payer: Self-pay

## 2015-12-02 DIAGNOSIS — Z1231 Encounter for screening mammogram for malignant neoplasm of breast: Secondary | ICD-10-CM

## 2015-12-02 DIAGNOSIS — M545 Low back pain: Secondary | ICD-10-CM

## 2015-12-21 ENCOUNTER — Ambulatory Visit: Payer: BLUE CROSS/BLUE SHIELD

## 2015-12-25 ENCOUNTER — Ambulatory Visit: Payer: BLUE CROSS/BLUE SHIELD

## 2016-01-04 ENCOUNTER — Ambulatory Visit
Admission: RE | Admit: 2016-01-04 | Discharge: 2016-01-04 | Disposition: A | Discharge status: Home / Self Care | Payer: BLUE CROSS/BLUE SHIELD | Source: Ambulatory Visit

## 2016-01-04 DIAGNOSIS — Z1231 Encounter for screening mammogram for malignant neoplasm of breast: Secondary | ICD-10-CM

## 2016-06-29 ENCOUNTER — Ambulatory Visit (INDEPENDENT_AMBULATORY_CARE_PROVIDER_SITE_OTHER): Payer: Self-pay | Admitting: Specialist

## 2016-08-31 ENCOUNTER — Ambulatory Visit (INDEPENDENT_AMBULATORY_CARE_PROVIDER_SITE_OTHER): Payer: BLUE CROSS/BLUE SHIELD | Admitting: Specialist

## 2016-08-31 ENCOUNTER — Encounter (INDEPENDENT_AMBULATORY_CARE_PROVIDER_SITE_OTHER): Payer: Self-pay | Admitting: Specialist

## 2016-08-31 ENCOUNTER — Ambulatory Visit (INDEPENDENT_AMBULATORY_CARE_PROVIDER_SITE_OTHER): Payer: BLUE CROSS/BLUE SHIELD

## 2016-08-31 ENCOUNTER — Encounter (INDEPENDENT_AMBULATORY_CARE_PROVIDER_SITE_OTHER): Payer: Self-pay

## 2016-08-31 VITALS — BP 134/85 | HR 75 | Ht 69.0 in | Wt 170.0 lb

## 2016-08-31 DIAGNOSIS — M545 Low back pain, unspecified: Secondary | ICD-10-CM

## 2016-08-31 DIAGNOSIS — M5136 Other intervertebral disc degeneration, lumbar region: Secondary | ICD-10-CM

## 2016-08-31 MED ORDER — OXYCODONE HCL 10 MG PO TABS: 10.0000 mg | ORAL_TABLET | Freq: Four times a day (QID) | ORAL | 0 refills | Status: DC | PRN

## 2016-08-31 MED ORDER — METHOCARBAMOL 500 MG PO TABS: 500.0000 mg | ORAL_TABLET | Freq: Four times a day (QID) | ORAL | 0 refills | Status: DC

## 2016-08-31 NOTE — Patient Instructions (Addendum)
Avoid frequent bending and stooping  No lifting greater than 5-10 lbs. May use ice or moist heat for pain. Weight loss is of benefit. Handicap license is approved.

## 2016-08-31 NOTE — Progress Notes (Signed)
Office Visit Note   Patient: [name] [name]           Date of Birth: 07-14-1966           MRN: [id number] Visit Date: 08/31/2016              Requested by: Prince Solian, MD [address] [place], [place] [address] PCP: Tivis Ringer, MD   Assessment & Plan: Visit Diagnoses:  1. Acute midline low back pain without sciatica   2. Lumbar degenerative disc disease     Plan: Avoid frequent bending and stooping  No lifting greater than 5-10 lbs. May use ice or moist heat for pain. Weight loss is of benefit. Handicap license is approved.   Follow-Up Instructions: Return in about 1 week (around 09/07/2016) for lumbar  and acute sciatica pain.   Orders:  Orders Placed This Encounter  Procedures  . XR Lumbar Spine 2-3 Views  . MR Lumbar Spine w/o contrast   Meds ordered this encounter  Medications  . oxyCODONE 10 MG TABS    Sig: Take 1 tablet (10 mg total) by mouth every 6 (six) hours as needed for severe pain.    Dispense:  40 tablet    Refill:  0  . methocarbamol (ROBAXIN) 500 MG tablet    Sig: Take 1 tablet (500 mg total) by mouth 4 (four) times daily.    Dispense:  60 tablet    Refill:  0      Procedures: No procedures performed   Clinical Data: No additional findings.   Subjective: Chief Complaint  Patient presents with  . Lower Back - Pain    Patient returns today with complains of low back pain. States she was moving and lift some boxes last Friday. While moving she did fall and now feels like she may have ruptured another disc. Pain radiating into bilateral legs. Right leg has some numbness and tingling. Having a burning sensation..     Review of Systems   Objective: Vital Signs: BP 134/85   Pulse 75   Ht 5\' 9"  (1.753 m)   Wt 170 lb (77.1 kg)   BMI 25.10 kg/m   Physical Exam  Ortho Exam  Specialty Comments:  No specialty comments available.  Imaging: Xr Lumbar Spine 2-3 Views  Result Date: 08/31/2016 AP and Lateral flexion  and extension radiographs Lumbar spine, shows DDD L3-4, L4-5 and L5-S1. AP with L4-5 disc space narrowing and lateral osteophytes off the disc space, left L3-4 lateral disc osteophytes. Lateral radiographs show DDD with disc space narrowing L4-5 about 80% with opening with extension. Retrolisthesis at L4-5 grade 1 worsens with extension. No anterolisthesis, spondylosis L3-4, L4-5 and L5-S1.    PMFS History: Patient Active Problem List   Diagnosis Date Noted  . Spinal stenosis of lumbar region with neurogenic claudication 03/19/2012    Priority: High    Class: Acute  . Hypokalemia 10/21/2015    Priority: Medium  . Spinal stenosis in cervical region 10/21/2015    Priority: Medium    Class: Chronic  . Pituitary mass ([place] [facility]) 10/21/2015    Priority: Medium  . Herniated nucleus pulposus, lumbar 07/25/2012    Priority: Medium    Class: Acute  . HNP (herniated nucleus pulposus), lumbar 03/19/2012    Priority: Medium    Class: Acute  . S/P lumbar discectomy 10/16/2015  . History of lumbar laminectomy for spinal cord decompression 09/23/2014  . Herniated nucleus pulposus, L4-5 02/25/2014   Past Medical History:  Diagnosis  Date  . 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 Arthritis    hnp-lumbar   . Blood dyscrasia    hx probable blood clot after ankle surgery  not definitive but treated per pt  . Cancer ([facility])    "early evolvng melanoma "- legs   . Depression   . Family history of adverse reaction to anesthesia    mom has N/V - zofran works  . Heart murmur    years ago  . High cholesterol   . History of blood transfusion    "16 so far" (07/25/2012)  . History of bronchitis   . History of UTI   . Hypertension    takes HCTZ  . 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 Migraines   . Pneumonia ~ 2009; 2010  . PONV (postoperative nausea and vomiting)    last 2 surgeries less nausea  . PONV (postoperative nausea and vomiting)     none with last surgery    Family History  Problem Relation Age of Onset  . Hypertension Mother   . Coronary artery disease Mother   . Hypertension Father     Past Surgical History:  Procedure Laterality Date  . APPENDECTOMY    . BLADDER SUSPENSION  2006  . BREAST BIOPSY  ~ 2010   left breast, benign   . BREAST LUMPECTOMY  ~ 2010   left  . CHOLECYSTECTOMY  2004  . COLONOSCOPY    . DIAGNOSTIC LAPAROSCOPY     2 cysts removed fr. R ovary   . LAMINECTOMY AND MICRODISCECTOMY LUMBAR SPINE  07/25/2012   redo right L4-5 microdiscectomy  . LUMBAR LAMINECTOMY  03/19/2012   Procedure: MICRODISCECTOMY LUMBAR LAMINECTOMY;  Surgeon: [name], MD;  Location: [facility];  Service: Orthopedics;  Laterality: Right;  MIS Right L3-4 lateral recess decompression and Right L4-5 Microdiscectomy  . LUMBAR LAMINECTOMY  07/25/2012   Procedure: MICRODISCECTOMY LUMBAR LAMINECTOMY;  Surgeon: [name], MD;  Location: [facility];  Service: Orthopedics;  Laterality: N/A;  Re-do Right L4-5 Microdiscectomy  . LUMBAR LAMINECTOMY N/A 02/25/2014   Procedure: Bilateral L4-5 MICRODISCECTOMY with MIS;  Surgeon: [name], MD;  Location: [facility];  Service: Orthopedics;  Laterality: N/A;  . LUMBAR LAMINECTOMY N/A 09/23/2014   Procedure: RIGHT L4-5 AND L5-S1 MICRODISCECTOMIES;  Surgeon: [name], MD;  Location: [facility];  Service: Orthopedics;  Laterality: N/A;  . LUMBAR LAMINECTOMY N/A 10/16/2015   Procedure: REDO MICRODISCECTOMY L4-5;  Surgeon: [name], MD;  Location: [facility];  Service: Orthopedics;  Laterality: N/A;  . MELANOMA EXCISION  06/2011   right lower leg; "early evolving"  . REPAIR PERONEAL TENDONS ANKLE  2010   right  . SHOULDER ARTHROSCOPY  ~ 2009   "frozen shoulder manipulation; right" (07/25/2012)  . TONSILLECTOMY  1977  . VAGINAL HYSTERECTOMY  1995   have Ovaries   Social History   Occupational History  . Not on file.   Social History Main Topics  . Smoking status: Never Smoker  . Smokeless  tobacco: Never Used  . Alcohol use 1.2 oz/week    2 Glasses of wine per week     Comment: occasional  . Drug use: No  . Sexual activity: Yes

## 2016-09-08 ENCOUNTER — Ambulatory Visit (INDEPENDENT_AMBULATORY_CARE_PROVIDER_SITE_OTHER): Payer: BLUE CROSS/BLUE SHIELD | Admitting: Specialist

## 2016-09-08 ENCOUNTER — Encounter (INDEPENDENT_AMBULATORY_CARE_PROVIDER_SITE_OTHER): Payer: Self-pay | Admitting: Specialist

## 2016-09-08 VITALS — Ht 69.0 in | Wt 170.0 lb

## 2016-09-08 DIAGNOSIS — Z8619 Personal history of other infectious and parasitic diseases: Secondary | ICD-10-CM | POA: Diagnosis not present

## 2016-09-08 DIAGNOSIS — M7061 Trochanteric bursitis, right hip: Secondary | ICD-10-CM | POA: Diagnosis not present

## 2016-09-08 DIAGNOSIS — M5416 Radiculopathy, lumbar region: Secondary | ICD-10-CM | POA: Diagnosis not present

## 2016-09-08 MED ORDER — LORAZEPAM 1 MG PO TABS: ORAL_TABLET | ORAL | 0 refills | Status: DC

## 2016-09-08 MED ORDER — VALACYCLOVIR HCL 1 G PO TABS: ORAL_TABLET | ORAL | 1 refills | Status: AC

## 2016-09-08 NOTE — Progress Notes (Deleted)
Office Visit Note   Patient: [name] [name]           Date of Birth: 22-Dec-1965           MRN: [id number] Visit Date: 09/08/2016              Requested by: Prince Solian, MD [address] [place], [place] [address] PCP: Tivis Ringer, MD   Assessment & Plan: Visit Diagnoses:  1. Radiculopathy, lumbar region   2. Trochanteric bursitis, right hip     Plan: The try to help give patient some relief of her lateral hip pain offered injection. Tolerated procedure well without complication.  Follow-Up Instructions: No Follow-up on file.   Orders:  Orders Placed This Encounter  Procedures  . Large Joint Injection/Arthrocentesis   No orders of the defined types were placed in this encounter.     Procedures: Large Joint Inj Date/Time: 09/08/2016 9:54 AM Performed by: Lanae Crumbly Authorized by: Lanae Crumbly   Consent Given by:  Patient Indications:  Pain Location:  Hip Site:  R greater trochanter Prep: patient was prepped and draped in usual sterile fashion   Needle Size:  22 G Needle Length:  3.5 inches Approach:  Lateral Ultrasound Guidance: No   Fluoroscopic Guidance: No   Arthrogram: No   Medications:  3 mL lidocaine 1 %; 80 mg methylPREDNISolone acetate 40 MG/ML; 6 mL bupivacaine 0.25 % Aspiration Attempted: No   Patient tolerance:  Patient tolerated the procedure well with no immediate complications     Clinical Data: No additional findings.   Subjective: Chief Complaint  Patient presents with  . Lower Back - Pain    Patient is coming in for 1 week recheck. She is scheduled for MRI on 09/10/2016. States her low back pain is severe. Having a lot of pain in low back and right leg.    Patient continues complaining of right sided sciatica but she also has a considerable amount of discomfort right lateral hip. Pain laterally she is ambulating and she cannot lay on her right side at night due to discomfort. States that she does not have pain  going below her knee. Review of Systems  HENT: Negative.   Cardiovascular: Negative.   Musculoskeletal: Positive for back pain and gait problem.  Psychiatric/Behavioral: Negative.      Objective: Vital Signs: Ht 5\' 9"  (1.753 m)   Wt 170 lb (77.1 kg)   BMI 25.10 kg/m   Physical Exam  Constitutional: She is oriented to person, place, and time. No distress.  HENT:  Head: Normocephalic and atraumatic.  Eyes: EOM are normal. Pupils are equal, round, and reactive to light.  Pulmonary/Chest: No respiratory distress.  Musculoskeletal:  Patient has a Trendelenburg gait.  Right logroll causes pain over the lateral hip. She is markedly tender over the right hip greater bursa. Negative straight leg raise. Bilateral calves are nontender.  Neurological: She is alert and oriented to person, place, and time.    Ortho Exam  Specialty Comments:  No specialty comments available.  Imaging: No results found.   PMFS History: Patient Active Problem List   Diagnosis Date Noted  . Trochanteric bursitis, right hip 09/08/2016  . Hypokalemia 10/21/2015  . Spinal stenosis in cervical region 10/21/2015    Class: Chronic  . Pituitary mass ([facility]) 10/21/2015  . S/P lumbar discectomy 10/16/2015  . History of lumbar laminectomy for spinal cord decompression 09/23/2014  . Herniated nucleus pulposus, L4-5 02/25/2014  . Herniated nucleus pulposus, lumbar 07/25/2012  Class: Acute  . HNP (herniated nucleus pulposus), lumbar 03/19/2012    Class: Acute  . Spinal stenosis of lumbar region with neurogenic claudication 03/19/2012    Class: Acute   Past Medical History:  Diagnosis Date  . 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 Arthritis    hnp-lumbar   . Blood dyscrasia    hx probable blood clot after ankle surgery  not definitive but treated per pt  . Cancer ([facility])    "early evolvng melanoma "- legs   . Depression   . Family history of  adverse reaction to anesthesia    mom has N/V - zofran works  . Heart murmur    years ago  . High cholesterol   . History of blood transfusion    "16 so far" (07/25/2012)  . History of bronchitis   . History of UTI   . Hypertension    takes HCTZ  . 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 Migraines   . Pneumonia ~ 2009; 2010  . PONV (postoperative nausea and vomiting)    last 2 surgeries less nausea  . PONV (postoperative nausea and vomiting)    none with last surgery    Family History  Problem Relation Age of Onset  . Hypertension Mother   . Coronary artery disease Mother   . Hypertension Father     Past Surgical History:  Procedure Laterality Date  . APPENDECTOMY    . BLADDER SUSPENSION  2006  . BREAST BIOPSY  ~ 2010   left breast, benign   . BREAST LUMPECTOMY  ~ 2010   left  . CHOLECYSTECTOMY  2004  . COLONOSCOPY    . DIAGNOSTIC LAPAROSCOPY     2 cysts removed fr. R ovary   . LAMINECTOMY AND MICRODISCECTOMY LUMBAR SPINE  07/25/2012   redo right L4-5 microdiscectomy  . LUMBAR LAMINECTOMY  03/19/2012   Procedure: MICRODISCECTOMY LUMBAR LAMINECTOMY;  Surgeon: [name], MD;  Location: [facility];  Service: Orthopedics;  Laterality: Right;  MIS Right L3-4 lateral recess decompression and Right L4-5 Microdiscectomy  . LUMBAR LAMINECTOMY  07/25/2012   Procedure: MICRODISCECTOMY LUMBAR LAMINECTOMY;  Surgeon: [name], MD;  Location: [facility];  Service: Orthopedics;  Laterality: N/A;  Re-do Right L4-5 Microdiscectomy  . LUMBAR LAMINECTOMY N/A 02/25/2014   Procedure: Bilateral L4-5 MICRODISCECTOMY with MIS;  Surgeon: [name], MD;  Location: [facility];  Service: Orthopedics;  Laterality: N/A;  . LUMBAR LAMINECTOMY N/A 09/23/2014   Procedure: RIGHT L4-5 AND L5-S1 MICRODISCECTOMIES;  Surgeon: [name], MD;  Location: [facility];  Service: Orthopedics;  Laterality: N/A;  . LUMBAR LAMINECTOMY N/A 10/16/2015   Procedure: REDO MICRODISCECTOMY L4-5;  Surgeon: [name], MD;  Location:  [facility];  Service: Orthopedics;  Laterality: N/A;  . MELANOMA EXCISION  06/2011   right lower leg; "early evolving"  . REPAIR PERONEAL TENDONS ANKLE  2010   right  . SHOULDER ARTHROSCOPY  ~ 2009   "frozen shoulder manipulation; right" (07/25/2012)  . TONSILLECTOMY  1977  . VAGINAL HYSTERECTOMY  1995   have Ovaries   Social History   Occupational History  . Not on file.   Social History Main Topics  . Smoking status: Never Smoker  . Smokeless tobacco: Never Used  . Alcohol use 1.2 oz/week    2 Glasses of wine per week     Comment: occasional  . Drug use: No  .  Sexual activity: Yes

## 2016-09-09 ENCOUNTER — Encounter (INDEPENDENT_AMBULATORY_CARE_PROVIDER_SITE_OTHER): Payer: Self-pay | Admitting: Specialist
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
## 2016-09-10 ENCOUNTER — Ambulatory Visit
Admission: RE | Admit: 2016-09-10 | Discharge: 2016-09-10 | Disposition: A | Discharge status: Home / Self Care | Payer: BLUE CROSS/BLUE SHIELD | Source: Ambulatory Visit | Attending: Specialist | Admitting: Specialist

## 2016-09-14 ENCOUNTER — Ambulatory Visit (INDEPENDENT_AMBULATORY_CARE_PROVIDER_SITE_OTHER): Payer: Self-pay

## 2016-09-14 ENCOUNTER — Ambulatory Visit (INDEPENDENT_AMBULATORY_CARE_PROVIDER_SITE_OTHER): Payer: BLUE CROSS/BLUE SHIELD | Admitting: Specialist

## 2016-09-14 ENCOUNTER — Encounter (INDEPENDENT_AMBULATORY_CARE_PROVIDER_SITE_OTHER): Payer: Self-pay | Admitting: Specialist

## 2016-09-14 ENCOUNTER — Telehealth (INDEPENDENT_AMBULATORY_CARE_PROVIDER_SITE_OTHER): Payer: Self-pay | Admitting: Specialist

## 2016-09-14 VITALS — BP 135/75 | HR 78 | Ht 69.0 in | Wt 170.0 lb

## 2016-09-14 DIAGNOSIS — M25561 Pain in right knee: Secondary | ICD-10-CM

## 2016-09-14 DIAGNOSIS — M545 Low back pain, unspecified: Secondary | ICD-10-CM

## 2016-09-14 DIAGNOSIS — G8929 Other chronic pain: Secondary | ICD-10-CM | POA: Diagnosis not present

## 2016-09-14 MED ORDER — OXYCODONE HCL 10 MG PO TABS: 10.0000 mg | ORAL_TABLET | Freq: Four times a day (QID) | ORAL | 0 refills | Status: DC | PRN

## 2016-09-14 NOTE — Progress Notes (Signed)
Office Visit Note   Patient: [name] [name]           Date of Birth: 1966/03/09           MRN: [id number] Visit Date: 09/14/2016              Requested by: Prince Solian, MD [address] [place], [place] [address] PCP: Tivis Ringer, MD   Assessment & Plan: Visit Diagnoses:  1. Chronic bilateral low back pain without sciatica   2. Acute midline low back pain without sciatica   3. Acute pain of right knee     Plan:Avoid frequent bending and stooping  No lifting greater than 10 lbs. May use ice or moist heat for pain. Weight loss is of benefit. Handicap license is approved.    Follow-Up Instructions: Return in about 2 weeks (around 09/28/2016).   Orders:  Orders Placed This Encounter  Procedures  . MR LUMBAR SPINE W CONTRAST  . XR Knee Complete 4 Views Right   Meds ordered this encounter  Medications  . Oxycodone HCl 10 MG TABS    Sig: Take 1 tablet (10 mg total) by mouth every 6 (six) hours as needed.    Dispense:  40 tablet    Refill:  0      Procedures: No procedures performed   Clinical Data: No additional findings.   Subjective: Chief Complaint  Patient presents with  . Lower Back - Pain    Patient is returning to follow up with MRI Lspine. She is experiencing sever pain in her back and into both legs, Pain in the right leg in the right buttock and into the right lateral thigh but not into the right foot, right leg with more than usual numbness into the right foot into the right lateral calf and the right lateral foot. Pain with sneezing, and with sitting. Standing and stooping help to decrease pain but eventually stooping increases the pain. She is moving items from her apartment. Having increased pain with recent fall at home.    Review of Systems  HENT: Negative.   Eyes: Negative.   Respiratory: Negative.   Cardiovascular: Negative.   Gastrointestinal: Negative.   Endocrine: Negative.   Genitourinary: Negative.   Musculoskeletal:  Positive for back pain.  Allergic/Immunologic: Negative.   Neurological: Positive for weakness and numbness.  Hematological: Negative.   Psychiatric/Behavioral: Negative.      Objective: Vital Signs: BP 135/75   Pulse 78   Ht 5\' 9"  (1.753 m)   Wt 170 lb (77.1 kg)   BMI 25.10 kg/m   Physical Exam  Constitutional: She is oriented to person, place, and time. She appears well-developed and well-nourished.  Eyes: EOM are normal. Pupils are equal, round, and reactive to light.  Neck: Normal range of motion. Neck supple.  Pulmonary/Chest: Effort normal and breath sounds normal.  Abdominal: Soft. Bowel sounds are normal.  Musculoskeletal: She exhibits no edema, tenderness or deformity.       Right knee: She exhibits no effusion.  Neurological: She is alert and oriented to person, place, and time.  Skin: Skin is warm and dry.  Psychiatric: She has a normal mood and affect. Her behavior is normal. Judgment and thought content normal.    Right Knee Exam  Right knee exam is normal.  Tenderness  The patient is experiencing tenderness in the patella and medial retinaculum.  Range of Motion  Extension: normal  Flexion: normal   Tests  McMurray:  Medial - positive  Lachman:  Anterior - negative    Posterior - negative Drawer:       Anterior - negative    Posterior - negative Pivot Shift: negative Patellar Apprehension: negative  Other  Erythema: absent Scars: absent Sensation: normal Pulse: present Swelling: mild Other tests: no effusion present   Back Exam   Tenderness  The patient is experiencing tenderness in the lumbar.  Range of Motion  Extension: normal  Flexion: normal  Lateral Bend Right: abnormal  Lateral Bend Left: abnormal  Rotation Right: abnormal  Rotation Left: abnormal   Tests  Straight leg raise right: positive Straight leg raise left: positive  Reflexes  Patellar: normal Achilles: abnormal Babinski's sign: normal   Other  Toe Walk:  abnormal Heel Walk: abnormal Sensation: decreased Gait: abnormal  Erythema: no back redness Scars: absent      Specialty Comments:  No specialty comments available.  Imaging: Xr Knee Complete 4 Views Right  Result Date: 09/14/2016 AP and lateral and patellofemoral sunrise views show no fracture, dislocation or subluxation.    PMFS History: Patient Active Problem List   Diagnosis Date Noted  . Spinal stenosis of lumbar region with neurogenic claudication 03/19/2012    Priority: High    Class: Acute  . Hypokalemia 10/21/2015    Priority: Medium  . Spinal stenosis in cervical region 10/21/2015    Priority: Medium    Class: Chronic  . Pituitary mass ([facility]) 10/21/2015    Priority: Medium  . Herniated nucleus pulposus, lumbar 07/25/2012    Priority: Medium    Class: Acute  . HNP (herniated nucleus pulposus), lumbar 03/19/2012    Priority: Medium    Class: Acute  . Trochanteric bursitis, right hip 09/08/2016  . S/P lumbar discectomy 10/16/2015  . History of lumbar laminectomy for spinal cord decompression 09/23/2014  . Herniated nucleus pulposus, L4-5 02/25/2014   Past Medical History:  Diagnosis Date  . 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 Arthritis    hnp-lumbar   . Blood dyscrasia    hx probable blood clot after ankle surgery  not definitive but treated per pt  . Cancer ([facility])    "early evolvng melanoma "- legs   . Depression   . Family history of adverse reaction to anesthesia    mom has N/V - zofran works  . Heart murmur    years ago  . High cholesterol   . History of blood transfusion    "16 so far" (07/25/2012)  . History of bronchitis   . History of UTI   . Hypertension    takes HCTZ  . 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 Migraines   . Pneumonia ~ 2009; 2010  . PONV (postoperative nausea and vomiting)    last 2 surgeries less nausea  . PONV (postoperative nausea and vomiting)     none with last surgery    Family History  Problem Relation Age of Onset  . Hypertension Mother   . Coronary artery disease Mother   . Hypertension Father     Past Surgical History:  Procedure Laterality Date  . APPENDECTOMY    . BLADDER SUSPENSION  2006  . BREAST BIOPSY  ~ 2010   left breast, benign   . BREAST LUMPECTOMY  ~ 2010   left  . CHOLECYSTECTOMY  2004  . COLONOSCOPY    . DIAGNOSTIC LAPAROSCOPY     2 cysts removed fr. R ovary   .  LAMINECTOMY AND MICRODISCECTOMY LUMBAR SPINE  07/25/2012   redo right L4-5 microdiscectomy  . LUMBAR LAMINECTOMY  03/19/2012   Procedure: MICRODISCECTOMY LUMBAR LAMINECTOMY;  Surgeon: [name], MD;  Location: [facility];  Service: Orthopedics;  Laterality: Right;  MIS Right L3-4 lateral recess decompression and Right L4-5 Microdiscectomy  . LUMBAR LAMINECTOMY  07/25/2012   Procedure: MICRODISCECTOMY LUMBAR LAMINECTOMY;  Surgeon: [name], MD;  Location: [facility];  Service: Orthopedics;  Laterality: N/A;  Re-do Right L4-5 Microdiscectomy  . LUMBAR LAMINECTOMY N/A 02/25/2014   Procedure: Bilateral L4-5 MICRODISCECTOMY with MIS;  Surgeon: [name], MD;  Location: [facility];  Service: Orthopedics;  Laterality: N/A;  . LUMBAR LAMINECTOMY N/A 09/23/2014   Procedure: RIGHT L4-5 AND L5-S1 MICRODISCECTOMIES;  Surgeon: [name], MD;  Location: [facility];  Service: Orthopedics;  Laterality: N/A;  . LUMBAR LAMINECTOMY N/A 10/16/2015   Procedure: REDO MICRODISCECTOMY L4-5;  Surgeon: [name], MD;  Location: [facility];  Service: Orthopedics;  Laterality: N/A;  . MELANOMA EXCISION  06/2011   right lower leg; "early evolving"  . REPAIR PERONEAL TENDONS ANKLE  2010   right  . SHOULDER ARTHROSCOPY  ~ 2009   "frozen shoulder manipulation; right" (07/25/2012)  . TONSILLECTOMY  1977  . VAGINAL HYSTERECTOMY  1995   have Ovaries   Social History   Occupational History  . Not on file.   Social History Main Topics  . Smoking status: Never Smoker  . Smokeless  tobacco: Never Used  . Alcohol use 1.2 oz/week    2 Glasses of wine per week     Comment: occasional  . Drug use: No  . Sexual activity: Yes

## 2016-09-14 NOTE — Patient Instructions (Signed)
Avoid frequent bending and stooping  No lifting greater than 10 lbs. May use ice or moist heat for pain. Weight loss is of benefit. Handicap license is approved.   

## 2016-09-14 NOTE — Telephone Encounter (Signed)
Appointment sch'd for 09/29/16 @ 8:30 per Nira Conn

## 2016-09-14 NOTE — Telephone Encounter (Signed)
Per heather can you please open up [place] for pt on 1/4 at 830?  Thank you  [phone]

## 2016-09-16 ENCOUNTER — Other Ambulatory Visit: Payer: BLUE CROSS/BLUE SHIELD

## 2016-09-25 ENCOUNTER — Ambulatory Visit
Admission: RE | Admit: 2016-09-25 | Discharge: 2016-09-25 | Disposition: A | Discharge status: Home / Self Care | Payer: BLUE CROSS/BLUE SHIELD | Source: Ambulatory Visit | Attending: Specialist | Admitting: Specialist

## 2016-09-25 DIAGNOSIS — M545 Low back pain, unspecified: Secondary | ICD-10-CM

## 2016-09-25 MED ORDER — GADOBENATE DIMEGLUMINE 529 MG/ML IV SOLN
15.0000 mL | Freq: Once | INTRAVENOUS | Status: AC | PRN
  Administered 2016-09-25: 15 mL via INTRAVENOUS

## 2016-09-29 ENCOUNTER — Ambulatory Visit (INDEPENDENT_AMBULATORY_CARE_PROVIDER_SITE_OTHER): Payer: BLUE CROSS/BLUE SHIELD | Admitting: Specialist

## 2016-09-29 ENCOUNTER — Encounter (INDEPENDENT_AMBULATORY_CARE_PROVIDER_SITE_OTHER): Payer: Self-pay | Admitting: Specialist

## 2016-09-29 VITALS — BP 117/74 | HR 86 | Ht 69.5 in | Wt 165.0 lb

## 2016-09-29 DIAGNOSIS — M5126 Other intervertebral disc displacement, lumbar region: Secondary | ICD-10-CM

## 2016-09-29 DIAGNOSIS — M48062 Spinal stenosis, lumbar region with neurogenic claudication: Secondary | ICD-10-CM

## 2016-09-29 DIAGNOSIS — M5416 Radiculopathy, lumbar region: Secondary | ICD-10-CM | POA: Diagnosis not present

## 2016-09-29 NOTE — Patient Instructions (Addendum)
Dr. Elyse Hsu, endocrinology. Continue with [facility], knowing that likely will need L4-5 fusion with pedicle screws, rods and cage and local and vivigen bone graft.Plan to decompress bilateral L3-4 level for lateral recess stenosis and the left L5-S1 for lateral recess stenosis.  Avoid frequent bending and stooping  No lifting greater than 10 lbs. May use ice or moist heat for pain. Weight loss is of benefit. Handicap license is approved.  Scheduling secretary will contact you to arrange for surgery. Sherrie Berneda Rose.

## 2016-09-29 NOTE — Progress Notes (Signed)
Office Visit Note   Patient: [name] [name]           Date of Birth: 05/18/1966           MRN: [id number] Visit Date: 09/29/2016              Requested by: Prince Solian, MD [address] [place], [place] [address] PCP: Tivis Ringer, MD   Assessment & Plan: Visit Diagnoses:  1. Recurrent herniation of lumbar disc   2. Acute right lumbar radiculopathy     Plan: Dr. Elyse Hsu, endocrinology. Continue with [facility], knowing that likely will need L4-5 fusion with pedicle screws, rods and cage and local and vivigen bone graft. Avoid frequent bending and stooping  No lifting greater than 10 lbs. May use ice or moist heat for pain. Weight loss is of benefit. Handicap license is approved.  Scheduling secretary will contact you to arrange for surgery. Sherrie Berneda Rose.    Follow-Up Instructions: Return in about 3 weeks (around 10/20/2016).   Orders:  No orders of the defined types were placed in this encounter.  No orders of the defined types were placed in this encounter.     Procedures: No procedures performed   Clinical Data: No additional findings.   Subjective: Chief Complaint  Patient presents with  . Lower Back - Follow-up    Post MRI 09/25/2016    [name] [name] is here to follow up from MRI Lumbar spine with contrast.  She states that her symptoms are still the same.  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 Wendie Chess,  ENT, Dr. Roxy Cedar Endocrinology and Dr. Salomon Fick Neurosurgery. Expected to seen Dr. Salomon Fick 10/25/2015 and undergo CT and MRI follow up. Still with severe back, buttock and right greater than left  Leg pain into the right lateral thigh and calf. Numbness and right leg weakness. Needs to consider obtaining a job to be able to afford and be gainful.Right calf and lateral thigh and calf aching constant pain.  Taking Oxycodone      Review of Systems  HENT: Positive for congestion, postnasal drip, rhinorrhea, sinus pain, sinus pressure, sneezing, sore throat and trouble swallowing.   Eyes: Positive for photophobia, discharge, redness and visual disturbance.  Respiratory: Negative.   Cardiovascular: Positive for leg swelling. Negative for chest pain and palpitations.  Gastrointestinal: Negative.   Endocrine: Positive for polydipsia and polyuria. Negative for cold intolerance and heat intolerance.  Genitourinary: Negative for difficulty urinating, dyspareunia, dysuria and enuresis.  Musculoskeletal: Positive for back pain, gait problem, neck pain and neck stiffness.  Skin: Negative.   Allergic/Immunologic: Positive for food allergies.  Neurological: Positive for weakness and numbness. Negative for dizziness, tremors, seizures, syncope, facial asymmetry, speech difficulty, light-headedness and headaches.  Hematological: Negative for adenopathy. Bruises/bleeds easily.  Psychiatric/Behavioral: Negative for agitation, behavioral problems, confusion, decreased concentration, dysphoric mood, hallucinations, self-injury, sleep disturbance and suicidal ideas. The patient is nervous/anxious. The patient is not hyperactive.      Objective: Vital Signs: BP 117/74 (BP Location: Left Arm, Patient Position: Sitting)   Pulse 86   Ht 5' 9.5" (1.765 m)   Wt 165 lb (74.8 kg)   BMI 24.02 kg/m   Physical Exam  Constitutional: She is oriented to person, place, and time. She appears well-developed and well-nourished.  HENT:  Head: Normocephalic and atraumatic.  Eyes: EOM are normal. Pupils are  equal, round, and reactive to light.  Pulmonary/Chest: Effort normal and breath sounds normal.  Abdominal: Soft. Bowel sounds are normal.  Neurological: She is alert and oriented to person, place, and time.  Skin: Skin is warm and dry.  Psychiatric: She has a normal mood and affect. Her behavior is normal. Judgment and  thought content normal.    Back Exam   Tenderness  The patient is experiencing tenderness in the lumbar and cervical.  Range of Motion  Extension: abnormal  Flexion: normal  Lateral Bend Right: abnormal  Lateral Bend Left: normal  Rotation Right: normal  Rotation Left: normal   Muscle Strength  Right Quadriceps:  5/5  Left Quadriceps:  5/5  Right Hamstrings:  5/5  Left Hamstrings:  5/5   Tests  Straight leg raise right: negative Straight leg raise left: negative  Reflexes  Patellar: normal Achilles: abnormal Biceps: normal Babinski's sign: normal   Other  Toe Walk: normal Heel Walk: normal Sensation: normal Erythema: no back redness Scars: absent      Specialty Comments:  No specialty comments available.  Imaging: No results found.   PMFS History: Patient Active Problem List   Diagnosis Date Noted  . Spinal stenosis of lumbar region with neurogenic claudication 03/19/2012    Priority: High    Class: Acute  . Hypokalemia 10/21/2015    Priority: Medium  . Spinal stenosis in cervical region 10/21/2015    Priority: Medium    Class: Chronic  . 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 Herniated nucleus pulposus, lumbar 07/25/2012    Priority: Medium    Class: Acute  . HNP (herniated nucleus pulposus), lumbar 03/19/2012    Priority: Medium    Class: Acute  . Trochanteric bursitis, right hip 09/08/2016  . S/P lumbar discectomy 10/16/2015  . History of lumbar laminectomy for spinal cord decompression 09/23/2014  . Herniated nucleus pulposus, L4-5 02/25/2014   Past Medical History:  Diagnosis Date  . 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 Arthritis    hnp-lumbar   . Blood dyscrasia    hx probable blood clot after ankle surgery  not definitive but treated per pt  . Cancer ([place])    "early evolvng melanoma "- legs   . Depression   . Family history of adverse reaction to  anesthesia    mom has N/V - zofran works  . Heart murmur    years ago  . High cholesterol   . History of blood transfusion    "16 so far" (07/25/2012)  . History of bronchitis   . History of UTI   . Hypertension    takes HCTZ  . 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 Migraines   . Pneumonia ~ 2009; 2010  . PONV (postoperative nausea and vomiting)    last 2 surgeries less nausea  . PONV (postoperative nausea and vomiting)    none with last surgery    Family History  Problem Relation Age of Onset  . Hypertension Mother   . Coronary artery disease Mother   . Hypertension Father     Past Surgical History:  Procedure Laterality Date  . APPENDECTOMY    . BLADDER SUSPENSION  2006  . BREAST BIOPSY  ~ 2010   left breast, benign   . BREAST LUMPECTOMY  ~ 2010   left  . CHOLECYSTECTOMY  2004  . COLONOSCOPY    . DIAGNOSTIC  LAPAROSCOPY     2 cysts removed fr. R ovary   . LAMINECTOMY AND MICRODISCECTOMY LUMBAR SPINE  07/25/2012   redo right L4-5 microdiscectomy  . LUMBAR LAMINECTOMY  03/19/2012   Procedure: MICRODISCECTOMY LUMBAR LAMINECTOMY;  Surgeon: [name], MD;  Location: [facility];  Service: Orthopedics;  Laterality: Right;  MIS Right L3-4 lateral recess decompression and Right L4-5 Microdiscectomy  . LUMBAR LAMINECTOMY  07/25/2012   Procedure: MICRODISCECTOMY LUMBAR LAMINECTOMY;  Surgeon: [name], MD;  Location: [facility];  Service: Orthopedics;  Laterality: N/A;  Re-do Right L4-5 Microdiscectomy  . LUMBAR LAMINECTOMY N/A 02/25/2014   Procedure: Bilateral L4-5 MICRODISCECTOMY with MIS;  Surgeon: [name], MD;  Location: [facility];  Service: Orthopedics;  Laterality: N/A;  . LUMBAR LAMINECTOMY N/A 09/23/2014   Procedure: RIGHT L4-5 AND L5-S1 MICRODISCECTOMIES;  Surgeon: [name], MD;  Location: [facility];  Service: Orthopedics;  Laterality: N/A;  . LUMBAR LAMINECTOMY N/A 10/16/2015   Procedure: REDO MICRODISCECTOMY L4-5;  Surgeon: [name], MD;  Location: [facility];  Service:  Orthopedics;  Laterality: N/A;  . MELANOMA EXCISION  06/2011   right lower leg; "early evolving"  . REPAIR PERONEAL TENDONS ANKLE  2010   right  . SHOULDER ARTHROSCOPY  ~ 2009   "frozen shoulder manipulation; right" (07/25/2012)  . TONSILLECTOMY  1977  . VAGINAL HYSTERECTOMY  1995   have Ovaries   Social History   Occupational History  . Not on file.   Social History Main Topics  . Smoking status: Never Smoker  . Smokeless tobacco: Never Used  . Alcohol use 1.2 oz/week    2 Glasses of wine per week     Comment: occasional  . Drug use: No  . Sexual activity: Yes

## 2016-10-20 ENCOUNTER — Encounter (INDEPENDENT_AMBULATORY_CARE_PROVIDER_SITE_OTHER): Payer: Self-pay | Admitting: Specialist

## 2016-10-20 ENCOUNTER — Ambulatory Visit (INDEPENDENT_AMBULATORY_CARE_PROVIDER_SITE_OTHER): Payer: BLUE CROSS/BLUE SHIELD | Admitting: Specialist

## 2016-10-20 VITALS — BP 108/73 | HR 80 | Ht 69.5 in | Wt 165.0 lb

## 2016-10-20 DIAGNOSIS — M48062 Spinal stenosis, lumbar region with neurogenic claudication: Secondary | ICD-10-CM

## 2016-10-20 DIAGNOSIS — G9601 Cranial cerebrospinal fluid leak, spontaneous: Secondary | ICD-10-CM

## 2016-10-20 DIAGNOSIS — M4802 Spinal stenosis, cervical region: Secondary | ICD-10-CM | POA: Diagnosis not present

## 2016-10-20 DIAGNOSIS — M961 Postlaminectomy syndrome, not elsewhere classified: Secondary | ICD-10-CM

## 2016-10-20 DIAGNOSIS — M5126 Other intervertebral disc displacement, lumbar region: Secondary | ICD-10-CM

## 2016-10-20 DIAGNOSIS — M5416 Radiculopathy, lumbar region: Secondary | ICD-10-CM | POA: Diagnosis not present

## 2016-10-20 DIAGNOSIS — G960 Cerebrospinal fluid leak: Secondary | ICD-10-CM

## 2016-10-20 MED ORDER — OXYCODONE-ACETAMINOPHEN 5-325 MG PO TABS: 1.0000 | ORAL_TABLET | Freq: Three times a day (TID) | ORAL | 0 refills | Status: DC | PRN

## 2016-10-20 MED ORDER — HYDROCOD POLST-CPM POLST ER 10-8 MG/5ML PO SUER: 5.0000 mL | Freq: Every evening | ORAL | 0 refills | Status: DC | PRN

## 2016-10-20 NOTE — Progress Notes (Addendum)
Office Visit Note   Patient: [name] [name]           Date of Birth: [birth year]           MRN: [id number] Visit Date: 10/20/2016              Requested by: Prince Solian, MD [address] [place], [place] [address] PCP: Tivis Ringer, MD   Assessment & Plan: Visit Diagnoses:  1. Subacute right lumbar radiculopathy   2. Spinal stenosis of cervical region   3. CSF leak from nose   4. Recurrent herniation of lumbar disc   5. Spinal stenosis of lumbar region with neurogenic claudication   6. Lumbar post-laminectomy syndrome     Plan: Avoid frequent bending and stooping  No lifting greater than 10 lbs. May use ice or moist heat for pain. Weight loss is of benefit. Handicap license is approved.   Follow-Up Instructions: Return in about 4 weeks (around 11/17/2016).   Orders:  No orders of the defined types were placed in this encounter.  Meds ordered this encounter  Medications  . DISCONTD: oxyCODONE-acetaminophen (ROXICET) 5-325 MG tablet    Sig: Take 1-2 tablets by mouth every 8 (eight) hours as needed for severe pain (for breakthrough pain).    Dispense:  50 tablet    Refill:  0  . chlorpheniramine-HYDROcodone (TUSSIONEX PENNKINETIC ER) 10-8 MG/5ML SUER    Sig: Take 5 mLs by mouth at bedtime as needed for cough.    Dispense:  140 mL    Refill:  0      Procedures: No procedures performed   Clinical Data: No additional findings.   Subjective: Chief Complaint  Patient presents with  . Lower Back - Follow-up    [name] is here for follow up of her back.  She is doing about the same as far as her back. She states that she had the flu 2 weeks ago and that has set her back. She is requesting something for pain today. Has had a flu about 3 weeks, was seen 4 weeks ago here. Was told to consider admit to the hospital but did not want to go, now has fatigue still. Bowel and bladder is functioning normally. MRI is scheduled for next week, also with  Dr.Tatter. ENT is working with her to eval the delayed healing of her CSF leak.     Review of Systems  Constitutional: Negative.   HENT: Negative.   Eyes: Negative.   Respiratory: Negative.   Cardiovascular: Negative.   Gastrointestinal: Negative.   Endocrine: Negative.   Genitourinary: Negative.   Musculoskeletal: Negative.   Skin: Negative.   Allergic/Immunologic: Negative.   Neurological: Negative.   Hematological: Negative.   Psychiatric/Behavioral: Negative.      Objective: Vital Signs: BP 108/73 (BP Location: Left Arm, Patient Position: Sitting)   Pulse 80   Ht 5' 9.5" (1.765 m)   Wt 165 lb (74.8 kg)   BMI 24.02 kg/m   Physical Exam  Constitutional: She is oriented to person, place, and time. She appears well-developed and well-nourished.  HENT:  Head: Normocephalic and atraumatic.  Eyes: EOM are normal. Pupils are equal, round, and reactive to light.  Neck: Normal range of motion. Neck supple.  Pulmonary/Chest: Effort normal and breath sounds normal.  Abdominal: Soft. Bowel sounds are normal.  Neurological: She is alert and oriented to person, place, and time.  Skin: Skin is warm and dry.  Psychiatric: She has a normal mood and affect. Her behavior  is normal. Judgment and thought content normal.    Back Exam   Tenderness  The patient is experiencing tenderness in the lumbar.  Range of Motion  Extension: abnormal  Flexion: abnormal  Lateral Bend Right: abnormal  Lateral Bend Left: abnormal  Rotation Right: abnormal  Rotation Left: abnormal   Muscle Strength  Right Quadriceps:  5/5  Left Quadriceps:  5/5  Right Hamstrings:  5/5  Left Hamstrings:  5/5   Tests  Straight leg raise right: positive  Reflexes  Patellar:  0/4 abnormal Achilles: 0/4 Babinski's sign: normal   Other  Toe Walk: abnormal Heel Walk: abnormal      Specialty Comments:  No specialty comments available.  Imaging: No results found.   PMFS History: Patient  Active Problem List   Diagnosis Date Noted  . Spinal stenosis of lumbar region with neurogenic claudication 03/19/2012    Priority: High    Class: Acute  . Hypokalemia 10/21/2015    Priority: Medium  . Spinal stenosis in cervical region 10/21/2015    Priority: Medium    Class: Chronic  . 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 Herniated nucleus pulposus, lumbar 07/25/2012    Priority: Medium    Class: Acute  . HNP (herniated nucleus pulposus), lumbar 03/19/2012    Priority: Medium    Class: Acute  . Trochanteric bursitis, right hip 09/08/2016  . S/P lumbar discectomy 10/16/2015  . History of lumbar laminectomy for spinal cord decompression 09/23/2014  . Herniated nucleus pulposus, L4-5 02/25/2014   Past Medical History:  Diagnosis Date  . 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 Arthritis    hnp-lumbar   . Blood dyscrasia    hx probable blood clot after ankle surgery  not definitive but treated per pt  . Cancer ([place])    "early evolvng melanoma "- legs   . Depression   . Family history of adverse reaction to anesthesia    mom has N/V - zofran works  . Heart murmur    years ago  . High cholesterol   . History of blood transfusion    "16 so far" (07/25/2012)  . History of bronchitis   . History of UTI   . Hypertension    takes HCTZ  . 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 Migraines   . Pneumonia ~ 2009; 2010  . PONV (postoperative nausea and vomiting)    last 2 surgeries less nausea  . PONV (postoperative nausea and vomiting)    none with last surgery    Family History  Problem Relation Age of Onset  . Hypertension Mother   . Coronary artery disease Mother   . Hypertension Father     Past Surgical History:  Procedure Laterality Date  . APPENDECTOMY    . BLADDER SUSPENSION  2006  . BREAST BIOPSY  ~ 2010   left breast, benign   . BREAST LUMPECTOMY  ~ 2010   left  . CHOLECYSTECTOMY   2004  . COLONOSCOPY    . DIAGNOSTIC LAPAROSCOPY     2 cysts removed fr. R ovary   . LAMINECTOMY AND MICRODISCECTOMY LUMBAR SPINE  07/25/2012   redo right L4-5 microdiscectomy  . LUMBAR LAMINECTOMY  03/19/2012   Procedure: MICRODISCECTOMY LUMBAR LAMINECTOMY;  Surgeon: [name], MD;  Location: [facility];  Service: Orthopedics;  Laterality: Right;  MIS Right L3-4 lateral recess decompression and Right L4-5 Microdiscectomy  .  LUMBAR LAMINECTOMY  07/25/2012   Procedure: MICRODISCECTOMY LUMBAR LAMINECTOMY;  Surgeon: [name], MD;  Location: [facility];  Service: Orthopedics;  Laterality: N/A;  Re-do Right L4-5 Microdiscectomy  . LUMBAR LAMINECTOMY N/A 02/25/2014   Procedure: Bilateral L4-5 MICRODISCECTOMY with MIS;  Surgeon: [name], MD;  Location: [facility];  Service: Orthopedics;  Laterality: N/A;  . LUMBAR LAMINECTOMY N/A 09/23/2014   Procedure: RIGHT L4-5 AND L5-S1 MICRODISCECTOMIES;  Surgeon: [name], MD;  Location: [facility];  Service: Orthopedics;  Laterality: N/A;  . LUMBAR LAMINECTOMY N/A 10/16/2015   Procedure: REDO MICRODISCECTOMY L4-5;  Surgeon: [name], MD;  Location: [facility];  Service: Orthopedics;  Laterality: N/A;  . MELANOMA EXCISION  06/2011   right lower leg; "early evolving"  . REPAIR PERONEAL TENDONS ANKLE  2010   right  . SHOULDER ARTHROSCOPY  ~ 2009   "frozen shoulder manipulation; right" (07/25/2012)  . TONSILLECTOMY  1977  . VAGINAL HYSTERECTOMY  1995   have Ovaries   Social History   Occupational History  . Not on file.   Social History Main Topics  . Smoking status: Never Smoker  . Smokeless tobacco: Never Used  . Alcohol use 1.2 oz/week    2 Glasses of wine per week     Comment: occasional  . Drug use: No  . Sexual activity: Yes

## 2016-10-20 NOTE — Patient Instructions (Signed)
Avoid frequent bending and stooping  No lifting greater than 10 lbs. May use ice or moist heat for pain. Weight loss is of benefit. Handicap license is approved.   

## 2016-10-27 DIAGNOSIS — Z113 Encounter for screening for infections with a predominantly sexual mode of transmission: Secondary | ICD-10-CM | POA: Diagnosis not present

## 2016-10-27 DIAGNOSIS — N959 Unspecified menopausal and perimenopausal disorder: Secondary | ICD-10-CM | POA: Diagnosis not present

## 2016-11-03 ENCOUNTER — Telehealth (INDEPENDENT_AMBULATORY_CARE_PROVIDER_SITE_OTHER): Payer: Self-pay | Admitting: Specialist

## 2016-11-03 DIAGNOSIS — F3341 Major depressive disorder, recurrent, in partial remission: Secondary | ICD-10-CM | POA: Diagnosis not present

## 2016-11-03 NOTE — Telephone Encounter (Signed)
Pt called to ask if Dr. Louanne Skye received her note yesterday. She said she really needs a call back to discuss something stated in note she left as she is going out of country tomorrow.   [phone]

## 2016-11-04 ENCOUNTER — Other Ambulatory Visit (INDEPENDENT_AMBULATORY_CARE_PROVIDER_SITE_OTHER): Payer: Self-pay | Admitting: Specialist

## 2016-11-04 DIAGNOSIS — M545 Low back pain, unspecified: Secondary | ICD-10-CM

## 2016-11-04 DIAGNOSIS — M5416 Radiculopathy, lumbar region: Secondary | ICD-10-CM

## 2016-11-04 DIAGNOSIS — M4802 Spinal stenosis, cervical region: Secondary | ICD-10-CM

## 2016-11-04 DIAGNOSIS — G8929 Other chronic pain: Secondary | ICD-10-CM

## 2016-11-04 MED ORDER — DOXYCYCLINE HYCLATE 100 MG PO CAPS: 100.0000 mg | ORAL_CAPSULE | Freq: Two times a day (BID) | ORAL | 0 refills | Status: DC

## 2016-11-04 MED ORDER — OXYCODONE-ACETAMINOPHEN 5-325 MG PO TABS: 1.0000 | ORAL_TABLET | ORAL | 0 refills | Status: DC | PRN

## 2016-11-04 NOTE — Progress Notes (Unsigned)
A message from this patient received today indicating that the studies done at [facility] showed no recurrence of pituitary tumor Also she is noted by ENT specialist to have healing of the area of the cribriform plate and upper sinuses and post resection of pituary tumor ENT specialist seeing her has recommended if she requires surgical intervention at this be done with no nasal instrumentation andfull knowledge that she has the area of weakness in the upper sinuses that is any instrumentation or placement of a nasogastric tube or otherwise.she indicates that she needs refill of her additional's for and wishes to go ahead with scheduling in regards to mental of recurrent disc herniation in the lumbar spine. As she has had 5 recu herniations at the L4-5 level lumbar fusion is recommended here. Decompression should be carried out in those areas where stenosis is present. I think that her best chances of seeing success and relief of pain and prevention of recurrent disc herniations is to limit fusion to the L4-5 level. And perform bilateral decompressions at L3-4 and L5-S1.she understands the risks of infection, bleeding and risk to the nerve roots themselves associated with surgical intervention. Additionally will require perioperative adrenal coverage and coverage for panhypopituitary. Listing the help of a new condyle inches with a most beneficial here. I renewed her medications were oxycodone in the form of Percocet 5/325#60 one or 2 every 4-6 hours when necessary for pain. In addition placed her on doxycycline 100 mg twice a day for 30 doses to be taken for malaria prevention while traveling to [place] which she is doing to celebrate the recent news and having graduated from [employer].

## 2016-11-04 NOTE — Telephone Encounter (Signed)
Pt called to ask if Dr. Louanne Skye received her note yesterday. She said she really needs a call back to discuss something stated in note she left as she is going out of country tomorrow.------I put the note on your desk.

## 2016-11-04 NOTE — Progress Notes (Signed)
Patient picked up her rx

## 2016-11-07 NOTE — Telephone Encounter (Signed)
I returned her call, Rx for medication for pain printed and signed for her to pick up. She wants to go ahead with scheduling for lumbar surgery. I will send surgery scheduling sheet to [facility]. jen

## 2016-11-08 NOTE — Telephone Encounter (Signed)
Patient picked up last week.

## 2016-11-11 NOTE — Progress Notes (Signed)
Mrs. [name] [name] left a letter requesting renewal of her medications for pain and for a prescription for doxycycline for anti malarial prophylaxis. The letter discussed with this patient. She is planning travel to the [place]. With her history of recent CNS surgery and delayed healing with CSF leak would be important to prophylaxis vs Malaria so doxycycline approved. Renewal of medications to control her pain also done. She indicates that recent visits with Dr. Salomon Fick Neurosurgery, Dr. Wendie Chess ENT and Dr. Quay Burow Endocrinology have indicated that she is In better condition to consider intervention for recurrent HNP right L4-5. She wishes to go ahead with scheduling of lumbar surgery. She will be available for communication about intervention for her lumbar spine.   I reviewed her most recent MRI and MRI with contrast of the lumbar spine. Her clinical exam and past history of previous 4 disc herniation surgeries at the L4-5 level. She has a recurrent HNP right L4-5, modic changes at the L4-5 level with disc narrowing and foramenal narrowiing, right sided lateral recess narrowing and impingment on the passing right L5 nerve root. She has mild to moderate lumbar stenosis at L3-4 due to congenital short pedicles and thickening of the ligamentum flavum bilaterally. Additional Findings of left sided foramenal narrowing due to a disc bulge or protusion that abuttes the left L3 nerve root, not as impressive on the MRI with enhancement. Also left sided L5-S1 lateral recess narrowing due to disc bulge or protrusion subarticular on the left side that may  Impress on the left S1 nerve root. She had previous right L5-S1 microdiscectomy but no modic changes at the L5-S1 level. My recommendation is to treat the problem of recurrent herniated discs at the L4-5 level and modic changes here with right sided decompression and interbody fusion with mPACT pedicle screws and rods and decompress those areas of stenosis due to bulging  discs and hypertrophic ligmentum flavum changes with bilateral lateral recess decompression at L3-4 and left L5-S1 via partial hemilaminectomies, inorder to prevent increasing stiffness and  progression of degenerative disc disease with multilevel fusion. 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

## 2016-11-17 ENCOUNTER — Telehealth (INDEPENDENT_AMBULATORY_CARE_PROVIDER_SITE_OTHER): Payer: Self-pay | Admitting: Specialist

## 2016-11-17 ENCOUNTER — Other Ambulatory Visit (INDEPENDENT_AMBULATORY_CARE_PROVIDER_SITE_OTHER): Payer: Self-pay | Admitting: Specialist

## 2016-11-17 DIAGNOSIS — M4802 Spinal stenosis, cervical region: Secondary | ICD-10-CM

## 2016-11-17 DIAGNOSIS — M5416 Radiculopathy, lumbar region: Secondary | ICD-10-CM

## 2016-11-17 MED ORDER — OXYCODONE-ACETAMINOPHEN 5-325 MG PO TABS: 1.0000 | ORAL_TABLET | ORAL | 0 refills | Status: DC | PRN

## 2016-11-17 NOTE — Telephone Encounter (Signed)
Patient called needing Rx refilled on her pain medicine. The number to contact her is [phone]

## 2016-11-17 NOTE — Telephone Encounter (Signed)
Patient called needing Rx refilled on her pain medicine

## 2016-11-17 NOTE — Telephone Encounter (Signed)
Rx printed and signed to be picked up by this patient. jen

## 2016-11-18 DIAGNOSIS — F4323 Adjustment disorder with mixed anxiety and depressed mood: Secondary | ICD-10-CM | POA: Diagnosis not present

## 2016-11-18 NOTE — Progress Notes (Signed)
lmom that her rx is ready for pick up

## 2016-11-18 NOTE — Telephone Encounter (Signed)
lmom that her rx is ready for pick up

## 2016-11-22 ENCOUNTER — Other Ambulatory Visit: Payer: Self-pay | Admitting: Physician Assistant

## 2016-11-22 DIAGNOSIS — D485 Neoplasm of uncertain behavior of skin: Secondary | ICD-10-CM | POA: Diagnosis not present

## 2016-11-22 DIAGNOSIS — L089 Local infection of the skin and subcutaneous tissue, unspecified: Secondary | ICD-10-CM | POA: Diagnosis not present

## 2016-11-23 DIAGNOSIS — R262 Difficulty in walking, not elsewhere classified: Secondary | ICD-10-CM | POA: Diagnosis not present

## 2016-11-28 ENCOUNTER — Ambulatory Visit (INDEPENDENT_AMBULATORY_CARE_PROVIDER_SITE_OTHER): Payer: BLUE CROSS/BLUE SHIELD | Admitting: Specialist

## 2016-12-01 ENCOUNTER — Encounter (HOSPITAL_COMMUNITY): Payer: Self-pay

## 2016-12-02 ENCOUNTER — Encounter (HOSPITAL_COMMUNITY): Payer: Self-pay

## 2016-12-02 ENCOUNTER — Encounter (HOSPITAL_COMMUNITY)
Admission: RE | Admit: 2016-12-02 | Discharge: 2016-12-02 | Disposition: A | Discharge status: Home / Self Care | Payer: BLUE CROSS/BLUE SHIELD | Source: Ambulatory Visit | Attending: Specialist | Admitting: Specialist

## 2016-12-02 ENCOUNTER — Other Ambulatory Visit (INDEPENDENT_AMBULATORY_CARE_PROVIDER_SITE_OTHER): Payer: Self-pay | Admitting: Family

## 2016-12-02 DIAGNOSIS — M5416 Radiculopathy, lumbar region: Secondary | ICD-10-CM

## 2016-12-02 DIAGNOSIS — M48061 Spinal stenosis, lumbar region without neurogenic claudication: Secondary | ICD-10-CM | POA: Insufficient documentation

## 2016-12-02 DIAGNOSIS — M4802 Spinal stenosis, cervical region: Secondary | ICD-10-CM

## 2016-12-02 DIAGNOSIS — Z01812 Encounter for preprocedural laboratory examination: Secondary | ICD-10-CM | POA: Diagnosis not present

## 2016-12-02 HISTORY — DX: Peripheral vascular disease, unspecified: I73.9

## 2016-12-02 HISTORY — DX: Diabetes insipidus: E23.2

## 2016-12-02 LAB — SURGICAL PCR SCREEN
MRSA, PCR: NEGATIVE
STAPHYLOCOCCUS AUREUS: NEGATIVE

## 2016-12-02 LAB — CBC
HCT: 42.5 % (ref 36.0–46.0)
Hemoglobin: 13.7 g/dL (ref 12.0–15.0)
MCH: 30.9 pg (ref 26.0–34.0)
MCHC: 32.2 g/dL (ref 30.0–36.0)
MCV: 95.9 fL (ref 78.0–100.0)
PLATELETS: 272 10*3/uL (ref 150–400)
RBC: 4.43 MIL/uL (ref 3.87–5.11)
RDW: 14 % (ref 11.5–15.5)
WBC: 6 10*3/uL (ref 4.0–10.5)

## 2016-12-02 LAB — COMPREHENSIVE METABOLIC PANEL
ALBUMIN: 4.1 g/dL (ref 3.5–5.0)
ALT: 16 U/L (ref 14–54)
AST: 20 U/L (ref 15–41)
Alkaline Phosphatase: 96 U/L (ref 38–126)
Anion gap: 7 (ref 5–15)
BUN: 16 mg/dL (ref 6–20)
CHLORIDE: 105 mmol/L (ref 101–111)
CO2: 28 mmol/L (ref 22–32)
CREATININE: 0.79 mg/dL (ref 0.44–1.00)
Calcium: 9.5 mg/dL (ref 8.9–10.3)
GFR calc Af Amer: >60 mL/min (ref >60)
GFR calc non Af Amer: >60 mL/min (ref >60)
GLUCOSE: 102 mg/dL — AB (ref 65–99)
Potassium: 4.1 mmol/L (ref 3.5–5.1)
SODIUM: 140 mmol/L (ref 135–145)
Total Bilirubin: 0.5 mg/dL (ref 0.3–1.2)
Total Protein: 6.8 g/dL (ref 6.5–8.1)

## 2016-12-02 LAB — TYPE AND SCREEN
ABO/RH(D): A POS
Antibody Screen: NEGATIVE

## 2016-12-02 LAB — URINALYSIS, ROUTINE W REFLEX MICROSCOPIC
Bilirubin Urine: NEGATIVE
GLUCOSE, UA: NEGATIVE mg/dL
Hgb urine dipstick: NEGATIVE
Ketones, ur: NEGATIVE mg/dL
Leukocytes, UA: NEGATIVE
Nitrite: NEGATIVE
PH: 6 (ref 5.0–8.0)
PROTEIN: NEGATIVE mg/dL
Specific Gravity, Urine: 1.005 (ref 1.005–1.030)

## 2016-12-02 LAB — PROTIME-INR
INR: 0.88
Prothrombin Time: 12 seconds (ref 11.4–15.2)

## 2016-12-02 LAB — APTT: aPTT: 27 seconds (ref 24–36)

## 2016-12-02 LAB — ABO/RH: ABO/RH(D): A POS

## 2016-12-02 MED ORDER — OXYCODONE-ACETAMINOPHEN 5-325 MG PO TABS
1.0000 | ORAL_TABLET | ORAL | 0 refills | Status: DC | PRN
Start: 2016-12-02 — End: 2016-12-10

## 2016-12-02 NOTE — Pre-Procedure Instructions (Addendum)
    [name] [name]  12/02/2016      [place], [place] [address] Phone: [phone] Fax: [phone]    Your procedure is scheduled on Tuesday, December 06, 2016  Report to [facility] Admitting at 5:30 A.M.  Call this number if you have problems the morning of surgery:  [phone]   Remember:  Do not eat food or drink liquids after midnight Monday, December 05, 2016  Take these medicines the morning of surgery with A SIP OF WATER : desmopressin (DDAVP), topiramate (TOPAMAX), if needed: oxyCODONE for pain,   Stop taking Aspirin,vitamins, fish oil and herbal medications. Do not take any NSAIDs ie: Ibuprofen, Advil, Naproxen ( Anaprox), BC and Goody Powder or any medication containing Aspirin; probiotic stop now  .  Do not wear jewelry, make-up or nail polish.  Do not wear lotions, powders, or perfumes, or deoderant.  Do not shave 48 hours prior to surgery.    Do not bring valuables to the hospital.  [facility] is not responsible for any belongings or valuables. Contacts, dentures or bridgework may not be worn into surgery.  Leave your suitcase in the car.  After surgery it may be brought to your room. For patients admitted to the hospital, discharge time will be determined by your treatment team. Special instructions: Shower the night before surgery and the morning of surgery with CHG. Please read over the fact sheets that you were given.

## 2016-12-05 ENCOUNTER — Encounter (HOSPITAL_COMMUNITY): Payer: Self-pay

## 2016-12-05 MED ORDER — CLINDAMYCIN PHOSPHATE 900 MG/50ML IV SOLN
900.0000 mg | INTRAVENOUS | Status: AC
  Administered 2016-12-06 (×2): 900 mg via INTRAVENOUS
  Filled 2016-12-05 (×2): qty 50

## 2016-12-06 ENCOUNTER — Inpatient Hospital Stay (HOSPITAL_COMMUNITY): Payer: BLUE CROSS/BLUE SHIELD

## 2016-12-06 ENCOUNTER — Encounter (HOSPITAL_COMMUNITY): Payer: Self-pay | Admitting: *Deleted

## 2016-12-06 ENCOUNTER — Encounter (HOSPITAL_COMMUNITY)
Admission: RE | Disposition: A | Discharge status: Home Health | Payer: Self-pay | Source: Ambulatory Visit | Attending: Specialist

## 2016-12-06 ENCOUNTER — Inpatient Hospital Stay (HOSPITAL_COMMUNITY): Payer: BLUE CROSS/BLUE SHIELD | Admitting: Certified Registered Nurse Anesthetist

## 2016-12-06 ENCOUNTER — Inpatient Hospital Stay (HOSPITAL_COMMUNITY)
Admission: RE | Admit: 2016-12-06 | Discharge: 2016-12-10 | DRG: 460 | Disposition: A | Discharge status: Home Health | Payer: BLUE CROSS/BLUE SHIELD | Source: Ambulatory Visit | Attending: Specialist | Admitting: Specialist

## 2016-12-06 ENCOUNTER — Inpatient Hospital Stay (HOSPITAL_COMMUNITY): Payer: BLUE CROSS/BLUE SHIELD | Admitting: Emergency Medicine

## 2016-12-06 DIAGNOSIS — E232 Diabetes insipidus: Secondary | ICD-10-CM | POA: Diagnosis not present

## 2016-12-06 DIAGNOSIS — I739 Peripheral vascular disease, unspecified: Secondary | ICD-10-CM | POA: Diagnosis present

## 2016-12-06 DIAGNOSIS — Z419 Encounter for procedure for purposes other than remedying health state, unspecified: Secondary | ICD-10-CM

## 2016-12-06 DIAGNOSIS — M5116 Intervertebral disc disorders with radiculopathy, lumbar region: Secondary | ICD-10-CM | POA: Diagnosis not present

## 2016-12-06 DIAGNOSIS — Y92234 Operating room of hospital as the place of occurrence of the external cause: Secondary | ICD-10-CM | POA: Diagnosis not present

## 2016-12-06 DIAGNOSIS — M5126 Other intervertebral disc displacement, lumbar region: Secondary | ICD-10-CM | POA: Diagnosis not present

## 2016-12-06 DIAGNOSIS — F329 Major depressive disorder, single episode, unspecified: Secondary | ICD-10-CM | POA: Diagnosis present

## 2016-12-06 DIAGNOSIS — E230 Hypopituitarism: Secondary | ICD-10-CM | POA: Diagnosis present

## 2016-12-06 DIAGNOSIS — Z8701 Personal history of pneumonia (recurrent): Secondary | ICD-10-CM

## 2016-12-06 DIAGNOSIS — E7800 Pure hypercholesterolemia, unspecified: Secondary | ICD-10-CM | POA: Diagnosis present

## 2016-12-06 DIAGNOSIS — M48061 Spinal stenosis, lumbar region without neurogenic claudication: Secondary | ICD-10-CM | POA: Diagnosis present

## 2016-12-06 DIAGNOSIS — Z880 Allergy status to penicillin: Secondary | ICD-10-CM | POA: Diagnosis not present

## 2016-12-06 DIAGNOSIS — Y838 Other surgical procedures as the cause of abnormal reaction of the patient, or of later complication, without mention of misadventure at the time of the procedure: Secondary | ICD-10-CM | POA: Diagnosis present

## 2016-12-06 DIAGNOSIS — Z91013 Allergy to seafood: Secondary | ICD-10-CM | POA: Diagnosis not present

## 2016-12-06 DIAGNOSIS — Z86018 Personal history of other benign neoplasm: Secondary | ICD-10-CM

## 2016-12-06 DIAGNOSIS — Z882 Allergy status to sulfonamides status: Secondary | ICD-10-CM

## 2016-12-06 DIAGNOSIS — Z79899 Other long term (current) drug therapy: Secondary | ICD-10-CM | POA: Diagnosis not present

## 2016-12-06 DIAGNOSIS — I9581 Postprocedural hypotension: Secondary | ICD-10-CM | POA: Diagnosis not present

## 2016-12-06 DIAGNOSIS — E2740 Unspecified adrenocortical insufficiency: Secondary | ICD-10-CM

## 2016-12-06 DIAGNOSIS — Z888 Allergy status to other drugs, medicaments and biological substances status: Secondary | ICD-10-CM

## 2016-12-06 DIAGNOSIS — G9741 Accidental puncture or laceration of dura during a procedure: Secondary | ICD-10-CM | POA: Diagnosis not present

## 2016-12-06 DIAGNOSIS — Z8249 Family history of ischemic heart disease and other diseases of the circulatory system: Secondary | ICD-10-CM

## 2016-12-06 DIAGNOSIS — Z791 Long term (current) use of non-steroidal anti-inflammatories (NSAID): Secondary | ICD-10-CM

## 2016-12-06 DIAGNOSIS — I959 Hypotension, unspecified: Secondary | ICD-10-CM | POA: Diagnosis not present

## 2016-12-06 DIAGNOSIS — Z981 Arthrodesis status: Secondary | ICD-10-CM | POA: Diagnosis not present

## 2016-12-06 DIAGNOSIS — K5900 Constipation, unspecified: Secondary | ICD-10-CM | POA: Diagnosis present

## 2016-12-06 DIAGNOSIS — E876 Hypokalemia: Secondary | ICD-10-CM | POA: Diagnosis not present

## 2016-12-06 DIAGNOSIS — M48062 Spinal stenosis, lumbar region with neurogenic claudication: Secondary | ICD-10-CM | POA: Diagnosis not present

## 2016-12-06 DIAGNOSIS — I10 Essential (primary) hypertension: Secondary | ICD-10-CM | POA: Diagnosis present

## 2016-12-06 DIAGNOSIS — Z8639 Personal history of other endocrine, nutritional and metabolic disease: Secondary | ICD-10-CM

## 2016-12-06 HISTORY — PX: LUMBAR FUSION: SHX111

## 2016-12-06 LAB — GLUCOSE, CAPILLARY: GLUCOSE-CAPILLARY: 144 mg/dL — AB (ref 65–99)

## 2016-12-06 SURGERY — POSTERIOR LUMBAR FUSION 1 LEVEL
Anesthesia: General | Site: Spine Lumbar

## 2016-12-06 MED ORDER — PHENYLEPHRINE HCL 10 MG/ML IJ SOLN
INTRAMUSCULAR | Status: DC | PRN
  Administered 2016-12-06 (×3): 40 ug via INTRAVENOUS
  Administered 2016-12-06: 120 ug via INTRAVENOUS
  Administered 2016-12-06: 40 ug via INTRAVENOUS

## 2016-12-06 MED ORDER — MEPERIDINE HCL 25 MG/ML IJ SOLN: 6.2500 mg | INTRAMUSCULAR | Status: DC | PRN

## 2016-12-06 MED ORDER — SURGIFOAM 100 EX MISC
CUTANEOUS | Status: DC | PRN
  Administered 2016-12-06: 1 via TOPICAL

## 2016-12-06 MED ORDER — VALACYCLOVIR HCL 500 MG PO TABS: 1000.0000 mg | ORAL_TABLET | Freq: Every day | ORAL | Status: DC | PRN

## 2016-12-06 MED ORDER — DEXAMETHASONE SODIUM PHOSPHATE 10 MG/ML IJ SOLN
INTRAMUSCULAR | Status: AC
  Filled 2016-12-06: qty 1

## 2016-12-06 MED ORDER — METHOCARBAMOL 1000 MG/10ML IJ SOLN
500.0000 mg | Freq: Four times a day (QID) | INTRAVENOUS | Status: DC | PRN
  Filled 2016-12-06: qty 5

## 2016-12-06 MED ORDER — 0.9 % SODIUM CHLORIDE (POUR BTL) OPTIME
TOPICAL | Status: DC | PRN
  Administered 2016-12-06 (×2): 1000 mL

## 2016-12-06 MED ORDER — GLYCOPYRROLATE 0.2 MG/ML IJ SOLN
INTRAMUSCULAR | Status: DC | PRN
  Administered 2016-12-06: 0.2 mg via INTRAVENOUS

## 2016-12-06 MED ORDER — MIDAZOLAM HCL 5 MG/5ML IJ SOLN
INTRAMUSCULAR | Status: DC | PRN
  Administered 2016-12-06: 2 mg via INTRAVENOUS

## 2016-12-06 MED ORDER — THROMBIN 20000 UNITS EX SOLR
CUTANEOUS | Status: DC | PRN
Start: 2016-12-06 — End: 2016-12-06
  Administered 2016-12-06: 20000 [IU] via TOPICAL

## 2016-12-06 MED ORDER — BACLOFEN 10 MG PO TABS: 10.0000 mg | ORAL_TABLET | Freq: Every day | ORAL | Status: DC

## 2016-12-06 MED ORDER — ACETAMINOPHEN 650 MG RE SUPP: 650.0000 mg | RECTAL | Status: DC | PRN

## 2016-12-06 MED ORDER — LIDOCAINE 2% (20 MG/ML) 5 ML SYRINGE
INTRAMUSCULAR | Status: AC
  Filled 2016-12-06: qty 5

## 2016-12-06 MED ORDER — SODIUM CHLORIDE 0.9% FLUSH: 3.0000 mL | INTRAVENOUS | Status: DC | PRN

## 2016-12-06 MED ORDER — HYDROCOD POLST-CPM POLST ER 10-8 MG/5ML PO SUER: 5.0000 mL | Freq: Every evening | ORAL | Status: DC | PRN

## 2016-12-06 MED ORDER — CLINDAMYCIN PHOSPHATE 900 MG/50ML IV SOLN
900.0000 mg | Freq: Three times a day (TID) | INTRAVENOUS | Status: AC
  Administered 2016-12-06 – 2016-12-07 (×3): 900 mg via INTRAVENOUS
  Filled 2016-12-06 (×3): qty 50

## 2016-12-06 MED ORDER — ARTIFICIAL TEARS OP OINT
TOPICAL_OINTMENT | OPHTHALMIC | Status: AC
  Filled 2016-12-06: qty 3.5

## 2016-12-06 MED ORDER — FENTANYL CITRATE (PF) 100 MCG/2ML IJ SOLN
INTRAMUSCULAR | Status: AC
  Filled 2016-12-06: qty 2

## 2016-12-06 MED ORDER — FENTANYL CITRATE (PF) 100 MCG/2ML IJ SOLN
INTRAMUSCULAR | Status: AC
  Filled 2016-12-06: qty 4

## 2016-12-06 MED ORDER — LIDOCAINE HCL (CARDIAC) 20 MG/ML IV SOLN
INTRAVENOUS | Status: DC | PRN
  Administered 2016-12-06: 100 mg via INTRAVENOUS

## 2016-12-06 MED ORDER — DOCUSATE SODIUM 100 MG PO CAPS
100.0000 mg | ORAL_CAPSULE | Freq: Two times a day (BID) | ORAL | Status: DC
  Administered 2016-12-06 – 2016-12-09 (×6): 100 mg via ORAL
  Filled 2016-12-06 (×6): qty 1

## 2016-12-06 MED ORDER — INSULIN ASPART 100 UNIT/ML ~~LOC~~ SOLN
0.0000 [IU] | Freq: Three times a day (TID) | SUBCUTANEOUS | Status: DC
  Administered 2016-12-07 (×3): 1 [IU] via SUBCUTANEOUS

## 2016-12-06 MED ORDER — OXYCODONE HCL ER 20 MG PO T12A
20.0000 mg | EXTENDED_RELEASE_TABLET | Freq: Two times a day (BID) | ORAL | Status: DC
  Administered 2016-12-06 – 2016-12-07 (×3): 20 mg via ORAL
  Filled 2016-12-06 (×3): qty 1

## 2016-12-06 MED ORDER — PROPOFOL 10 MG/ML IV BOLUS
INTRAVENOUS | Status: DC | PRN
  Administered 2016-12-06: 130 mg via INTRAVENOUS

## 2016-12-06 MED ORDER — VANCOMYCIN HCL IN DEXTROSE 1-5 GM/200ML-% IV SOLN
INTRAVENOUS | Status: AC
  Filled 2016-12-06: qty 200

## 2016-12-06 MED ORDER — PROPOFOL 10 MG/ML IV BOLUS
INTRAVENOUS | Status: AC
  Filled 2016-12-06: qty 20

## 2016-12-06 MED ORDER — THROMBIN 20000 UNITS EX KIT
PACK | CUTANEOUS | Status: AC
  Filled 2016-12-06: qty 1

## 2016-12-06 MED ORDER — PHENYLEPHRINE 40 MCG/ML (10ML) SYRINGE FOR IV PUSH (FOR BLOOD PRESSURE SUPPORT)
PREFILLED_SYRINGE | INTRAVENOUS | Status: AC
  Filled 2016-12-06: qty 10

## 2016-12-06 MED ORDER — ALUM & MAG HYDROXIDE-SIMETH 200-200-20 MG/5ML PO SUSP: 30.0000 mL | Freq: Four times a day (QID) | ORAL | Status: DC | PRN

## 2016-12-06 MED ORDER — GABAPENTIN 300 MG PO CAPS
300.0000 mg | ORAL_CAPSULE | Freq: Three times a day (TID) | ORAL | Status: DC
  Administered 2016-12-06 – 2016-12-10 (×12): 300 mg via ORAL
  Filled 2016-12-06 (×12): qty 1

## 2016-12-06 MED ORDER — MORPHINE SULFATE (PF) 2 MG/ML IV SOLN
2.0000 mg | INTRAVENOUS | Status: DC | PRN
  Administered 2016-12-06 – 2016-12-10 (×18): 2 mg via INTRAVENOUS
  Filled 2016-12-06 (×19): qty 1

## 2016-12-06 MED ORDER — ROSUVASTATIN CALCIUM 10 MG PO TABS
20.0000 mg | ORAL_TABLET | Freq: Every day | ORAL | Status: DC
  Administered 2016-12-06 – 2016-12-10 (×5): 20 mg via ORAL
  Filled 2016-12-06 (×5): qty 2

## 2016-12-06 MED ORDER — HYDROCORTISONE NA SUCCINATE PF 100 MG IJ SOLR
100.0000 mg | Freq: Three times a day (TID) | INTRAMUSCULAR | Status: AC
  Administered 2016-12-06 – 2016-12-07 (×2): 100 mg via INTRAVENOUS
  Filled 2016-12-06 (×3): qty 2

## 2016-12-06 MED ORDER — ONDANSETRON HCL 4 MG/2ML IJ SOLN
INTRAMUSCULAR | Status: DC | PRN
  Administered 2016-12-06 (×2): 4 mg via INTRAVENOUS

## 2016-12-06 MED ORDER — ROCURONIUM BROMIDE 100 MG/10ML IV SOLN
INTRAVENOUS | Status: DC | PRN
  Administered 2016-12-06: 50 mg via INTRAVENOUS

## 2016-12-06 MED ORDER — SODIUM CHLORIDE 0.9 % IV SOLN: 250.0000 mL | INTRAVENOUS | Status: DC

## 2016-12-06 MED ORDER — PHENOL 1.4 % MT LIQD: 1.0000 | OROMUCOSAL | Status: DC | PRN

## 2016-12-06 MED ORDER — CHLORHEXIDINE GLUCONATE 4 % EX LIQD: 60.0000 mL | Freq: Once | CUTANEOUS | Status: DC

## 2016-12-06 MED ORDER — HYDROMORPHONE HCL 1 MG/ML IJ SOLN
INTRAMUSCULAR | Status: AC
  Filled 2016-12-06: qty 0.5

## 2016-12-06 MED ORDER — POTASSIUM CHLORIDE IN NACL 20-0.9 MEQ/L-% IV SOLN
INTRAVENOUS | Status: DC
  Administered 2016-12-07: 03:00:00 via INTRAVENOUS
  Filled 2016-12-06: qty 1000

## 2016-12-06 MED ORDER — AMPHETAMINE-DEXTROAMPHET ER 10 MG PO CP24: 20.0000 mg | ORAL_CAPSULE | Freq: Every day | ORAL | Status: DC | PRN

## 2016-12-06 MED ORDER — HEMOSTATIC AGENTS (NO CHARGE) OPTIME
TOPICAL | Status: DC | PRN
  Administered 2016-12-06: 1 via TOPICAL

## 2016-12-06 MED ORDER — THROMBIN 20000 UNITS EX SOLR
CUTANEOUS | Status: AC
  Filled 2016-12-06: qty 20000

## 2016-12-06 MED ORDER — HYDROMORPHONE HCL 1 MG/ML IJ SOLN
0.2500 mg | INTRAMUSCULAR | Status: DC | PRN
  Administered 2016-12-06 (×3): 0.5 mg via INTRAVENOUS

## 2016-12-06 MED ORDER — PROBIOTIC PO CAPS: 1.0000 | ORAL_CAPSULE | Freq: Every day | ORAL | Status: DC

## 2016-12-06 MED ORDER — POLYETHYLENE GLYCOL 3350 17 G PO PACK: 17.0000 g | PACK | Freq: Every day | ORAL | Status: DC | PRN

## 2016-12-06 MED ORDER — BUPIVACAINE HCL (PF) 0.5 % IJ SOLN
INTRAMUSCULAR | Status: AC
  Filled 2016-12-06: qty 30

## 2016-12-06 MED ORDER — BISACODYL 5 MG PO TBEC
5.0000 mg | DELAYED_RELEASE_TABLET | Freq: Every day | ORAL | Status: DC | PRN
  Administered 2016-12-08 – 2016-12-09 (×2): 5 mg via ORAL
  Filled 2016-12-06 (×2): qty 1

## 2016-12-06 MED ORDER — TOPIRAMATE 25 MG PO TABS
150.0000 mg | ORAL_TABLET | Freq: Every day | ORAL | Status: DC
  Administered 2016-12-07 – 2016-12-10 (×4): 150 mg via ORAL
  Filled 2016-12-06 (×4): qty 2

## 2016-12-06 MED ORDER — SUGAMMADEX SODIUM 200 MG/2ML IV SOLN
INTRAVENOUS | Status: DC | PRN
  Administered 2016-12-06: 200 mg via INTRAVENOUS

## 2016-12-06 MED ORDER — OXYCODONE HCL 5 MG PO TABS
ORAL_TABLET | ORAL | Status: AC
  Filled 2016-12-06: qty 1

## 2016-12-06 MED ORDER — ACETAMINOPHEN 325 MG PO TABS
650.0000 mg | ORAL_TABLET | ORAL | Status: DC | PRN
  Administered 2016-12-08: 650 mg via ORAL
  Filled 2016-12-06: qty 2

## 2016-12-06 MED ORDER — MIDAZOLAM HCL 2 MG/2ML IJ SOLN
INTRAMUSCULAR | Status: AC
  Filled 2016-12-06: qty 2

## 2016-12-06 MED ORDER — DEXAMETHASONE SODIUM PHOSPHATE 4 MG/ML IJ SOLN
INTRAMUSCULAR | Status: DC | PRN
Start: 2016-12-06 — End: 2016-12-06
  Administered 2016-12-06: 10 mg via INTRAVENOUS

## 2016-12-06 MED ORDER — ONDANSETRON HCL 4 MG/2ML IJ SOLN
4.0000 mg | Freq: Four times a day (QID) | INTRAMUSCULAR | Status: DC | PRN
  Administered 2016-12-07 – 2016-12-09 (×4): 4 mg via INTRAVENOUS
  Filled 2016-12-06 (×5): qty 2

## 2016-12-06 MED ORDER — DESMOPRESSIN ACETATE 0.1 MG PO TABS
0.1000 mg | ORAL_TABLET | Freq: Two times a day (BID) | ORAL | Status: DC
  Administered 2016-12-06 – 2016-12-10 (×8): 0.1 mg via ORAL
  Filled 2016-12-06 (×8): qty 1

## 2016-12-06 MED ORDER — FLEET ENEMA 7-19 GM/118ML RE ENEM: 1.0000 | ENEMA | Freq: Once | RECTAL | Status: DC | PRN

## 2016-12-06 MED ORDER — FENTANYL CITRATE (PF) 100 MCG/2ML IJ SOLN
INTRAMUSCULAR | Status: DC | PRN
  Administered 2016-12-06 (×8): 50 ug via INTRAVENOUS
  Administered 2016-12-06 (×3): 100 ug via INTRAVENOUS

## 2016-12-06 MED ORDER — ONDANSETRON HCL 4 MG/2ML IJ SOLN: 4.0000 mg | Freq: Once | INTRAMUSCULAR | Status: DC | PRN

## 2016-12-06 MED ORDER — METHOCARBAMOL 500 MG PO TABS
ORAL_TABLET | ORAL | Status: AC
  Filled 2016-12-06: qty 1

## 2016-12-06 MED ORDER — MENTHOL 3 MG MT LOZG: 1.0000 | LOZENGE | OROMUCOSAL | Status: DC | PRN

## 2016-12-06 MED ORDER — ONDANSETRON HCL 4 MG/2ML IJ SOLN
INTRAMUSCULAR | Status: AC
  Filled 2016-12-06: qty 2

## 2016-12-06 MED ORDER — SODIUM CHLORIDE 0.9% FLUSH
3.0000 mL | Freq: Two times a day (BID) | INTRAVENOUS | Status: DC
  Administered 2016-12-06 – 2016-12-10 (×8): 3 mL via INTRAVENOUS

## 2016-12-06 MED ORDER — BUPIVACAINE LIPOSOME 1.3 % IJ SUSP
INTRAMUSCULAR | Status: DC | PRN
  Administered 2016-12-06: 20 mL

## 2016-12-06 MED ORDER — ROCURONIUM BROMIDE 50 MG/5ML IV SOSY
PREFILLED_SYRINGE | INTRAVENOUS | Status: AC
  Filled 2016-12-06: qty 5

## 2016-12-06 MED ORDER — PANTOPRAZOLE SODIUM 40 MG IV SOLR
40.0000 mg | Freq: Every day | INTRAVENOUS | Status: DC
  Administered 2016-12-06: 40 mg via INTRAVENOUS
  Filled 2016-12-06: qty 40

## 2016-12-06 MED ORDER — LACTATED RINGERS IV SOLN
INTRAVENOUS | Status: DC | PRN
  Administered 2016-12-06 (×3): via INTRAVENOUS

## 2016-12-06 MED ORDER — SUGAMMADEX SODIUM 200 MG/2ML IV SOLN
INTRAVENOUS | Status: AC
  Filled 2016-12-06: qty 2

## 2016-12-06 MED ORDER — HYDROMORPHONE HCL 1 MG/ML IJ SOLN
INTRAMUSCULAR | Status: AC
  Filled 2016-12-06: qty 1

## 2016-12-06 MED ORDER — ESTRADIOL 0.0375 MG/24HR TD PTWK
0.0375 mg | MEDICATED_PATCH | TRANSDERMAL | Status: DC
  Administered 2016-12-10: 0.0375 mg via TRANSDERMAL
  Filled 2016-12-06: qty 1

## 2016-12-06 MED ORDER — ONDANSETRON HCL 4 MG PO TABS
4.0000 mg | ORAL_TABLET | Freq: Four times a day (QID) | ORAL | Status: DC | PRN
Start: 2016-12-06 — End: 2016-12-10

## 2016-12-06 MED ORDER — BUPIVACAINE HCL 0.5 % IJ SOLN
INTRAMUSCULAR | Status: DC | PRN
  Administered 2016-12-06: 20 mL

## 2016-12-06 MED ORDER — NAPROXEN SODIUM 275 MG PO TABS
275.0000 mg | ORAL_TABLET | Freq: Two times a day (BID) | ORAL | Status: DC
  Administered 2016-12-06 – 2016-12-07 (×3): 275 mg via ORAL
  Filled 2016-12-06 (×3): qty 1

## 2016-12-06 MED ORDER — METHOCARBAMOL 500 MG PO TABS
500.0000 mg | ORAL_TABLET | Freq: Four times a day (QID) | ORAL | Status: DC | PRN
  Administered 2016-12-06 – 2016-12-10 (×9): 500 mg via ORAL
  Filled 2016-12-06 (×8): qty 1

## 2016-12-06 MED ORDER — MORPHINE SULFATE (PF) 2 MG/ML IV SOLN
1.0000 mg | INTRAVENOUS | Status: DC | PRN
  Administered 2016-12-06 (×2): 1 mg via INTRAVENOUS
  Filled 2016-12-06 (×2): qty 1

## 2016-12-06 MED ORDER — CLINDAMYCIN PHOSPHATE 900 MG/50ML IV SOLN
INTRAVENOUS | Status: AC
  Filled 2016-12-06: qty 50

## 2016-12-06 MED ORDER — OXYCODONE HCL 5 MG PO TABS
5.0000 mg | ORAL_TABLET | ORAL | Status: DC | PRN
  Administered 2016-12-06: 10 mg via ORAL
  Administered 2016-12-06: 5 mg via ORAL
  Administered 2016-12-08 – 2016-12-10 (×11): 10 mg via ORAL
  Filled 2016-12-06 (×14): qty 2

## 2016-12-06 SURGICAL SUPPLY — 84 items
BENZOIN TINCTURE PRP APPL 2/3 (GAUZE/BANDAGES/DRESSINGS) ×2 IMPLANT
BLADE CLIPPER SURG (BLADE) IMPLANT
BLADE SURG 15 STRL LF DISP TIS (BLADE) ×1 IMPLANT
BLADE SURG 15 STRL SS (BLADE) ×1
BONE CANC CHIPS 20CC PCAN1/4 (Bone Implant) ×2 IMPLANT
BONE VIVIGEN FORMABLE 5.4CC (Bone Implant) ×2 IMPLANT
BUR MATCHSTICK NEURO 3.0 LAGG (BURR) ×2 IMPLANT
BUR RND FLUTED 2.5 (BURR) IMPLANT
BUR SABER RD CUTTING 3.0 (BURR) ×2 IMPLANT
CAGE BULLET CONCORDE 9X10X27 (Cage) ×2 IMPLANT
CHIPS CANC BONE 20CC PCAN1/4 (Bone Implant) ×1 IMPLANT
COVER BACK TABLE 80X110 HD (DRAPES) ×2 IMPLANT
COVER MAYO STAND STRL (DRAPES) ×2 IMPLANT
COVER SURGICAL LIGHT HANDLE (MISCELLANEOUS) ×2 IMPLANT
DECANTER SPIKE VIAL GLASS SM (MISCELLANEOUS) ×2 IMPLANT
DERMABOND ADVANCED (GAUZE/BANDAGES/DRESSINGS) ×1
DERMABOND ADVANCED .7 DNX12 (GAUZE/BANDAGES/DRESSINGS) ×1 IMPLANT
DRAPE C-ARM 42X72 X-RAY (DRAPES) ×2 IMPLANT
DRAPE C-ARMOR (DRAPES) ×2 IMPLANT
DRAPE INCISE IOBAN 66X45 STRL (DRAPES) ×2 IMPLANT
DRAPE MICROSCOPE LEICA (MISCELLANEOUS) ×2 IMPLANT
DRAPE POUCH INSTRU U-SHP 10X18 (DRAPES) ×2 IMPLANT
DRAPE SURG 17X23 STRL (DRAPES) ×8 IMPLANT
DRSG MEPILEX BORDER 4X4 (GAUZE/BANDAGES/DRESSINGS) IMPLANT
DRSG MEPILEX BORDER 4X8 (GAUZE/BANDAGES/DRESSINGS) ×2 IMPLANT
DURAPREP 26ML APPLICATOR (WOUND CARE) ×2 IMPLANT
DURASEAL APPLICATOR TIP (TIP) ×2 IMPLANT
DURASEAL SPINE SEALANT 3ML (MISCELLANEOUS) ×2 IMPLANT
ELECT BLADE 6.5 EXT (BLADE) ×2 IMPLANT
ELECT CAUTERY BLADE 6.4 (BLADE) ×2 IMPLANT
ELECT REM PT RETURN 9FT ADLT (ELECTROSURGICAL) ×2
ELECTRODE REM PT RTRN 9FT ADLT (ELECTROSURGICAL) ×1 IMPLANT
EVACUATOR 1/8 PVC DRAIN (DRAIN) IMPLANT
GAUZE SPONGE 4X4 12PLY STRL (GAUZE/BANDAGES/DRESSINGS) ×2 IMPLANT
GLOVE BIOGEL PI IND STRL 7.0 (GLOVE) ×2 IMPLANT
GLOVE BIOGEL PI IND STRL 8 (GLOVE) ×2 IMPLANT
GLOVE BIOGEL PI INDICATOR 7.0 (GLOVE) ×2
GLOVE BIOGEL PI INDICATOR 8 (GLOVE) ×2
GLOVE ECLIPSE 9.0 STRL (GLOVE) ×4 IMPLANT
GLOVE ORTHO TXT STRL SZ7.5 (GLOVE) ×4 IMPLANT
GLOVE SURG 8.5 LATEX PF (GLOVE) ×6 IMPLANT
GOWN STRL REUS W/ TWL LRG LVL3 (GOWN DISPOSABLE) ×2 IMPLANT
GOWN STRL REUS W/TWL 2XL LVL3 (GOWN DISPOSABLE) ×8 IMPLANT
GOWN STRL REUS W/TWL LRG LVL3 (GOWN DISPOSABLE) ×2
KIT BASIN OR (CUSTOM PROCEDURE TRAY) ×2 IMPLANT
KIT POSITION SURG JACKSON T1 (MISCELLANEOUS) ×2 IMPLANT
KIT ROOM TURNOVER OR (KITS) ×2 IMPLANT
NEEDLE 22X1 1/2 (OR ONLY) (NEEDLE) ×2 IMPLANT
NEEDLE ASP BONE MRW 11GX15 (NEEDLE) IMPLANT
NEEDLE BONE MARROW 8GAX6 (NEEDLE) IMPLANT
NEEDLE SPNL 18GX3.5 QUINCKE PK (NEEDLE) ×2 IMPLANT
NS IRRIG 1000ML POUR BTL (IV SOLUTION) ×6 IMPLANT
PACK LAMINECTOMY ORTHO (CUSTOM PROCEDURE TRAY) ×2 IMPLANT
PAD ARMBOARD 7.5X6 YLW CONV (MISCELLANEOUS) ×4 IMPLANT
PATTIES SURGICAL .75X.75 (GAUZE/BANDAGES/DRESSINGS) ×2 IMPLANT
PATTIES SURGICAL 1X1 (DISPOSABLE) IMPLANT
ROD PRE BENT EXP 40MM (Rod) ×2 IMPLANT
ROD PRE LORDOSED 5.5X45 (Rod) ×4 IMPLANT
SCREW SET SINGLE INNER (Screw) ×8 IMPLANT
SCREW VIPER 7X45MM (Screw) ×6 IMPLANT
SCREW VIPER 7X50MM (Screw) ×2 IMPLANT
SPONGE LAP 18X18 X RAY DECT (DISPOSABLE) ×2 IMPLANT
SPONGE LAP 4X18 X RAY DECT (DISPOSABLE) ×2 IMPLANT
SPONGE SURGIFOAM ABS GEL 100 (HEMOSTASIS) ×2 IMPLANT
STRIP CLOSURE SKIN 1/2X4 (GAUZE/BANDAGES/DRESSINGS) ×2 IMPLANT
SURGIFLO W/THROMBIN 8M KIT (HEMOSTASIS) ×2 IMPLANT
SUT NURALON 4 0 TR CR/8 (SUTURE) ×2 IMPLANT
SUT VIC AB 0 CT1 27 (SUTURE) ×2
SUT VIC AB 0 CT1 27XBRD ANBCTR (SUTURE) ×2 IMPLANT
SUT VIC AB 1 CTX 36 (SUTURE) ×1
SUT VIC AB 1 CTX36XBRD ANBCTR (SUTURE) ×1 IMPLANT
SUT VIC AB 2-0 CT1 27 (SUTURE) ×1
SUT VIC AB 2-0 CT1 TAPERPNT 27 (SUTURE) ×1 IMPLANT
SUT VIC AB 3-0 X1 27 (SUTURE) ×2 IMPLANT
SUT VICRYL 0 CT 1 36IN (SUTURE) IMPLANT
SYR 20CC LL (SYRINGE) ×2 IMPLANT
SYR CONTROL 10ML LL (SYRINGE) ×4 IMPLANT
TAP CANN VIPER2 DL 6.0 (TAP) ×2 IMPLANT
TAP CANN VIPER2 DL 7.0 (TAP) ×2 IMPLANT
TOWEL OR 17X24 6PK STRL BLUE (TOWEL DISPOSABLE) ×2 IMPLANT
TOWEL OR 17X26 10 PK STRL BLUE (TOWEL DISPOSABLE) ×2 IMPLANT
TRAY FOLEY W/METER SILVER 16FR (SET/KITS/TRAYS/PACK) ×2 IMPLANT
WATER STERILE IRR 1000ML POUR (IV SOLUTION) ×2 IMPLANT
YANKAUER SUCT BULB TIP NO VENT (SUCTIONS) IMPLANT

## 2016-12-06 NOTE — Op Note (Signed)
12/06/2016  2:57 PM  PATIENT:  [name] [name]  51 y.o. 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 Conrad [place].  COMPLICATIONS:  Right S9-7 2 mm dural tear, repaired with 2 x 4-0 neurolon sutures and duraseal, No neural element injury.   EBL: 400CC  CELL SAVER: 150CC    COMPONENTS:  Implant Name Type Inv. Item Serial No. Manufacturer Lot No. LRB No. Used  BONE VIVIGEN FORMABLE 5.4CC - [id number] Bone Implant BONE VIVIGEN FORMABLE 5.4CC [id number] [employer] [facility]  N/A 1  BONE CANC CHIPS 20CC - [id number] Bone Implant BONE CANC CHIPS 20CC [id number] [employer]  N/A 1  SCREW VIPER 7X45MM - [id number] Screw SCREW VIPER 7X45MM  [employer]  N/A 3  ROD PRE LORDOSED 5.5X45 - [id number] Rod ROD PRE LORDOSED 5.5X45  [employer]  N/A 1  ROD PRE LORDOSED 5.5X45 - [id number] Rod ROD PRE LORDOSED 5.5X45  [employer]  N/A 1  SCREW SET SINGLE INNER - [id number] Screw SCREW SET SINGLE INNER  [employer]  N/A 4  CAGE BULLET CONCORDE 9X10X27MM - [id number] Cage CAGE BULLET CONCORDE 9X10X27MM  [employer]  N/A 1  ROD PRE BENT EXP 40MM - [id number] Rod ROD PRE BENT EXP 40MM  [employer]  N/A 1  SCREW VIPER 7X50MM - [id number] Screw SCREW VIPER 7X50MM   [employer]   N/A 1     PROCEDURE: The patient was met in the holding area, and the appropriate lumbar levels right  L3-4, L4-5 and left L5-S1 identified and marked with an "X" and my initials. I had discussion with the patient in the preop holding area regarding a change of consent form.The fusion level was reidentified as  L4-5. Patient understands the rationale to perform TLIF at L4-5 level to decompress the bilateral L3-4 lateral recess and foramenal stenosis and left L5-S1. The patient was then transported to OR and was placed under general anestheticwithout difficulty. The patient received appropriate preoperative antibiotic prophylaxis clindamycin 964m IV for multiple antibiotic allergies.  Nursing staff inserted a Foley catheter under sterile conditions. The patient was then turned to a prone position using the JLebecspine frame. PAS. all pressure points well padded the arms at the side to 90 90. Standard prep with DuraPrep solution draped in the usual manner from the lower dorsal spine the mid sacral segment. Iodine Vi-Drape was used and the old incision scar was marked. Time-out procedure was called and correct. Skin in the midline between L2 and L5 was then infiltrated with local anesthesia, marcaine 1/2% 1:1 exparel 1.3% total 20 cc used. Incision was then made  extending from L3-L5  through the skin and subcutaneous layers down to the patient's lumbodorsal fascia and spinous processes. The incision then carried sharply inxcising the supraspinous ligament bilaterally and then continuing the  lateral aspect of the spinous processes of L2, L3 and L4. Cobb elevator used to carefully elevate the paralumbar muscles off of the posterior elements using electrocautery carefully drilled bleeding and perform dissection of the muscle tissues of the preserving the facet capsule at the L2-3 and L3-4 and L5-S1.  Continuing the exposure out laterally to expose the lateral margin of the facet joint  line at L3-4 and L4-5. Incision was carried in the midline down to the L5 level area bleeders controlled using electrocautery monopolar electrocautery.   C-arm fluoroscopy was then brought into the field and using C-arm fluoroscopy then a hole made into the medial aspect of the pedicle of left L4 using a high speed burr observed in the pedicle using C arm at the 5 oclock position on the left L4 pedicle nerve probe initial entry was determined on fluoroscopy to be good position alignment so that a 4.69m tap was passed to 30 mm within the left L4 pedicle to a depth of nearly 50 mm observed on C-arm fluoroscopy to be beyond the midpoint of the lumbar vertebra and then position alignment within the left L4 pedicle this was then removed and the pedicle channel probed demonstrating patency no sign of rupture the cortex of the pedicle. Tapping with a 4 mm screw tap then 5 mm tap then a 6 mm tap and then a 7.0 mm tap, a 7.0 mm x 45 mm screw was placed in the left side pedicle at the L4 level. C-arm fluoroscopy was then brought into the field and using C-arm fluoroscopy then a hole made into the posterior medial aspect of the pedicle of right L4 observed in the pedicle using ball tipped nerve hook and hockey stick nerve probe initial entry was determined on fluoroscopy to be good position alignment so that 4.031mtap was then used to tap the right L4 pedicle to a depth of nearly 50 mm observed on C-arm fluoroscopy to be beyond the midpoint of the lumbar vertebra and then position alignment within the right L4 pedicle this was then removed and the pedicle channel probed demonstrating patency no sign of rupture the cortex of the pedicle. Tapping with a 5 mm screw tap then tapping with a 6.0 mm tap and then a 7.0 mm tap, a 7.0 mm x 50 mm screw was placed in the right L4 pedicle. C-arm fluoroscopy was then brought into the field and using C-arm fluoroscopy then a hole made into the posterior and medial aspect of the left  pedicle of L5 observed in the pedicle using ball tipped nerve hook and hockey stick nerve probe initial entry was determined on fluoroscopy to be good position alignment so that a 4.0 mm tap was then used to tap the left L4 pedicle to a depth of nearly 50 mm observed on C-arm fluoroscopy to be beyond the posterior one third of the lumbar vertebra and good position alignment within the left L5 pedicle this was then removed and the pedicle channel probed demonstrating patency no sign of rupture the cortex of the pedicle. Tapping with a 4.0 mm screw tap then a 5.0 mm tap then tapping with a 6.0 mm tap and then a 7.0 mm tap, a 7.3m61m 45 mm screw was placeed on the left side at the L5 level. The pedicle channel of L5 on the left probed demonstrating patency no sign of rupture the cortex of the pedicle.C-arm fluoroscopy was then brought into the field and using C-arm fluoroscopy then a hole made  into the posterior and medial aspect of the right pedicle of L5 observed in the pedicle using ball tipped nerve hook and hockey stick nerve probe initial entry was determined on fluoroscopy to be good position alignment so that a 4.74m tap was then used to tap the right L4 pedicle to a depth of nearly 50 mm observed on C-arm fluoroscopy to be beyond the posterior one third of the lumbar vertebra and good position alignment within the right L5 pedicle this was then removed and the pedicle channel probed demonstrating patency no sign of rupture the cortex of the pedicle. Tapping with a 5 mm screw tap, then a 6.061mtap and then a 7.0 mm tap,  then 7.55m355m 45 mm screw was placed on the table for later placement on the right side at the L5 level following TLIF and laminotomy portions of the case. The pedicle channel of L5 on the right probed demonstrating patency no sign of rupture the cortex of the pedicle. Viper screw for fixation of this level was measured as 7.0 mm x 45 mm screw reserved for insertion after decompression and TLIF.   Flow Seal was used for hemostasis of the right L5 pedicle screw holes and prior to placement of the pedicle screws at L4 and L5.  Insight retractor placed down to and docking on the posterior aspect of the lamina at the expected L5-S1 level. This was sterilely attached to the articulating arm and it's up right which had been attached the OR table sterilely. The operating room microscope sterilely draped brought into the field. Under the operating room microscope, the left L5-S1 interspace carefully debrided the small amount of muscle attachment here and high-speed bur used to drill the medial aspect of the inferior articular process of L5 approximately 10%. 2 mm Kerrison then used to enter the spinal canal over the superior aspect of the S1 lamina carefully using the Kerrison to debris the attachment as a curet. Foraminotomy was then performed over the S1 nerve root. The medial 10% superior articular process of S1 and then resected using an osteotome and 2 mm Kerrison. This allowed for identification of the thecal sac. Penfield 4 was then used to carefully mobilize the thecal sac medially and the S1 nerve root identified within the lateral recess flattened over the posterior aspect of the bulging disc. Carefully the lateral aspect of the S1 nerve root was identified and a Penfield 4 was used to mobilize the nerve medially such that the bulging disc was visible with microscope. Using a Penfield 4 for retraction of the S1 and the nerve root  able to be mobilized medially and retracted using a Derricho retractor. Further foraminotomies was performed over the L5 nerve root the nerve root was noted to be decompressed.  Soft tissues debrided about the posterior aspect of the right side L3-4 interspace. High-speed bur and then used to carefully drill inferior 3 or 4 mm of the right side L3 lamina and on the medial aspect of the right L3 inferior articular process of 3 mm. Scar was freed up along the right medial L3-4  facet and the superior lamina of L4 on the right side. The superior margin of the L4 lamina then carefully debrided with curette and a 2 mm kerrison then used to enter the spinal canal over the superior aspect of the L4 lamina resecting bone over the superior aspect and freeing up the attachment of ligamentum flavum here. Ligamentum flavum then debrided with the 2 mm and 3  mm Kerrisons we decompressed the L4 nerve root and the lateral recess right L3-4 decompressed using 2 and 3 mm Kerrisons sizing hypertrophic reflected ligamentum flavum extending superiorly. Ligamentum flavum was resected off the ventral aspect of the inferior margin of the L3 lamina. Hockey-stick nerve probe could then be passed out the L3 neuroforamen and the L4 neuroforamen. Venous bleeding encountered. Thrombin-soaked Gelfoam used to control this following this then the sac and the L4 nerve root were mobilized medially and the L3-4 disc examined and found not to be herniated. Irrigation was carried out down to this bleeding controlled with Gelfoam. The central portions of the inferior base of the spinous process and left inferior lamina of L3 drilled leaving the ligamentum flavum intact. The ligamentum flavum then resected with kerrisons then resecting the medial superior articular process on the left side and decompressing the left lateral recess and the L3 and L4 neuroforamen. The  Gelfoam was then removed. Irrigation carried careful examination demonstrated no active bleeding present. A small 2 mm linear tear in the dura over the right thecal sac at the L4-5 disc level. The 2 mm dural tear was repaired with 2 x 4.0 neurolon sutures, valsalva to 30 mmHg showed no active leak. Duraseal was then applied over the dura repair site. Small amount of bleeding within the soft tissue mass the laminotomy area was controlled using bipolar electrocautery. The inferior 60% of right L4 lamina was then resected.  Leksell rongeur used to resect inferior  aspect of the lamina on the right side at the L5 level.The right facet of L4-5 were resected in order to decompress the right side of the lumbar thecal sac at L4-5 and decompress the bilateral  L4 and L5 neuroforamen. Osteotomes and 74m and 318mkerrisons were used for this portion of the decompression, the right side decompression was carried out but  Near complete facetectomy was perform on the right at L4-5 to provide for exposure of the right side L4-5 neuroforamen for ease of placement of TLIF (transforaminal lumbar interbody fusion) at the L4 level inferior portions of the lamina and pars were also resected first beginning with the Leksell rongeur and osteotomes and then resecting using 2 and 3 mm Kerrison. Continued laminectomy was carried out resecting the central portions of the lamina of L4 and upper L5 performing foraminotomies on the right side at the L4 and L5 levels. The inferior articular process  L4 was resected on the right side.  A large amount of hypertrophic ligmentum flavum and scar with disc material was found impressing on the right lateral recesses at L4-5 and narrowing the respective L4 and L5 neuroforamen.  OR microscope used during this portion procedure.  Attention then turned to placement of the transforaminal lumbar interbody fusion cages.  Bleeding controlled using bipolar electrocautery thrombin soaked gel cottonoids. Then turned to the right L4-5 level the exposure the posterior lateral aspect this was carried out using a Penfield 4 bipolar electrocautery to control small bleeders present. Derricho retractor used to retract the thecal sac and L4 nerve root a 15 blade scalpel was used to incise posterior lateral aspect of the right L4-5 disc the disc space at this level showed a rather severe narrowing posteriorly was more open anteriorly so that an osteotome again was used to resect a small portion the posterior inferior lip of the vertebral body at L4  in order to gain ease of access  into the L4-5 disc space. The space was debrided of degenerative disc material using  pituitary along root the entire disc space was then debrided of degenerative disc material using pituitary rongeurs curettage down to bleeding bone endplates. 26m and 8 mm shavers were used to debride the disc space and pituitary ronguers used to remove the loosened debris. This space was then carefully assess using spacers  a 183mtrial cage provided the best fit, the Depuy concorde bullet lordotic cage 1070m 3m32m chosen so that the permanent 10.0 mm cage by 27 mm Lordotic concorde cage packed with local bone graft was placed into the intervertebral disc space. The posterior intervertebral disc space was then packed with autogenous local bone graft that been harvested from the central laminectomy and conform DBX demineralized bone graft, allograft. Bleeding controlled using bipolar electrocautery.  Observed on C-arm fluoroscopy to be in good position alignment. The cage at L4-5 was placed anteriorly as best as possible the correct patient's lordosis. With this then the transforaminal lumbar interbody fusion portion of the case was completed bleeders were controlled using bipolar electrocautery thrombin-soaked Gelfoam were appropriate.Decortication of the facet joints carried out bilateral L4-5. These were packed with cancellous local bone graft.  The corticofixation screw on the right at L5 was placed and then each fastener carefully aligned  to allow for placement of rods. The right side first quarter inch titanium rod was then carefully contoured using the french benders and a precontoured 45 mm rod. This was then placed into the pedicle screws on the right extending from L4-L5 each of the caps were carefully placed loosely tightened. Attention turned to the left side were similarly and then screws were carefully adjusted to allow for a better pattern screws to allow for placement of fixation of the rod a quarter inch 40 mm  precontoured titanium rod was then carefully contoured. This was able to be inserted into the left pedicle screw fasteners, Caps onto the L4 fasteners were tightened to 80 foot lbs. Across the right side  L4-5 screw fasteners compression was obtained on the right side between L4 and L4compressing between the fasteners and tightening the screw caps 85 pounds. Similarly this was done on the left side at L4-5 obtaining compression and tightened 85 pounds. Irrigation was carried out with copious amounts of saline solution this was done throughout the case. Cell Saver was used during the case. Hockey stick neuroprobe was used to probe the neuroforamen bilateral L4 and L5 these were determined to be well decompressed. Permanent C-arm images were obtained in AP and lateral plane and oblique planes. Remaining local bone graft was then applied along both lateral posterior lateral region extending from L4 to L5 facet beds.Gelfoam was then removed spinal canal. The lumbodorsal musculature carefully exam debrided of any devitalized tissue following removal of self retaining retractors were the bleeders were controlled using electrocautery and the area dorsal lumbar muscle were then approximated in the midline with interrupted #1 Vicryl sutures loose the dorsal fascia was reattached to the spinous process of L2-3  superiorly and L5  inferiorly this was done with #1 Vicryl sutures. Subcutaneous layers then approximated using interrupted 0 Vicryl sutures and 2-0 Vicryl sutures. Skin was closed with a running subcutaneous stitch of 4-0 Vicryl Dermabond was applied then MedPlex bandage. All instrument and sponge counts were correct. The patient was then returned to a supine position on her bed reactivated extubated and returned to the recovery room in satisfactory condition.   JameBenjiman Core-C perform the duties of assistant surgeon during this case. He was present from the beginning  of the case to the end of the case assisting  in transfer the patient from his stretcher to the OR table and back to the stretcher at the end of the case. Assisted in careful retraction and suction of the laminectomy site delicate neural structures operating under the operating room microscope. He performed closure of the incision from the fascia to the skin applying the dressing.         [name]  12/06/2016, 2:57 PM

## 2016-12-06 NOTE — Discharge Instructions (Signed)
° ° °  Call if there is increasing drainage, fever greater than 101.5, severe head aches, and °worsening nausea or light sensitivity. °If shortness of breath, bloody cough or chest tightness or pain go to an emergency room. °No lifting greater than 10 lbs. °Avoid bending, stooping and twisting. °Use brace when sitting and out of bed even to go to bathroom. °Walk in house for first 2 weeks then may start to get out slowly increasing distances up to one quarter mile by 4-6 weeks post op. °After 5 days may shower and change dressing following bathing with shower.When °bathing remove the brace shower and replace brace before getting out of the shower. °If drainage, keep dry dressing and do not bathe the incision, use an moisture impervious dressing. °Please call and return for scheduled follow up appointment 2 weeks from the time of surgery. ° ° °

## 2016-12-06 NOTE — Anesthesia Postprocedure Evaluation (Signed)
Anesthesia Post Note  Patient: [name] [name]  Procedure(s) Performed: Procedure(s) (LRB): RIGHT L4-5 TRANSFORAMINAL LUMBAR INTERBODY FUSION WITH CAGE, PEDICLE SCREWS AND RODS, BILATERAL L3-4 AND L5-S1 PARTIAL HEMILAMINECTOMY (N/A)  Patient location during evaluation: PACU Anesthesia Type: General Level of consciousness: awake and alert Pain management: pain level controlled Vital Signs Assessment: post-procedure vital signs reviewed and stable Respiratory status: spontaneous breathing, nonlabored ventilation, respiratory function stable and patient connected to nasal cannula oxygen Cardiovascular status: blood pressure returned to baseline and stable Postop Assessment: no signs of nausea or vomiting Anesthetic complications: no       Last Vitals:  Vitals:   12/06/16 1655 12/06/16 1715  BP: 109/69   Pulse: 98 71  Resp: 18 15  Temp: 36.6 C     Last Pain:  Vitals:   12/06/16 1655  TempSrc:   PainSc: 5                  Rameen Quinney DAVID

## 2016-12-06 NOTE — Transfer of Care (Signed)
Immediate Anesthesia Transfer of Care Note  Patient: [name] [name]  Procedure(s) Performed: Procedure(s): RIGHT L4-5 TRANSFORAMINAL LUMBAR INTERBODY FUSION WITH CAGE, PEDICLE SCREWS AND RODS, BILATERAL L3-4 AND L5-S1 PARTIAL HEMILAMINECTOMY (N/A)  Patient Location: PACU  Anesthesia Type:General  Level of Consciousness: awake, alert  and oriented  Airway & Oxygen Therapy: Patient Spontanous Breathing and Patient connected to face mask oxygen  Post-op Assessment: Report given to RN and Post -op Vital signs reviewed and stable  Post vital signs: Reviewed and stable  Last Vitals:  Vitals:   12/06/16 0633 12/06/16 1511  BP: 119/74   Pulse: 87 (!) 108  Resp: 20 10  Temp: 37.1 C 36.9 C    Last Pain:  Vitals:   12/06/16 0633  TempSrc: Oral  PainSc:          Complications: No apparent anesthesia complications

## 2016-12-06 NOTE — Progress Notes (Signed)
Pt noted to have reddened skin areas:  Right abd just above hip 2" x 3"; left abd just above hip 2"x 2", and right breast approx. 2" x3". Pt denies pain or discomfort.  Dr Conrad [place] notified and came to check pt. Floor RN to continue to monitor.

## 2016-12-06 NOTE — Anesthesia Preprocedure Evaluation (Addendum)
Anesthesia Evaluation  Patient identified by MRN, date of birth, ID band Patient awake    Reviewed: Allergy & Precautions, NPO status , Patient's Chart, lab work & pertinent test results  History of Anesthesia Complications (+) PONV  Airway Mallampati: I  TM Distance: >3 FB Neck ROM: Full    Dental   Pulmonary    Pulmonary exam normal        Cardiovascular hypertension, Pt. on medications Normal cardiovascular exam     Neuro/Psych Depression    GI/Hepatic   Endo/Other  S/P Pituitary Adenoma resection  Renal/GU      Musculoskeletal   Abdominal   Peds  Hematology   Anesthesia Other Findings   Reproductive/Obstetrics                            Anesthesia Physical Anesthesia Plan  ASA: III  Anesthesia Plan: General   Post-op Pain Management:    Induction: Intravenous  Airway Management Planned: Oral ETT  Additional Equipment:   Intra-op Plan:   Post-operative Plan: Extubation in OR  Informed Consent: I have reviewed the patients History and Physical, chart, labs and discussed the procedure including the risks, benefits and alternatives for the proposed anesthesia with the patient or authorized representative who has indicated his/her understanding and acceptance.     Plan Discussed with: CRNA and Surgeon  Anesthesia Plan Comments:         Anesthesia Quick Evaluation

## 2016-12-06 NOTE — Anesthesia Procedure Notes (Signed)
Procedure Name: Intubation Date/Time: 12/06/2016 7:48 AM Performed by: Ollen Bowl Pre-anesthesia Checklist: Patient identified, Emergency Drugs available, Patient being monitored, Suction available and Timeout performed Patient Re-evaluated:Patient Re-evaluated prior to inductionOxygen Delivery Method: Circle system utilized and Simple face mask Preoxygenation: Pre-oxygenation with 100% oxygen Intubation Type: IV induction Ventilation: Mask ventilation without difficulty Laryngoscope Size: Miller and 3 Grade View: Grade I Tube type: Oral Tube size: 7.5 mm Number of attempts: 1 Airway Equipment and Method: Patient positioned with wedge pillow and Stylet Placement Confirmation: ETT inserted through vocal cords under direct vision,  positive ETCO2 and breath sounds checked- equal and bilateral Secured at: 21 cm Tube secured with: Tape Dental Injury: Teeth and Oropharynx as per pre-operative assessment

## 2016-12-06 NOTE — H&P (Signed)
[name] [name] is an 51 y.o. female.   Chief Complaint: low back pain and right LE radiculopathy  HPI:  Patient with hx of lumbar stenosis and above complaint presents to the hospital today for surgical intervention.  Progressively worsening symptoms.  Failed conservative treatment.   Past Medical History:  Diagnosis Date  . 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 Arthritis    hnp-lumbar   . Blood dyscrasia    hx probable blood clot after ankle surgery  not definitive but treated per pt  . Cancer ([place])    "early evolvng melanoma "- legs   . Depression   . Diabetes insipidus ([place])   . Family history of adverse reaction to anesthesia    mom has N/V - zofran works  . Heart murmur    years ago-echo 05 no problems  . High cholesterol   . History of blood transfusion    "16 so far" (07/25/2012)  . History of bronchitis   . History of UTI   . Hypertension    no med for 1 yr  . 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 Migraines   . Peripheral vascular disease ([place])    ? dvt 2011 tx as preventive although did not show on scan after ankle surgery  . Pneumonia ~ 2009; 2010  . PONV (postoperative nausea and vomiting)    last 2 surgeries less nausea  . PONV (postoperative nausea and vomiting)    none with last surgery-be extra careful with nose patient haas septal hole     Past Surgical History:  Procedure Laterality Date  . APPENDECTOMY     denies  . BLADDER SUSPENSION  2006  . BREAST BIOPSY  ~ 2010   left breast, benign   . BREAST LUMPECTOMY  ~ 2010   left  . CHOLECYSTECTOMY  2004  . COLONOSCOPY    . DIAGNOSTIC LAPAROSCOPY     2 cysts removed fr. R ovary   . LAMINECTOMY AND MICRODISCECTOMY LUMBAR SPINE  07/25/2012   redo right L4-5 microdiscectomy  . LUMBAR LAMINECTOMY  03/19/2012   Procedure: MICRODISCECTOMY LUMBAR LAMINECTOMY;  Surgeon: [name], MD;  Location: [facility];  Service: Orthopedics;  Laterality:  Right;  MIS Right L3-4 lateral recess decompression and Right L4-5 Microdiscectomy  . LUMBAR LAMINECTOMY  07/25/2012   Procedure: MICRODISCECTOMY LUMBAR LAMINECTOMY;  Surgeon: [name], MD;  Location: [facility];  Service: Orthopedics;  Laterality: N/A;  Re-do Right L4-5 Microdiscectomy  . LUMBAR LAMINECTOMY N/A 02/25/2014   Procedure: Bilateral L4-5 MICRODISCECTOMY with MIS;  Surgeon: [name], MD;  Location: [facility];  Service: Orthopedics;  Laterality: N/A;  . LUMBAR LAMINECTOMY N/A 09/23/2014   Procedure: RIGHT L4-5 AND L5-S1 MICRODISCECTOMIES;  Surgeon: [name], MD;  Location: [facility];  Service: Orthopedics;  Laterality: N/A;  . LUMBAR LAMINECTOMY N/A 10/16/2015   Procedure: REDO MICRODISCECTOMY L4-5;  Surgeon: [name], MD;  Location: [facility];  Service: Orthopedics;  Laterality: N/A;  . MELANOMA EXCISION  06/2011   right lower leg; "early evolving"  . REPAIR PERONEAL TENDONS ANKLE  2010   right  . SHOULDER ARTHROSCOPY  ~ 2009   "frozen shoulder manipulation; right" (07/25/2012)  . TONSILLECTOMY  1977  . TRANSPHENOIDAL PITUITARY RESECTION  04/19/17   at [facility]   have Ovaries    Family History  Problem Relation Age of Onset  .  Hypertension Mother   . Coronary artery disease Mother   . Hypertension Father    Social History:  reports that she has never smoked. She has never used smokeless tobacco. She reports that she drinks about 1.2 oz of alcohol per week . She reports that she does not use drugs.  Allergies:  Allergies  Allergen Reactions  . Amoxicillin Anaphylaxis, Hives and Itching  . Penicillins Anaphylaxis    Has patient had a PCN reaction causing immediate rash, facial/tongue/throat swelling, SOB or lightheadedness with hypotension: Yes Has patient had a PCN reaction causing severe rash involving mucus membranes or skin necrosis: No Has patient had a PCN reaction that required hospitalization Yes Has patient had a PCN reaction occurring  within the last 10 years: No If all of the above answers are "NO", then may proceed with Cephalosporin use.   . Sulfa Antibiotics Hives and Other (See Comments)    Hives, mouth blisters   . Chloroquine Other (See Comments)    Renal dysfunction  . Ciprofloxacin Hives  . Phenergan [Promethazine Hcl] Other (See Comments)    Caused "severe drop in blood pressure."  . Sulfamethoxazole Hives  . Other Other (See Comments)    From allergy testing--Canteloupe  . Shellfish Allergy Itching and Rash  . Vancomycin Cross Reactors Other (See Comments)    "RED MAN SYNDROME"  Pt stated she had no reaction to Vancomycin when it is given slow    Medications Prior to Admission  Medication Sig Dispense Refill  . desmopressin (DDAVP) 0.1 MG tablet Take 0.1 mg by mouth 2 (two) times daily.    Marland Kitchen estradiol (CLIMARA - DOSED IN MG/24 HR) 0.0375 mg/24hr patch Place 0.0375 mg onto the skin once a week. saturdays    . OVER THE COUNTER MEDICATION Take 6 tablets by mouth daily. Smarty Pants Vitamins    . oxyCODONE-acetaminophen (ROXICET) 5-325 MG tablet Take 1 tablet by mouth every 4 (four) hours as needed for severe pain (for breakthrough pain). 18 tablet 0  . Probiotic CAPS Take 1 capsule by mouth daily.     . rosuvastatin (CRESTOR) 20 MG tablet Take 20 mg by mouth daily.    Marland Kitchen topiramate (TOPAMAX) 50 MG tablet Take 150 mg by mouth daily.    . ADDERALL XR 20 MG 24 hr capsule Take 20 mg by mouth daily as needed (concentration).   0  . baclofen (LIORESAL) 10 MG tablet Take 1 tablet (10 mg total) by mouth at bedtime. (Patient not taking: Reported on 12/01/2016) 30 each 0  . chlorpheniramine-HYDROcodone (TUSSIONEX PENNKINETIC ER) 10-8 MG/5ML SUER Take 5 mLs by mouth at bedtime as needed for cough. (Patient not taking: Reported on 12/01/2016) 140 mL 0  . doxycycline (VIBRAMYCIN) 100 MG capsule Take 1 capsule (100 mg total) by mouth 2 (two) times daily. (Patient not taking: Reported on 12/01/2016) 30 capsule 0  . LORazepam  (ATIVAN) 1 MG tablet Take one tablet prior to undergoing MRI to decrease anxiety. Do not take prior to signing consent forms for the radiology facility. (Patient not taking: Reported on 12/01/2016) 1 tablet 0  . methocarbamol (ROBAXIN) 500 MG tablet Take 1 tablet (500 mg total) by mouth 4 (four) times daily. (Patient not taking: Reported on 12/01/2016) 60 tablet 0  . morphine (MS CONTIN) 30 MG 12 hr tablet Take 1 tablet (30 mg total) by mouth every 12 (twelve) hours. (Patient not taking: Reported on 12/01/2016) 40 tablet 0  . mupirocin ointment (BACTROBAN) 2 % Place 1 application into the  nose 2 (two) times daily as needed (infection prevention). Only uses when feeling cold or allergies are starting up or at times of procedures  0  . naproxen sodium (ANAPROX) 220 MG tablet Take 220 mg by mouth 2 (two) times daily with a meal.    . Oxycodone HCl 10 MG TABS Take 1 tablet (10 mg total) by mouth every 6 (six) hours as needed. (Patient not taking: Reported on 12/01/2016) 40 tablet 0  . valACYclovir (VALTREX) 1000 MG tablet Take two tablets as a single dose prn cold sore. (Patient taking differently: Take 2,000 mg by mouth daily as needed. Take two tablets as a single dose prn cold sore.) 20 tablet 1    No results found for this or any previous visit (from the past 48 hour(s)). No results found.  Review of Systems  Constitutional: Negative.   HENT: Negative.   Respiratory: Negative.   Cardiovascular: Negative.   Gastrointestinal: Negative.   Genitourinary: Negative.   Musculoskeletal: Positive for back pain.  Skin: Negative.   Neurological: Positive for tingling.  Psychiatric/Behavioral: Negative.     Blood pressure 119/74, pulse 87, temperature 98.7 F (37.1 C), temperature source Oral, resp. rate 20, SpO2 100 %. Physical Exam  Constitutional: She is oriented to person, place, and time. She appears well-developed. No distress.  HENT:  Head: Normocephalic.  Eyes: EOM are normal. Pupils are equal,  round, and reactive to light.  Neck: Normal range of motion.  Respiratory: No respiratory distress.  GI: She exhibits no distension.  Musculoskeletal: She exhibits tenderness.  Tests  Straight leg raise right: positive  Reflexes  Patellar:  0/4 abnormal Achilles: 0/4 Babinski's sign: normal   Other  Toe Walk: abnormal Heel Walk: abnormal      Neurological: She is alert and oriented to person, place, and time.  Skin: Skin is warm and dry.  Psychiatric: She has a normal mood and affect.     Assessment/Plan Lumbar stenosis, low back pain and right LE radiculopathy  Will proceed with RIGHT L4-5 TRANSFORAMINAL LUMBAR INTERBODY FUSION WITH CAGE, PEDICLE SCREWS AND RODS, BILATERAL L3-4 AND L5-S1 PARTIAL HEMILAMINECTOMY as scheduled.  Surgical procedure along with possible risks and complications discussed.  All questions answered.      Benjiman Core, PA-C 12/06/2016, 6:55 AM

## 2016-12-06 NOTE — Interval H&P Note (Signed)
History and Physical Interval Note:  12/06/2016 7:29 AM  [name] [name]  has presented today for surgery, with the diagnosis of multiple recurrent disc herniations L4-5, spinal stenosis L3-4 and Left L5-S1  The various methods of treatment have been discussed with the patient and family. After consideration of risks, benefits and other options for treatment, the patient has consented to  Procedure(s): RIGHT L4-5 TRANSFORAMINAL LUMBAR INTERBODY FUSION WITH CAGE, PEDICLE SCREWS AND RODS, BILATERAL L3-4 AND L5-S1 PARTIAL HEMILAMINECTOMY (N/A) as a surgical intervention .  The patient's history has been reviewed, patient examined, no change in status, stable for surgery.  I have reviewed the patient's chart and labs.  Questions were answered to the patient's satisfaction.     [name]

## 2016-12-06 NOTE — Brief Op Note (Addendum)
12/06/2016  2:25 PM  PATIENT:  [name] [name]  51 y.o. 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 Conrad [place]  EBL:  Total I/O In: 2950 [I.V.:2800; Blood:150] Out: 930 [Urine:530; Blood:400]  BLOOD ADMINISTERED:150 CC CELLSAVER   COMPLICATION:Right K9-9 2 mm dural tear, repaired with 2 x 4-0 neurolon sutures and duraseal, No neural element injury.   DRAINS: Urinary Catheter (Foley)   LOCAL MEDICATIONS USED:  MARCAINE 0.5% 1:1 EXPAREL 1.3%  Amount: 55ml  SPECIMEN:  No Specimen  DISPOSITION OF SPECIMEN:  N/A  COUNTS:  YES  TOURNIQUET:  * No tourniquets in log *  DICTATION: .Dragon Dictation  PLAN OF CARE: Admit to inpatient   PATIENT DISPOSITION:  PACU - hemodynamically stable.   Delay start of Pharmacological VTE agent (>24hrs) due to surgical blood loss or risk of bleeding: yes

## 2016-12-07 LAB — CBC WITH DIFFERENTIAL/PLATELET
BASOS ABS: 0 10*3/uL (ref 0.0–0.1)
BASOS PCT: 0 %
EOS PCT: 0 %
Eosinophils Absolute: 0 10*3/uL (ref 0.0–0.7)
HCT: 35.1 % — ABNORMAL LOW (ref 36.0–46.0)
Hemoglobin: 11.4 g/dL — ABNORMAL LOW (ref 12.0–15.0)
Lymphocytes Relative: 6 %
Lymphs Abs: 0.8 10*3/uL (ref 0.7–4.0)
MCH: 30.8 pg (ref 26.0–34.0)
MCHC: 32.5 g/dL (ref 30.0–36.0)
MCV: 94.9 fL (ref 78.0–100.0)
MONO ABS: 0.8 10*3/uL (ref 0.1–1.0)
Monocytes Relative: 6 %
NEUTROS ABS: 12.5 10*3/uL — AB (ref 1.7–7.7)
Neutrophils Relative %: 88 %
PLATELETS: 198 10*3/uL (ref 150–400)
RBC: 3.7 MIL/uL — ABNORMAL LOW (ref 3.87–5.11)
RDW: 14.1 % (ref 11.5–15.5)
WBC: 14.2 10*3/uL — ABNORMAL HIGH (ref 4.0–10.5)

## 2016-12-07 LAB — BASIC METABOLIC PANEL
ANION GAP: 8 (ref 5–15)
BUN: 9 mg/dL (ref 6–20)
CALCIUM: 8.7 mg/dL — AB (ref 8.9–10.3)
CO2: 24 mmol/L (ref 22–32)
Chloride: 105 mmol/L (ref 101–111)
Creatinine, Ser: 0.56 mg/dL (ref 0.44–1.00)
Glucose, Bld: 139 mg/dL — ABNORMAL HIGH (ref 65–99)
Potassium: 3.8 mmol/L (ref 3.5–5.1)
Sodium: 137 mmol/L (ref 135–145)

## 2016-12-07 LAB — HEMOGLOBIN A1C
HEMOGLOBIN A1C: 5.2 % (ref 4.8–5.6)
MEAN PLASMA GLUCOSE: 103 mg/dL

## 2016-12-07 LAB — GLUCOSE, CAPILLARY
GLUCOSE-CAPILLARY: 125 mg/dL — AB (ref 65–99)
GLUCOSE-CAPILLARY: 126 mg/dL — AB (ref 65–99)
Glucose-Capillary: 135 mg/dL — ABNORMAL HIGH (ref 65–99)

## 2016-12-07 MED ORDER — PANTOPRAZOLE SODIUM 40 MG PO TBEC
40.0000 mg | DELAYED_RELEASE_TABLET | Freq: Every day | ORAL | Status: DC
  Administered 2016-12-07 – 2016-12-09 (×4): 40 mg via ORAL
  Filled 2016-12-07 (×4): qty 1

## 2016-12-07 MED FILL — Sodium Chloride IV Soln 0.9%: INTRAVENOUS | Qty: 1000 | Status: AC

## 2016-12-07 MED FILL — Heparin Sodium (Porcine) Inj 1000 Unit/ML: INTRAMUSCULAR | Qty: 30 | Status: AC

## 2016-12-07 NOTE — Progress Notes (Signed)
     Subjective: 1 Day Post-Op Procedure(s) (LRB): RIGHT L4-5 TRANSFORAMINAL LUMBAR INTERBODY FUSION WITH CAGE, PEDICLE SCREWS AND RODS, BILATERAL L3-4 AND L5-S1 PARTIAL HEMILAMINECTOMY (N/A) Awake, alert and oriented x 4. In good spirits, walked a few steps in room with PT. Voiding post foley discontinued.  Patient reports pain as moderate.    Objective:   VITALS:  Temp:  [97.5 F (36.4 C)-98.9 F (37.2 C)] 97.5 F (36.4 C) (03/14 1500) Pulse Rate:  [73-80] 73 (03/14 1500) Resp:  [16] 16 (03/14 1500) BP: (84-101)/(41-62) 98/62 (03/14 1500) SpO2:  [99 %-100 %] 100 % (03/14 1500)  Neurologically intact ABD soft Neurovascular intact Sensation intact distally Intact pulses distally Incision: scant drainage Pain in legs and sensation in legs is much improved. Right foot DF strength is 3/5 left 5/5 good quad strength bilaterally.   LABS  Recent Labs  12/07/16 0644  HGB 11.4*  WBC 14.2*  PLT 198    Recent Labs  12/07/16 0644  NA 137  K 3.8  CL 105  CO2 24  BUN 9  CREATININE 0.56  GLUCOSE 139*   No results for input(s): LABPT, INR in the last 72 hours.   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  Small dural tear, no nuchal rigidity or HA.   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  [name] 12/07/2016, 6:08 PM Patient ID: [name] [name], female   DOB: [birth year], 51 y.o.   MRN: [id number]

## 2016-12-07 NOTE — Progress Notes (Signed)
Orthopedic Tech Progress Note Patient Details:  [name] [name] 1966-03-13 [id number]  Patient ID: [name], female   DOB: 06-05-1966, 51 y.o.   MRN: [id number]   Hildred Priest 12/07/2016, 9:29 AM Called in bio-tech brace order; spoke with Bella Kennedy

## 2016-12-07 NOTE — Evaluation (Signed)
Physical Therapy Evaluation Patient Details Name: [name] [name] MRN: [id number] DOB: [birth year] Today's Date: 12/07/2016   History of Present Illness  Admitted with multiple recurrent disc herniations L4-5, spinal stenosis L3-4 and Left L5-S1; now w/p Lumbar fusion and bil L3-4 and L5-S1 partial hemilaminectomies;  has a past medical history of Adrenal gland hypofunction ([place]); Arthritis; Blood dyscrasia; Cancer ([place]); Depression; Diabetes insipidus ([place]); Family history of adverse reaction to anesthesia; Heart murmur;  History of UTI; Hypertension; Malaria by plasmodium vivax (1999); Migraines; Peripheral vascular disease ([place]);  has a past surgical history that includes  Laminectomy and microdiscectomy lumbar spine (07/25/2012); Tonsillectomy (1977); Repair peroneal tendons ankle (2010); Lumbar laminectomy  Appendectomy; and Transphenoidal pituitary resection (04/19/17).  Clinical Impression   Pt admitted with above diagnosis. Pt currently with functional limitations due to the deficits listed below (see PT Problem List). [name] [name] presents with decr activity tolerance, unsteadiness, pain from surgery, and ataxic steps; She became quite nervous when it was difficult to move her RLE to step; Took time to validate pt's frustration and anxiety with the situation, and gain some rapport, with goal of increasing pt's confidence and participation in therapy;   Pt will benefit from skilled PT to increase their independence and safety with mobility to allow discharge to the venue listed below.       Follow Up Recommendations Home health PT;Supervision/Assistance - 24 hour    Equipment Recommendations  3in1 (PT)    Recommendations for Other Services OT consult;Other (comment) (Orthotist consult for updated AFO)     Precautions / Restrictions Precautions Precautions: Back Required Braces or Orthoses: Spinal Brace Spinal Brace: Applied in sitting position      Mobility  Bed Mobility Overal bed  mobility: Needs Assistance Bed Mobility: Rolling;Sidelying to Sit Rolling: Min guard Sidelying to sit: Min guard       General bed mobility comments: Well-versed in log roll technique; minguard for safety and close monitor for activity tolerance and BP  Transfers Overall transfer level: Needs assistance Equipment used: Rolling walker (2 wheeled) Transfers: Sit to/from Stand Sit to Stand: Mod assist         General transfer comment: pt requires incr time and cues for hand placement pt with initial posterior lean  Ambulation/Gait Ambulation/Gait assistance: Mod assist;+2 safety/equipment;Min assist Ambulation Distance (Feet): 5 Feet Assistive device: Rolling walker (2 wheeled) Gait Pattern/deviations: Decreased step length - right;Decreased step length - left     General Gait Details: Initially requiring mod assist to advance RLE, less need with more steps; cues to self- monitor for actrivity tolerance; ataxic steps  Stairs            Wheelchair Mobility    Modified [name] (Stroke Patients Only)       Balance Overall balance assessment: Needs assistance         Standing balance support: Bilateral upper extremity supported Standing balance-Leahy Scale: Poor Standing balance comment: Unsteady on feet; quivering of bil LEs, dependent on UE support                             Pertinent Vitals/Pain Pain Assessment: 0-10 Pain Score: 9  Pain Location: back Pain Descriptors / Indicators: Constant Pain Intervention(s): RN gave pain meds during session    Home Living Family/patient expects to be discharged to:: Private residence Living Arrangements: Other (Comment) (staying at boyfriends ) Available Help at Discharge: Family Type of Home: Other(Comment) (townhome) Home Access: Stairs to enter Entrance  Stairs-Rails: None Entrance Stairs-Number of Steps: 2 Home Layout: Two level;Able to live on main level with bedroom/bathroom Home Equipment: Gilford Rile  - 2 wheels;Adaptive equipment;Cane - single point (AFO for R shoe) Additional Comments: mother RN and will be there for 2 weeks to start    Prior Function Level of Independence: Independent with assistive device(s)               Hand Dominance   Dominant Hand: Right    Extremity/Trunk Assessment   Upper Extremity Assessment Upper Extremity Assessment: Defer to OT evaluation    Lower Extremity Assessment Lower Extremity Assessment: Defer to PT evaluation RLE Deficits / Details: R ankle dorsiflexion 2/5; Noting weakness with RLE advancement, initially requiring assist, then less need with more steps    Cervical / Trunk Assessment Cervical / Trunk Assessment: Other exceptions (multiple back surgeries)  Communication   Communication: No difficulties  Cognition Arousal/Alertness: Awake/alert Behavior During Therapy: WFL for tasks assessed/performed Overall Cognitive Status: Within Functional Limits for tasks assessed                      General Comments General comments (skin integrity, edema, etc.): Monitored BPs closely during activity and they were stable during activity; RN notified of pt's response to activity     Exercises     Assessment/Plan    PT Assessment Patient needs continued PT services  PT Problem List Decreased strength;Decreased range of motion;Decreased activity tolerance;Decreased balance;Decreased mobility;Decreased coordination;Decreased knowledge of use of DME;Decreased knowledge of precautions;Pain       PT Treatment Interventions DME instruction;Gait training;Stair training;Functional mobility training;Therapeutic activities;Therapeutic exercise;Balance training;Patient/family education;Neuromuscular re-education    PT Goals (Current goals can be found in the Care Plan section)  Acute Rehab PT Goals Patient Stated Goal: to get back to moving R LE PT Goal Formulation: With patient Time For Goal Achievement: 12/21/16 Potential to  Achieve Goals: Good    Frequency Min 5X/week   Barriers to discharge        Co-evaluation PT/OT/SLP Co-Evaluation/Treatment: Yes Reason for Co-Treatment: Complexity of the patient's impairments (multi-system involvement);Necessary to address cognition/behavior during functional activity;For patient/therapist safety;To address functional/ADL transfers PT goals addressed during session: Mobility/safety with mobility OT goals addressed during session: ADL's and self-care;Proper use of Adaptive equipment and DME;Strengthening/ROM       End of Session Equipment Utilized During Treatment: Back brace Activity Tolerance: Patient limited by pain Patient left: in chair;with call bell/phone within reach Nurse Communication: Mobility status;Patient requests pain meds;Other (comment) (BP response to activity) PT Visit Diagnosis: Ataxic gait (R26.0);Unsteadiness on feet (R26.81);Pain Pain - part of body:  (back)         Time: 1610-9604 PT Time Calculation (min) (ACUTE ONLY): 30 min   Charges:   PT Evaluation $PT Eval Moderate Complexity: 1 Procedure     PT G CodesColletta [place] 12/07/2016, 2:04 PM  Roney Marion, [place] Pager [phone] Office [phone]

## 2016-12-07 NOTE — Evaluation (Addendum)
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   Clinical Impression   Patient is s/p L5-s1 surgery resulting in functional limitations due to the deficits listed below (see OT problem list). PTA was independent with a hx of R foot drop Patient will benefit from skilled OT acutely to increase independence and safety with ADLS to allow discharge [place] with 3n1.  Orthostatic BPs  Supine 118/78  Sitting 139/74     Standing 123/66  Sitting after 3 min 106/69        Follow Up Recommendations  Home health OT    Equipment Recommendations  3 in 1 bedside commode    Recommendations for Other Services       Precautions / Restrictions Precautions Precautions: Back Required Braces or Orthoses: Spinal Brace Spinal Brace: Applied in sitting position      Mobility Bed Mobility Overal bed mobility: Needs Assistance Bed Mobility: Rolling;Sidelying to Sit Rolling: Min guard Sidelying to sit: Min guard       General bed mobility comments: Well-versed in log roll technique; minguard for safety and close monitor for activity tolerance and  BP  Transfers Overall transfer level: Needs assistance Equipment used: Rolling walker (2 wheeled) Transfers: Sit to/from Stand Sit to Stand: Mod assist         General transfer comment: pt requires incr time and cues for hand placement pt with initial posterior lean    Balance Overall balance assessment: Needs assistance                                          ADL Overall ADL's : Needs assistance/impaired Eating/Feeding: Independent   Grooming: Wash/dry hands;Wash/dry face;Independent   Upper Body Bathing: Minimal assistance   Lower Body Bathing: Maximal assistance           Toilet Transfer: Moderate assistance Toilet Transfer Details (indicate cue type and reason): pt with unsteady ataxic static standing         Functional mobility during ADLs: Rolling walker;Moderate assistance General ADL Comments: pt requires several (A) to activate R LE. pt ataxic in movement       Vision Baseline Vision/History: Wears glasses Wears Glasses: Reading only       Perception     Praxis      Pertinent Vitals/Pain Pain Assessment: 0-10 Pain Score: 9  Pain Location: back Pain Descriptors / Indicators: Constant Pain Intervention(s): RN gave pain meds during session     Hand Dominance Right   Extremity/Trunk Assessment Upper Extremity Assessment Upper Extremity Assessment: Defer to OT evaluation   Lower Extremity Assessment Lower Extremity Assessment: Defer to PT evaluation RLE Deficits / Details: R ankle dorsiflexion 2/5; Noting weakness with RLE advancement, initially requiring assist, then less need with  more steps   Cervical / Trunk Assessment Cervical / Trunk Assessment: Other exceptions (multiple back surgeries)   Communication Communication Communication: No difficulties   Cognition Arousal/Alertness: Awake/alert Behavior During Therapy: WFL for tasks assessed/performed Overall Cognitive Status: Within Functional Limits for tasks  assessed                     General Comments       Exercises       Shoulder Instructions      Home Living Family/patient expects to be discharged to:: Private residence Living Arrangements: Other (Comment) (staying at boyfriends ) Available Help at Discharge: Family Type of Home: Other(Comment) (townhome) Home Access: Stairs to enter Technical brewer of Steps: 2 Entrance Stairs-Rails: None Home Layout: Two level;Able to live on main level with bedroom/bathroom     Bathroom Shower/Tub: Teacher, early years/pre: Standard     Home Equipment: Environmental consultant - 2 wheels;Adaptive equipment;Cane - single point (AFO for R shoe) Adaptive Equipment: Reacher Additional Comments: mother RN and will be there for 2 weeks to start      Prior Functioning/Environment Level of Independence: Independent with assistive device(s)                 OT Problem List: Decreased strength;Decreased activity tolerance;Impaired balance (sitting and/or standing);Decreased safety awareness;Decreased knowledge of use of DME or AE;Decreased knowledge of precautions;Pain      OT Treatment/Interventions: Self-care/ADL training;Therapeutic exercise;DME and/or AE instruction;Therapeutic activities;Patient/family education;Balance training    OT Goals(Current goals can be found in the care plan section) Acute Rehab OT Goals Patient Stated Goal: to get back to moving R LE OT Goal Formulation: With patient Time For Goal Achievement: 12/21/16 Potential to Achieve Goals: Good  OT Frequency: Min 2X/week   Barriers to D/C:            Co-evaluation PT/OT/SLP Co-Evaluation/Treatment: Yes Reason for Co-Treatment: Complexity of the patient's impairments (multi-system involvement);Necessary to address cognition/behavior during functional activity;For patient/therapist safety;To address functional/ADL transfers PT goals addressed during session: Mobility/safety with mobility OT goals  addressed during session: ADL's and self-care;Proper use of Adaptive equipment and DME;Strengthening/ROM      End of Session Equipment Utilized During Treatment: Gait belt;Rolling walker;Back brace Nurse Communication: Mobility status;Precautions  Activity Tolerance: Patient tolerated treatment well Patient left: in chair;with call bell/phone within reach;with nursing/sitter in room  OT Visit Diagnosis: Unsteadiness on feet (R26.81)                ADL either performed or assessed with clinical judgement  Time: 3299-2426 OT Time Calculation (min): 51 min Charges:  OT General Charges $OT Visit: 1 Procedure OT Evaluation $OT Eval Moderate Complexity: 1 Procedure OT Treatments $Self Care/Home Management : 23-37 mins G-Codes:      Jeri Modena   OTR/L Pager: [phone] Office: [phone] .   Parke Poisson B 12/07/2016, 1:57 PM

## 2016-12-08 ENCOUNTER — Encounter (HOSPITAL_COMMUNITY): Payer: Self-pay | Admitting: General Practice

## 2016-12-08 DIAGNOSIS — Z8639 Personal history of other endocrine, nutritional and metabolic disease: Secondary | ICD-10-CM

## 2016-12-08 DIAGNOSIS — I959 Hypotension, unspecified: Secondary | ICD-10-CM

## 2016-12-08 DIAGNOSIS — M48062 Spinal stenosis, lumbar region with neurogenic claudication: Secondary | ICD-10-CM

## 2016-12-08 DIAGNOSIS — M48061 Spinal stenosis, lumbar region without neurogenic claudication: Secondary | ICD-10-CM | POA: Diagnosis present

## 2016-12-08 DIAGNOSIS — E2740 Unspecified adrenocortical insufficiency: Secondary | ICD-10-CM

## 2016-12-08 DIAGNOSIS — Z86018 Personal history of other benign neoplasm: Secondary | ICD-10-CM

## 2016-12-08 DIAGNOSIS — M5126 Other intervertebral disc displacement, lumbar region: Secondary | ICD-10-CM

## 2016-12-08 LAB — BASIC METABOLIC PANEL
Anion gap: 4 — ABNORMAL LOW (ref 5–15)
BUN: 11 mg/dL (ref 6–20)
CALCIUM: 8.5 mg/dL — AB (ref 8.9–10.3)
CO2: 27 mmol/L (ref 22–32)
CREATININE: 0.7 mg/dL (ref 0.44–1.00)
Chloride: 112 mmol/L — ABNORMAL HIGH (ref 101–111)
Glucose, Bld: 104 mg/dL — ABNORMAL HIGH (ref 65–99)
Potassium: 4 mmol/L (ref 3.5–5.1)
SODIUM: 143 mmol/L (ref 135–145)

## 2016-12-08 LAB — CBC
HCT: 34 % — ABNORMAL LOW (ref 36.0–46.0)
Hemoglobin: 11 g/dL — ABNORMAL LOW (ref 12.0–15.0)
MCH: 31.7 pg (ref 26.0–34.0)
MCHC: 32.4 g/dL (ref 30.0–36.0)
MCV: 98 fL (ref 78.0–100.0)
PLATELETS: 182 10*3/uL (ref 150–400)
RBC: 3.47 MIL/uL — ABNORMAL LOW (ref 3.87–5.11)
RDW: 15.1 % (ref 11.5–15.5)
WBC: 10.8 10*3/uL — ABNORMAL HIGH (ref 4.0–10.5)

## 2016-12-08 MED ORDER — METHYLPREDNISOLONE 4 MG PO TBPK
8.0000 mg | ORAL_TABLET | Freq: Every morning | ORAL | Status: AC
  Administered 2016-12-08: 8 mg via ORAL
  Filled 2016-12-08: qty 21

## 2016-12-08 MED ORDER — METHYLPREDNISOLONE 4 MG PO TBPK
4.0000 mg | ORAL_TABLET | ORAL | Status: AC
  Administered 2016-12-08: 4 mg via ORAL

## 2016-12-08 MED ORDER — OXYCODONE HCL ER 15 MG PO T12A
30.0000 mg | EXTENDED_RELEASE_TABLET | Freq: Two times a day (BID) | ORAL | Status: DC
  Administered 2016-12-08 – 2016-12-10 (×5): 30 mg via ORAL
  Filled 2016-12-08 (×5): qty 2

## 2016-12-08 MED ORDER — METHYLPREDNISOLONE 4 MG PO TBPK
8.0000 mg | ORAL_TABLET | Freq: Every evening | ORAL | Status: AC
Start: 2016-12-09 — End: 2016-12-09
  Administered 2016-12-09: 8 mg via ORAL

## 2016-12-08 MED ORDER — METHYLPREDNISOLONE 4 MG PO TBPK
4.0000 mg | ORAL_TABLET | Freq: Four times a day (QID) | ORAL | Status: DC
  Administered 2016-12-10 (×2): 4 mg via ORAL

## 2016-12-08 MED ORDER — METHYLPREDNISOLONE 4 MG PO TBPK
8.0000 mg | ORAL_TABLET | Freq: Every evening | ORAL | Status: AC
  Administered 2016-12-08: 8 mg via ORAL

## 2016-12-08 MED ORDER — METHYLPREDNISOLONE 4 MG PO TBPK
4.0000 mg | ORAL_TABLET | Freq: Three times a day (TID) | ORAL | Status: AC
  Administered 2016-12-09 (×3): 4 mg via ORAL

## 2016-12-08 NOTE — Progress Notes (Signed)
     Subjective: 2 Days Post-Op Procedure(s) (LRB): RIGHT L4-5 TRANSFORAMINAL LUMBAR INTERBODY FUSION WITH CAGE, PEDICLE SCREWS AND RODS, BILATERAL L3-4 AND L5-S1 PARTIAL HEMILAMINECTOMY (N/A) Awake, alert and oriented x 4, missed dose of medication last evening and experiencing worsening pain this AM.   Patient reports pain as moderate.    Objective:   VITALS:  Temp:  [97.5 F (36.4 C)-98.6 F (37 C)] 98.3 F (36.8 C) (03/15 0528) Pulse Rate:  [73-83] 83 (03/15 0528) Resp:  [16-20] 20 (03/15 0528) BP: (98-108)/(55-62) 106/61 (03/15 0528) SpO2:  [97 %-100 %] 97 % (03/15 0528)  Neurologically intact ABD soft Sensation intact distally Incision: dressing C/D/I Strength in the right foot DF is 3/5, left is 5/5, pain with hip flexion.   LABS  Recent Labs  12/07/16 0644 12/08/16 0608  HGB 11.4* 11.0*  WBC 14.2* 10.8*  PLT 198 182    Recent Labs  12/07/16 0644 12/08/16 0608  NA 137 143  K 3.8 4.0  CL 105 112*  CO2 24 27  BUN 9 11  CREATININE 0.56 0.70  GLUCOSE 139* 104*   No results for input(s): LABPT, INR in the last 72 hours.   Assessment/Plan: 2 Days Post-Op Procedure(s) (LRB): RIGHT L4-5 TRANSFORAMINAL LUMBAR INTERBODY FUSION WITH CAGE, PEDICLE SCREWS AND RODS, BILATERAL L3-4 AND L5-S1 PARTIAL HEMILAMINECTOMY (N/A)  Advance diet Up with therapy D/C IV fluids  Will start a steroid taper for post op coverage of adrenal insufficinecy.  [name] 12/08/2016, 8:35 AM Patient ID: [name], female   DOB: 08/31/1966, 51 y.o.   MRN: [id number]

## 2016-12-08 NOTE — Care Management Note (Signed)
Case Management Note  Patient Details  Name: [name] [name] MRN: [id number] Date of Birth: April 02, 1966  Subjective/Objective:    51 yr old female s/p L4-5TLIF, L3-4, L4-5 Hemilaminectomy.                Action/Plan: Case manager spoke with patient and her husband concerning discharge plans and DME needs. Choice for Home Health agency was offered. Patient states she has used [facility] in the past and wishes to do so now. CM called referral to Stevie Kern, Liaison for [facility].  Patient will have family support at [place].   Expected Discharge Date:   12/09/16               Expected Discharge Plan:  [place]  In-House Referral:  NA  Discharge planning Services  CM Consult  Post Acute Care Choice:  Durable Medical Equipment, Home Health Choice offered to:  Patient  DME Arranged:  3-N-1, Walker rolling DME Agency:  [place]:  PT, OT [facility] Agency:  [place]  Status of Service:  Completed, signed off  If discussed at [place] of Stay Meetings, dates discussed:    Additional Comments:  Ninfa Meeker, RN 12/08/2016, 2:51 PM

## 2016-12-08 NOTE — Consult Note (Signed)
Medical Consultation   [name] [name]  ZCH:[id number]  DOB: 1966-02-26  DOA: 12/06/2016   Outpatient Specialists:  Endocrinology: Roxy Cedar, MD [facility] Neurosurgery: Angelene Giovanni, MD [facility] ENT: Anner Crete, MD [facility]   Requesting physician: Dr. Louanne Skye  Reason for consultation: Post op hypotension  History of Present Illness: [name] [name] is an 51 y.o. 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 Post op course complicated by diabetes insipidus and nasal septal defect. Pt also with hx lumbar stenosis with hx several back surgeries (2013, 2014, 2015, 2017), depression. Per pt, she has experienced post op hypotension in the past requiring a 2 month prednisone taper in 2013.   Pt underwent back surgery per Dr Louanne Skye on 12/06/16 dut to multiple recurrent disc herniations and spinal stenosis. Post operatively, patient has developed hypotension with reported SBP in 50s on 3/14. Pt received 100mg  Solucortef last evening and again this am. She has now been started on Medrol dose pack. BP has remained stable since receiving IV steroids. [facility] has been asked to consult to help manage adrenal insufficiency.    Review of Systems:  ROS As per HPI otherwise 10 point review of systems negative.    Past Medical History: Past Medical History:  Diagnosis Date  . 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 Arthritis    hnp-lumbar   . Blood dyscrasia    hx probable blood clot after ankle surgery  not definitive but treated per pt  . Cancer ([place])    "early evolvng melanoma "- legs   . Depression   . Diabetes insipidus ([place])   . Family history of adverse reaction to anesthesia    mom has N/V - zofran works  . Heart murmur    years ago-echo 05 no problems  . High cholesterol   . History of blood transfusion    "16 so far" (07/25/2012)  .  History of bronchitis   . History of UTI   . Hypertension    no med for 1 yr  . 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 Migraines   . Peripheral vascular disease ([place])    ? dvt 2011 tx as preventive although did not show on scan after ankle surgery  . Pneumonia ~ 2009; 2010  . PONV (postoperative nausea and vomiting)    last 2 surgeries less nausea  . PONV (postoperative nausea and vomiting)    none with last surgery-be extra careful with nose patient haas septal hole     Past Surgical History: Past Surgical History:  Procedure Laterality Date  . APPENDECTOMY     denies  . BLADDER SUSPENSION  2006  . BREAST BIOPSY  ~ 2010   left breast, benign   . BREAST LUMPECTOMY  ~ 2010   left  . CHOLECYSTECTOMY  2004  . COLONOSCOPY    . DIAGNOSTIC LAPAROSCOPY     2 cysts removed fr. R ovary   . LAMINECTOMY AND MICRODISCECTOMY LUMBAR SPINE  07/25/2012   redo right L4-5 microdiscectomy  . LUMBAR LAMINECTOMY  03/19/2012   Procedure: MICRODISCECTOMY LUMBAR LAMINECTOMY;  Surgeon: [name], MD;  Location: [place];  Service: Orthopedics;  Laterality: Right;  MIS Right L3-4 lateral recess decompression and Right L4-5 Microdiscectomy  . LUMBAR LAMINECTOMY  07/25/2012   Procedure: MICRODISCECTOMY LUMBAR LAMINECTOMY;  Surgeon: [name], MD;  Location: [place];  Service: Orthopedics;  Laterality: N/A;  Re-do Right L4-5 Microdiscectomy  . LUMBAR LAMINECTOMY N/A 02/25/2014   Procedure: Bilateral L4-5 MICRODISCECTOMY with MIS;  Surgeon: [name], MD;  Location: [place];  Service: Orthopedics;  Laterality: N/A;  . LUMBAR LAMINECTOMY N/A 09/23/2014   Procedure: RIGHT L4-5 AND L5-S1 MICRODISCECTOMIES;  Surgeon: [name], MD;  Location: [place];  Service: Orthopedics;  Laterality: N/A;  . LUMBAR LAMINECTOMY N/A 10/16/2015   Procedure: REDO MICRODISCECTOMY L4-5;  Surgeon: [name], MD;  Location: [place];  Service: Orthopedics;  Laterality: N/A;  . MELANOMA EXCISION  06/2011   right lower leg; "early  evolving"  . REPAIR PERONEAL TENDONS ANKLE  2010   right  . SHOULDER ARTHROSCOPY  ~ 2009   "frozen shoulder manipulation; right" (07/25/2012)  . TONSILLECTOMY  1977  . TRANSPHENOIDAL PITUITARY RESECTION  04/19/17   at [place]   have Ovaries     Allergies:   Allergies  Allergen Reactions  . Amoxicillin Anaphylaxis, Hives and Itching  . Penicillins Anaphylaxis    Has patient had a PCN reaction causing immediate rash, facial/tongue/throat swelling, SOB or lightheadedness with hypotension: Yes Has patient had a PCN reaction causing severe rash involving mucus membranes or skin necrosis: No Has patient had a PCN reaction that required hospitalization Yes Has patient had a PCN reaction occurring within the last 10 years: No If all of the above answers are "NO", then may proceed with Cephalosporin use.   . Sulfa Antibiotics Hives and Other (See Comments)    Hives, mouth blisters   . Chloroquine Other (See Comments)    Renal dysfunction  . Ciprofloxacin Hives  . Phenergan [Promethazine Hcl] Other (See Comments)    Caused "severe drop in blood pressure."  . Sulfamethoxazole Hives  . Other Other (See Comments)    From allergy testing--Canteloupe  . Shellfish Allergy Itching and Rash  . Vancomycin Cross Reactors Other (See Comments)    "RED MAN SYNDROME"  Pt stated she had no reaction to Vancomycin when it is given slow     Social History:  reports that she has never smoked. She has never used smokeless tobacco. She reports that she drinks about 1.2 oz of alcohol per week . She reports that she does not use drugs.   Family History: Family History  Problem Relation Age of Onset  . Hypertension Mother   . Coronary artery disease Mother   . Hypertension Father      Physical Exam: Vitals:   12/07/16 0713 12/07/16 1500 12/07/16 2127 12/08/16 0528  BP: (!) 101/54 98/62 (!) 108/55 106/61  Pulse:  73 80 83  Resp:  16 20 20   Temp:  97.5 F (36.4 C)  98.6 F (37 C) 98.3 F (36.8 C)  TempSrc:  Oral Oral Oral  SpO2:  100% 100% 97%    Constitutional:  Alert and awake, oriented x3, not in any acute distress. Eyes: EOMI, irises appear normal, anicteric sclera,  ENMT: mucous membranes are moist. No oropharyngeal lesions are noted Neck: neck appears normal, no masses, normal ROM, no thyromegaly, no JVD  CVS: S1-S2 clear, no murmur rubs or gallops, no LE edema, normal pedal pulses  Respiratory:  clear to auscultation bilaterally, no wheezing, rales or rhonchi. Respiratory effort normal. No accessory muscle use.  Abdomen: soft nontender, nondistended, normal bowel sounds, no hepatosplenomegaly Neuro: Cranial nerves II-XII intact, strength, sensation, reflexes Psych:  judgement and insight appear normal, stable mood and affect, mental status Skin: no suspicious rashes or lesions    Data reviewed:  I have personally reviewed following labs and imaging studies Labs:  CBC:  Recent Labs Lab 12/02/16 0902 12/07/16 0644 12/08/16 0608  WBC 6.0 14.2* 10.8*  NEUTROABS  --  12.5*  --   HGB 13.7 11.4* 11.0*  HCT 42.5 35.1* 34.0*  MCV 95.9 94.9 98.0  PLT 272 198 809    Basic Metabolic Panel:  Recent Labs Lab 12/02/16 0902 12/07/16 0644 12/08/16 0608  NA 140 137 143  K 4.1 3.8 4.0  CL 105 105 112*  CO2 28 24 27   GLUCOSE 102* 139* 104*  BUN 16 9 11   CREATININE 0.79 0.56 0.70  CALCIUM 9.5 8.7* 8.5*   GFR Estimated Creatinine Clearance: 90.5 mL/min (by C-G formula based on SCr of 0.7 mg/dL). Liver Function Tests:  Recent Labs Lab 12/02/16 0902  AST 20  ALT 16  ALKPHOS 96  BILITOT 0.5  PROT 6.8  ALBUMIN 4.1   No results for input(s): LIPASE, AMYLASE in the last 168 hours. No results for input(s): AMMONIA in the last 168 hours. Coagulation profile  Recent Labs Lab 12/02/16 0902  INR 0.88    Cardiac Enzymes: No results for input(s): CKTOTAL, CKMB, CKMBINDEX, TROPONINI in the last 168 hours. BNP: Invalid input(s):  POCBNP CBG:  Recent Labs Lab 12/06/16 2320 12/07/16 0725 12/07/16 1214 12/07/16 1617  GLUCAP 144* 135* 125* 126*   D-Dimer No results for input(s): DDIMER in the last 72 hours. Hgb A1c  Recent Labs  12/06/16 1835  HGBA1C 5.2   Lipid Profile No results for input(s): CHOL, HDL, LDLCALC, TRIG, CHOLHDL, LDLDIRECT in the last 72 hours. Thyroid function studies No results for input(s): TSH, T4TOTAL, T3FREE, THYROIDAB in the last 72 hours.  Invalid input(s): FREET3 Anemia work up No results for input(s): VITAMINB12, FOLATE, FERRITIN, TIBC, IRON, RETICCTPCT in the last 72 hours. Urinalysis    Component Value Date/Time   COLORURINE YELLOW 12/02/2016 0902   APPEARANCEUR HAZY (A) 12/02/2016 0902   LABSPEC 1.005 12/02/2016 0902   PHURINE 6.0 12/02/2016 0902   GLUCOSEU NEGATIVE 12/02/2016 0902   HGBUR NEGATIVE 12/02/2016 0902   BILIRUBINUR NEGATIVE 12/02/2016 0902   KETONESUR NEGATIVE 12/02/2016 0902   PROTEINUR NEGATIVE 12/02/2016 0902   UROBILINOGEN 0.2 07/24/2012 1223   NITRITE NEGATIVE 12/02/2016 0902   LEUKOCYTESUR NEGATIVE 12/02/2016 0902     Microbiology Recent Results (from the past 240 hour(s))  Surgical pcr screen     Status: None   Collection Time: 12/02/16  9:01 AM  Result Value Ref Range Status   MRSA, PCR NEGATIVE NEGATIVE Final   Staphylococcus aureus NEGATIVE NEGATIVE Final    Comment:        The Xpert SA Assay (FDA approved for NASAL specimens in patients over 78 years of age), is one component of a comprehensive surveillance program.  Test performance has been validated by [facility] for patients greater than or equal to 80 year old. It is not intended to diagnose infection nor to guide or monitor treatment.        Inpatient Medications:   Scheduled Meds: . desmopressin  0.1 mg Oral BID  . docusate sodium  100 mg Oral BID  . [START ON 12/10/2016] estradiol  0.0375 mg Transdermal Weekly  . gabapentin  300 mg Oral TID  .  methylPREDNISolone  4 mg Oral PC lunch  . methylPREDNISolone  4 mg Oral PC supper  . [  START ON 12/09/2016] methylPREDNISolone  4 mg Oral 3 x daily with food  . [START ON 12/10/2016] methylPREDNISolone  4 mg Oral 4X daily taper  . methylPREDNISolone  8 mg Oral Nightly  . [START ON 12/09/2016] methylPREDNISolone  8 mg Oral Nightly  . oxyCODONE  30 mg Oral Q12H  . pantoprazole  40 mg Oral QHS  . rosuvastatin  20 mg Oral Daily  . sodium chloride flush  3 mL Intravenous Q12H  . topiramate  150 mg Oral Daily   Continuous Infusions: . sodium chloride    . 0.9 % NaCl with KCl 20 mEq / L Stopped (12/07/16 1011)     Radiological Exams on Admission: Dg Lumbar Spine Complete  Result Date: 12/06/2016 CLINICAL DATA:  L4-5 TLIF with L3-4 and L5-S1 decompression EXAM: LUMBAR SPINE - COMPLETE 4+ VIEW; DG C-ARM 61-120 MIN FLUOROSCOPY TIME:  85 seconds COMPARISON:  MRI lumbar spine dated 09/25/2016 FINDINGS: Intraoperative fluoroscopic radiographs demonstrating L4-5 PLIF, in satisfactory position. IMPRESSION: Intraoperative fluoroscopic radiographs during L4-5 PLIF, as above. Electronically Signed   By: Julian Hy M.D.   On: 12/06/2016 14:32   Dg C-arm 1-60 Min  Result Date: 12/06/2016 CLINICAL DATA:  L4-5 TLIF with L3-4 and L5-S1 decompression EXAM: LUMBAR SPINE - COMPLETE 4+ VIEW; DG C-ARM 61-120 MIN FLUOROSCOPY TIME:  85 seconds COMPARISON:  MRI lumbar spine dated 09/25/2016 FINDINGS: Intraoperative fluoroscopic radiographs demonstrating L4-5 PLIF, in satisfactory position. IMPRESSION: Intraoperative fluoroscopic radiographs during L4-5 PLIF, as above. Electronically Signed   By: Julian Hy M.D.   On: 12/06/2016 14:32    Impression/Recommendations Principal Problem:   Herniated intervertebral disc of lumbar spine Active Problems:   Recurrent herniation of lumbar disc   Adrenal insufficiency (HCC)   History of pituitary adenoma   Hypotension in setting of adrenal insufficiency with hx  of pituitary adenoma s/p resection 03/2016 -BP seems to have stabilized with IV steroids last night and this am and current po steroids. -Would recommend slow steroid taper with close outpt follow-up with Dr. Quay Burow -Will monitor BP to determine need to adjust current treatment.   Thank you for this consultation.  Our [facility] hospitalist team will follow the patient with you.  Patrici Ranks, NP-C [facility] [place]  pgr [phone]

## 2016-12-08 NOTE — Progress Notes (Signed)
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    PT Comments    Patient is progressing toward mobility goals and more tolerable of mobility this session. Continue to progress as tolerated with anticipated d/c home with HHPT.    Follow Up Recommendations  Home health PT;Supervision/Assistance - 24 hour     Equipment Recommendations  3in1 (PT)    Recommendations for Other Services OT consult;Other (comment) (Orthotist consult for updated AFO)     Precautions / Restrictions Precautions Precautions: Back Required Braces or Orthoses: Spinal Brace Spinal Brace: Applied in sitting position    Mobility  Bed Mobility               General bed mobility comments: pt sitting EOB upon arrival  Transfers Overall transfer level: Needs assistance Equipment used: Rolling walker (2 wheeled) Transfers: Sit to/from Stand Sit to Stand: Min assist         General transfer comment: assist to power up into standing and to steady upon stand; cues for hand placement  Ambulation/Gait Ambulation/Gait assistance: Mod assist;+2 safety/equipment (chair  follow) Ambulation Distance (Feet): 120 Feet Assistive device: Rolling walker (2 wheeled) Gait Pattern/deviations: Decreased step length - right;Decreased step length - left Gait velocity: decreased   General Gait Details: mod A for balance; pt with ataxic R steps and R LE weakness; pt with improved R LE advancement with continuous movement and difficulty bringing R LE forward after stopping   Financial trader [name] (Stroke Patients Only)       Balance Overall balance assessment: Needs assistance         Standing balance support: Bilateral upper extremity supported Standing balance-Leahy Scale: Poor Standing balance comment: Unsteady on feet; quivering of bil LEs, dependent on UE support                    Cognition Arousal/Alertness: Awake/alert Behavior During Therapy: WFL for tasks assessed/performed Overall Cognitive Status: Within Functional Limits for tasks assessed                      Exercises      General Comments General comments (skin integrity, edema, etc.): pt able to don back brace with max cues and assist to align      Pertinent Vitals/Pain Pain Assessment: Faces Faces Pain Scale: No hurt Pain Intervention(s): Monitored during session    Home Living                      Prior Function            PT Goals (current goals can now be found in the care plan section) Acute Rehab PT Goals Patient Stated Goal: less pain; back to studies  PT Goal Formulation: With patient Time For Goal Achievement: 12/21/16 Potential to Achieve Goals: Good Progress towards PT goals: Progressing toward goals    Frequency    Min 5X/week      PT Plan Current plan remains appropriate    Co-evaluation             End of Session Equipment Utilized During Treatment: Back brace Activity Tolerance: Patient tolerated treatment well Patient left: in chair;with call bell/phone within reach Nurse  Communication: Mobility status PT Visit Diagnosis: Ataxic gait (R26.0);Unsteadiness on feet (R26.81);Pain Pain - part of body:  (back)     Time: 5400-8676 PT Time Calculation (min) (ACUTE ONLY): 29 min  Charges:  $Gait Training: 8-22 mins $Therapeutic Activity: 8-22 mins                    G Codes:       Salina April, PTA Pager: [phone]   12/08/2016, 1:46 PM

## 2016-12-08 NOTE — Progress Notes (Signed)
Pt was ambulating in hall with assistance of RN and her husband. Pt is very unsteady on her feet, and requires a walker. After about 30 feet, pt stated, "I need to sit" as she was leaning backwards. She was assisted to chair and then back to bed. BP was 118/63 and pt repeatedly kept saying "I feel fine" and "please don't tell anyone about this, I'm okay." Mickel Baas, NP who saw pt earlier in the day was notified. Received order for orthostatic vitals (see below). Pt was symptomatic during event, but after we sat her down and got her back to bed, she denied dizziness or lightheadedness. When initial BP was taken, pt seemed very anxious about her BP and that she would have additional issues. Will continue to monitor.     12/08/16 1718  Orthostatic Lying   BP- Lying 99/52  Pulse- Lying 79  Orthostatic Sitting  BP- Sitting 128/75  Pulse- Sitting 90  Orthostatic Standing at 0 minutes  BP- Standing at 0 minutes 123/71  Pulse- Standing at 0 minutes 92  Orthostatic Standing at 3 minutes  BP- Standing at 3 minutes 113/65  Pulse- Standing at 3 minutes 84

## 2016-12-09 DIAGNOSIS — M5126 Other intervertebral disc displacement, lumbar region: Secondary | ICD-10-CM

## 2016-12-09 MED ORDER — MECLIZINE HCL 25 MG PO TABS
25.0000 mg | ORAL_TABLET | Freq: Three times a day (TID) | ORAL | Status: DC
  Administered 2016-12-09 – 2016-12-10 (×4): 25 mg via ORAL
  Filled 2016-12-09 (×5): qty 1

## 2016-12-09 MED ORDER — METHOCARBAMOL 500 MG PO TABS: 500.0000 mg | ORAL_TABLET | Freq: Three times a day (TID) | ORAL | 1 refills | Status: DC | PRN

## 2016-12-09 MED ORDER — OXYCODONE HCL 5 MG PO TABS: 5.0000 mg | ORAL_TABLET | ORAL | 0 refills | Status: DC | PRN

## 2016-12-09 MED ORDER — SENNOSIDES-DOCUSATE SODIUM 8.6-50 MG PO TABS
1.0000 | ORAL_TABLET | Freq: Two times a day (BID) | ORAL | Status: DC
  Administered 2016-12-09 – 2016-12-10 (×3): 1 via ORAL
  Filled 2016-12-09 (×3): qty 1

## 2016-12-09 MED ORDER — MECLIZINE HCL 25 MG PO TABS: 25.0000 mg | ORAL_TABLET | Freq: Three times a day (TID) | ORAL | Status: DC | PRN

## 2016-12-09 MED ORDER — OXYCODONE HCL ER 30 MG PO T12A: 30.0000 mg | EXTENDED_RELEASE_TABLET | Freq: Two times a day (BID) | ORAL | 0 refills | Status: DC

## 2016-12-09 MED ORDER — POLYETHYLENE GLYCOL 3350 17 G PO PACK
17.0000 g | PACK | Freq: Every day | ORAL | Status: DC
  Administered 2016-12-09 – 2016-12-10 (×2): 17 g via ORAL
  Filled 2016-12-09 (×2): qty 1

## 2016-12-09 MED ORDER — METHYLPREDNISOLONE 4 MG PO TBPK: ORAL_TABLET | ORAL | 0 refills | Status: DC

## 2016-12-09 NOTE — Progress Notes (Addendum)
     Subjective: 3 Days Post-Op Procedure(s) (LRB): RIGHT L4-5 TRANSFORAMINAL LUMBAR INTERBODY FUSION WITH CAGE, PEDICLE SCREWS AND RODS, BILATERAL L3-4 AND L5-S1 PARTIAL HEMILAMINECTOMY (N/A)Awake alert and oriented x 4. Pain is such that she needs a MSO4 IV injection tonight for pain post PT.   Patient reports pain as marked.    Objective:   VITALS:  Temp:  [97.8 F (36.6 C)-98.8 F (37.1 C)] 97.8 F (36.6 C) (03/16 0607) Pulse Rate:  [80-89] 81 (03/16 1459) Resp:  [15-17] 17 (03/16 1459) BP: (114-127)/(58-73) 114/58 (03/16 1459) SpO2:  [99 %-100 %] 99 % (03/16 1459)  Neurologically intact ABD soft Neurovascular intact Sensation intact distally Intact pulses distally Dorsiflexion/Plantar flexion intact Incision: no drainage   LABS  Recent Labs  12/07/16 0644 12/08/16 0608  HGB 11.4* 11.0*  WBC 14.2* 10.8*  PLT 198 182    Recent Labs  12/07/16 0644 12/08/16 0608  NA 137 143  K 3.8 4.0  CL 105 112*  CO2 24 27  BUN 9 11  CREATININE 0.56 0.70  GLUCOSE 139* 104*   No results for input(s): LABPT, INR in the last 72 hours.   Assessment/Plan: 3 Days Post-Op Procedure(s) (LRB): RIGHT L4-5 TRANSFORAMINAL LUMBAR INTERBODY FUSION WITH CAGE, PEDICLE SCREWS AND RODS, BILATERAL L3-4 AND L5-S1 PARTIAL HEMILAMINECTOMY (N/A)  Advance diet Up with therapy D/C IV fluids Plan for discharge tomorrow  Hold discharge today as she requires IV narcotic.  Continue with Oxycontin q 12 and Oxy IR q 3-4  For breakthrough pain. Assess and consider for discharge in AM.  [name] 12/09/2016, 50:14 PM Patient ID: [name] [name], female   DOB: [birth year], 51 y.o.   MRN: [id number]

## 2016-12-09 NOTE — Progress Notes (Signed)
[facility] Consultation Progress Note  Patient: [name] [name] UXN:[id number]   PCP: Tivis Ringer, MD DOB: [birth year]   DOA: 12/06/2016   DOS: 12/09/2016   Date of Service: the patient was seen and examined on 12/09/2016 Primary service: [name], MD   Subjective: primary complain is pain which appears to be moderately controlled. But pt was seen right after her therapy session, dizziness was present on therapy session end per pt, although therapy note mentioned no symptoms.   Brief hospital course: [name] [name] is an 51 y.o. 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 Post op course complicated by diabetes insipidus and nasal septal defect. Pt also with hx lumbar stenosis with hx several back surgeries (2013, 2014, 2015, 2017), depression. Per pt, she has experienced post op hypotension in the past requiring a 2 month prednisone taper in 2013.   Pt underwent back surgery per Dr Louanne Skye on 12/06/16 dut to multiple recurrent disc herniations and spinal stenosis. Post operatively, patient has developed hypotension with reported SBP in 50s on 3/14. Pt received 100mg  Solucortef last evening and again this am. She has now been started on Medrol dose pack. BP has remained stable since receiving IV steroids. [facility] has been asked to consult to help manage adrenal insufficiency.   Assessment and Plan: 1. Adrenal insufficiency  Hypopituitarism Diabetes insipidus Hypotension in setting of adrenal insufficiency with hx of pituitary adenoma s/p resection 03/2016 -BP seems to have stabilized with IV steroids and current po steroids. No orthostasis.  At present I feel her adrenal insufficiency is adequately treated and would Recommend to continue low dose medrol until seen by primary endocrinology in 2 weeks. Stop IV fluids.  Had normal cosyntropin stimulation test in [facility] last December.  2. Dizziness Still problem, no  focal deficit, no vertigo Add scheduled meclizine.  3. Constipation  Add bowel regimen   Bowel regimen: last BM 03/12 Diet: regular diet DVT Prophylaxis: subcutaneous Heparin  Family Communication: no family was present at bedside, at the time of interview.   Disposition: We will continue to follow the patient.  Pt can be medically discharge at home once her pain is well controlled without any worsening of dizziness.  Recommendation on discharge: Please continue medrol dose pack taper, when on 4 mg daily, will continue until seen by primary endocrinology Dr Quay Burow, pt will schedule an appointment. Continue meclizine PRN TID Avoid driving and operating heavy machinery while still having dizziness  Other Consultants: none Procedures: RIGHT L4-5 TRANSFORAMINAL LUMBAR INTERBODY FUSION WITH CAGE, PEDICLE SCREWS AND RODS, BILATERAL L3-4 AND L5-S1 PARTIAL HEMILAMINECTOMY Antibiotics: Anti-infectives    Start     Dose/Rate Route Frequency Ordered Stop   12/06/16 2200  clindamycin (CLEOCIN) IVPB 900 mg     900 mg 100 mL/hr over 30 Minutes Intravenous Every 8 hours 12/06/16 2116 12/07/16 1413   12/06/16 1742  valACYclovir (VALTREX) tablet 1,000 mg  Status:  Discontinued    Comments:  Take two tablets as a single dose prn cold sore.     1,000 mg Oral Daily PRN 12/06/16 1742 12/06/16 1745   12/06/16 0734  vancomycin (VANCOCIN) 1-5 GM/200ML-% IVPB    Comments:  Flowers, [place]   : cabinet override      12/06/16 0734 12/06/16 1944   12/06/16 0700  clindamycin (CLEOCIN) IVPB 900 mg     900 mg 100 mL/hr over 30 Minutes Intravenous To [facility] 12/05/16 1149 12/06/16 1406  Objective: Physical Exam: Vitals:   12/08/16 2118 12/09/16 0607 12/09/16 1045 12/09/16 1459  BP: 115/73 118/71 127/69 (!) 114/58  Pulse: 85 80 89 81  Resp: 15 15 16 17   Temp: 98.8 F (37.1 C) 97.8 F (36.6 C)    TempSrc: Oral Oral    SpO2: 100% 99% 99% 99%    Intake/Output Summary (Last 24 hours)  at 12/09/16 1533 Last data filed at 12/09/16 1500  Gross per 24 hour  Intake             1443 ml  Output                0 ml  Net             1443 ml   There were no vitals filed for this visit.  General: Alert, Awake and Oriented to Time, Place and Person. Appear in no distress, affect appropriate Eyes: PERRL, Conjunctiva normal ENT: Oral Mucosa clear moist. Neck: no JVD, no Abnormal Mass Or lumps Cardiovascular: S1 and S2 Present, no Murmur, Peripheral Pulses Present Respiratory: Bilateral Air entry equal and Decreased, no use of accessory muscle, Clear to Auscultation, no Crackles, no wheezes Abdomen: Bowel Sound present, Soft and no tenderness Skin: redness no, no Rash, no induration Extremities: no Pedal edema, no calf tenderness Neurologic: Grossly no focal neuro deficit. Bilaterally Equal motor strength  Data Reviewed: CBC:  Recent Labs Lab 12/07/16 0644 12/08/16 0608  WBC 14.2* 10.8*  NEUTROABS 12.5*  --   HGB 11.4* 11.0*  HCT 35.1* 34.0*  MCV 94.9 98.0  PLT 198 093   Basic Metabolic Panel:  Recent Labs Lab 12/07/16 0644 12/08/16 0608  NA 137 143  K 3.8 4.0  CL 105 112*  CO2 24 27  GLUCOSE 139* 104*  BUN 9 11  CREATININE 0.56 0.70  CALCIUM 8.7* 8.5*   Liver Function Tests: No results for input(s): AST, ALT, ALKPHOS, BILITOT, PROT, ALBUMIN in the last 168 hours. No results for input(s): LIPASE, AMYLASE in the last 168 hours. No results for input(s): AMMONIA in the last 168 hours. Cardiac Enzymes: No results for input(s): CKTOTAL, CKMB, CKMBINDEX, TROPONINI in the last 168 hours. BNP (last 3 results) No results for input(s): BNP in the last 8760 hours. CBG:  Recent Labs Lab 12/06/16 2320 12/07/16 0725 12/07/16 1214 12/07/16 1617  GLUCAP 144* 135* 125* 126*   Recent Results (from the past 240 hour(s))  Surgical pcr screen     Status: None   Collection Time: 12/02/16  9:01 AM  Result Value Ref Range Status   MRSA, PCR NEGATIVE NEGATIVE Final     Staphylococcus aureus NEGATIVE NEGATIVE Final    Comment:        The Xpert SA Assay (FDA approved for NASAL specimens in patients over 75 years of age), is one component of a comprehensive surveillance program.  Test performance has been validated by [facility] for patients greater than or equal to 59 year old. It is not intended to diagnose infection nor to guide or monitor treatment.     Studies: No results found.   Scheduled Meds: . desmopressin  0.1 mg Oral BID  . [START ON 12/10/2016] estradiol  0.0375 mg Transdermal Weekly  . gabapentin  300 mg Oral TID  . meclizine  25 mg Oral TID  . methylPREDNISolone  4 mg Oral 3 x daily with food  . [START ON 12/10/2016] methylPREDNISolone  4 mg Oral 4X daily taper  . methylPREDNISolone  8  mg Oral Nightly  . oxyCODONE  30 mg Oral Q12H  . pantoprazole  40 mg Oral QHS  . polyethylene glycol  17 g Oral Daily  . rosuvastatin  20 mg Oral Daily  . senna-docusate  1 tablet Oral BID  . sodium chloride flush  3 mL Intravenous Q12H  . topiramate  150 mg Oral Daily   Continuous Infusions: . sodium chloride     PRN Meds: acetaminophen **OR** acetaminophen, alum & mag hydroxide-simeth, bisacodyl, chlorpheniramine-HYDROcodone, menthol-cetylpyridinium **OR** phenol, methocarbamol **OR** methocarbamol (ROBAXIN)  IV, morphine injection, ondansetron **OR** ondansetron (ZOFRAN) IV, oxyCODONE, polyethylene glycol, sodium chloride flush, sodium phosphate  Time spent: 35 minutes  Author: Berle Mull, MD [facility] Pager: [phone] 12/09/2016 3:33 PM  If 7PM-7AM, please contact night-coverage at [email or web address], password [facility]

## 2016-12-09 NOTE — Progress Notes (Signed)
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    PT Comments    Patient is progressing toward mobility goals however continues to demo balance deficits and weakness. Pt with c/o lightheadedness post stair negotiation this session and BP 114/58 in sitting. Pt denied lightheadedness after ~3 mins of seated rest. Continue to progress as tolerated.     Follow Up Recommendations  Home health PT;Supervision/Assistance - 24 hour     Equipment Recommendations  3in1 (PT)    Recommendations for Other Services OT consult;Other (comment) (Orthotist consult for updated AFO)     Precautions / Restrictions Precautions Precautions: Back Required Braces or Orthoses: Spinal Brace Spinal Brace: Applied in sitting position    Mobility  Bed Mobility               General bed mobility comments: pt sitting EOB upon arrival  Transfers Overall transfer level: Needs assistance Equipment used: Rolling walker (2 wheeled) Transfers: Sit to/from Stand Sit to Stand: Min assist;Min guard         General transfer comment: min guard to stand and min A to steady from EOB and  commode with use of grab bar; cues for maintaining back precautions  Ambulation/Gait Ambulation/Gait assistance: Min assist;Mod assist;+2 safety/equipment (chair follow) Ambulation Distance (Feet): 80 Feet Assistive device: Rolling walker (2 wheeled) Gait Pattern/deviations: Step-through pattern;Decreased stride length Gait velocity: decreased   General Gait Details: min A initially and then after stair negotiation mod A for about 1 ft; pt very unsteady and R LE appeared to very weak; cues for posture    Stairs Stairs: Yes   Stair Management: No rails;Step to pattern;Backwards;With walker Number of Stairs: 2 General stair comments: cues for sequencing and technique; mod A for balance when weigth shifting especially for eccentric loading and to stabilize RW  Wheelchair Mobility    Modified [name] (Stroke Patients Only)       Balance Overall balance assessment: Needs assistance         Standing balance support: Bilateral upper extremity supported Standing balance-Leahy Scale: Poor Standing balance comment: Unsteady on feet; quivering of bil LEs, dependent on UE support                    Cognition Arousal/Alertness: Awake/alert Behavior During Therapy: WFL for tasks assessed/performed Overall Cognitive Status: Within Functional Limits for tasks assessed                      Exercises      General Comments General comments (skin integrity, edema, etc.): pt became lightheaded immediately following stair negotiation; BP 114/58 in sitting       Pertinent Vitals/Pain Pain Assessment: Faces Faces Pain Scale: Hurts even more Pain Location: hips with mobility Pain Descriptors / Indicators: Constant;Aching Pain Intervention(s): Limited activity within patient's tolerance;Monitored during session;Repositioned;Premedicated  before session    Home Living                      Prior Function            PT Goals (current goals can now be found in  the care plan section) Acute Rehab PT Goals Patient Stated Goal: less pain; back to studies Progress towards PT goals: Progressing toward goals    Frequency    Min 5X/week      PT Plan Current plan remains appropriate    Co-evaluation             End of Session Equipment Utilized During Treatment: Back brace Activity Tolerance: Other (comment) (limited by lightheadedness) Patient left: in chair;with call bell/phone within reach Nurse Communication: Mobility status PT Visit Diagnosis: Ataxic gait (R26.0);Unsteadiness on feet (R26.81);Pain Pain - part of body:  (back)     Time: 0930-1003 PT Time Calculation (min) (ACUTE ONLY): 33 min  Charges:  $Gait Training: 8-22 mins $Therapeutic Activity: 8-22 mins                    G Codes:       Salina April, PTA Pager: [phone]   12/09/2016, 10:25 AM

## 2016-12-10 MED ORDER — MECLIZINE HCL 25 MG PO TABS: 25.0000 mg | ORAL_TABLET | Freq: Three times a day (TID) | ORAL | 0 refills | Status: DC | PRN

## 2016-12-10 MED ORDER — POLYETHYLENE GLYCOL 3350 17 G PO PACK: 17.0000 g | PACK | Freq: Every day | ORAL | 0 refills | Status: DC | PRN

## 2016-12-10 MED ORDER — METHYLPREDNISOLONE 4 MG PO TBPK: ORAL_TABLET | ORAL | 0 refills | Status: DC

## 2016-12-10 MED ORDER — SENNOSIDES-DOCUSATE SODIUM 8.6-50 MG PO TABS: 1.0000 | ORAL_TABLET | Freq: Two times a day (BID) | ORAL | 0 refills | Status: DC

## 2016-12-10 NOTE — Progress Notes (Signed)
     Subjective: 4 Days Post-Op Procedure(s) (LRB): RIGHT L4-5 TRANSFORAMINAL LUMBAR INTERBODY FUSION WITH CAGE, PEDICLE SCREWS AND RODS, BILATERAL L3-4 AND L5-S1 PARTIAL HEMILAMINECTOMY (N/A) Awake, alert and Oriented x 4. Mild discomfort, still with some feelings of dizziness, taking antivert for this. Walking in the hallway with standby assistance about 150'. Trouble remembering how to place brace on.   Patient reports pain as moderate.    Objective:   VITALS:  Temp:  [98.2 F (36.8 C)-98.6 F (37 C)] 98.2 F (36.8 C) (03/17 0700) Pulse Rate:  [72-89] 72 (03/17 0700) Resp:  [16-20] 20 (03/17 0700) BP: (109-127)/(58-73) 109/67 (03/17 0700) SpO2:  [98 %-100 %] 98 % (03/17 0700)  Neurologically intact ABD soft Sensation intact distally Intact pulses distally Dorsiflexion/Plantar flexion intact Incision: dressing C/D/I   LABS  Recent Labs  12/08/16 0608  HGB 11.0*  WBC 10.8*  PLT 182    Recent Labs  12/08/16 0608  NA 143  K 4.0  CL 112*  CO2 27  BUN 11  CREATININE 0.70  GLUCOSE 104*   No results for input(s): LABPT, INR in the last 72 hours.   Assessment/Plan: 4 Days Post-Op Procedure(s) (LRB): RIGHT L4-5 TRANSFORAMINAL LUMBAR INTERBODY FUSION WITH CAGE, PEDICLE SCREWS AND RODS, BILATERAL L3-4 AND L5-S1 PARTIAL HEMILAMINECTOMY (N/A)  Up with therapy Discharge home with home health  I expect that her dizziness will improve as mild anemia improves and she builds her strength. Right foot DF is 4/5 today, best I have witnessed.  Will D/C with plan to see in  1 1/2 to 2 weeks in f/u. Should see Dr. Radford Pax to be sure endocrine function is  At best.  [name] 12/10/2016, 9:37 AM Patient ID: [name] [name], female   DOB: 07/26/1966, 51 y.o.   MRN: [id number]

## 2016-12-10 NOTE — Progress Notes (Signed)
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    PT Comments    Patient seen with boyfriend present, states she will have 24 hour support from her mother on DC home x 2 weeks.  Patient is progressing, still having intermittent lightheaded episodes (possible from unrelated issue), and unsteadiness with standing, especially after activity.  MIN Guard overall, instructed patient not to ambulate alone due to unsteadiness.  Possibility of patient DC today, spent additional time with multiple reviews of don/doff back brace and sizing/use of newly arrived personal walker and 3-in-1 commode.  Will benefit from continued therapy in hospital, is able to transition when medically appropriate.   Follow Up Recommendations  Home health PT;Supervision/Assistance - 24 hour     Equipment Recommendations  3in1 (PT)    Recommendations for Other Services OT consult;Other (comment) (Orthotist consult for updated AFO)     Precautions / Restrictions Precautions Precautions: Back Required Braces or Orthoses: Spinal Brace Spinal Brace: Applied in sitting position Restrictions Weight  Bearing Restrictions: No    Mobility  Bed Mobility               General bed mobility comments: pt sitting in bedside chair upon arrival  Transfers Overall transfer level: Needs assistance Equipment used: Rolling walker (2 wheeled) Transfers: Sit to/from Stand Sit to Stand: Min guard         General transfer comment: min guard to steady for initial stand, and at times during activity.  Ambulation/Gait Ambulation/Gait assistance: +2 safety/equipment;Min assist (chair and BP machine to follow) Ambulation Distance (Feet): 90 Feet (2x) Assistive device: Rolling walker (2 wheeled) Gait Pattern/deviations: Step-through pattern;Decreased stride length Gait velocity: decreased   General Gait Details: Right leg shaking and less steady, chronic issue related to spine surgery, per patient is improving.   Stairs            Wheelchair Mobility    Modified [name] (Stroke Patients Only)       Balance Overall balance assessment: Needs assistance         Standing balance support: Bilateral upper extremity supported Standing balance-Leahy Scale: Poor Standing balance comment: Unsteady on feet; quivering of bil LEs, dependent on UE support                    Cognition Arousal/Alertness: Awake/alert Behavior During Therapy: WFL for tasks assessed/performed Overall Cognitive Status: Within Functional Limits for tasks assessed                      Exercises      General Comments General comments (skin integrity, edema, etc.): Episodes of lightheadedness after moderate activity, BP readings OK, per patient is transient.      Pertinent Vitals/Pain Pain Assessment: 0-10 Pain Score: 5  Pain Location: Low back Pain Descriptors / Indicators: Constant;Aching Pain  Intervention(s): Monitored during session;Premedicated before session    Home Living                      Prior Function            PT Goals (current goals can now be found in  the care plan section) Acute Rehab PT Goals Patient Stated Goal: less pain; back to studies PT Goal Formulation: With patient Time For Goal Achievement: 12/21/16 Potential to Achieve Goals: Good Progress towards PT goals: Progressing toward goals    Frequency    Min 5X/week      PT Plan Current plan remains appropriate    Co-evaluation             End of Session Equipment Utilized During Treatment: Back brace Activity Tolerance: Patient tolerated treatment well;Patient limited by pain Patient left: in chair;with call bell/phone within reach;with family/visitor present Nurse Communication: Mobility status PT Visit Diagnosis: Ataxic gait (R26.0);Unsteadiness on feet (R26.81);Pain Pain - part of body:  (back)     Time: 0900-1000 PT Time Calculation (min) (ACUTE ONLY): 60 min  Charges:  $Gait Training: 23-37 mins $Therapeutic Activity: 23-37 mins                    G Codes:       Melik Blancett L 2017/01/05, 10:16 AM

## 2016-12-10 NOTE — Discharge Summary (Signed)
Physician Discharge Summary      Patient ID: [name] [name] MRN: [id number] DOB/AGE: 10-31-1965 51 y.o.  Admit date: 12/06/2016 Discharge date: 12/10/2016  Admission Diagnoses:  Principal Problem:   Herniated intervertebral disc of lumbar spine Active Problems:   Spinal stenosis of lumbar region   Recurrent herniation of lumbar disc   Adrenal insufficiency ([place])   History of pituitary adenoma   Discharge Diagnoses:  Same  Past Medical History:  Diagnosis Date  . 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 Arthritis    hnp-lumbar   . Blood dyscrasia    hx probable blood clot after ankle surgery  not definitive but treated per pt  . Cancer ([place])    "early evolvng melanoma "- legs   . Depression   . Diabetes insipidus ([place])   . Family history of adverse reaction to anesthesia    mom has N/V - zofran works  . Heart murmur    years ago-echo 05 no problems  . High cholesterol   . History of blood transfusion    "16 so far" (07/25/2012)  . History of bronchitis   . History of UTI   . Hypertension    no med for 1 yr  . 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 Migraines   . Peripheral vascular disease ([place])    ? dvt 2011 tx as preventive although did not show on scan after ankle surgery  . Pneumonia ~ 2009; 2010  . PONV (postoperative nausea and vomiting)    last 2 surgeries less nausea  . PONV (postoperative nausea and vomiting)    none with last surgery-be extra careful with nose patient haas septal hole     Surgeries: Procedure(s): RIGHT L4-5 TRANSFORAMINAL LUMBAR INTERBODY FUSION WITH CAGE, PEDICLE SCREWS AND RODS, BILATERAL L3-4 AND L5-S1 PARTIAL HEMILAMINECTOMY on 12/06/2016   Consultants: Treatment Team:  Elwin Mocha, MD Kelvin Cellar, MD  Discharged Condition: Improved  Hospital Course: [name] [name] is an 51 y.o. female who was admitted 12/06/2016 with a chief complaint of No chief  complaint on file. , and found to have a diagnosis of Herniated intervertebral disc of lumbar spine.  She was brought to the operating room on 12/06/2016 and underwent the above named procedures.    She was given perioperative antibiotics:  Anti-infectives    Start     Dose/Rate Route Frequency Ordered Stop   12/06/16 2200  clindamycin (CLEOCIN) IVPB 900 mg     900 mg 100 mL/hr over 30 Minutes Intravenous Every 8 hours 12/06/16 2116 12/07/16 1413   12/06/16 1742  valACYclovir (VALTREX) tablet 1,000 mg  Status:  Discontinued    Comments:  Take two tablets as a single dose prn cold sore.     1,000 mg Oral Daily PRN 12/06/16 1742 12/06/16 1745   12/06/16 0734  vancomycin (VANCOCIN) 1-5 GM/200ML-% IVPB    Comments:  Flowers, [place]   : cabinet override      12/06/16 0734 12/06/16 1944   12/06/16 0700  clindamycin (CLEOCIN) IVPB 900 mg     900 mg 100 mL/hr over 30 Minutes Intravenous To [facility] 12/05/16 1149 12/06/16 1406    Intraoperatively she had a minute dural tear repaired with neurolon x 2 sutures and Duraseal. Intraoperative blood loss 400cc with 150cc of cell saver returned found to have severe spinal stenosis at L3-4 and recurrent HNP right L4-5. Post operatively recovered in PACU  without difficulty and was transferred to [facility] floor [facility]. There she Experienced significant pain and required narcotic medications of oxycontin and oxyIR. POD#1 episodes of hypotension despited adequate urine output and normal BUN/CR ratio. Anesthesia did not provide peri operative adrenal coverage but post operatively she was given hydrocortisone 100mg  qid. Hgb was stable at 11.1 and Hct 35% electrolytes were normal K 3.8-4.0. POD# 2 fewer episodes of light headedness and improve BP but even so she noted worsened pain, consistent with exparel wearing off And her oxycontin was increased to 30 mg q 12 hours.A medicine consult obtained for assistance with her pan hypopituitary concerns  including adrenal insufficiency. Medicine continued a methylprednisone dose pak taper and  Later recommended that she remain of methylpredinisone 4 mg per day after she completes the pak post hospitalization. POD#3 Awake alert and oriented, participated in therapy. Blood pressure appeared to stablize With systolic in the 161-096 range, diastolic also remained stable. She had her dressing changed, no drainage or fluctuance noted, no sign of infection. She however required IV narcotics to quell pain post PT. She was also slurrign woeds due to narcotics and demonstrated off balance during her PT so that she was kept hospitalized for this day. POD#4 her dressing was dry and her motor exam showed improved functionin the right foot dorsiflexion to 4/5, left 5/5. Right quad was weak at 5-/5. She participated with PT and  Was able to ambulate to the PT exercise room and work with PT on stairs. She was discharged home on POD#4. At than time she was requiring stand by assistance in walking and showed some weakness in right leg strength. This was  Better than preoperative strength testing. No headaches or nausea, no photophobia or nuchal rigidity, no sign of CSF leak. All questions were answered prior to her discharge.   She was given sequential compression devices and early ambulationfor DVT prophylaxis.  She benefited maximally from their hospital stay and there were no complications.    Recent vital signs:  Vitals:   12/09/16 2100 12/10/16 0700  BP: 115/73 109/67  Pulse: 74 72  Resp: 18 20  Temp: 98.6 F (37 C) 98.2 F (36.8 C)    Recent laboratory studies:  Results for orders placed or performed during the hospital encounter of 12/06/16  Hemoglobin A1c  Result Value Ref Range   Hgb A1c MFr Bld 5.2 4.8 - 5.6 %   Mean Plasma Glucose 103 mg/dL  CBC with Differential/Platelet  Result Value Ref Range   WBC 14.2 (H) 4.0 - 10.5 K/uL   RBC 3.70 (L) 3.87 - 5.11 MIL/uL   Hemoglobin 11.4 (L) 12.0 -  15.0 g/dL   HCT 35.1 (L) 36.0 - 46.0 %   MCV 94.9 78.0 - 100.0 fL   MCH 30.8 26.0 - 34.0 pg   MCHC 32.5 30.0 - 36.0 g/dL   RDW 14.1 11.5 - 15.5 %   Platelets 198 150 - 400 K/uL   Neutrophils Relative % 88 %   Neutro Abs 12.5 (H) 1.7 - 7.7 K/uL   Lymphocytes Relative 6 %   Lymphs Abs 0.8 0.7 - 4.0 K/uL   Monocytes Relative 6 %   Monocytes Absolute 0.8 0.1 - 1.0 K/uL   Eosinophils Relative 0 %   Eosinophils Absolute 0.0 0.0 - 0.7 K/uL   Basophils Relative 0 %   Basophils Absolute 0.0 0.0 - 0.1 K/uL  Basic metabolic panel  Result Value Ref Range   Sodium 137 135 -  145 mmol/L   Potassium 3.8 3.5 - 5.1 mmol/L   Chloride 105 101 - 111 mmol/L   CO2 24 22 - 32 mmol/L   Glucose, Bld 139 (H) 65 - 99 mg/dL   BUN 9 6 - 20 mg/dL   Creatinine, Ser 0.56 0.44 - 1.00 mg/dL   Calcium 8.7 (L) 8.9 - 10.3 mg/dL   GFR calc non Af Amer >60 >60 mL/min   GFR calc Af Amer >60 >60 mL/min   Anion gap 8 5 - 15  Glucose, capillary  Result Value Ref Range   Glucose-Capillary 144 (H) 65 - 99 mg/dL  Glucose, capillary  Result Value Ref Range   Glucose-Capillary 135 (H) 65 - 99 mg/dL  Glucose, capillary  Result Value Ref Range   Glucose-Capillary 125 (H) 65 - 99 mg/dL  Glucose, capillary  Result Value Ref Range   Glucose-Capillary 126 (H) 65 - 99 mg/dL  CBC  Result Value Ref Range   WBC 10.8 (H) 4.0 - 10.5 K/uL   RBC 3.47 (L) 3.87 - 5.11 MIL/uL   Hemoglobin 11.0 (L) 12.0 - 15.0 g/dL   HCT 34.0 (L) 36.0 - 46.0 %   MCV 98.0 78.0 - 100.0 fL   MCH 31.7 26.0 - 34.0 pg   MCHC 32.4 30.0 - 36.0 g/dL   RDW 15.1 11.5 - 15.5 %   Platelets 182 150 - 400 K/uL  Basic metabolic panel  Result Value Ref Range   Sodium 143 135 - 145 mmol/L   Potassium 4.0 3.5 - 5.1 mmol/L   Chloride 112 (H) 101 - 111 mmol/L   CO2 27 22 - 32 mmol/L   Glucose, Bld 104 (H) 65 - 99 mg/dL   BUN 11 6 - 20 mg/dL   Creatinine, Ser 0.70 0.44 - 1.00 mg/dL   Calcium 8.5 (L) 8.9 - 10.3 mg/dL   GFR calc non Af Amer >60 >60 mL/min    GFR calc Af Amer >60 >60 mL/min   Anion gap 4 (L) 5 - 15    Discharge Medications:   Allergies as of 12/10/2016      Reactions   Amoxicillin Anaphylaxis, Hives, Itching   Penicillins Anaphylaxis   Has patient had a PCN reaction causing immediate rash, facial/tongue/throat swelling, SOB or lightheadedness with hypotension: Yes Has patient had a PCN reaction causing severe rash involving mucus membranes or skin necrosis: No Has patient had a PCN reaction that required hospitalization Yes Has patient had a PCN reaction occurring within the last 10 years: No If all of the above answers are "NO", then may proceed with Cephalosporin use.   Sulfa Antibiotics Hives, Other (See Comments)   Hives, mouth blisters   Chloroquine Other (See Comments)   Renal dysfunction   Ciprofloxacin Hives   Phenergan [promethazine Hcl] Other (See Comments)   Caused "severe drop in blood pressure."   Sulfamethoxazole Hives   Other Other (See Comments)   From allergy testing--Canteloupe   Shellfish Allergy Itching, Rash   Vancomycin Cross Reactors Other (See Comments)   "RED MAN SYNDROME"  Pt stated she had no reaction to Vancomycin when it is given slow      Medication List    STOP taking these medications   morphine 30 MG 12 hr tablet Commonly known as:  MS CONTIN   naproxen sodium 220 MG tablet Commonly known as:  ANAPROX   oxyCODONE-acetaminophen 5-325 MG tablet Commonly known as:  ROXICET     TAKE these medications   ADDERALL XR 20  MG 24 hr capsule Generic drug:  amphetamine-dextroamphetamine Take 20 mg by mouth daily as needed (concentration).   baclofen 10 MG tablet Commonly known as:  LIORESAL Take 1 tablet (10 mg total) by mouth at bedtime.   chlorpheniramine-HYDROcodone 10-8 MG/5ML Suer Commonly known as:  TUSSIONEX PENNKINETIC ER Take 5 mLs by mouth at bedtime as needed for cough.   desmopressin 0.1 MG tablet Commonly known as:  DDAVP Take 0.1 mg by mouth 2 (two) times daily.     doxycycline 100 MG capsule Commonly known as:  VIBRAMYCIN Take 1 capsule (100 mg total) by mouth 2 (two) times daily.   estradiol 0.0375 mg/24hr patch Commonly known as:  CLIMARA - Dosed in mg/24 hr Place 0.0375 mg onto the skin once a week. saturdays   meclizine 25 MG tablet Commonly known as:  ANTIVERT Take 1 tablet (25 mg total) by mouth 3 (three) times daily as needed for dizziness.   methocarbamol 500 MG tablet Commonly known as:  ROBAXIN Take 1 tablet (500 mg total) by mouth 4 (four) times daily. What changed:  Another medication with the same name was added. Make sure you understand how and when to take each.   methocarbamol 500 MG tablet Commonly known as:  ROBAXIN Take 1 tablet (500 mg total) by mouth every 8 (eight) hours as needed for muscle spasms. What changed:  You were already taking a medication with the same name, and this prescription was added. Make sure you understand how and when to take each.   methylPREDNISolone 4 MG Tbpk tablet Commonly known as:  MEDROL DOSEPAK Take 4 tablets for day#1  then 3 tablets for day#2 then 2 tablets for day#3 then 1 tablet every day until seen by endocrinologist. Notes to patient:  Next dose is due this evening around 6 pm   mupirocin ointment 2 % Commonly known as:  BACTROBAN Place 1 application into the nose 2 (two) times daily as needed (infection prevention). Only uses when feeling cold or allergies are starting up or at times of procedures   OVER THE COUNTER MEDICATION Take 6 tablets by mouth daily. Smarty Pants Vitamins   oxyCODONE 5 MG immediate release tablet Commonly known as:  Oxy IR/ROXICODONE Take 1-2 tablets (5-10 mg total) by mouth every 3 (three) hours as needed for breakthrough pain. What changed:  medication strength  how much to take  when to take this  reasons to take this   oxyCODONE 30 MG 12 hr tablet Take 30 mg by mouth every 12 (twelve) hours. What changed:  You were already taking a  medication with the same name, and this prescription was added. Make sure you understand how and when to take each.   polyethylene glycol packet Commonly known as:  MIRALAX / GLYCOLAX Take 17 g by mouth daily as needed for mild constipation.   Probiotic Caps Take 1 capsule by mouth daily.   rosuvastatin 20 MG tablet Commonly known as:  CRESTOR Take 20 mg by mouth daily.   senna-docusate 8.6-50 MG tablet Commonly known as:  Senokot-S Take 1 tablet by mouth 2 (two) times daily.   topiramate 50 MG tablet Commonly known as:  TOPAMAX Take 150 mg by mouth daily.   valACYclovir 1000 MG tablet Commonly known as:  VALTREX Take two tablets as a single dose prn cold sore. What changed:  how much to take  how to take this  when to take this  reasons to take this  additional instructions  Diagnostic Studies: Dg Lumbar Spine Complete  Result Date: 12/06/2016 CLINICAL DATA:  L4-5 TLIF with L3-4 and L5-S1 decompression EXAM: LUMBAR SPINE - COMPLETE 4+ VIEW; DG C-ARM 61-120 MIN FLUOROSCOPY TIME:  85 seconds COMPARISON:  MRI lumbar spine dated 09/25/2016 FINDINGS: Intraoperative fluoroscopic radiographs demonstrating L4-5 PLIF, in satisfactory position. IMPRESSION: Intraoperative fluoroscopic radiographs during L4-5 PLIF, as above. Electronically Signed   By: Julian Hy M.D.   On: 12/06/2016 14:32   Dg C-arm 1-60 Min  Result Date: 12/06/2016 CLINICAL DATA:  L4-5 TLIF with L3-4 and L5-S1 decompression EXAM: LUMBAR SPINE - COMPLETE 4+ VIEW; DG C-ARM 61-120 MIN FLUOROSCOPY TIME:  85 seconds COMPARISON:  MRI lumbar spine dated 09/25/2016 FINDINGS: Intraoperative fluoroscopic radiographs demonstrating L4-5 PLIF, in satisfactory position. IMPRESSION: Intraoperative fluoroscopic radiographs during L4-5 PLIF, as above. Electronically Signed   By: Julian Hy M.D.   On: 12/06/2016 14:32    Disposition: 01-Home or Self Care  Discharge Instructions    Call MD / Call 911     Complete by:  As directed    If you experience chest pain or shortness of breath, CALL 911 and be transported to the hospital emergency room.  If you develope a fever above 101 F, pus (white drainage) or increased drainage or redness at the wound, or calf pain, call your surgeon's office.   Constipation Prevention    Complete by:  As directed    Drink plenty of fluids.  Prune juice may be helpful.  You may use a stool softener, such as Colace (over the counter) 100 mg twice a day.  Use MiraLax (over the counter) for constipation as needed.   Diet - low sodium heart healthy    Complete by:  As directed    Discharge instructions    Complete by:  As directed    Call if there is increasing drainage, fever greater than 101.5, severe head aches, and worsening nausea or light sensitivity. If shortness of breath, bloody cough or chest tightness or pain go to an emergency room. No lifting greater than 10 lbs. Avoid bending, stooping and twisting. Use brace when sitting and out of bed even to go to bathroom. Walk in house for first 2 weeks then may start to get out slowly increasing distances up to one quarter mile by 4-6 weeks post op. After 5 days may shower and change dressing following bathing with shower.When bathing remove the brace shower and replace brace before getting out of the shower. If drainage, keep dry dressing and do not bathe the incision, use an moisture impervious dressing. Please call and return for scheduled follow up appointment 2 weeks from the time of surgery.   Driving restrictions    Complete by:  As directed    No driving for 3 weeks and no narcotics while driving.   Increase activity slowly as tolerated    Complete by:  As directed    Lifting restrictions    Complete by:  As directed    No lifting for 12 weeks      Follow-up Information    [name], MD Follow up in 2 week(s).   Specialty:  Orthopedic Surgery Why:  For wound re-check Contact  information: [place] [address] [phone]        [facility] Follow up.   Why:  A representative from [facility] will contact you to arrange start date and time for therapy. Contact information: [address]  [place] [address] [phone]        BURNS, CYNTHIA A., MD. Schedule an appointment as soon as possible for a visit in 2 week(s).   Specialty:  Internal Medicine Contact information: [place] [address] [phone]        Tivis Ringer, MD. Schedule an appointment as soon as possible for a visit in 1 week(s).   Specialty:  Internal Medicine Contact information: [address] [place] [address] [phone]            Signed: [name] 12/10/2016, 5:01 PM

## 2016-12-10 NOTE — Progress Notes (Signed)
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   OT comments  Pt progressing toward OT goals. She was able to complete ambulating toilet transfer to [facility] over toilet with min guard assist and stand to complete grooming at sink with supervision. Pt demonstrates improved ability to complete LB dressing with min assist this session. Pt's significant other present and engaged in session and demonstrates ability to provide all necessary assistance. Educated pt/family concerning brace wear and method to wash and exchange padding of brace when necessary. They verbalize understanding. OT will continue to follow while admitted in preparation for D/C home with home health OT services and assistance from family.   Follow Up Recommendations  Home health OT    Equipment Recommendations  3 in 1 bedside commode    Recommendations for Other Services      Precautions / Restrictions Precautions Precautions: Back Precaution Comments: Reviewed back precautions during ADL. Pt able to recall 2/3 and third with context clues. Required Braces or Orthoses: Spinal Brace Spinal Brace:  Applied in sitting position;Thoracolumbosacral orthotic Restrictions Weight Bearing Restrictions: No       Mobility Bed Mobility Overal bed mobility: Needs Assistance Bed Mobility: Rolling;Sidelying to Sit Rolling: Min guard Sidelying to sit: Min guard       General bed mobility comments: Min guard assist for safety. Good demonstration of technique.  Transfers Overall transfer level: Needs assistance Equipment used: Rolling walker (2 wheeled) Transfers: Sit to/from Stand Sit to Stand: Min guard         General transfer comment: Min guard assist for safety.    Balance Overall balance assessment: Needs assistance Sitting-balance support: No upper extremity supported;Feet supported;Bilateral upper extremity supported Sitting balance-Leahy Scale: Fair     Standing balance support: Bilateral upper extremity supported;Single extremity supported;During functional activity Standing balance-Leahy Scale: Poor Standing balance comment: Quivering of B UE/LE and decreased coordination with movement. Requires UE support during standing ADL tasks.                   ADL Overall ADL's : Needs assistance/impaired     Grooming: Wash/dry hands;Supervision/safety;Standing               Lower Body Dressing: Minimal assistance;Sit to/from stand Lower Body Dressing Details (indicate cue type and reason): Able to cross legs to thread feet into LB clothing Toilet Transfer: Min guard;+2 for safety/equipment;Ambulation;BSC (BSC over toilet)   Toileting- Clothing Manipulation and Hygiene: Min guard;Sit to/from stand       Functional mobility during ADLs: Min guard;Rolling walker General ADL Comments: Difficulty coordinating use of RW but pt able to complete standing ADL with min guard assist.      Vision                     Perception     Praxis  Cognition   Behavior During Therapy: WFL for tasks assessed/performed Overall Cognitive Status: Within  Functional Limits for tasks assessed                         Exercises     Shoulder Instructions       General Comments      Pertinent Vitals/ Pain       Pain Assessment: Faces Pain Score: 5  Faces Pain Scale: Hurts whole lot Pain Location: Low back Pain Descriptors / Indicators: Constant;Aching Pain Intervention(s): Limited activity within patient's tolerance;Monitored during session;Repositioned;Patient requesting pain meds-RN notified  Home Living                                          Prior Functioning/Environment              Frequency  Min 2X/week        Progress Toward Goals  OT Goals(current goals can now be found in the care plan section)  Progress towards OT goals: Progressing toward goals  Acute Rehab OT Goals Patient Stated Goal: less pain; back to studies OT Goal Formulation: With patient Time For Goal Achievement: 12/21/16 Potential to Achieve Goals: Good ADL Goals Pt Will Transfer to Toilet: with min guard assist;bedside commode;ambulating Additional ADL Goal #1: Pt will don doff brace mod I as precursor to adls Additional ADL Goal #2: Pt will complete bed mobility supervision level   Plan Discharge plan remains appropriate    Co-evaluation                 End of Session Equipment Utilized During Treatment: Rolling walker;Back brace  OT Visit Diagnosis: Unsteadiness on feet (R26.81)   Activity Tolerance Patient tolerated treatment well   Patient Left in bed;with call bell/phone within reach;with family/visitor present   Nurse Communication Mobility status;Precautions        Time: 9373-4287 OT Time Calculation (min): 24 min  Charges: OT General Charges $OT Visit: 1 Procedure OT Treatments $Self Care/Home Management : 23-37 mins  Norman Herrlich, MS OTR/L  Pager: Gilbert A Azaliah Carrero 12/10/2016, 11:30 AM

## 2016-12-11 NOTE — Progress Notes (Signed)
[facility] Consultation Progress Note  Patient: [name] [name]:[id number]   PCP: Tivis Ringer, MD DOB: Feb 03, 1966   DOA: 12/06/2016   DOS: 12/10/2016   Date of Service: the patient was seen and examined on 12/10/2016 Primary service: No att. providers found   Subjective: feeling better pain well controlled, dizziness minimal   Brief hospital course: [name] [name] is an 51 y.o. 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 Post op course complicated by diabetes insipidus and nasal septal defect. Pt also with hx lumbar stenosis with hx several back surgeries (2013, 2014, 2015, 2017), depression. Per pt, she has experienced post op hypotension in the past requiring a 2 month prednisone taper in 2013.   Pt underwent back surgery per Dr Louanne Skye on 12/06/16 dut to multiple recurrent disc herniations and spinal stenosis. Post operatively, patient has developed hypotension with reported SBP in 50s on 3/14. Pt received 100mg  Solucortef last evening and again this am. She has now been started on Medrol dose pack. BP has remained stable since receiving IV steroids. [facility] has been asked to consult to help manage adrenal insufficiency.   Assessment and Plan: 1. Adrenal insufficiency  Hypopituitarism Diabetes insipidus Hypotension in setting of adrenal insufficiency with hx of pituitary adenoma s/p resection 03/2016 -BP seems to have stabilized with IV steroids and current po steroids. No orthostasis.  At present I feel her adrenal insufficiency is adequately treated and would Recommend to continue low dose medrol until seen by primary endocrinology in 2 weeks. Had normal cosyntropin stimulation test in [facility] last December.  2. Dizziness Still problem, no focal deficit, no vertigo Continue meclizine.  3. Constipation  Continue bowel regimen   Bowel regimen: last BM 03/12 Diet: regular diet DVT Prophylaxis:  subcutaneous Heparin  Family Communication: no family was present at bedside, at the time of interview.   Disposition: Pt can be medically discharge at home, no driving until seen by PCP  Recommendation on discharge: Please continue medrol dose pack taper, when on 4 mg daily, will continue until seen by primary endocrinology Dr Quay Burow, pt will schedule an appointment. Continue meclizine PRN TID Avoid driving and operating heavy machinery while still having dizziness Follow up with PCP. Checked AVS and discharge med Rec has been performed.   Other Consultants: none Procedures: RIGHT L4-5 TRANSFORAMINAL LUMBAR INTERBODY FUSION WITH CAGE, PEDICLE SCREWS AND RODS, BILATERAL L3-4 AND L5-S1 PARTIAL HEMILAMINECTOMY Antibiotics: Anti-infectives    Start     Dose/Rate Route Frequency Ordered Stop   12/06/16 2200  clindamycin (CLEOCIN) IVPB 900 mg     900 mg 100 mL/hr over 30 Minutes Intravenous Every 8 hours 12/06/16 2116 12/07/16 1413   12/06/16 1742  valACYclovir (VALTREX) tablet 1,000 mg  Status:  Discontinued    Comments:  Take two tablets as a single dose prn cold sore.     1,000 mg Oral Daily PRN 12/06/16 1742 12/06/16 1745   12/06/16 0734  vancomycin (VANCOCIN) 1-5 GM/200ML-% IVPB    Comments:  Flowers, [place]   : cabinet override      12/06/16 0734 12/06/16 1944   12/06/16 0700  clindamycin (CLEOCIN) IVPB 900 mg     900 mg 100 mL/hr over 30 Minutes Intravenous To [facility] 12/05/16 1149 12/06/16 1406      Objective: Physical Exam: Vitals:   12/09/16 1045 12/09/16 1459 12/09/16 2100 12/10/16 0700  BP: 127/69 (!) 114/58 115/73 109/67  Pulse: 89 81 74 72  Resp: 16  17 18 20   Temp:   98.6 F (37 C) 98.2 F (36.8 C)  TempSrc:   Oral Oral  SpO2: 99% 99% 100% 98%   No intake or output data in the 24 hours ending 12/11/16 1755 There were no vitals filed for this visit.  General: Alert, Awake and Oriented to Time, Place and Person. Appear in no distress, affect  appropriate Eyes: PERRL, Conjunctiva normal ENT: Oral Mucosa clear moist. Neck: no JVD, no Abnormal Mass Or lumps Cardiovascular: S1 and S2 Present, no Murmur, Peripheral Pulses Present Respiratory: Bilateral Air entry equal and Decreased, no use of accessory muscle, Clear to Auscultation, no Crackles, no wheezes Abdomen: Bowel Sound present, Soft and no tenderness Skin: redness no, no Rash, no induration Extremities: no Pedal edema, no calf tenderness Neurologic: Grossly no focal neuro deficit. Bilaterally Equal motor strength  Data Reviewed: CBC:  Recent Labs Lab 12/07/16 0644 12/08/16 0608  WBC 14.2* 10.8*  NEUTROABS 12.5*  --   HGB 11.4* 11.0*  HCT 35.1* 34.0*  MCV 94.9 98.0  PLT 198 902   Basic Metabolic Panel:  Recent Labs Lab 12/07/16 0644 12/08/16 0608  NA 137 143  K 3.8 4.0  CL 105 112*  CO2 24 27  GLUCOSE 139* 104*  BUN 9 11  CREATININE 0.56 0.70  CALCIUM 8.7* 8.5*   Liver Function Tests: No results for input(s): AST, ALT, ALKPHOS, BILITOT, PROT, ALBUMIN in the last 168 hours. No results for input(s): LIPASE, AMYLASE in the last 168 hours. No results for input(s): AMMONIA in the last 168 hours. Cardiac Enzymes: No results for input(s): CKTOTAL, CKMB, CKMBINDEX, TROPONINI in the last 168 hours. BNP (last 3 results) No results for input(s): BNP in the last 8760 hours. CBG:  Recent Labs Lab 12/06/16 2320 12/07/16 0725 12/07/16 1214 12/07/16 1617  GLUCAP 144* 135* 125* 126*   Recent Results (from the past 240 hour(s))  Surgical pcr screen     Status: None   Collection Time: 12/02/16  9:01 AM  Result Value Ref Range Status   MRSA, PCR NEGATIVE NEGATIVE Final   Staphylococcus aureus NEGATIVE NEGATIVE Final    Comment:        The Xpert SA Assay (FDA approved for NASAL specimens in patients over 14 years of age), is one component of a comprehensive surveillance program.  Test performance has been validated by [facility] for patients  greater than or equal to 59 year old. It is not intended to diagnose infection nor to guide or monitor treatment.     Studies: No results found.   Time spent: 30 minutes  Author: Berle Mull, MD [facility] Pager: [phone] 12/10/2016   If 7PM-7AM, please contact night-coverage at [email or web address], password [facility]

## 2016-12-12 ENCOUNTER — Inpatient Hospital Stay (INDEPENDENT_AMBULATORY_CARE_PROVIDER_SITE_OTHER): Payer: BLUE CROSS/BLUE SHIELD | Admitting: Specialist

## 2016-12-14 ENCOUNTER — Ambulatory Visit (INDEPENDENT_AMBULATORY_CARE_PROVIDER_SITE_OTHER): Payer: BLUE CROSS/BLUE SHIELD | Admitting: Specialist

## 2016-12-21 ENCOUNTER — Ambulatory Visit (INDEPENDENT_AMBULATORY_CARE_PROVIDER_SITE_OTHER): Payer: BLUE CROSS/BLUE SHIELD | Admitting: Specialist

## 2016-12-21 ENCOUNTER — Encounter (INDEPENDENT_AMBULATORY_CARE_PROVIDER_SITE_OTHER): Payer: Self-pay | Admitting: Specialist

## 2016-12-21 ENCOUNTER — Ambulatory Visit (INDEPENDENT_AMBULATORY_CARE_PROVIDER_SITE_OTHER): Payer: BLUE CROSS/BLUE SHIELD

## 2016-12-21 VITALS — BP 121/75 | HR 88 | Ht 69.0 in | Wt 170.0 lb

## 2016-12-21 DIAGNOSIS — Z981 Arthrodesis status: Secondary | ICD-10-CM

## 2016-12-21 MED ORDER — OXYCODONE HCL ER 30 MG PO T12A: 20.0000 mg | EXTENDED_RELEASE_TABLET | Freq: Two times a day (BID) | ORAL | 0 refills | Status: DC

## 2016-12-21 MED ORDER — OXYCODONE HCL 5 MG PO TABS: 5.0000 mg | ORAL_TABLET | ORAL | 0 refills | Status: DC | PRN

## 2016-12-21 NOTE — Patient Instructions (Signed)
    Call if there is increasing drainage, fever greater than 101.5, severe head aches, and worsening nausea or light sensitivity. If shortness of breath, bloody cough or chest tightness or pain go to an emergency room. No lifting greater than 10 lbs. Avoid bending, stooping and twisting. Use brace when sitting and out of bed even to go to bathroom. Walk in house for first 2 weeks then may start to get out slowly increasing distances up to one mile by 4-6 weeks post op. May shower and change dressing following bathing with shower.When bathing remove the brace shower and replace brace before getting out of the shower. If drainage, keep dry dressing and do not bathe the incision, use an moisture impervious dressing. Please call and return for scheduled follow up appointment 2 weeks from the time of surgery.

## 2016-12-21 NOTE — Progress Notes (Signed)
Post-Op Visit Note   Patient: [name] [name]           Date of Birth: Mar 13, 1966           MRN: [id number] Visit Date: 12/21/2016 PCP: Tivis Ringer, MD   Assessment & Plan:  Chief Complaint:  [name] is 2 weeks postop from Right L4-5 TLIF with cage, pedicle screws and rods, and bilateral L3-4 and L5-S1 partial hemilaminectomy.  She states that she is doing great and she feels good.  She states that she is taking her pain medications and that she able to go little longer between taking them.    Visit Diagnoses:  1. S/P lumbar fusion     Plan: Call if there is increasing drainage, fever greater than 101.5, severe head aches, and worsening nausea or light sensitivity. If shortness of breath, bloody cough or chest tightness or pain go to an emergency room. No lifting greater than 10 lbs. Avoid bending, stooping and twisting. Use brace when sitting and out of bed even to go to bathroom. Walk in house for first 2 weeks then may start to get out slowly increasing distances up to one mile by 4-6 weeks post op. May shower and change dressing following bathing with shower.When bathing remove the brace shower and replace brace before getting out of the shower. If drainage, keep dry dressing and do not bathe the incision, use an moisture impervious dressing. Please call and return for scheduled follow up appointment 2 weeks from the time of surgery.    Follow-Up Instructions: Return in about 4 weeks (around 01/18/2017).   Orders:  Orders Placed This Encounter  Procedures  . XR Lumbar Spine 2-3 Views   Meds ordered this encounter  Medications  . oxyCODONE 30 MG 12 hr tablet    Sig: Take 20 mg by mouth every 12 (twelve) hours.    Dispense:  28 each    Refill:  0  . oxyCODONE (OXY IR/ROXICODONE) 5 MG immediate release tablet    Sig: Take 1-2 tablets (5-10 mg total) by mouth every 3 (three) hours as needed for breakthrough pain.    Dispense:  50 tablet    Refill:  0     Imaging: No results found.  PMFS History: Patient Active Problem List   Diagnosis Date Noted  . Spinal stenosis of lumbar region 12/08/2016    Priority: High    Class: Chronic  . Herniated intervertebral disc of lumbar spine 12/06/2016    Priority: High    Class: Acute  . Spinal stenosis of lumbar region with neurogenic claudication 03/19/2012    Priority: High    Class: Acute  . Hypokalemia 10/21/2015    Priority: Medium  . Spinal stenosis in cervical region 10/21/2015    Priority: Medium    Class: Chronic  . Pituitary mass ([place]) 10/21/2015    Priority: Medium  . Herniated nucleus pulposus, lumbar 07/25/2012    Priority: Medium    Class: Acute  . HNP (herniated nucleus pulposus), lumbar 03/19/2012    Priority: Medium    Class: Acute  . Adrenal insufficiency ([place]) 12/08/2016  . History of pituitary adenoma 12/08/2016  . Recurrent herniation of lumbar disc 12/06/2016  . Trochanteric bursitis, right hip 09/08/2016  . S/P lumbar discectomy 10/16/2015  . History of lumbar laminectomy for spinal cord decompression 09/23/2014  . Herniated nucleus pulposus, L4-5 02/25/2014   Past Medical History:  Diagnosis Date  . Adrenal gland hypofunction ([facility])    related to post  op, vancomycin & lumbar injection, steroids    . Arthritis    hnp-lumbar   . Blood dyscrasia    hx probable blood clot after ankle surgery  not definitive but treated per pt  . Cancer ([place])    "early evolvng melanoma "- legs   . Depression   . Diabetes insipidus ([place])   . Family history of adverse reaction to anesthesia    mom has N/V - zofran works  . Heart murmur    years ago-echo 05 no problems  . High cholesterol   . History of blood transfusion    "16 so far" (07/25/2012)  . History of bronchitis   . History of UTI   . Hypertension    no med for 1 yr  . 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 Migraines   . Peripheral vascular disease ([place])    ? dvt 2011 tx as preventive although did not show  on scan after ankle surgery  . Pneumonia ~ 2009; 2010  . PONV (postoperative nausea and vomiting)    last 2 surgeries less nausea  . PONV (postoperative nausea and vomiting)    none with last surgery-be extra careful with nose patient haas septal hole     Family History  Problem Relation Age of Onset  . Hypertension Mother   . Coronary artery disease Mother   . Hypertension Father     Past Surgical History:  Procedure Laterality Date  . APPENDECTOMY     denies  . BLADDER SUSPENSION  2006  . BREAST BIOPSY  ~ 2010   left breast, benign   . BREAST LUMPECTOMY  ~ 2010   left  . CHOLECYSTECTOMY  2004  . COLONOSCOPY    . DIAGNOSTIC LAPAROSCOPY     2 cysts removed fr. R ovary   . LAMINECTOMY AND MICRODISCECTOMY LUMBAR SPINE  07/25/2012   redo right L4-5 microdiscectomy  . LUMBAR FUSION  12/06/2016   L4 L 5  . LUMBAR LAMINECTOMY  03/19/2012   Procedure: MICRODISCECTOMY LUMBAR LAMINECTOMY;  Surgeon: [name], MD;  Location: [place];  Service: Orthopedics;  Laterality: Right;  MIS Right L3-4 lateral recess decompression and Right L4-5 Microdiscectomy  . LUMBAR LAMINECTOMY  07/25/2012   Procedure: MICRODISCECTOMY LUMBAR LAMINECTOMY;  Surgeon: [name], MD;  Location: [place];  Service: Orthopedics;  Laterality: N/A;  Re-do Right L4-5 Microdiscectomy  . LUMBAR LAMINECTOMY N/A 02/25/2014   Procedure: Bilateral L4-5 MICRODISCECTOMY with MIS;  Surgeon: [name], MD;  Location: [place];  Service: Orthopedics;  Laterality: N/A;  . LUMBAR LAMINECTOMY N/A 09/23/2014   Procedure: RIGHT L4-5 AND L5-S1 MICRODISCECTOMIES;  Surgeon: [name], MD;  Location: [place];  Service: Orthopedics;  Laterality: N/A;  . LUMBAR LAMINECTOMY N/A 10/16/2015   Procedure: REDO MICRODISCECTOMY L4-5;  Surgeon: [name], MD;  Location: [place];  Service: Orthopedics;  Laterality: N/A;  . MELANOMA EXCISION  06/2011   right lower leg; "early evolving"  . REPAIR PERONEAL TENDONS ANKLE  2010   right  .  SHOULDER ARTHROSCOPY  ~ 2009   "frozen shoulder manipulation; right" (07/25/2012)  . TONSILLECTOMY  1977  . TRANSPHENOIDAL PITUITARY RESECTION  04/19/17   at [place]   have Ovaries   Social History   Occupational History  . Not on file.   Social History Main Topics  . Smoking status: Never Smoker  . Smokeless tobacco: Never Used  . Alcohol use 1.2 oz/week  2 Glasses of wine per week     Comment: occasional  . Drug use: No  . Sexual activity: Yes

## 2016-12-27 ENCOUNTER — Telehealth (INDEPENDENT_AMBULATORY_CARE_PROVIDER_SITE_OTHER): Payer: Self-pay | Admitting: Radiology

## 2016-12-27 ENCOUNTER — Other Ambulatory Visit (INDEPENDENT_AMBULATORY_CARE_PROVIDER_SITE_OTHER): Payer: Self-pay | Admitting: Specialist

## 2016-12-27 DIAGNOSIS — Z981 Arthrodesis status: Secondary | ICD-10-CM

## 2016-12-27 MED ORDER — OXYCODONE HCL 5 MG PO TABS: 5.0000 mg | ORAL_TABLET | ORAL | 0 refills | Status: DC | PRN

## 2016-12-27 MED ORDER — OXYCODONE HCL ER 30 MG PO T12A: 30.0000 mg | EXTENDED_RELEASE_TABLET | Freq: Two times a day (BID) | ORAL | 0 refills | Status: AC

## 2016-12-27 MED ORDER — OXYCODONE HCL ER 30 MG PO T12A: 30.0000 mg | EXTENDED_RELEASE_TABLET | Freq: Two times a day (BID) | ORAL | 0 refills | Status: DC

## 2016-12-27 NOTE — Telephone Encounter (Signed)
Rx only for oxycontin 30 mg SR #28, she reports that with this she does not require the IR. The rx given last week can not be prescribed due to the discrepancy between 20 mg in the sig and the written strength which is 30mg , pharmacy has told patient that 20 mg tablet is not available for oxycodone. [facility] website reviewed, no other prescribers noted. This Medication if appropriate in the treatment of pain associated with an extensive lumbar surgery in the face of chronic opiod use pre op. jen

## 2016-12-27 NOTE — Telephone Encounter (Signed)
Pt picked up rx

## 2016-12-27 NOTE — Telephone Encounter (Signed)
Patient states that she was out walking yesterday on some brick and she fell.  She states that the pain feels little different but she states that she is not sure if its because she is just freaking out or what-- But today she is just resting. ------- She also states that she took her rx to the pharmacy to get it filled and they would not fill it because it was written for oxycodone 30mg  but in directions it said to take 2 mg by mouth every 12hrs ... So she will need a new rx for this also.   She states that she is not sure if she has injured the surgery site or not.  I did advise that I would speak with Dr. Louanne Skye and let her know what we need to do, if she needs to be seen or what.

## 2016-12-27 NOTE — Progress Notes (Signed)
Pt picked up rx

## 2017-01-03 ENCOUNTER — Other Ambulatory Visit: Payer: Self-pay | Admitting: Physician Assistant

## 2017-01-03 DIAGNOSIS — D229 Melanocytic nevi, unspecified: Secondary | ICD-10-CM | POA: Diagnosis not present

## 2017-01-03 DIAGNOSIS — D492 Neoplasm of unspecified behavior of bone, soft tissue, and skin: Secondary | ICD-10-CM | POA: Diagnosis not present

## 2017-01-03 DIAGNOSIS — Z85828 Personal history of other malignant neoplasm of skin: Secondary | ICD-10-CM | POA: Diagnosis not present

## 2017-01-06 DIAGNOSIS — E232 Diabetes insipidus: Secondary | ICD-10-CM | POA: Diagnosis not present

## 2017-01-09 DIAGNOSIS — E232 Diabetes insipidus: Secondary | ICD-10-CM | POA: Diagnosis not present

## 2017-01-17 DIAGNOSIS — F4323 Adjustment disorder with mixed anxiety and depressed mood: Secondary | ICD-10-CM | POA: Diagnosis not present

## 2017-01-20 ENCOUNTER — Other Ambulatory Visit (INDEPENDENT_AMBULATORY_CARE_PROVIDER_SITE_OTHER): Payer: Self-pay | Admitting: Specialist

## 2017-01-20 NOTE — Addendum Note (Signed)
Addended by: Minda Ditto, Alyse Low N on: 01/20/2017 01:08 PM   Modules accepted: Orders

## 2017-01-20 NOTE — Telephone Encounter (Signed)
Patient called needing a refill on pain medications. Either oxycontin or the other pain medication Dr. Louanne Skye prescribed. CB # [phone]

## 2017-01-24 MED ORDER — OXYCODONE-ACETAMINOPHEN 5-325 MG PO TABS: 1.0000 | ORAL_TABLET | Freq: Four times a day (QID) | ORAL | 0 refills | Status: DC | PRN

## 2017-01-25 ENCOUNTER — Telehealth (INDEPENDENT_AMBULATORY_CARE_PROVIDER_SITE_OTHER): Payer: Self-pay | Admitting: Radiology

## 2017-01-25 NOTE — Telephone Encounter (Signed)
I called [name] [name] to advise that her rx was ready for pick up, she asked if she could start outpatient physical therapy as soon as possible.  She states that her and Octavia Bruckner are going to [place] for her 51th [place] and she would like to get some visits to therapy in before she goes.  They are going toward the end of May.  She does have a follow up appt on 02/02/2017.---please advise

## 2017-01-25 NOTE — Telephone Encounter (Signed)
Pt aware it is ready for pick up 

## 2017-01-25 NOTE — Telephone Encounter (Signed)
Walking is her best exercise, unfortunately therapy is not as useful till the brace is off, her bending a lifting is still restricted.

## 2017-01-26 NOTE — Telephone Encounter (Signed)
I called patient and advised her of message below

## 2017-02-02 ENCOUNTER — Encounter (INDEPENDENT_AMBULATORY_CARE_PROVIDER_SITE_OTHER): Payer: Self-pay | Admitting: Specialist

## 2017-02-02 ENCOUNTER — Ambulatory Visit (INDEPENDENT_AMBULATORY_CARE_PROVIDER_SITE_OTHER): Payer: BLUE CROSS/BLUE SHIELD

## 2017-02-02 ENCOUNTER — Ambulatory Visit (INDEPENDENT_AMBULATORY_CARE_PROVIDER_SITE_OTHER): Payer: BLUE CROSS/BLUE SHIELD | Admitting: Specialist

## 2017-02-02 VITALS — BP 106/69 | HR 86 | Ht 69.0 in | Wt 170.0 lb

## 2017-02-02 DIAGNOSIS — Z981 Arthrodesis status: Secondary | ICD-10-CM

## 2017-02-02 NOTE — Patient Instructions (Signed)
Avoid frequent bending and stooping  No lifting greater than 10 lbs. May use ice or moist heat for pain. Weight loss is of benefit.   

## 2017-02-02 NOTE — Progress Notes (Signed)
Post-Op Visit Note   Patient: [name] [name]           Date of Birth: 1966-08-16           MRN: [id number] Visit Date: 02/02/2017 PCP: Prince Solian, MD   Assessment & Plan: 9 weeks following L4-5 TLIF and bilateral L3-4 and L5-S1 partial hemilaminectomies. She is neurologically improved   Chief Complaint:  Chief Complaint  Patient presents with  . Lower Back - Routine Post Op   Visit Diagnoses:  1. S/P lumbar fusion     Plan: Avoid frequent bending and stooping  No lifting greater than 10 lbs. May use ice or moist heat for pain. Weight loss is of benefit.   Follow-Up Instructions: No Follow-up on file.   Orders:  Orders Placed This Encounter  Procedures  . XR Lumbar Spine 2-3 Views   No orders of the defined types were placed in this encounter.   Imaging: Xr Lumbar Spine 2-3 Views  Result Date: 02/02/2017 AP and Lateral radiographs of the lumbar spine show the the pedicle screws and rods and cage markers with interbody graft in good position and alignment. No abnormality noted.   PMFS History: Patient Active Problem List   Diagnosis Date Noted  . Spinal stenosis of lumbar region 12/08/2016    Priority: High    Class: Chronic  . Herniated intervertebral disc of lumbar spine 12/06/2016    Priority: High    Class: Acute  . Spinal stenosis of lumbar region with neurogenic claudication 03/19/2012    Priority: High    Class: Acute  . Hypokalemia 10/21/2015    Priority: Medium  . Spinal stenosis in cervical region 10/21/2015    Priority: Medium    Class: Chronic  . Pituitary mass ([place]) 10/21/2015    Priority: Medium  . Herniated nucleus pulposus, lumbar 07/25/2012    Priority: Medium    Class: Acute  . HNP (herniated nucleus pulposus), lumbar 03/19/2012    Priority: Medium    Class: Acute  . Adrenal insufficiency ([place]) 12/08/2016  . History of pituitary adenoma 12/08/2016  . Recurrent herniation of lumbar disc 12/06/2016  . Trochanteric  bursitis, right hip 09/08/2016  . S/P lumbar discectomy 10/16/2015  . History of lumbar laminectomy for spinal cord decompression 09/23/2014  . Herniated nucleus pulposus, L4-5 02/25/2014   Past Medical History:  Diagnosis Date  . 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 Arthritis    hnp-lumbar   . Blood dyscrasia    hx probable blood clot after ankle surgery  not definitive but treated per pt  . Cancer ([place])    "early evolvng melanoma "- legs   . Depression   . Diabetes insipidus ([place])   . Family history of adverse reaction to anesthesia    mom has N/V - zofran works  . Heart murmur    years ago-echo 05 no problems  . High cholesterol   . History of blood transfusion    "16 so far" (07/25/2012)  . History of bronchitis   . History of UTI   . Hypertension    no med for 1 yr  . 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 Migraines   . Peripheral vascular disease ([place])    ? dvt 2011 tx as preventive although did not show on scan after ankle surgery  . Pneumonia ~ 2009; 2010  . PONV (postoperative nausea and vomiting)  last 2 surgeries less nausea  . PONV (postoperative nausea and vomiting)    none with last surgery-be extra careful with nose patient haas septal hole     Family History  Problem Relation Age of Onset  . Hypertension Mother   . Coronary artery disease Mother   . Hypertension Father     Past Surgical History:  Procedure Laterality Date  . APPENDECTOMY     denies  . BLADDER SUSPENSION  2006  . BREAST BIOPSY  ~ 2010   left breast, benign   . BREAST LUMPECTOMY  ~ 2010   left  . CHOLECYSTECTOMY  2004  . COLONOSCOPY    . DIAGNOSTIC LAPAROSCOPY     2 cysts removed fr. R ovary   . LAMINECTOMY AND MICRODISCECTOMY LUMBAR SPINE  07/25/2012   redo right L4-5 microdiscectomy  . LUMBAR FUSION  12/06/2016   L4 L 5  . LUMBAR LAMINECTOMY  03/19/2012   Procedure: MICRODISCECTOMY LUMBAR LAMINECTOMY;  Surgeon:  [name], MD;  Location: [place];  Service: Orthopedics;  Laterality: Right;  MIS Right L3-4 lateral recess decompression and Right L4-5 Microdiscectomy  . LUMBAR LAMINECTOMY  07/25/2012   Procedure: MICRODISCECTOMY LUMBAR LAMINECTOMY;  Surgeon: [name], MD;  Location: [place];  Service: Orthopedics;  Laterality: N/A;  Re-do Right L4-5 Microdiscectomy  . LUMBAR LAMINECTOMY N/A 02/25/2014   Procedure: Bilateral L4-5 MICRODISCECTOMY with MIS;  Surgeon: [name], MD;  Location: [facility];  Service: Orthopedics;  Laterality: N/A;  . LUMBAR LAMINECTOMY N/A 09/23/2014   Procedure: RIGHT L4-5 AND L5-S1 MICRODISCECTOMIES;  Surgeon: [name], MD;  Location: [place];  Service: Orthopedics;  Laterality: N/A;  . LUMBAR LAMINECTOMY N/A 10/16/2015   Procedure: REDO MICRODISCECTOMY L4-5;  Surgeon: [name], MD;  Location: [place];  Service: Orthopedics;  Laterality: N/A;  . MELANOMA EXCISION  06/2011   right lower leg; "early evolving"  . REPAIR PERONEAL TENDONS ANKLE  2010   right  . SHOULDER ARTHROSCOPY  ~ 2009   "frozen shoulder manipulation; right" (07/25/2012)  . TONSILLECTOMY  1977  . TRANSPHENOIDAL PITUITARY RESECTION  04/19/17   at [facility]   have Ovaries   Social History   Occupational History  . Not on file.   Social History Main Topics  . Smoking status: Never Smoker  . Smokeless tobacco: Never Used  . Alcohol use 1.2 oz/week    2 Glasses of wine per week     Comment: occasional  . Drug use: No  . Sexual activity: Yes

## 2017-02-16 ENCOUNTER — Other Ambulatory Visit (INDEPENDENT_AMBULATORY_CARE_PROVIDER_SITE_OTHER): Payer: Self-pay

## 2017-02-16 NOTE — Telephone Encounter (Signed)
Patient would like a Rx refill on Oxycodone.  CB# is [phone].

## 2017-02-16 NOTE — Addendum Note (Signed)
Addended by: Minda Ditto, Alyse Low N on: 02/16/2017 10:50 AM   Modules accepted: Orders

## 2017-02-17 MED ORDER — OXYCODONE-ACETAMINOPHEN 5-325 MG PO TABS: 1.0000 | ORAL_TABLET | Freq: Four times a day (QID) | ORAL | 0 refills | Status: DC | PRN

## 2017-02-17 NOTE — Telephone Encounter (Signed)
Pt is aware rx is ready for pick up. 

## 2017-02-28 ENCOUNTER — Ambulatory Visit (INDEPENDENT_AMBULATORY_CARE_PROVIDER_SITE_OTHER): Payer: Self-pay

## 2017-02-28 ENCOUNTER — Ambulatory Visit (INDEPENDENT_AMBULATORY_CARE_PROVIDER_SITE_OTHER): Payer: BLUE CROSS/BLUE SHIELD | Admitting: Specialist

## 2017-02-28 ENCOUNTER — Encounter (INDEPENDENT_AMBULATORY_CARE_PROVIDER_SITE_OTHER): Payer: Self-pay | Admitting: Specialist

## 2017-02-28 VITALS — BP 104/69 | HR 77 | Ht 69.0 in | Wt 170.0 lb

## 2017-02-28 DIAGNOSIS — Z981 Arthrodesis status: Secondary | ICD-10-CM

## 2017-02-28 NOTE — Progress Notes (Signed)
Post-Op Visit Note   Patient: [name] [name]           Date of Birth: 1966/09/06           MRN: [id number] Visit Date: 02/28/2017 PCP: Prince Solian, MD   Assessment & Plan:3 months post lumbar fusion L4-5  Chief Complaint:  Chief Complaint  Patient presents with  . Lower Back - Routine Post Op    DOS:12/06/2016---12 weeks out today   Visit Diagnoses:  1. S/P lumbar fusion   51 year old female now 12 weeks post op Fusion L4-5 for recurring HNPs and severe DDD with adjacent level decompression. Able to walk upwards of 2.5 miles Was able to walk up to 2.5 to 3 miles then right leg feels week and and goes numb. Very good trip recently to [place] with periods of walking and exercise.   Plan: Avoid frequent bending and stooping  No lifting greater than 10 lbs. May use ice or moist heat for pain. Physical therapy is recommended to assist in core strengthening and weaning from bracce with LE strengthening  Follow-Up Instructions: No Follow-up on file.   Orders:  Orders Placed This Encounter  Procedures  . XR Lumbar Spine 2-3 Views   No orders of the defined types were placed in this encounter.   Imaging: No results found.  PMFS History: Patient Active Problem List   Diagnosis Date Noted  . Spinal stenosis of lumbar region 12/08/2016    Priority: High    Class: Chronic  . Herniated intervertebral disc of lumbar spine 12/06/2016    Priority: High    Class: Acute  . Spinal stenosis of lumbar region with neurogenic claudication 03/19/2012    Priority: High    Class: Acute  . Hypokalemia 10/21/2015    Priority: Medium  . Spinal stenosis in cervical region 10/21/2015    Priority: Medium    Class: Chronic  . Pituitary mass ([place]) 10/21/2015    Priority: Medium  . Herniated nucleus pulposus, lumbar 07/25/2012    Priority: Medium    Class: Acute  . HNP (herniated nucleus pulposus), lumbar 03/19/2012    Priority: Medium    Class: Acute  . Adrenal insufficiency  ([place]) 12/08/2016  . History of pituitary adenoma 12/08/2016  . Recurrent herniation of lumbar disc 12/06/2016  . Trochanteric bursitis, right hip 09/08/2016  . S/P lumbar discectomy 10/16/2015  . History of lumbar laminectomy for spinal cord decompression 09/23/2014  . Herniated nucleus pulposus, L4-5 02/25/2014   Past Medical History:  Diagnosis Date  . 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 Arthritis    hnp-lumbar   . Blood dyscrasia    hx probable blood clot after ankle surgery  not definitive but treated per pt  . Cancer ([place])    "early evolvng melanoma "- legs   . Depression   . Diabetes insipidus ([place])   . Family history of adverse reaction to anesthesia    mom has N/V - zofran works  . Heart murmur    years ago-echo 05 no problems  . High cholesterol   . History of blood transfusion    "16 so far" (07/25/2012)  . History of bronchitis   . History of UTI   . Hypertension    no med for 1 yr  . 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 Migraines   . Peripheral vascular disease ([place])    ? dvt 2011  tx as preventive although did not show on scan after ankle surgery  . Pneumonia ~ 2009; 2010  . PONV (postoperative nausea and vomiting)    last 2 surgeries less nausea  . PONV (postoperative nausea and vomiting)    none with last surgery-be extra careful with nose patient haas septal hole     Family History  Problem Relation Age of Onset  . Hypertension Mother   . Coronary artery disease Mother   . Hypertension Father     Past Surgical History:  Procedure Laterality Date  . APPENDECTOMY     denies  . BLADDER SUSPENSION  2006  . BREAST BIOPSY  ~ 2010   left breast, benign   . BREAST LUMPECTOMY  ~ 2010   left  . CHOLECYSTECTOMY  2004  . COLONOSCOPY    . DIAGNOSTIC LAPAROSCOPY     2 cysts removed fr. R ovary   . LAMINECTOMY AND MICRODISCECTOMY LUMBAR SPINE  07/25/2012   redo right L4-5 microdiscectomy  .  LUMBAR FUSION  12/06/2016   L4 L 5  . LUMBAR LAMINECTOMY  03/19/2012   Procedure: MICRODISCECTOMY LUMBAR LAMINECTOMY;  Surgeon: [name], MD;  Location: [place] [facility];  Service: Orthopedics;  Laterality: Right;  MIS Right L3-4 lateral recess decompression and Right L4-5 Microdiscectomy  . LUMBAR LAMINECTOMY  07/25/2012   Procedure: MICRODISCECTOMY LUMBAR LAMINECTOMY;  Surgeon: [name], MD;  Location: [place];  Service: Orthopedics;  Laterality: N/A;  Re-do Right L4-5 Microdiscectomy  . LUMBAR LAMINECTOMY N/A 02/25/2014   Procedure: Bilateral L4-5 MICRODISCECTOMY with MIS;  Surgeon: [name], MD;  Location: [facility];  Service: Orthopedics;  Laterality: N/A;  . LUMBAR LAMINECTOMY N/A 09/23/2014   Procedure: RIGHT L4-5 AND L5-S1 MICRODISCECTOMIES;  Surgeon: [name], MD;  Location: [facility];  Service: Orthopedics;  Laterality: N/A;  . LUMBAR LAMINECTOMY N/A 10/16/2015   Procedure: REDO MICRODISCECTOMY L4-5;  Surgeon: [name], MD;  Location: [place];  Service: Orthopedics;  Laterality: N/A;  . MELANOMA EXCISION  06/2011   right lower leg; "early evolving"  . REPAIR PERONEAL TENDONS ANKLE  2010   right  . SHOULDER ARTHROSCOPY  ~ 2009   "frozen shoulder manipulation; right" (07/25/2012)  . TONSILLECTOMY  1977  . TRANSPHENOIDAL PITUITARY RESECTION  04/19/17   at [facility]   have Ovaries   Social History   Occupational History  . Not on file.   Social History Main Topics  . Smoking status: Never Smoker  . Smokeless tobacco: Never Used  . Alcohol use 1.2 oz/week    2 Glasses of wine per week     Comment: occasional  . Drug use: No  . Sexual activity: Yes

## 2017-02-28 NOTE — Patient Instructions (Signed)
Avoid frequent bending and stooping  No lifting greater than 10 lbs. May use ice or moist heat for pain. Physical therapy is recommended to assist in core strengthening and weaning from bracce with LE strengthening

## 2017-03-01 ENCOUNTER — Telehealth (INDEPENDENT_AMBULATORY_CARE_PROVIDER_SITE_OTHER): Payer: Self-pay

## 2017-03-01 ENCOUNTER — Ambulatory Visit (INDEPENDENT_AMBULATORY_CARE_PROVIDER_SITE_OTHER): Payer: BLUE CROSS/BLUE SHIELD | Admitting: Specialist

## 2017-03-01 NOTE — Telephone Encounter (Signed)
Jasmin from [facility] called to inform us patient received fax and wanted to let us know patient has appt post op  Check PT  June 14th @ 8:45am with Fritz Pickerel.  CB [phone]

## 2017-03-01 NOTE — Telephone Encounter (Signed)
Jasmin from [facility] called to inform us patient received fax and wanted to let us know patient has appt post op  Check PT  June 14th @ 8:45am with Fritz Pickerel.  CB [phone]

## 2017-03-06 ENCOUNTER — Telehealth (INDEPENDENT_AMBULATORY_CARE_PROVIDER_SITE_OTHER): Payer: Self-pay | Admitting: Radiology

## 2017-03-06 ENCOUNTER — Other Ambulatory Visit (INDEPENDENT_AMBULATORY_CARE_PROVIDER_SITE_OTHER): Payer: Self-pay | Admitting: Specialist

## 2017-03-06 DIAGNOSIS — M79604 Pain in right leg: Secondary | ICD-10-CM

## 2017-03-06 NOTE — Telephone Encounter (Signed)
Patient states she is 12 weeks out from lumbar fusion. She started having right leg pain this weekend that has gotten worse. She has swelling, a little warmth, and a hard spot in her right thigh just above her knee. She denies taking any aspirin or blood thinner medications post op. She does state that years ago she had a blood clot after ankle surgery. The ultrasound did not show it, but she was treated for it due to symptoms. Please call patient to advise if she needs to come in for appt or what you would recommend.   [phone]

## 2017-03-06 NOTE — Progress Notes (Unsigned)
Mrs. [name] [name] called with concern of 3 days swelling in the right thigh that is painful. No distal leg or foot swelling. Past history is significant for previous treatment for  A leg DVT by Dr. Barbaraann Share at [facility] years ago. She is also taking estrogen replacements. 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  I recommend that she take an aspirin full strength and use a warm compress to the thigh area. I ordered a duplex venous doppler study of the right leg to assess for a DVT.

## 2017-03-06 NOTE — Telephone Encounter (Signed)
Patient states she is 12 weeks out from lumbar fusion. She started having right leg pain this weekend that has gotten worse. She has swelling, a little warmth, and a hard spot in her right thigh just above her knee. She denies taking any aspirin or blood thinner medications post op. She does state that years ago she had a blood clot after ankle surgery. The ultrasound did not show it, but she was treated for it due to symptoms. Please call patient to advise if she needs to come in for appt or what you would recommend.

## 2017-03-06 NOTE — Telephone Encounter (Signed)
Mrs. [name] [name] called with concern of 3 days swelling in the right thigh that is painful. No distal leg or foot swelling. Past history is significant for previous treatment for  A leg DVT by Dr. Barbaraann Share at [facility] years ago. She is also taking estrogen replacements. 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  I recommend that she take an aspirin full strength and use a warm compress to the thigh area. I ordered a duplex venous doppler study of the right leg to assess for a DVT

## 2017-03-07 ENCOUNTER — Ambulatory Visit (HOSPITAL_COMMUNITY)
Admission: RE | Admit: 2017-03-07 | Discharge: 2017-03-07 | Disposition: A | Discharge status: Home / Self Care | Payer: BLUE CROSS/BLUE SHIELD | Source: Ambulatory Visit | Attending: Vascular Surgery | Admitting: Vascular Surgery

## 2017-03-07 DIAGNOSIS — M79604 Pain in right leg: Secondary | ICD-10-CM | POA: Diagnosis not present

## 2017-03-07 NOTE — Progress Notes (Signed)
U/s sched for 03/07/17 @ 230

## 2017-03-07 NOTE — Telephone Encounter (Signed)
U/s sched for 03/07/17 @230 

## 2017-03-08 DIAGNOSIS — E230 Hypopituitarism: Secondary | ICD-10-CM | POA: Diagnosis not present

## 2017-03-08 DIAGNOSIS — Z79899 Other long term (current) drug therapy: Secondary | ICD-10-CM | POA: Diagnosis not present

## 2017-03-09 DIAGNOSIS — M5442 Lumbago with sciatica, left side: Secondary | ICD-10-CM | POA: Diagnosis not present

## 2017-03-09 DIAGNOSIS — M5417 Radiculopathy, lumbosacral region: Secondary | ICD-10-CM | POA: Diagnosis not present

## 2017-03-09 DIAGNOSIS — R262 Difficulty in walking, not elsewhere classified: Secondary | ICD-10-CM | POA: Diagnosis not present

## 2017-03-09 DIAGNOSIS — R201 Hypoesthesia of skin: Secondary | ICD-10-CM | POA: Diagnosis not present

## 2017-03-14 ENCOUNTER — Other Ambulatory Visit: Payer: Self-pay | Admitting: Nurse Practitioner

## 2017-03-14 DIAGNOSIS — R262 Difficulty in walking, not elsewhere classified: Secondary | ICD-10-CM | POA: Diagnosis not present

## 2017-03-14 DIAGNOSIS — M5417 Radiculopathy, lumbosacral region: Secondary | ICD-10-CM | POA: Diagnosis not present

## 2017-03-14 DIAGNOSIS — Z1231 Encounter for screening mammogram for malignant neoplasm of breast: Secondary | ICD-10-CM

## 2017-03-14 DIAGNOSIS — M5442 Lumbago with sciatica, left side: Secondary | ICD-10-CM | POA: Diagnosis not present

## 2017-03-14 DIAGNOSIS — R201 Hypoesthesia of skin: Secondary | ICD-10-CM | POA: Diagnosis not present

## 2017-03-16 DIAGNOSIS — R262 Difficulty in walking, not elsewhere classified: Secondary | ICD-10-CM | POA: Diagnosis not present

## 2017-03-16 DIAGNOSIS — M5417 Radiculopathy, lumbosacral region: Secondary | ICD-10-CM | POA: Diagnosis not present

## 2017-03-16 DIAGNOSIS — R201 Hypoesthesia of skin: Secondary | ICD-10-CM | POA: Diagnosis not present

## 2017-03-16 DIAGNOSIS — M5442 Lumbago with sciatica, left side: Secondary | ICD-10-CM | POA: Diagnosis not present

## 2017-03-22 DIAGNOSIS — G8929 Other chronic pain: Secondary | ICD-10-CM | POA: Diagnosis not present

## 2017-03-22 DIAGNOSIS — M5441 Lumbago with sciatica, right side: Secondary | ICD-10-CM | POA: Diagnosis not present

## 2017-03-22 DIAGNOSIS — M5442 Lumbago with sciatica, left side: Secondary | ICD-10-CM | POA: Diagnosis not present

## 2017-04-03 ENCOUNTER — Ambulatory Visit
Admission: RE | Admit: 2017-04-03 | Discharge: 2017-04-03 | Disposition: A | Discharge status: Home / Self Care | Payer: BLUE CROSS/BLUE SHIELD | Source: Ambulatory Visit | Attending: Nurse Practitioner | Admitting: Nurse Practitioner

## 2017-04-03 DIAGNOSIS — Z1231 Encounter for screening mammogram for malignant neoplasm of breast: Secondary | ICD-10-CM | POA: Diagnosis not present

## 2017-04-04 DIAGNOSIS — M5442 Lumbago with sciatica, left side: Secondary | ICD-10-CM | POA: Diagnosis not present

## 2017-04-04 DIAGNOSIS — R262 Difficulty in walking, not elsewhere classified: Secondary | ICD-10-CM | POA: Diagnosis not present

## 2017-04-04 DIAGNOSIS — R201 Hypoesthesia of skin: Secondary | ICD-10-CM | POA: Diagnosis not present

## 2017-04-04 DIAGNOSIS — M5417 Radiculopathy, lumbosacral region: Secondary | ICD-10-CM | POA: Diagnosis not present

## 2017-04-06 DIAGNOSIS — R201 Hypoesthesia of skin: Secondary | ICD-10-CM | POA: Diagnosis not present

## 2017-04-06 DIAGNOSIS — R262 Difficulty in walking, not elsewhere classified: Secondary | ICD-10-CM | POA: Diagnosis not present

## 2017-04-06 DIAGNOSIS — M5417 Radiculopathy, lumbosacral region: Secondary | ICD-10-CM | POA: Diagnosis not present

## 2017-04-06 DIAGNOSIS — M5442 Lumbago with sciatica, left side: Secondary | ICD-10-CM | POA: Diagnosis not present

## 2017-04-11 DIAGNOSIS — R201 Hypoesthesia of skin: Secondary | ICD-10-CM | POA: Diagnosis not present

## 2017-04-11 DIAGNOSIS — M5442 Lumbago with sciatica, left side: Secondary | ICD-10-CM | POA: Diagnosis not present

## 2017-04-11 DIAGNOSIS — R262 Difficulty in walking, not elsewhere classified: Secondary | ICD-10-CM | POA: Diagnosis not present

## 2017-04-11 DIAGNOSIS — M5417 Radiculopathy, lumbosacral region: Secondary | ICD-10-CM | POA: Diagnosis not present

## 2017-04-12 DIAGNOSIS — F4323 Adjustment disorder with mixed anxiety and depressed mood: Secondary | ICD-10-CM | POA: Diagnosis not present

## 2017-04-18 DIAGNOSIS — M5417 Radiculopathy, lumbosacral region: Secondary | ICD-10-CM | POA: Diagnosis not present

## 2017-04-18 DIAGNOSIS — M5442 Lumbago with sciatica, left side: Secondary | ICD-10-CM | POA: Diagnosis not present

## 2017-04-18 DIAGNOSIS — R201 Hypoesthesia of skin: Secondary | ICD-10-CM | POA: Diagnosis not present

## 2017-04-18 DIAGNOSIS — R262 Difficulty in walking, not elsewhere classified: Secondary | ICD-10-CM | POA: Diagnosis not present

## 2017-04-26 ENCOUNTER — Ambulatory Visit (INDEPENDENT_AMBULATORY_CARE_PROVIDER_SITE_OTHER): Payer: BLUE CROSS/BLUE SHIELD | Admitting: Specialist

## 2017-04-26 ENCOUNTER — Encounter (INDEPENDENT_AMBULATORY_CARE_PROVIDER_SITE_OTHER): Payer: Self-pay | Admitting: Specialist

## 2017-04-26 ENCOUNTER — Ambulatory Visit (INDEPENDENT_AMBULATORY_CARE_PROVIDER_SITE_OTHER): Payer: Self-pay

## 2017-04-26 VITALS — BP 107/71 | HR 70 | Ht 69.0 in | Wt 170.0 lb

## 2017-04-26 DIAGNOSIS — Z981 Arthrodesis status: Secondary | ICD-10-CM

## 2017-04-26 MED ORDER — OXYCODONE HCL 5 MG PO CAPS: 5.0000 mg | ORAL_CAPSULE | ORAL | 0 refills | Status: DC | PRN

## 2017-04-26 MED ORDER — ONDANSETRON HCL 4 MG PO TABS: 4.0000 mg | ORAL_TABLET | Freq: Three times a day (TID) | ORAL | 0 refills | Status: DC | PRN

## 2017-04-26 NOTE — Progress Notes (Signed)
Office Visit Note   Patient: [name] [name]           Date of Birth: 1965-12-13           MRN: [id number] Visit Date: 04/26/2017              Requested by: Prince Solian, MD [address] [place], [place] [address] PCP: Prince Solian, MD   Assessment & Plan: Visit Diagnoses:  1. S/P lumbar fusion     Plan: Avoid frequent bending and stooping  No lifting greater than 10 lbs. May use ice or moist heat for pain. Weight loss is of benefit.   Follow-Up Instructions: Return in about 6 months (around 10/27/2017).   Orders:  Orders Placed This Encounter  Procedures  . XR Lumbar Spine 2-3 Views   No orders of the defined types were placed in this encounter.     Procedures: No procedures performed   Clinical Data: No additional findings.   Subjective: Chief Complaint  Patient presents with  . Lower Back - Follow-up    51 year old female status post L4-5 fusion 11/2016. She is feeling better but doies experience an occasional pain increase. She is standing and walking and is in a stable relationship. Last ACT test showed persistent adrenal dysfunction but there is some minimal function. Bowel and blader function is normal. She is in PT twice a week and is working on balance and coordination.    Review of Systems  Constitutional: Negative.   HENT: Negative.   Eyes: Negative.   Respiratory: Negative.   Cardiovascular: Negative.   Gastrointestinal: Negative.   Endocrine: Negative.   Genitourinary: Negative.   Musculoskeletal: Negative.   Skin: Negative.   Allergic/Immunologic: Negative.   Neurological: Negative.   Hematological: Negative.   Psychiatric/Behavioral: Negative.      Objective: Vital Signs: BP 107/71 (BP Location: Left Arm, Patient Position: Sitting)   Pulse 70   Ht 5\' 9"  (1.753 m)   Wt 170 lb (77.1 kg)   BMI 25.10 kg/m   Physical Exam  Constitutional: She is oriented to person, place, and time. She appears well-developed and  well-nourished.  HENT:  Head: Normocephalic and atraumatic.  Eyes: Pupils are equal, round, and reactive to light. EOM are normal.  Neck: Normal range of motion. Neck supple.  Pulmonary/Chest: Effort normal and breath sounds normal.  Abdominal: Soft. Bowel sounds are normal.  Musculoskeletal: Normal range of motion.  Neurological: She is alert and oriented to person, place, and time.  Skin: Skin is warm and dry.  Psychiatric: She has a normal mood and affect. Her behavior is normal. Judgment and thought content normal.    Ortho Exam  Specialty Comments:  No specialty comments available.  Imaging: Xr Lumbar Spine 2-3 Views  Result Date: 04/26/2017 AP and lateral flexion and extension radiographs of the lumbar spine show 1. 66mm of motion with flexion and extension,  the interbody graft appears to be incorporating. The pedicle screws are intact.    PMFS History: Patient Active Problem List   Diagnosis Date Noted  . Spinal stenosis of lumbar region 12/08/2016    Priority: High    Class: Chronic  . Herniated intervertebral disc of lumbar spine 12/06/2016    Priority: High    Class: Acute  . Spinal stenosis of lumbar region with neurogenic claudication 03/19/2012    Priority: High    Class: Acute  . Hypokalemia 10/21/2015    Priority: Medium  . Spinal stenosis in cervical region 10/21/2015  Priority: Medium    Class: Chronic  . Pituitary mass ([place]) 10/21/2015    Priority: Medium  . Herniated nucleus pulposus, lumbar 07/25/2012    Priority: Medium    Class: Acute  . HNP (herniated nucleus pulposus), lumbar 03/19/2012    Priority: Medium    Class: Acute  . Adrenal insufficiency ([place]) 12/08/2016  . History of pituitary adenoma 12/08/2016  . Recurrent herniation of lumbar disc 12/06/2016  . Trochanteric bursitis, right hip 09/08/2016  . S/P lumbar discectomy 10/16/2015  . History of lumbar laminectomy for spinal cord decompression 09/23/2014  . Herniated nucleus  pulposus, L4-5 02/25/2014   Past Medical History:  Diagnosis Date  . 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 Arthritis    hnp-lumbar   . Blood dyscrasia    hx probable blood clot after ankle surgery  not definitive but treated per pt  . Cancer ([place])    "early evolvng melanoma "- legs   . Depression   . Diabetes insipidus ([place])   . Family history of adverse reaction to anesthesia    mom has N/V - zofran works  . Heart murmur    years ago-echo 05 no problems  . High cholesterol   . History of blood transfusion    "16 so far" (07/25/2012)  . History of bronchitis   . History of UTI   . Hypertension    no med for 1 yr  . 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 Migraines   . Peripheral vascular disease ([place])    ? dvt 2011 tx as preventive although did not show on scan after ankle surgery  . Pneumonia ~ 2009; 2010  . PONV (postoperative nausea and vomiting)    last 2 surgeries less nausea  . PONV (postoperative nausea and vomiting)    none with last surgery-be extra careful with nose patient haas septal hole     Family History  Problem Relation Age of Onset  . Hypertension Mother   . Coronary artery disease Mother   . Hypertension Father     Past Surgical History:  Procedure Laterality Date  . APPENDECTOMY     denies  . BLADDER SUSPENSION  2006  . BREAST BIOPSY  ~ 2010   left breast, benign   . BREAST LUMPECTOMY  ~ 2010   left  . CHOLECYSTECTOMY  2004  . COLONOSCOPY    . DIAGNOSTIC LAPAROSCOPY     2 cysts removed fr. R ovary   . LAMINECTOMY AND MICRODISCECTOMY LUMBAR SPINE  07/25/2012   redo right L4-5 microdiscectomy  . LUMBAR FUSION  12/06/2016   L4 L 5  . LUMBAR LAMINECTOMY  03/19/2012   Procedure: MICRODISCECTOMY LUMBAR LAMINECTOMY;  Surgeon: [name], MD;  Location: [place];  Service: Orthopedics;  Laterality: Right;  MIS Right L3-4 lateral recess decompression and Right L4-5 Microdiscectomy  .  LUMBAR LAMINECTOMY  07/25/2012   Procedure: MICRODISCECTOMY LUMBAR LAMINECTOMY;  Surgeon: [name], MD;  Location: [place];  Service: Orthopedics;  Laterality: N/A;  Re-do Right L4-5 Microdiscectomy  . LUMBAR LAMINECTOMY N/A 02/25/2014   Procedure: Bilateral L4-5 MICRODISCECTOMY with MIS;  Surgeon: [name], MD;  Location: [place];  Service: Orthopedics;  Laterality: N/A;  . LUMBAR LAMINECTOMY N/A 09/23/2014   Procedure: RIGHT L4-5 AND L5-S1 MICRODISCECTOMIES;  Surgeon: [name], MD;  Location: [place];  Service: Orthopedics;  Laterality: N/A;  . LUMBAR LAMINECTOMY N/A  10/16/2015   Procedure: REDO MICRODISCECTOMY L4-5;  Surgeon: [name], MD;  Location: [facility];  Service: Orthopedics;  Laterality: N/A;  . MELANOMA EXCISION  06/2011   right lower leg; "early evolving"  . REPAIR PERONEAL TENDONS ANKLE  2010   right  . SHOULDER ARTHROSCOPY  ~ 2009   "frozen shoulder manipulation; right" (07/25/2012)  . TONSILLECTOMY  1977  . TRANSPHENOIDAL PITUITARY RESECTION  04/19/17   at [place]   have Ovaries   Social History   Occupational History  . Not on file.   Social History Main Topics  . Smoking status: Never Smoker  . Smokeless tobacco: Never Used  . Alcohol use 1.2 oz/week    2 Glasses of wine per week     Comment: occasional  . Drug use: No  . Sexual activity: Yes

## 2017-04-26 NOTE — Patient Instructions (Signed)
Avoid frequent bending and stooping  No lifting greater than 10 lbs. May use ice or moist heat for pain. Weight loss is of benefit.   

## 2017-04-27 DIAGNOSIS — R262 Difficulty in walking, not elsewhere classified: Secondary | ICD-10-CM | POA: Diagnosis not present

## 2017-04-27 DIAGNOSIS — M5442 Lumbago with sciatica, left side: Secondary | ICD-10-CM | POA: Diagnosis not present

## 2017-04-27 DIAGNOSIS — R2689 Other abnormalities of gait and mobility: Secondary | ICD-10-CM | POA: Diagnosis not present

## 2017-04-27 DIAGNOSIS — M5417 Radiculopathy, lumbosacral region: Secondary | ICD-10-CM | POA: Diagnosis not present

## 2017-05-02 DIAGNOSIS — M5441 Lumbago with sciatica, right side: Secondary | ICD-10-CM | POA: Diagnosis not present

## 2017-05-02 DIAGNOSIS — G8929 Other chronic pain: Secondary | ICD-10-CM | POA: Diagnosis not present

## 2017-05-02 DIAGNOSIS — M5442 Lumbago with sciatica, left side: Secondary | ICD-10-CM | POA: Diagnosis not present

## 2017-05-11 DIAGNOSIS — G8929 Other chronic pain: Secondary | ICD-10-CM | POA: Diagnosis not present

## 2017-05-11 DIAGNOSIS — M5441 Lumbago with sciatica, right side: Secondary | ICD-10-CM | POA: Diagnosis not present

## 2017-05-11 DIAGNOSIS — M5442 Lumbago with sciatica, left side: Secondary | ICD-10-CM | POA: Diagnosis not present

## 2017-05-12 DIAGNOSIS — F4323 Adjustment disorder with mixed anxiety and depressed mood: Secondary | ICD-10-CM | POA: Diagnosis not present

## 2017-05-25 DIAGNOSIS — F4323 Adjustment disorder with mixed anxiety and depressed mood: Secondary | ICD-10-CM | POA: Diagnosis not present

## 2017-06-05 DIAGNOSIS — M5442 Lumbago with sciatica, left side: Secondary | ICD-10-CM | POA: Diagnosis not present

## 2017-06-05 DIAGNOSIS — G8929 Other chronic pain: Secondary | ICD-10-CM | POA: Diagnosis not present

## 2017-06-05 DIAGNOSIS — M5441 Lumbago with sciatica, right side: Secondary | ICD-10-CM | POA: Diagnosis not present

## 2017-06-09 DIAGNOSIS — J019 Acute sinusitis, unspecified: Secondary | ICD-10-CM | POA: Diagnosis not present

## 2017-06-09 DIAGNOSIS — R05 Cough: Secondary | ICD-10-CM | POA: Diagnosis not present

## 2017-06-09 DIAGNOSIS — Z6825 Body mass index (BMI) 25.0-25.9, adult: Secondary | ICD-10-CM | POA: Diagnosis not present

## 2017-06-14 DIAGNOSIS — F4323 Adjustment disorder with mixed anxiety and depressed mood: Secondary | ICD-10-CM | POA: Diagnosis not present

## 2017-06-20 DIAGNOSIS — F4323 Adjustment disorder with mixed anxiety and depressed mood: Secondary | ICD-10-CM | POA: Diagnosis not present

## 2017-07-06 DIAGNOSIS — F502 Bulimia nervosa: Secondary | ICD-10-CM | POA: Diagnosis not present

## 2017-07-06 DIAGNOSIS — F4323 Adjustment disorder with mixed anxiety and depressed mood: Secondary | ICD-10-CM | POA: Diagnosis not present

## 2017-07-06 DIAGNOSIS — F3341 Major depressive disorder, recurrent, in partial remission: Secondary | ICD-10-CM | POA: Diagnosis not present

## 2017-07-14 DIAGNOSIS — E232 Diabetes insipidus: Secondary | ICD-10-CM | POA: Diagnosis not present

## 2017-07-14 DIAGNOSIS — Z23 Encounter for immunization: Secondary | ICD-10-CM | POA: Diagnosis not present

## 2017-07-14 DIAGNOSIS — E893 Postprocedural hypopituitarism: Secondary | ICD-10-CM | POA: Diagnosis not present

## 2017-07-14 DIAGNOSIS — Z8639 Personal history of other endocrine, nutritional and metabolic disease: Secondary | ICD-10-CM | POA: Diagnosis not present

## 2017-07-19 DIAGNOSIS — D352 Benign neoplasm of pituitary gland: Secondary | ICD-10-CM | POA: Diagnosis not present

## 2017-07-25 DIAGNOSIS — J40 Bronchitis, not specified as acute or chronic: Secondary | ICD-10-CM | POA: Diagnosis not present

## 2017-07-25 DIAGNOSIS — Z6825 Body mass index (BMI) 25.0-25.9, adult: Secondary | ICD-10-CM | POA: Diagnosis not present

## 2017-07-25 DIAGNOSIS — H6590 Unspecified nonsuppurative otitis media, unspecified ear: Secondary | ICD-10-CM | POA: Diagnosis not present

## 2017-07-25 DIAGNOSIS — R05 Cough: Secondary | ICD-10-CM | POA: Diagnosis not present

## 2017-08-01 DIAGNOSIS — F4323 Adjustment disorder with mixed anxiety and depressed mood: Secondary | ICD-10-CM | POA: Diagnosis not present

## 2017-08-15 DIAGNOSIS — F4323 Adjustment disorder with mixed anxiety and depressed mood: Secondary | ICD-10-CM | POA: Diagnosis not present

## 2017-08-29 ENCOUNTER — Telehealth (INDEPENDENT_AMBULATORY_CARE_PROVIDER_SITE_OTHER): Payer: Self-pay | Admitting: Specialist

## 2017-08-29 NOTE — Telephone Encounter (Signed)
Patient called saying that she has some collapsed discs in her neck and she's noticed her right arm is aching and she's experiencing numbness, her insurance is running out and she's really worried. She would like to know if she could be seen asap. CB # [phone]

## 2017-08-31 DIAGNOSIS — N951 Menopausal and female climacteric states: Secondary | ICD-10-CM | POA: Diagnosis not present

## 2017-09-08 DIAGNOSIS — F4323 Adjustment disorder with mixed anxiety and depressed mood: Secondary | ICD-10-CM | POA: Diagnosis not present

## 2017-09-12 DIAGNOSIS — F4323 Adjustment disorder with mixed anxiety and depressed mood: Secondary | ICD-10-CM | POA: Diagnosis not present

## 2017-09-21 DIAGNOSIS — E230 Hypopituitarism: Secondary | ICD-10-CM | POA: Diagnosis not present

## 2017-10-17 DIAGNOSIS — F4323 Adjustment disorder with mixed anxiety and depressed mood: Secondary | ICD-10-CM | POA: Diagnosis not present

## 2017-10-25 ENCOUNTER — Ambulatory Visit (INDEPENDENT_AMBULATORY_CARE_PROVIDER_SITE_OTHER): Payer: BLUE CROSS/BLUE SHIELD

## 2017-10-25 ENCOUNTER — Ambulatory Visit (INDEPENDENT_AMBULATORY_CARE_PROVIDER_SITE_OTHER): Payer: BLUE CROSS/BLUE SHIELD | Admitting: Specialist

## 2017-10-25 ENCOUNTER — Encounter (INDEPENDENT_AMBULATORY_CARE_PROVIDER_SITE_OTHER): Payer: Self-pay | Admitting: Specialist

## 2017-10-25 VITALS — BP 107/68 | HR 86 | Ht 69.0 in | Wt 170.0 lb

## 2017-10-25 DIAGNOSIS — M4802 Spinal stenosis, cervical region: Secondary | ICD-10-CM

## 2017-10-25 DIAGNOSIS — G5691 Unspecified mononeuropathy of right upper limb: Secondary | ICD-10-CM

## 2017-10-25 DIAGNOSIS — Z981 Arthrodesis status: Secondary | ICD-10-CM

## 2017-10-25 DIAGNOSIS — M792 Neuralgia and neuritis, unspecified: Secondary | ICD-10-CM

## 2017-10-25 NOTE — Patient Instructions (Signed)
Avoid frequent bending and stooping  No lifting greater than 10 lbs. May use ice or moist heat for pain. Weight loss is of benefit. Right forearm pain will be assess with EMG/NCV to assess for cervical origin vs Supinator syndrome.Avoid overhead lifting and overhead use of the arms. Do not lift greater than 5 lbs. Adjust head rest in vehicle to prevent hyperextension if rear ended. Take extra precautions to avoid falling.

## 2017-10-25 NOTE — Progress Notes (Addendum)
Office Visit Note   Patient: [name] [name]           Date of Birth: June 20, 1966           MRN: [id number] Visit Date: 10/25/2017              Requested by: Prince Solian, MD [address] [place], [place] [address] PCP: Prince Solian, MD   Assessment & Plan: Visit Diagnoses:  1. S/P lumbar fusion   2. Spinal stenosis of cervical region   3. Neuropathic pain of right forearm     Plan: Avoid frequent bending and stooping  No lifting greater than 10 lbs. May use ice or moist heat for pain. Weight loss is of benefit. Right forearm pain will be assess with EMG/NCV to assess for cervical origin vs Supinator syndrome.Avoid overhead lifting and overhead use of the arms. Do not lift greater than 5 lbs. Adjust head rest in vehicle to prevent hyperextension if rear ended. Take extra precautions to avoid falling.  Follow-Up Instructions: Return in about 4 weeks (around 11/22/2017).   Orders:  Orders Placed This Encounter  Procedures  . XR Lumbar Spine 2-3 Views   No orders of the defined types were placed in this encounter.     Procedures: No procedures performed   Clinical Data: No additional findings.   Subjective: Chief Complaint  Patient presents with  . Lower Back - Follow-up    52 year old female with history of LBP with 6 surgeries the last 11/2016 an L4-5 decompression and TLIF. She has intermittant pain that is aching into the back especially with  Weather changes. Has right leg chronic weakness but is exercising. She is working out daily, awakening at 5 AM. Has right arm pain that radiates from the right elbow into the right dorsal wrist. Neck stiffness, history of pitutitary adenoma/tumor resection, also some cervical stenosis C5-6 and C6-7,now without CSF leak. The pain is aching and tingling.     Review of Systems  Constitutional: Negative.   HENT: Negative.   Eyes: Negative.   Respiratory: Negative.   Cardiovascular: Negative.     Gastrointestinal: Negative.   Endocrine: Negative.   Genitourinary: Negative.   Musculoskeletal: Negative.   Skin: Negative.   Allergic/Immunologic: Negative.   Neurological: Negative.   Hematological: Negative.   Psychiatric/Behavioral: Negative.      Objective: Vital Signs: BP 107/68 (BP Location: Left Arm, Patient Position: Sitting)   Pulse 86   Ht 5\' 9"  (1.753 m)   Wt 170 lb (77.1 kg)   BMI 25.10 kg/m   Physical Exam  Constitutional: She is oriented to person, place, and time. She appears well-developed and well-nourished.  HENT:  Head: Normocephalic and atraumatic.  Eyes: EOM are normal. Pupils are equal, round, and reactive to light.  Neck: Normal range of motion. Neck supple.  Pulmonary/Chest: Effort normal and breath sounds normal.  Abdominal: Soft. Bowel sounds are normal.  Neurological: She is alert and oriented to person, place, and time.  Skin: Skin is warm and dry.  Psychiatric: She has a normal mood and affect. Her behavior is normal. Judgment and thought content normal.    Back Exam   Tenderness  The patient is experiencing tenderness in the lumbar.  Range of Motion  Extension: abnormal  Flexion: abnormal  Lateral bend right: normal  Lateral bend left: normal  Rotation right: normal  Rotation left: normal   Muscle Strength  Right Quadriceps:  5/5  Left Quadriceps:  5/5  Right Hamstrings:  5/5  Left Hamstrings:  5/5   Tests  Straight leg raise right: negative Straight leg raise left: negative  Reflexes  Patellar: normal Achilles: abnormal Biceps: Hyperreflexic Babinski's sign: normal   Other  Toe walk: normal Heel walk: abnormal Sensation: decreased Gait: drop-foot  Erythema: no back redness Scars: absent  Comments:  Right foot DF is 4/5 but better than preoperatively. Cervical ROM is decreased Extension by 40% and lateral bending and flexion right greater than left 60% more than left 40%   Right Elbow Exam   Tenderness  The  patient is experiencing tenderness in the radial head.   Range of Motion  Extension: normal  Flexion: normal  Pronation: normal  Supination: normal   Muscle Strength  Pronation:  5/5  Supination:  5/5   Tests  Varus: negative Valgus: negative Tinel's sign (cubital tunnel): negative  Other  Erythema: absent Scars: absent Sensation: normal Pulse: present  Comments:  Tender with pressure 4-6 cm distal to the right lateral epicondyle. Pain radiates into the the right dorsal forearm. Pain with supination and holding the right hand and wrist upwards. Pain with middle finger extension. Rule out supinator syndrome vs cervical radiculopathy in C7 distribution.   Left Elbow Exam  Left elbow exam is normal.      Specialty Comments:  No specialty comments available.  Imaging: Xr Lumbar Spine 2-3 Views  Result Date: 10/25/2017 AP and lateral flexion and extension radiographs show pedicle screws and rods L4-5 with interbody fusion, the fusion appears solid with 74.6mm in flexion and 74.8 mm in extension.    PMFS History: Patient Active Problem List   Diagnosis Date Noted  . Spinal stenosis of lumbar region 12/08/2016    Priority: High    Class: Chronic  . Herniated intervertebral disc of lumbar spine 12/06/2016    Priority: High    Class: Acute  . Spinal stenosis of lumbar region with neurogenic claudication 03/19/2012    Priority: High    Class: Acute  . Hypokalemia 10/21/2015    Priority: Medium  . Spinal stenosis in cervical region 10/21/2015    Priority: Medium    Class: Chronic  . Pituitary mass ([facility]) 10/21/2015    Priority: Medium  . Herniated nucleus pulposus, lumbar 07/25/2012    Priority: Medium    Class: Acute  . HNP (herniated nucleus pulposus), lumbar 03/19/2012    Priority: Medium    Class: Acute  . Adrenal insufficiency ([facility]) 12/08/2016  . History of pituitary adenoma 12/08/2016  . Recurrent herniation of lumbar disc 12/06/2016  . Trochanteric  bursitis, right hip 09/08/2016  . S/P lumbar discectomy 10/16/2015  . History of lumbar laminectomy for spinal cord decompression 09/23/2014  . Herniated nucleus pulposus, L4-5 02/25/2014   Past Medical History:  Diagnosis Date  . 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 Arthritis    hnp-lumbar   . Blood dyscrasia    hx probable blood clot after ankle surgery  not definitive but treated per pt  . Cancer ([facility])    "early evolvng melanoma "- legs   . Depression   . 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 Family history of adverse reaction to anesthesia    mom has N/V - zofran works  . Heart murmur    years ago-echo 05 no problems  . High cholesterol   . History of blood transfusion    "16 so far" (07/25/2012)  . History of bronchitis   .  History of UTI   . Hypertension    no med for 1 yr  . 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 Migraines   . Peripheral vascular disease ([facility])    ? dvt 2011 tx as preventive although did not show on scan after ankle surgery  . Pneumonia ~ 2009; 2010  . PONV (postoperative nausea and vomiting)    last 2 surgeries less nausea  . PONV (postoperative nausea and vomiting)    none with last surgery-be extra careful with nose patient haas septal hole     Family History  Problem Relation Age of Onset  . Hypertension Mother   . Coronary artery disease Mother   . Hypertension Father     Past Surgical History:  Procedure Laterality Date  . APPENDECTOMY     denies  . BLADDER SUSPENSION  2006  . BREAST BIOPSY  ~ 2010   left breast, benign   . BREAST LUMPECTOMY  ~ 2010   left  . CHOLECYSTECTOMY  2004  . COLONOSCOPY    . DIAGNOSTIC LAPAROSCOPY     2 cysts removed fr. R ovary   . LAMINECTOMY AND MICRODISCECTOMY LUMBAR SPINE  07/25/2012   redo right L4-5 microdiscectomy  . LUMBAR FUSION  12/06/2016   L4 L 5  . LUMBAR LAMINECTOMY  03/19/2012   Procedure: MICRODISCECTOMY LUMBAR LAMINECTOMY;  Surgeon:  [name], MD;  Location: [facility];  Service: Orthopedics;  Laterality: Right;  MIS Right L3-4 lateral recess decompression and Right L4-5 Microdiscectomy  . LUMBAR LAMINECTOMY  07/25/2012   Procedure: MICRODISCECTOMY LUMBAR LAMINECTOMY;  Surgeon: [name], MD;  Location: [facility];  Service: Orthopedics;  Laterality: N/A;  Re-do Right L4-5 Microdiscectomy  . LUMBAR LAMINECTOMY N/A 02/25/2014   Procedure: Bilateral L4-5 MICRODISCECTOMY with MIS;  Surgeon: [name], MD;  Location: [facility];  Service: Orthopedics;  Laterality: N/A;  . LUMBAR LAMINECTOMY N/A 09/23/2014   Procedure: RIGHT L4-5 AND L5-S1 MICRODISCECTOMIES;  Surgeon: [name], MD;  Location: [facility];  Service: Orthopedics;  Laterality: N/A;  . LUMBAR LAMINECTOMY N/A 10/16/2015   Procedure: REDO MICRODISCECTOMY L4-5;  Surgeon: [name], MD;  Location: [facility];  Service: Orthopedics;  Laterality: N/A;  . MELANOMA EXCISION  06/2011   right lower leg; "early evolving"  . REPAIR PERONEAL TENDONS ANKLE  2010   right  . SHOULDER ARTHROSCOPY  ~ 2009   "frozen shoulder manipulation; right" (07/25/2012)  . TONSILLECTOMY  1977  . TRANSPHENOIDAL PITUITARY RESECTION  04/19/17   at [facility]   have Ovaries   Social History   Occupational History  . Not on file  Tobacco Use  . Smoking status: Never Smoker  . Smokeless tobacco: Never Used  Substance and Sexual Activity  . Alcohol use: Yes    Alcohol/week: 1.2 oz    Types: 2 Glasses of wine per week    Comment: occasional  . Drug use: No  . Sexual activity: Yes

## 2017-10-31 DIAGNOSIS — F4323 Adjustment disorder with mixed anxiety and depressed mood: Secondary | ICD-10-CM | POA: Diagnosis not present

## 2017-11-21 ENCOUNTER — Telehealth (INDEPENDENT_AMBULATORY_CARE_PROVIDER_SITE_OTHER): Payer: Self-pay | Admitting: Physical Medicine and Rehabilitation

## 2017-11-21 NOTE — Telephone Encounter (Signed)
Patient called stating that she is feeling 75% better and wanted to cancel the NCS.  I have cancelled the appointment with Dr. Louanne Skye.

## 2017-11-22 ENCOUNTER — Ambulatory Visit (INDEPENDENT_AMBULATORY_CARE_PROVIDER_SITE_OTHER): Payer: BLUE CROSS/BLUE SHIELD | Admitting: Specialist

## 2017-11-23 ENCOUNTER — Encounter (INDEPENDENT_AMBULATORY_CARE_PROVIDER_SITE_OTHER): Payer: Self-pay | Admitting: Physical Medicine and Rehabilitation

## 2017-11-28 ENCOUNTER — Ambulatory Visit (INDEPENDENT_AMBULATORY_CARE_PROVIDER_SITE_OTHER): Payer: BLUE CROSS/BLUE SHIELD | Admitting: Specialist

## 2017-12-14 DIAGNOSIS — Z85820 Personal history of malignant melanoma of skin: Secondary | ICD-10-CM | POA: Diagnosis not present

## 2017-12-14 DIAGNOSIS — D229 Melanocytic nevi, unspecified: Secondary | ICD-10-CM | POA: Diagnosis not present

## 2017-12-14 DIAGNOSIS — L820 Inflamed seborrheic keratosis: Secondary | ICD-10-CM | POA: Diagnosis not present

## 2018-04-27 ENCOUNTER — Other Ambulatory Visit: Payer: Self-pay | Admitting: Nurse Practitioner

## 2018-04-27 DIAGNOSIS — Z1231 Encounter for screening mammogram for malignant neoplasm of breast: Secondary | ICD-10-CM

## 2018-05-07 DIAGNOSIS — E893 Postprocedural hypopituitarism: Secondary | ICD-10-CM | POA: Diagnosis not present

## 2018-05-07 DIAGNOSIS — D352 Benign neoplasm of pituitary gland: Secondary | ICD-10-CM | POA: Diagnosis not present

## 2018-05-07 DIAGNOSIS — E232 Diabetes insipidus: Secondary | ICD-10-CM | POA: Diagnosis not present

## 2018-05-23 ENCOUNTER — Ambulatory Visit
Admission: RE | Admit: 2018-05-23 | Discharge: 2018-05-23 | Disposition: A | Discharge status: Home / Self Care | Payer: BLUE CROSS/BLUE SHIELD | Source: Ambulatory Visit | Attending: Nurse Practitioner | Admitting: Nurse Practitioner

## 2018-05-23 DIAGNOSIS — Z1231 Encounter for screening mammogram for malignant neoplasm of breast: Secondary | ICD-10-CM

## 2018-07-13 ENCOUNTER — Encounter: Payer: Self-pay | Admitting: Pulmonary Disease

## 2018-07-13 ENCOUNTER — Ambulatory Visit (INDEPENDENT_AMBULATORY_CARE_PROVIDER_SITE_OTHER): Payer: Managed Care, Other (non HMO) | Admitting: Pulmonary Disease

## 2018-07-13 VITALS — BP 130/74 | HR 80 | Ht 69.0 in | Wt 196.6 lb

## 2018-07-13 DIAGNOSIS — J42 Unspecified chronic bronchitis: Secondary | ICD-10-CM | POA: Diagnosis not present

## 2018-07-13 DIAGNOSIS — R059 Cough, unspecified: Secondary | ICD-10-CM

## 2018-07-13 DIAGNOSIS — R05 Cough: Secondary | ICD-10-CM | POA: Diagnosis not present

## 2018-07-13 MED ORDER — TIOTROPIUM BROMIDE MONOHYDRATE 1.25 MCG/ACT IN AERS: 2.0000 | INHALATION_SPRAY | Freq: Every day | RESPIRATORY_TRACT | 0 refills | Status: DC

## 2018-07-13 MED ORDER — IPRATROPIUM BROMIDE 0.02 % IN SOLN: 0.5000 mg | Freq: Four times a day (QID) | RESPIRATORY_TRACT | 12 refills | Status: DC | PRN

## 2018-07-13 NOTE — Patient Instructions (Addendum)
--  START Spiriva Respimat 1.65mcg 2 inhalations once a day --FOR YOUR RESCUE INHALER, use albuterol every 4 hours as needed for shortness of breath or wheezing. --START taking benadryl 25 mg nightly for postnasal drip --OK to start Mucinex-DM as directed for chest decongestion for 7 days. If you are not producing sputum after 2-3 days on the medication, consider stopping this medication. --We have ordered a nebulizer that can be used up to four times daily for shortness of breath and wheezing. This can be used in place of your rescue inhaler. We will order breathing test (full pulmonary function test) before you next visit in two weeks.  If you experience worsening respiratory symptoms, please seek medical attention immediately.

## 2018-07-13 NOTE — Progress Notes (Deleted)
   Subjective:    Patient ID: [name] [name], female    DOB: July 14, 1966, 52 y.o.   MRN: [id number]  HPI    Review of Systems  Constitutional: Negative for fever and unexpected weight change.  HENT: Positive for congestion, postnasal drip and rhinorrhea. Negative for dental problem, ear pain, nosebleeds, sinus pressure, sneezing, sore throat and trouble swallowing.   Eyes: Negative for redness and itching.  Respiratory: Positive for cough, chest tightness, shortness of breath and wheezing.   Cardiovascular: Negative for palpitations and leg swelling.  Gastrointestinal: Negative for nausea and vomiting.  Genitourinary: Negative for dysuria.  Musculoskeletal: Negative for joint swelling.  Skin: Negative for rash.  Allergic/Immunologic: Positive for environmental allergies and food allergies. Negative for immunocompromised state.  Neurological: Positive for headaches.  Hematological: Does not bruise/bleed easily.  Psychiatric/Behavioral: Negative for dysphoric mood. The patient is not nervous/anxious.        Objective:   Physical Exam        Assessment & Plan:

## 2018-07-13 NOTE — Progress Notes (Signed)
Synopsis: Referred in 06/2018 for asthma management  Subjective:   PATIENT ID: [name] [name] GENDER: female DOB: 16-Jul-1966, MRN: [id number]  HPI  Chief Complaint  Patient presents with  . Consult    Since last fall, She had bronchcitis,Pneumoina,recent travel.    [name] is a 52 year old female with adrenal insufficiency who presents to Pulmonary clinic as a new patient for management of her asthma.  Clinic notes were reviewed  Per 07/03/18 [place] note, she presented with productive cough, dyspnea, chest congestion, PND and orthopnea x 2 weeks. Treated with duonebs in-office. CXR was unremarkable. Given breo 1 puff daily x 14 days, albuterol, zpack and prednisone taper x 6 days. Also given tessalon perles and hydrocodone-homatropine for cough.  Per 07/11/18, her symptoms have not resolved. Dyspnea worsened to the point where she cannot walk. She was only using albuterol once daily. She was prescribed another prednisone taper x  6 days and another course of z pack  and referred to Pulmonary.  For the last 2-3 weeks, she has worsening dyspnea. Associated symptoms include chest congestion, rhinorrhea and headache. Denies fevers, chills and night sweats. She has daytime and nightime symptoms, often walking up needing to use the inhaler feeling short of breath. Initially has nonproductive cough and will become productive after a period of time.  Takes zyrtec daily. Does not feel [facility] has helped her symptoms. She initially improved with steroids however has not improved with this second course of steroids. She was using albuterol every hour which provides relief but causes her to feel shaky. Last year, she had similar symptoms of dyspnea and productive cough that lasted for 3 months.  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 Since then she has had nasal drainage that she feels is contributing to her symptoms.   Recent  travel to [place].  I have personally reviewed patient's past medical/family/social history, allergies, current medications.  Past Medical History:  Diagnosis Date  . 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 Arthritis    hnp-lumbar   . Blood dyscrasia    hx probable blood clot after ankle surgery  not definitive but treated per pt  . Cancer ([place])    "early evolvng melanoma "- legs   . Depression   . Diabetes insipidus ([place])   . Family history of adverse reaction to anesthesia    mom has N/V - zofran works  . Heart murmur    years ago-echo 05 no problems  . High cholesterol   . History of blood transfusion    "16 so far" (07/25/2012)  . History of bronchitis   . History of UTI   . Hypertension    no med for 1 yr  . 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 Migraines   . Peripheral vascular disease ([place])    ? dvt 2011 tx as preventive although did not show on scan after ankle surgery  . Pneumonia ~ 2009; 2010  . PONV (postoperative nausea and vomiting)    last 2 surgeries less nausea  . PONV (postoperative nausea and vomiting)    none with last surgery-be extra careful with nose patient haas septal hole      Family History  Problem Relation Age of Onset  . Hypertension Mother   . Coronary artery disease Mother   . Hypertension Father  Social History   Socioeconomic History  . Marital status: Married    Spouse name: Not on file  . Number of children: Not on file  . Years of education: Not on file  . Highest education level: Not on file  Occupational History  . Not on file  Social Needs  . Financial resource strain: Not on file  . Food insecurity:    Worry: Not on file    Inability: Not on file  . Transportation needs:    Medical: Not on file    Non-medical: Not on file  Tobacco Use  . Smoking status: Never Smoker  . Smokeless tobacco: Never Used  Substance and Sexual Activity  . Alcohol use: Yes      Alcohol/week: 2.0 standard drinks    Types: 2 Glasses of wine per week    Comment: occasional  . Drug use: No  . Sexual activity: Yes  Lifestyle  . Physical activity:    Days per week: Not on file    Minutes per session: Not on file  . Stress: Not on file  Relationships  . Social connections:    Talks on phone: Not on file    Gets together: Not on file    Attends religious service: Not on file    Active member of club or organization: Not on file    Attends meetings of clubs or organizations: Not on file    Relationship status: Not on file  . Intimate partner violence:    Fear of current or ex partner: Not on file    Emotionally abused: Not on file    Physically abused: Not on file    Forced sexual activity: Not on file  Other Topics Concern  . Not on file  Social History Narrative  . Not on file     Allergies  Allergen Reactions  . Amoxicillin Anaphylaxis, Hives and Itching  . Black Walnut Pollen Allergy Skin Test Anaphylaxis  . Penicillins Anaphylaxis    Has patient had a PCN reaction causing immediate rash, facial/tongue/throat swelling, SOB or lightheadedness with hypotension: Yes Has patient had a PCN reaction causing severe rash involving mucus membranes or skin necrosis: No Has patient had a PCN reaction that required hospitalization Yes Has patient had a PCN reaction occurring within the last 10 years: No If all of the above answers are "NO", then may proceed with Cephalosporin use.   . Sulfa Antibiotics Hives and Other (See Comments)    Hives, mouth blisters   . Chloroquine Other (See Comments)    Renal dysfunction  . Ciprofloxacin Hives  . Phenergan [Promethazine Hcl] Other (See Comments)    Caused "severe drop in blood pressure."  . Sulfamethoxazole Hives  . Other Other (See Comments)    From allergy testing--Canteloupe  . Shellfish Allergy Itching and Rash  . Vancomycin Cross Reactors Other (See Comments)    "RED MAN SYNDROME"  Pt stated she had  no reaction to Vancomycin when it is given slow     Outpatient Medications Prior to Visit  Medication Sig Dispense Refill  . ADDERALL XR 20 MG 24 hr capsule Take 20 mg by mouth daily as needed (concentration).   0  . desmopressin (DDAVP) 0.1 MG tablet Take 0.1 mg by mouth 2 (two) times daily.    Marland Kitchen estradiol (CLIMARA - DOSED IN MG/24 HR) 0.0375 mg/24hr patch Place 0.0375 mg onto the skin once a week. saturdays    . mupirocin ointment (BACTROBAN) 2 % Place 1 application  into the nose 2 (two) times daily as needed (infection prevention). Only uses when feeling cold or allergies are starting up or at times of procedures  0  . ondansetron (ZOFRAN) 4 MG tablet Take 1 tablet (4 mg total) by mouth every 8 (eight) hours as needed for nausea or vomiting. 30 tablet 0  . OVER THE COUNTER MEDICATION Take 6 tablets by mouth daily. Smarty Pants Vitamins    . oxycodone (OXY-IR) 5 MG capsule Take 1 capsule (5 mg total) by mouth every 4 (four) hours as needed. 30 capsule 0  . Probiotic CAPS Take 1 capsule by mouth daily.     . rosuvastatin (CRESTOR) 20 MG tablet Take 20 mg by mouth daily.    Marland Kitchen topiramate (TOPAMAX) 50 MG tablet Take 150 mg by mouth daily.    . valACYclovir (VALTREX) 1000 MG tablet Take two tablets as a single dose prn cold sore. (Patient taking differently: Take 2,000 mg by mouth daily as needed. Take two tablets as a single dose prn cold sore.) 20 tablet 1   No facility-administered medications prior to visit.     ROS Review of Systems  Constitutional: Negative for fever and unexpected weight change.  HENT: Positive for congestion, postnasal drip and rhinorrhea. Negative for dental problem, ear pain, nosebleeds, sinus pressure, sneezing, sore throat and trouble swallowing.   Eyes: Negative for redness and itching.  Respiratory: Positive for cough, chest tightness, shortness of breath and wheezing.   Cardiovascular: Negative for palpitations and leg swelling.  Gastrointestinal: Negative for  nausea and vomiting.  Genitourinary: Negative for dysuria.  Musculoskeletal: Negative for joint swelling.  Skin: Negative for rash.  Allergic/Immunologic: Positive for environmental allergies and food allergies. Negative for immunocompromised state.  Neurological: Positive for headaches.  Hematological: Does not bruise/bleed easily.  Psychiatric/Behavioral: Negative for dysphoric mood. The patient is not nervous/anxious.     Objective:  Physical Exam  Constitutional: She is oriented to person, place, and time. She appears well-developed and well-nourished. No distress.  HENT:  Head: Normocephalic and atraumatic.  Nose: Nose normal.  Mouth/Throat: No oropharyngeal exudate.  Cobblestoning of posterior pharynx  Eyes: Pupils are equal, round, and reactive to light. Conjunctivae and EOM are normal. No scleral icterus.  Neck: Normal range of motion. Neck supple. No JVD present. No tracheal deviation present.  Cardiovascular: Normal rate, regular rhythm, normal heart sounds and intact distal pulses. Exam reveals no gallop and no friction rub.  No murmur heard. Pulmonary/Chest: Effort normal and breath sounds normal. No stridor. No respiratory distress. She has no wheezes. She has no rales.  Abdominal: Soft. Bowel sounds are normal. She exhibits no distension. There is no tenderness.  Musculoskeletal: Normal range of motion. She exhibits no edema or deformity.  Lymphadenopathy:    She has no cervical adenopathy.  Neurological: She is alert and oriented to person, place, and time. No cranial nerve deficit or sensory deficit.  Skin: Skin is warm and dry. No rash noted. She is not diaphoretic. No erythema. No pallor.  Psychiatric: She has a normal mood and affect. Her behavior is normal. Judgment and thought content normal.  Vitals reviewed.    Vitals:   07/13/18 1615  BP: 130/74  Pulse: 80  SpO2: 100%  Weight: 196 lb 9.6 oz (89.2 kg)  Height: 5\' 9"  (1.753 m)    CBC    Component  Value Date/Time   WBC 10.8 (H) 12/08/2016 0608   RBC 3.47 (L) 12/08/2016 0608   HGB 11.0 (L) 12/08/2016 3299  HCT 34.0 (L) 12/08/2016 0608   PLT 182 12/08/2016 0608   MCV 98.0 12/08/2016 0608   MCH 31.7 12/08/2016 0608   MCHC 32.4 12/08/2016 0608   RDW 15.1 12/08/2016 0608   LYMPHSABS 0.8 12/07/2016 0644   MONOABS 0.8 12/07/2016 0644   EOSABS 0.0 12/07/2016 0644   BASOSABS 0.0 12/07/2016 0644     Chest imaging: [facility] CXR 07/03/18 (Report only)- Impression: No active disease  PFT: None on file   I have personally reviewed the above labs, images and tests noted above.    Assessment & Plan:   Chronic bronchitis, unspecified chronic bronchitis type ([place]) - Plan: ipratropium (ATROVENT) 0.02 % nebulizer solution, Ambulatory Referral for DME  Cough - Plan: Pulmonary function test  Discussion: 52 year old female non-smoker with adrenal insufficiency, hx pituitary adenoma s/p resection who presents with symptoms consistent with chronic bronchitis. Has had prednisone and antibiotic course x 2 without improvement. Unclear if bronchodilators improve symptoms. We will trial LAMA and obtain PFTs before next visit. Also recommend optimizing post-nasal drip. Patient is unable to take nasal spray.   --START Spiriva Respimat 1.22mcg 2 inhalations once a day --FOR YOUR RESCUE INHALER, use albuterol every 4 hours as needed for shortness of breath or wheezing. --START taking benadryl 25 mg nightly for postnasal drip --OK to start Mucinex-DM as directed for chest decongestion for 7 days. If you are not producing sputum after 2-3 days on the medication, consider stopping this medication. --We have ordered a nebulizer that can be used up to four times daily for shortness of breath and wheezing. This can be used in place of your rescue inhaler. We will order breathing test (full pulmonary function test) before you next visit in two weeks.  If you experience worsening respiratory  symptoms, please seek medical attention immediately.  Thank you for choosing [facility] for your health needs!   Chi Rodman Pickle, MD [place] [place] [facility] 07/13/2018 3:49 PM  Personal pager: [phone] If unanswered, please page [facility] On-call: [phone]    Current Outpatient Medications:  .  ADDERALL XR 20 MG 24 hr capsule, Take 20 mg by mouth daily as needed (concentration). , Disp: , Rfl: 0 .  desmopressin (DDAVP) 0.1 MG tablet, Take 0.1 mg by mouth 2 (two) times daily., Disp: , Rfl:  .  estradiol (CLIMARA - DOSED IN MG/24 HR) 0.0375 mg/24hr patch, Place 0.0375 mg onto the skin once a week. saturdays, Disp: , Rfl:  .  mupirocin ointment (BACTROBAN) 2 %, Place 1 application into the nose 2 (two) times daily as needed (infection prevention). Only uses when feeling cold or allergies are starting up or at times of procedures, Disp: , Rfl: 0 .  ondansetron (ZOFRAN) 4 MG tablet, Take 1 tablet (4 mg total) by mouth every 8 (eight) hours as needed for nausea or vomiting., Disp: 30 tablet, Rfl: 0 .  OVER THE COUNTER MEDICATION, Take 6 tablets by mouth daily. Smarty Pants Vitamins, Disp: , Rfl:  .  oxycodone (OXY-IR) 5 MG capsule, Take 1 capsule (5 mg total) by mouth every 4 (four) hours as needed., Disp: 30 capsule, Rfl: 0 .  Probiotic CAPS, Take 1 capsule by mouth daily. , Disp: , Rfl:  .  rosuvastatin (CRESTOR) 20 MG tablet, Take 20 mg by mouth daily., Disp: , Rfl:  .  topiramate (TOPAMAX) 50 MG tablet, Take 150 mg by mouth daily., Disp: , Rfl:  .  valACYclovir (VALTREX) 1000 MG tablet, Take two tablets as a  single dose prn cold sore. (Patient taking differently: Take 2,000 mg by mouth daily as needed. Take two tablets as a single dose prn cold sore.), Disp: 20 tablet, Rfl: 1

## 2018-07-15 ENCOUNTER — Encounter: Payer: Self-pay | Admitting: Pulmonary Disease

## 2018-07-25 ENCOUNTER — Ambulatory Visit (INDEPENDENT_AMBULATORY_CARE_PROVIDER_SITE_OTHER): Payer: Managed Care, Other (non HMO) | Admitting: Pulmonary Disease

## 2018-07-25 ENCOUNTER — Encounter: Payer: Self-pay | Admitting: Pulmonary Disease

## 2018-07-25 VITALS — BP 106/70 | HR 81 | Ht 69.0 in | Wt 196.9 lb

## 2018-07-25 DIAGNOSIS — J42 Unspecified chronic bronchitis: Secondary | ICD-10-CM | POA: Diagnosis not present

## 2018-07-25 DIAGNOSIS — R05 Cough: Secondary | ICD-10-CM

## 2018-07-25 DIAGNOSIS — R059 Cough, unspecified: Secondary | ICD-10-CM

## 2018-07-25 LAB — PULMONARY FUNCTION TEST
DL/VA % pred: 86 %
DL/VA: 4.6 ml/min/mmHg/L
DLCO UNC: 22.39 ml/min/mmHg
DLCO unc % pred: 72 %
FEF 25-75 Post: 2.07 L/sec
FEF 25-75 Pre: 3.45 L/sec
FEF2575-%Change-Post: -39 %
FEF2575-%Pred-Post: 68 %
FEF2575-%Pred-Pre: 114 %
FEV1-%CHANGE-POST: -9 %
FEV1-%PRED-PRE: 90 %
FEV1-%Pred-Post: 82 %
FEV1-POST: 2.69 L
FEV1-PRE: 2.97 L
FEV1FVC-%Change-Post: 6 %
FEV1FVC-%Pred-Pre: 102 %
FEV6-%CHANGE-POST: -15 %
FEV6-%PRED-POST: 75 %
FEV6-%Pred-Pre: 89 %
FEV6-POST: 3.07 L
FEV6-PRE: 3.61 L
FEV6FVC-%PRED-POST: 102 %
FEV6FVC-%PRED-PRE: 102 %
FVC-%Change-Post: -15 %
FVC-%PRED-PRE: 86 %
FVC-%Pred-Post: 73 %
FVC-POST: 3.07 L
FVC-Pre: 3.61 L
POST FEV6/FVC RATIO: 100 %
PRE FEV6/FVC RATIO: 100 %
Post FEV1/FVC ratio: 88 %
Pre FEV1/FVC ratio: 82 %
RV % PRED: 132 %
RV: 2.73 L
TLC % PRED: 106 %
TLC: 6.18 L

## 2018-07-25 MED ORDER — TIOTROPIUM BROMIDE MONOHYDRATE 1.25 MCG/ACT IN AERS: 2.0000 | INHALATION_SPRAY | Freq: Every day | RESPIRATORY_TRACT | 5 refills | Status: DC

## 2018-07-25 NOTE — Patient Instructions (Signed)
Chronic Bronchitis --CONTINUE Spiriva Respimat 1.50mcg 2 inhalations once a day --FOR YOUR RESCUE INHALER, use albuterol every 4-6 hours as needed for shortness of breath or wheezing. --CONTINUE nebulizer that can be used up to four times daily as needed for shortness of breath and wheezing.  --We have ordered Methacholine Challenge to further evaluate your cough, shortness of breath and wheezing  Chronic Sinusitis --CONTINUE benadryl 10 mg nightly as needed for allergy symptoms  If you experience worsening respiratory symptoms, please seek medical attention immediately.  Follow-up with me in 3 months

## 2018-07-25 NOTE — Progress Notes (Signed)
Synopsis: Referred in 06/2018 for follow-up of chronic bronchitis  Subjective:   PATIENT ID: [name] [name] GENDER: female DOB: 25-Mar-1966, MRN: [id number]  HPI  Chief Complaint  Patient presents with  . Chronic Bronchitis    feeling improvement with spiriva.  feels as if she can take a deep breath now and feel relief from it.   [name] [name] is a 52 year old female never smoker with adrenal insufficiency who presents to for chronic bronchitis follow-up.  She was initially seen two weeks ago for chronic bronchitis. She was started on trial of Spiriva and antihistamine. Today, she feels her shortness of breath has improved with her inhalers including her nebulizer. She has required her albuterol less frequently. However, she continues to have persistent productive cough, dyspnea and occasional wheezing. Her symptoms are worse at night when she wakes up at least twice overnight requiring inhaler to relieve symptoms. Activity will aggravate her symptoms. Associated symptoms include chest congestion, rhinorrhea and headache. Denies fevers, chills and night sweats.   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 Since then she has had nasal drainage that she feels is contributing to her symptoms.  Non-smoker Exposures: None  I have personally reviewed patient's past medical/family/social history, allergies, current medications.  Past Medical History:  Diagnosis Date  . 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 Arthritis    hnp-lumbar   . Blood dyscrasia    hx probable blood clot after ankle surgery  not definitive but treated per pt  . Cancer ([place])    "early evolvng melanoma "- legs   . Depression   . Diabetes insipidus ([place])   . Family history of adverse reaction to anesthesia    mom has N/V - zofran works  . Heart murmur    years ago-echo 05 no problems  .  High cholesterol   . History of blood transfusion    "16 so far" (07/25/2012)  . History of bronchitis   . History of UTI   . Hypertension    no med for 1 yr  . 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 Migraines   . Peripheral vascular disease ([place])    ? dvt 2011 tx as preventive although did not show on scan after ankle surgery  . Pneumonia ~ 2009; 2010  . PONV (postoperative nausea and vomiting)    last 2 surgeries less nausea  . PONV (postoperative nausea and vomiting)    none with last surgery-be extra careful with nose patient haas septal hole      Family History  Problem Relation Age of Onset  . Hypertension Mother   . Coronary artery disease Mother   . Hypertension Father      Social History   Socioeconomic History  . Marital status: Married    Spouse name: Not on file  . Number of children: Not on file  . Years of education: Not on file  . Highest education level: Not on file  Occupational History  . Not on file  Social Needs  . Financial resource strain: Not on file  . Food insecurity:    Worry: Not on file    Inability: Not on file  . Transportation needs:    Medical: Not on file    Non-medical: Not on file  Tobacco Use  . Smoking status: Never Smoker  . Smokeless tobacco: Never  Used  Substance and Sexual Activity  . Alcohol use: Yes    Alcohol/week: 2.0 standard drinks    Types: 2 Glasses of wine per week    Comment: occasional  . Drug use: No  . Sexual activity: Yes  Lifestyle  . Physical activity:    Days per week: Not on file    Minutes per session: Not on file  . Stress: Not on file  Relationships  . Social connections:    Talks on phone: Not on file    Gets together: Not on file    Attends religious service: Not on file    Active member of club or organization: Not on file    Attends meetings of clubs or organizations: Not on file    Relationship status: Not on file  . Intimate partner violence:    Fear of current or ex partner: Not on  file    Emotionally abused: Not on file    Physically abused: Not on file    Forced sexual activity: Not on file  Other Topics Concern  . Not on file  Social History Narrative  . Not on file     Allergies  Allergen Reactions  . Amoxicillin Anaphylaxis, Hives and Itching  . Black Walnut Pollen Allergy Skin Test Anaphylaxis  . Penicillins Anaphylaxis    Has patient had a PCN reaction causing immediate rash, facial/tongue/throat swelling, SOB or lightheadedness with hypotension: Yes Has patient had a PCN reaction causing severe rash involving mucus membranes or skin necrosis: No Has patient had a PCN reaction that required hospitalization Yes Has patient had a PCN reaction occurring within the last 10 years: No If all of the above answers are "NO", then may proceed with Cephalosporin use.   . Sulfa Antibiotics Hives and Other (See Comments)    Hives, mouth blisters   . Chloroquine Other (See Comments)    Renal dysfunction  . Ciprofloxacin Hives  . Phenergan [Promethazine Hcl] Other (See Comments)    Caused "severe drop in blood pressure."  . Sulfamethoxazole Hives  . Other Other (See Comments)    From allergy testing--Canteloupe  . Shellfish Allergy Itching and Rash  . Vancomycin Cross Reactors Other (See Comments)    "RED MAN SYNDROME"  Pt stated she had no reaction to Vancomycin when it is given slow     Outpatient Medications Prior to Visit  Medication Sig Dispense Refill  . ADDERALL XR 20 MG 24 hr capsule Take 20 mg by mouth daily as needed (concentration).   0  . desmopressin (DDAVP) 0.1 MG tablet Take 0.1 mg by mouth 2 (two) times daily.    Marland Kitchen estradiol (CLIMARA - DOSED IN MG/24 HR) 0.0375 mg/24hr patch Place 0.0375 mg onto the skin once a week. saturdays    . ipratropium (ATROVENT) 0.02 % nebulizer solution Take 2.5 mLs (0.5 mg total) by nebulization every 6 (six) hours as needed for wheezing or shortness of breath. icd- J42 75 mL 12  . OVER THE COUNTER MEDICATION Take  6 tablets by mouth daily. Smarty Pants Vitamins    . PROAIR HFA 108 (90 Base) MCG/ACT inhaler     . Probiotic CAPS Take 1 capsule by mouth daily.     . rosuvastatin (CRESTOR) 20 MG tablet Take 20 mg by mouth daily.    . Tiotropium Bromide Monohydrate (SPIRIVA RESPIMAT) 1.25 MCG/ACT AERS Inhale 2 puffs into the lungs daily. 1 Inhaler 0  . topiramate (TOPAMAX) 50 MG tablet Take 150 mg by mouth daily.    Marland Kitchen  valACYclovir (VALTREX) 1000 MG tablet Take two tablets as a single dose prn cold sore. (Patient taking differently: Take 2,000 mg by mouth daily as needed. Take two tablets as a single dose prn cold sore.) 20 tablet 1  . fluticasone furoate-vilanterol (BREO ELLIPTA) 100-25 MCG/INH AEPB Inhale 1 puff into the lungs daily.    . ondansetron (ZOFRAN) 4 MG tablet Take 1 tablet (4 mg total) by mouth every 8 (eight) hours as needed for nausea or vomiting. 30 tablet 0  . predniSONE (STERAPRED UNI-PAK 21 TAB) 10 MG (21) TBPK tablet prednisone 10 mg tablets in a dose pack     No facility-administered medications prior to visit.     Review of Systems  Constitutional: Negative for chills, fever and weight loss.  HENT: Positive for congestion. Negative for sore throat.        Postnasal drip and rhinorrhea   Respiratory: Positive for cough, shortness of breath and wheezing. Negative for hemoptysis and sputum production.   Cardiovascular: Negative for chest pain and palpitations.  Gastrointestinal: Negative for heartburn, nausea and vomiting.  Genitourinary: Negative for dysuria and frequency.  Musculoskeletal: Negative for joint pain.  Neurological: Positive for headaches.  Endo/Heme/Allergies: Positive for environmental allergies. Does not bruise/bleed easily.  Psychiatric/Behavioral: Negative for depression. The patient is nervous/anxious.    Objective:  Physical Exam  Constitutional: She is oriented to person, place, and time. She appears well-developed and well-nourished. No distress.  HENT:    Head: Normocephalic and atraumatic.  Nose: Nose normal.  Mouth/Throat: Oropharynx is clear and moist. No oropharyngeal exudate.  Eyes: Pupils are equal, round, and reactive to light. Conjunctivae and EOM are normal. No scleral icterus.  Neck: Normal range of motion. Neck supple. No JVD present. No tracheal deviation present.  Cardiovascular: Normal rate, regular rhythm, normal heart sounds and intact distal pulses. Exam reveals no gallop and no friction rub.  No murmur heard. Pulmonary/Chest: Effort normal and breath sounds normal. No stridor. No respiratory distress. She has no wheezes. She has no rales.  Abdominal: Soft. Bowel sounds are normal. She exhibits no distension. There is no tenderness.  Musculoskeletal: Normal range of motion. She exhibits no edema or deformity.  Lymphadenopathy:    She has no cervical adenopathy.  Neurological: She is alert and oriented to person, place, and time. No cranial nerve deficit or sensory deficit.  Skin: Skin is warm and dry. No rash noted. She is not diaphoretic. No erythema. No pallor.  Psychiatric: She has a normal mood and affect. Her behavior is normal. Judgment and thought content normal.  Vitals reviewed.    Vitals:   07/25/18 1244 07/25/18 1333  BP: 124/70 106/70  Pulse: 82 81  SpO2: 98% 99%  Weight: 199 lb (90.3 kg)   Height: 5\' 9"  (1.753 m)     CBC    Component Value Date/Time   WBC 10.8 (H) 12/08/2016 0608   RBC 3.47 (L) 12/08/2016 0608   HGB 11.0 (L) 12/08/2016 0608   HCT 34.0 (L) 12/08/2016 0608   PLT 182 12/08/2016 0608   MCV 98.0 12/08/2016 0608   MCH 31.7 12/08/2016 0608   MCHC 32.4 12/08/2016 0608   RDW 15.1 12/08/2016 0608   LYMPHSABS 0.8 12/07/2016 0644   MONOABS 0.8 12/07/2016 0644   EOSABS 0.0 12/07/2016 0644   BASOSABS 0.0 12/07/2016 0644   Chest imaging: [facility] CXR 07/03/18 (Report only)- Impression: No active disease  PFT 07/26/18: No obstruction or restriction. Mildly reduced  DLCO.  I have personally  reviewed the above labs, images and tests noted above.    Assessment & Plan:   Chronic bronchitis, unspecified chronic bronchitis type [facility])  Discussion: 52 year old female non-smoker with adrenal insufficiency, hx pituitary adenoma s/p resection who presents with symptoms consistent with chronic bronchitis. Symptoms resolved improved with LAMA and nebulizer however still have persistent symptoms. PFTs reviewed with patient - no obstruction or restriction. Mildly reduced DLCO. Will order methacholine challenge. Upper airway cough syndrome is likely contributing so recommend management with OTC antihistamine and nasal rinses. Patient is unable to take nasal spray.   Chronic Bronchitis --CONTINUE Spiriva Respimat 1.33mcg 2 inhalations once a day --FOR YOUR RESCUE INHALER, use albuterol every 4-6 hours as needed for shortness of breath or wheezing. --CONTINUE nebulizer that can be used up to four times daily as needed for shortness of breath and wheezing.  --We have ordered Methacholine Challenge to further evaluate your cough, shortness of breath and wheezing  Chronic Sinusitis --CONTINUE benadryl 10 mg nightly as needed for allergy symptoms  If you experience worsening respiratory symptoms, please seek medical attention immediately.  Follow-up with me in 3 months  Thank you for choosing [facility] for your health needs!   Akiko Schexnider Rodman Pickle, MD [facility] 07/25/2018 1:37 PM  Personal pager: [phone] If unanswered, please page [facility]CM On-call: [phone]    Current Outpatient Medications:  .  ADDERALL XR 20 MG 24 hr capsule, Take 20 mg by mouth daily as needed (concentration). , Disp: , Rfl: 0 .  desmopressin (DDAVP) 0.1 MG tablet, Take 0.1 mg by mouth 2 (two) times daily., Disp: , Rfl:  .  estradiol (CLIMARA - DOSED IN MG/24 HR) 0.0375 mg/24hr patch, Place 0.0375 mg onto the skin once a week. saturdays, Disp: , Rfl:  .   ipratropium (ATROVENT) 0.02 % nebulizer solution, Take 2.5 mLs (0.5 mg total) by nebulization every 6 (six) hours as needed for wheezing or shortness of breath. icd- J42, Disp: 75 mL, Rfl: 12 .  OVER THE COUNTER MEDICATION, Take 6 tablets by mouth daily. Smarty Pants Vitamins, Disp: , Rfl:  .  PROAIR HFA 108 (90 Base) MCG/ACT inhaler, , Disp: , Rfl:  .  Probiotic CAPS, Take 1 capsule by mouth daily. , Disp: , Rfl:  .  rosuvastatin (CRESTOR) 20 MG tablet, Take 20 mg by mouth daily., Disp: , Rfl:  .  Tiotropium Bromide Monohydrate (SPIRIVA RESPIMAT) 1.25 MCG/ACT AERS, Inhale 2 puffs into the lungs daily., Disp: 1 Inhaler, Rfl: 0 .  topiramate (TOPAMAX) 50 MG tablet, Take 150 mg by mouth daily., Disp: , Rfl:  .  valACYclovir (VALTREX) 1000 MG tablet, Take two tablets as a single dose prn cold sore. (Patient taking differently: Take 2,000 mg by mouth daily as needed. Take two tablets as a single dose prn cold sore.), Disp: 20 tablet, Rfl: 1

## 2018-07-25 NOTE — Progress Notes (Signed)
PFT completed today.  

## 2018-07-26 ENCOUNTER — Telehealth: Payer: Self-pay | Admitting: Pulmonary Disease

## 2018-07-26 ENCOUNTER — Encounter: Payer: Self-pay | Admitting: Pulmonary Disease

## 2018-07-26 NOTE — Telephone Encounter (Signed)
PA request from [facility] for Spiriva Respimat 1.25. CMM Key: [id number] PA has been submitted and approved. Pharmacy aware.  Nothing further needed.

## 2018-08-07 ENCOUNTER — Encounter (HOSPITAL_COMMUNITY)
Admission: RE | Admit: 2018-08-07 | Discharge: 2018-08-07 | Disposition: A | Discharge status: Home / Self Care | Payer: Managed Care, Other (non HMO) | Source: Ambulatory Visit | Attending: Pulmonary Disease | Admitting: Pulmonary Disease

## 2018-08-07 DIAGNOSIS — J42 Unspecified chronic bronchitis: Secondary | ICD-10-CM | POA: Diagnosis present

## 2018-08-07 LAB — PULMONARY FUNCTION TEST
FEF 25-75 POST: 2.27 L/s
FEF 25-75 Pre: 3.23 L/sec
FEF2575-%Change-Post: -29 %
FEF2575-%PRED-POST: 75 %
FEF2575-%Pred-Pre: 107 %
FEV1-%Change-Post: -7 %
FEV1-%PRED-PRE: 87 %
FEV1-%Pred-Post: 81 %
FEV1-Post: 2.67 L
FEV1-Pre: 2.88 L
FEV1FVC-%Change-Post: 5 %
FEV1FVC-%PRED-PRE: 102 %
FEV6-%Change-Post: -10 %
FEV6-%Pred-Post: 76 %
FEV6-%Pred-Pre: 85 %
FEV6-POST: 3.09 L
FEV6-Pre: 3.47 L
FEV6FVC-%Change-Post: 1 %
FEV6FVC-%PRED-POST: 102 %
FEV6FVC-%Pred-Pre: 101 %
FVC-%Change-Post: -12 %
FVC-%PRED-POST: 74 %
FVC-%Pred-Pre: 84 %
FVC-Post: 3.1 L
FVC-Pre: 3.53 L
Post FEV1/FVC ratio: 86 %
Post FEV6/FVC ratio: 100 %
Pre FEV1/FVC ratio: 82 %
Pre FEV6/FVC Ratio: 98 %

## 2018-08-07 MED ORDER — SODIUM CHLORIDE 0.9 % IN NEBU
3.0000 mL | INHALATION_SOLUTION | Freq: Once | RESPIRATORY_TRACT | Status: AC
  Administered 2018-08-07: 3 mL via RESPIRATORY_TRACT

## 2018-08-07 MED ORDER — METHACHOLINE 1 MG/ML NEB SOLN
2.0000 mL | Freq: Once | RESPIRATORY_TRACT | Status: AC
  Administered 2018-08-07: 2 mg via RESPIRATORY_TRACT

## 2018-08-07 MED ORDER — METHACHOLINE 4 MG/ML NEB SOLN: 2.0000 mL | Freq: Once | RESPIRATORY_TRACT | Status: DC

## 2018-08-07 MED ORDER — METHACHOLINE 0.25 MG/ML NEB SOLN
2.0000 mL | Freq: Once | RESPIRATORY_TRACT | Status: AC
  Administered 2018-08-07: 0.5 mg via RESPIRATORY_TRACT

## 2018-08-07 MED ORDER — METHACHOLINE 0.0625 MG/ML NEB SOLN
2.0000 mL | Freq: Once | RESPIRATORY_TRACT | Status: AC
  Administered 2018-08-07: 0.125 mg via RESPIRATORY_TRACT

## 2018-08-07 MED ORDER — ALBUTEROL SULFATE (2.5 MG/3ML) 0.083% IN NEBU
2.5000 mg | INHALATION_SOLUTION | Freq: Once | RESPIRATORY_TRACT | Status: AC
  Administered 2018-08-07: 2.5 mg via RESPIRATORY_TRACT

## 2018-08-07 MED ORDER — METHACHOLINE 16 MG/ML NEB SOLN: 2.0000 mL | Freq: Once | RESPIRATORY_TRACT | Status: DC

## 2018-08-20 ENCOUNTER — Ambulatory Visit (INDEPENDENT_AMBULATORY_CARE_PROVIDER_SITE_OTHER): Payer: Managed Care, Other (non HMO) | Admitting: Nurse Practitioner

## 2018-08-20 ENCOUNTER — Encounter: Payer: Self-pay | Admitting: Nurse Practitioner

## 2018-08-20 VITALS — BP 124/72 | HR 83 | Ht 69.0 in | Wt 202.8 lb

## 2018-08-20 DIAGNOSIS — J0100 Acute maxillary sinusitis, unspecified: Secondary | ICD-10-CM | POA: Diagnosis not present

## 2018-08-20 DIAGNOSIS — J40 Bronchitis, not specified as acute or chronic: Secondary | ICD-10-CM | POA: Diagnosis not present

## 2018-08-20 MED ORDER — DOXYCYCLINE HYCLATE 100 MG PO TABS: 100.0000 mg | ORAL_TABLET | Freq: Two times a day (BID) | ORAL | 0 refills | Status: DC

## 2018-08-20 MED ORDER — PREDNISONE 10 MG PO TABS: ORAL_TABLET | ORAL | 0 refills | Status: DC

## 2018-08-20 NOTE — Patient Instructions (Addendum)
Will order doxycycline Will order prednisone taper Continue current medications  General instructions: Hydrate  Drink enough water to keep your pee (urine) clear or pale yellow. Rest  Rest as much as possible.  Sleep with your head raised (elevated).  Make sure to get enough sleep each night. General instructions  Put a warm, moist washcloth on your face 3-4 times a day or as told by your doctor. This will help with discomfort.  Wash your hands often with soap and water. If there is no soap and water, use hand sanitizer.  Do not smoke. Avoid being around people who are smoking (secondhand smoke). Call the office if:  You have a fever.  Your symptoms get worse.  Your symptoms do not get better within 10 days.  Follow up with Dr. Loanne Drilling as scheduled

## 2018-08-20 NOTE — Progress Notes (Signed)
@Patient  ID: [name] [name], female    DOB: 11-20-1965, 52 y.o.   MRN: [id number]  Chief Complaint  Patient presents with  . Cough    with congestion    Referring provider: Prince Solian, MD  HPI 52 year old female never smoker with a history of chronic bronchitis followed by Dr. Loanne Drilling.   Tests: [facility] CXR 07/03/18 (Report only)- Impression: No active disease  PFT 07/26/18: No obstruction or restriction. Mildly reduced DLCO.  OV 08/21/18 - cough/sinus congestion Patient presents with cough and sinus congestion. States that symptoms started last Saturday. Symptoms have progressively worsened. She states that she is having sinus pressure and congestion. States that cough is productive of yellow sputum. Cough is worse at night. She is compliant with Spiriva. She denies any shortness of breath, fever, or chest pain. Denies any edema.       Allergies  Allergen Reactions  . Amoxicillin Anaphylaxis, Hives and Itching  . Black Walnut Pollen Allergy Skin Test Anaphylaxis  . Penicillins Anaphylaxis    Has patient had a PCN reaction causing immediate rash, facial/tongue/throat swelling, SOB or lightheadedness with hypotension: Yes Has patient had a PCN reaction causing severe rash involving mucus membranes or skin necrosis: No Has patient had a PCN reaction that required hospitalization Yes Has patient had a PCN reaction occurring within the last 10 years: No If all of the above answers are "NO", then may proceed with Cephalosporin use.   . Sulfa Antibiotics Hives and Other (See Comments)    Hives, mouth blisters   . Chloroquine Other (See Comments)    Renal dysfunction  . Ciprofloxacin Hives  . Phenergan [Promethazine Hcl] Other (See Comments)    Caused "severe drop in blood pressure."  . Sulfamethoxazole Hives  . Other Other (See Comments)    From allergy testing--Canteloupe  . Shellfish Allergy Itching and Rash  . Vancomycin Cross Reactors Other  (See Comments)    "RED MAN SYNDROME"  Pt stated she had no reaction to Vancomycin when it is given slow    Immunization History  Administered Date(s) Administered  . Influenza,inj,Quad PF,6+ Mos 07/01/2016, 07/14/2017  . Tdap 07/14/2014    Past Medical History:  Diagnosis Date  . 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 Arthritis    hnp-lumbar   . Blood dyscrasia    hx probable blood clot after ankle surgery  not definitive but treated per pt  . Cancer ([place])    "early evolvng melanoma "- legs   . Depression   . Diabetes insipidus ([place])   . Family history of adverse reaction to anesthesia    mom has N/V - zofran works  . Heart murmur    years ago-echo 05 no problems  . High cholesterol   . History of blood transfusion    "16 so far" (07/25/2012)  . History of bronchitis   . History of UTI   . Hypertension    no med for 1 yr  . 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 Migraines   . Peripheral vascular disease ([place])    ? dvt 2011 tx as preventive although did not show on scan after ankle surgery  . Pneumonia ~ 2009; 2010  . PONV (postoperative nausea and vomiting)    last 2 surgeries less nausea  . PONV (postoperative nausea and vomiting)    none with last surgery-be extra careful with nose patient haas septal  hole     Tobacco History: Social History   Tobacco Use  Smoking Status Never Smoker  Smokeless Tobacco Never Used   Counseling given: Yes   Outpatient Encounter Medications as of 08/20/2018  Medication Sig  . ADDERALL XR 20 MG 24 hr capsule Take 20 mg by mouth daily as needed (concentration).   Marland Kitchen desmopressin (DDAVP) 0.1 MG tablet Take 0.1 mg by mouth 2 (two) times daily.  Marland Kitchen estradiol (CLIMARA - DOSED IN MG/24 HR) 0.0375 mg/24hr patch Place 0.0375 mg onto the skin once a week. saturdays  . ipratropium (ATROVENT) 0.02 % nebulizer solution Take 2.5 mLs (0.5 mg total) by nebulization every 6 (six) hours  as needed for wheezing or shortness of breath. icd- J42  . OVER THE COUNTER MEDICATION Take 6 tablets by mouth daily. Smarty Pants Vitamins  . PROAIR HFA 108 (90 Base) MCG/ACT inhaler   . Probiotic CAPS Take 1 capsule by mouth daily.   . rosuvastatin (CRESTOR) 20 MG tablet Take 20 mg by mouth daily.  . Tiotropium Bromide Monohydrate (SPIRIVA RESPIMAT) 1.25 MCG/ACT AERS Inhale 2 puffs into the lungs daily.  Marland Kitchen topiramate (TOPAMAX) 50 MG tablet Take 150 mg by mouth daily.  . valACYclovir (VALTREX) 1000 MG tablet Take two tablets as a single dose prn cold sore. (Patient taking differently: Take 2,000 mg by mouth daily as needed. Take two tablets as a single dose prn cold sore.)  . doxycycline (VIBRA-TABS) 100 MG tablet Take 1 tablet (100 mg total) by mouth 2 (two) times daily.  . predniSONE (DELTASONE) 10 MG tablet Take 3 tabs for 2 days, then 2 tabs for 2 days, then 1 tab for 2 days, then stop   No facility-administered encounter medications on file as of 08/20/2018.      Review of Systems  Review of Systems  Constitutional: Negative.  Negative for chills and fever.  HENT: Negative.   Respiratory: Negative for cough, shortness of breath and wheezing.   Cardiovascular: Negative.  Negative for chest pain, palpitations and leg swelling.  Gastrointestinal: Negative.   Allergic/Immunologic: Negative.   Neurological: Negative.   Psychiatric/Behavioral: Negative.        Physical Exam  BP 124/72 (BP Location: Left Arm, Patient Position: Sitting, Cuff Size: Normal)   Pulse 83   Ht 5\' 9"  (1.753 m)   Wt 202 lb 12.8 oz (92 kg)   SpO2 97%   BMI 29.95 kg/m   Wt Readings from Last 5 Encounters:  08/20/18 202 lb 12.8 oz (92 kg)  07/25/18 196 lb 14.4 oz (89.3 kg)  07/13/18 196 lb 9.6 oz (89.2 kg)  10/25/17 170 lb (77.1 kg)  04/26/17 170 lb (77.1 kg)     Physical Exam  Constitutional: She is oriented to person, place, and time. She appears well-developed and well-nourished. No distress.    HENT:  Nose: Right sinus exhibits maxillary sinus tenderness and frontal sinus tenderness. Left sinus exhibits maxillary sinus tenderness and frontal sinus tenderness.  Cardiovascular: Normal rate and regular rhythm.  Pulmonary/Chest: Effort normal and breath sounds normal. No respiratory distress. She has no wheezes.  Musculoskeletal: She exhibits no edema.  Neurological: She is alert and oriented to person, place, and time.  Psychiatric: She has a normal mood and affect.  Nursing note and vitals reviewed.      Assessment & Plan:   Acute non-recurrent maxillary sinusitis Patient Instructions  Will order doxycycline Will order prednisone taper Continue current medications  General instructions: Hydrate  Drink enough water to keep  your pee (urine) clear or pale yellow. Rest  Rest as much as possible.  Sleep with your head raised (elevated).  Make sure to get enough sleep each night. General instructions  Put a warm, moist washcloth on your face 3-4 times a day or as told by your doctor. This will help with discomfort.  Wash your hands often with soap and water. If there is no soap and water, use hand sanitizer.  Do not smoke. Avoid being around people who are smoking (secondhand smoke). Call the office if:  You have a fever.  Your symptoms get worse.  Your symptoms do not get better within 10 days.  Follow up with Dr. Loanne Drilling as scheduled     Bronchitis Patient Instructions  Will order doxycycline Will order prednisone taper Continue current medications  General instructions: Hydrate  Drink enough water to keep your pee (urine) clear or pale yellow. Rest  Rest as much as possible.  Sleep with your head raised (elevated).  Make sure to get enough sleep each night. General instructions  Put a warm, moist washcloth on your face 3-4 times a day or as told by your doctor. This will help with discomfort.  Wash your hands often with soap and water.  If there is no soap and water, use hand sanitizer.  Do not smoke. Avoid being around people who are smoking (secondhand smoke). Call the office if:  You have a fever.  Your symptoms get worse.  Your symptoms do not get better within 10 days.  Follow up with Dr. Loanne Drilling as scheduled        Fenton Foy, NP 08/22/2018

## 2018-08-22 ENCOUNTER — Encounter: Payer: Self-pay | Admitting: Nurse Practitioner

## 2018-08-22 DIAGNOSIS — J0100 Acute maxillary sinusitis, unspecified: Secondary | ICD-10-CM | POA: Insufficient documentation

## 2018-08-22 DIAGNOSIS — J209 Acute bronchitis, unspecified: Secondary | ICD-10-CM | POA: Insufficient documentation

## 2018-08-22 DIAGNOSIS — J40 Bronchitis, not specified as acute or chronic: Secondary | ICD-10-CM | POA: Insufficient documentation

## 2018-08-22 NOTE — Assessment & Plan Note (Signed)
Patient Instructions  Will order doxycycline Will order prednisone taper Continue current medications  General instructions: Hydrate  Drink enough water to keep your pee (urine) clear or pale yellow. Rest  Rest as much as possible.  Sleep with your head raised (elevated).  Make sure to get enough sleep each night. General instructions  Put a warm, moist washcloth on your face 3-4 times a day or as told by your doctor. This will help with discomfort.  Wash your hands often with soap and water. If there is no soap and water, use hand sanitizer.  Do not smoke. Avoid being around people who are smoking (secondhand smoke). Call the office if:  You have a fever.  Your symptoms get worse.  Your symptoms do not get better within 10 days.  Follow up with Dr. Loanne Drilling as scheduled

## 2018-09-20 ENCOUNTER — Telehealth: Payer: Self-pay | Admitting: Pulmonary Disease

## 2018-09-20 ENCOUNTER — Encounter: Payer: Self-pay | Admitting: Nurse Practitioner

## 2018-09-20 ENCOUNTER — Ambulatory Visit (INDEPENDENT_AMBULATORY_CARE_PROVIDER_SITE_OTHER): Payer: Managed Care, Other (non HMO) | Admitting: Nurse Practitioner

## 2018-09-20 VITALS — BP 128/72 | HR 81 | Temp 98.7°F | Ht 69.0 in | Wt 203.2 lb

## 2018-09-20 DIAGNOSIS — J40 Bronchitis, not specified as acute or chronic: Secondary | ICD-10-CM

## 2018-09-20 DIAGNOSIS — R6889 Other general symptoms and signs: Secondary | ICD-10-CM

## 2018-09-20 DIAGNOSIS — J0100 Acute maxillary sinusitis, unspecified: Secondary | ICD-10-CM | POA: Diagnosis not present

## 2018-09-20 LAB — POCT INFLUENZA A/B
INFLUENZA B, POC: NEGATIVE
Influenza A, POC: NEGATIVE

## 2018-09-20 MED ORDER — BENZONATATE 100 MG PO CAPS: 100.0000 mg | ORAL_CAPSULE | Freq: Three times a day (TID) | ORAL | 1 refills | Status: DC

## 2018-09-20 MED ORDER — AZITHROMYCIN 250 MG PO TABS: ORAL_TABLET | ORAL | 0 refills | Status: DC

## 2018-09-20 MED ORDER — PREDNISONE 10 MG PO TABS: ORAL_TABLET | ORAL | 0 refills | Status: DC

## 2018-09-20 NOTE — Assessment & Plan Note (Signed)
Patient Instructions  Flu Swab was negative today Will order azithromycin (patient has multiple anitibiotic allergies) Will order prednisone taper mucinex samples given Will order tessalon pearls for cough Follow up with Dr. Loanne Drilling at her first available appointment or sooner if needed  General instructions:  Hydrate  Drink enough water to keep your pee (urine) clear or pale yellow. Rest  Rest as much as possible.  Sleep with your head raised (elevated).  Make sure to get enough sleep each night. Other instructions  Put a warm, moist washcloth on your face 3-4 times a day or as told by your doctor. This will help with discomfort.  Wash your hands often with soap and water. If there is no soap and water, use hand sanitizer.  Do not smoke. Avoid being around people who are smoking (secondhand smoke). Call the office if:  You have a fever.  Your symptoms get worse.  Your symptoms do not get better within 10 days.

## 2018-09-20 NOTE — Progress Notes (Signed)
@Patient  ID: [name] [name], female    DOB: Oct 17, 1965, 52 y.o.   MRN: [id number]  Chief Complaint  Patient presents with  . Shortness of Breath    with congestion and sore throat    Referring provider: Prince Solian, MD  HPI 52 year old female never smoker with a history of chronic bronchitis followed by Dr. Loanne Drilling.   Tests: [facility] CXR 07/03/18 (Report only)- Impression: No active disease  PFT:  PFT Results Latest Ref Rng & Units 08/07/2018 07/25/2018  FVC-Pre L 3.53 3.61  FVC-Predicted Pre % 84 86  FVC-Post L 3.10 3.07  FVC-Predicted Post % 74 73  Pre FEV1/FVC % % 82 82  Post FEV1/FCV % % 86 88  FEV1-Pre L 2.88 2.97  FEV1-Predicted Pre % 87 90  FEV1-Post L 2.67 2.69  DLCO UNC% % - 72  DLCO COR %Predicted % - 86  TLC L - 6.18  TLC % Predicted % - 106  RV % Predicted % - 132     OV 09/20/18 - acute chest congestion Patient presents today with acute chest congestion and cough.  She states that symptoms started 3 days ago.  Progressively worsened.  Also complains of sore throat and bilateral ear pain.  Reports sinus congestion pressure and pain.  She states that she was running a fever yesterday but does not have a thermometer to check an accurate temperature.  She is afebrile in office today.  She denies any chest pain, shortness of breath, or edema.    Allergies  Allergen Reactions  . Amoxicillin Anaphylaxis, Hives and Itching  . Black Walnut Pollen Allergy Skin Test Anaphylaxis  . Penicillins Anaphylaxis    Has patient had a PCN reaction causing immediate rash, facial/tongue/throat swelling, SOB or lightheadedness with hypotension: Yes Has patient had a PCN reaction causing severe rash involving mucus membranes or skin necrosis: No Has patient had a PCN reaction that required hospitalization Yes Has patient had a PCN reaction occurring within the last 10 years: No If all of the above answers are "NO", then may proceed with  Cephalosporin use.   . Sulfa Antibiotics Hives and Other (See Comments)    Hives, mouth blisters   . Chloroquine Other (See Comments)    Renal dysfunction  . Ciprofloxacin Hives  . Phenergan [Promethazine Hcl] Other (See Comments)    Caused "severe drop in blood pressure."  . Sulfamethoxazole Hives  . Other Other (See Comments)    From allergy testing--Canteloupe  . Shellfish Allergy Itching and Rash  . Vancomycin Cross Reactors Other (See Comments)    "RED MAN SYNDROME"  Pt stated she had no reaction to Vancomycin when it is given slow    Immunization History  Administered Date(s) Administered  . Influenza,inj,Quad PF,6+ Mos 07/01/2016, 07/14/2017  . Tdap 07/14/2014    Past Medical History:  Diagnosis Date  . 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 Arthritis    hnp-lumbar   . Blood dyscrasia    hx probable blood clot after ankle surgery  not definitive but treated per pt  . Cancer ([place])    "early evolvng melanoma "- legs   . Depression   . Diabetes insipidus ([place])   . Family history of adverse reaction to anesthesia    mom has N/V - zofran works  . Heart murmur    years ago-echo 05 no problems  . High cholesterol   .  History of blood transfusion    "16 so far" (07/25/2012)  . History of bronchitis   . History of UTI   . Hypertension    no med for 1 yr  . 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 Migraines   . Peripheral vascular disease ([place])    ? dvt 2011 tx as preventive although did not show on scan after ankle surgery  . Pneumonia ~ 2009; 2010  . PONV (postoperative nausea and vomiting)    last 2 surgeries less nausea  . PONV (postoperative nausea and vomiting)    none with last surgery-be extra careful with nose patient haas septal hole     Tobacco History: Social History   Tobacco Use  Smoking Status Never Smoker  Smokeless Tobacco Never Used   Counseling given: Yes   Outpatient  Encounter Medications as of 09/20/2018  Medication Sig  . ADDERALL XR 20 MG 24 hr capsule Take 20 mg by mouth daily as needed (concentration).   Marland Kitchen desmopressin (DDAVP) 0.1 MG tablet Take 0.1 mg by mouth 2 (two) times daily.  Marland Kitchen doxycycline (VIBRA-TABS) 100 MG tablet Take 1 tablet (100 mg total) by mouth 2 (two) times daily.  Marland Kitchen estradiol (CLIMARA - DOSED IN MG/24 HR) 0.0375 mg/24hr patch Place 0.0375 mg onto the skin once a week. saturdays  . ipratropium (ATROVENT) 0.02 % nebulizer solution Take 2.5 mLs (0.5 mg total) by nebulization every 6 (six) hours as needed for wheezing or shortness of breath. icd- J42  . OVER THE COUNTER MEDICATION Take 6 tablets by mouth daily. Smarty Pants Vitamins  . predniSONE (DELTASONE) 10 MG tablet Take 3 tabs for 2 days, then 2 tabs for 2 days, then 1 tab for 2 days, then stop  . PROAIR HFA 108 (90 Base) MCG/ACT inhaler   . Probiotic CAPS Take 1 capsule by mouth daily.   . rosuvastatin (CRESTOR) 20 MG tablet Take 20 mg by mouth daily.  . Tiotropium Bromide Monohydrate (SPIRIVA RESPIMAT) 1.25 MCG/ACT AERS Inhale 2 puffs into the lungs daily.  Marland Kitchen topiramate (TOPAMAX) 50 MG tablet Take 150 mg by mouth daily.  . valACYclovir (VALTREX) 1000 MG tablet Take two tablets as a single dose prn cold sore. (Patient taking differently: Take 2,000 mg by mouth daily as needed. Take two tablets as a single dose prn cold sore.)  . azithromycin (ZITHROMAX) 250 MG tablet Take 2 tablets (500 mg) on day 1, then take 1 tablet (250 mg) on days 2-5  . benzonatate (TESSALON) 100 MG capsule Take 1 capsule (100 mg total) by mouth 3 (three) times daily.  . predniSONE (DELTASONE) 10 MG tablet Take 4 tabs for 2 days, then 3 tabs for 2 days, then 2 tabs for 2 days, then 1 tab for 2 days, then stop   No facility-administered encounter medications on file as of 09/20/2018.      Review of Systems  Review of Systems  Constitutional: Positive for chills and fever.  HENT: Positive for congestion,  postnasal drip, sinus pressure, sinus pain and sore throat.   Respiratory: Positive for cough and shortness of breath.   Cardiovascular: Negative.  Negative for chest pain, palpitations and leg swelling.  Gastrointestinal: Negative.   Allergic/Immunologic: Negative.   Neurological: Negative.   Psychiatric/Behavioral: Negative.        Physical Exam  BP 128/72 (BP Location: Left Arm, Patient Position: Sitting, Cuff Size: Normal)   Pulse 81   Temp 98.7 F (37.1 C)   Ht 5\' 9"  (1.753 m)  Wt 203 lb 3.2 oz (92.2 kg)   SpO2 97%   BMI 30.01 kg/m   Wt Readings from Last 5 Encounters:  09/20/18 203 lb 3.2 oz (92.2 kg)  08/20/18 202 lb 12.8 oz (92 kg)  07/25/18 196 lb 14.4 oz (89.3 kg)  07/13/18 196 lb 9.6 oz (89.2 kg)  10/25/17 170 lb (77.1 kg)     Physical Exam Vitals signs and nursing note reviewed.  Constitutional:      General: She is not in acute distress.    Appearance: She is well-developed.  HENT:     Right Ear: Tympanic membrane is bulging.     Left Ear: Hearing, tympanic membrane, ear canal and external ear normal.     Nose:     Right Sinus: Maxillary sinus tenderness and frontal sinus tenderness present.     Left Sinus: Maxillary sinus tenderness and frontal sinus tenderness present.  Cardiovascular:     Rate and Rhythm: Normal rate and regular rhythm.  Pulmonary:     Effort: Pulmonary effort is normal.     Breath sounds: Normal breath sounds. No decreased breath sounds, wheezing or rhonchi.  Musculoskeletal:     Right lower leg: No edema.  Neurological:     Mental Status: She is alert and oriented to person, place, and time.       Assessment & Plan:   Acute non-recurrent maxillary sinusitis Patient Instructions  Flu Swab was negative today Will order azithromycin (patient has multiple anitibiotic allergies) Will order prednisone taper mucinex samples given Will order tessalon pearls for cough Follow up with Dr. Loanne Drilling at her first available  appointment or sooner if needed  General instructions:  Hydrate  Drink enough water to keep your pee (urine) clear or pale yellow. Rest  Rest as much as possible.  Sleep with your head raised (elevated).  Make sure to get enough sleep each night. Other instructions  Put a warm, moist washcloth on your face 3-4 times a day or as told by your doctor. This will help with discomfort.  Wash your hands often with soap and water. If there is no soap and water, use hand sanitizer.  Do not smoke. Avoid being around people who are smoking (secondhand smoke). Call the office if:  You have a fever.  Your symptoms get worse.  Your symptoms do not get better within 10 days.     Bronchitis Patient Instructions  Flu Swab was negative today Will order azithromycin (patient has multiple anitibiotic allergies) Will order prednisone taper mucinex samples given Will order tessalon pearls for cough Follow up with Dr. Loanne Drilling at her first available appointment or sooner if needed  General instructions:  Hydrate  Drink enough water to keep your pee (urine) clear or pale yellow. Rest  Rest as much as possible.  Sleep with your head raised (elevated).  Make sure to get enough sleep each night. Other instructions  Put a warm, moist washcloth on your face 3-4 times a day or as told by your doctor. This will help with discomfort.  Wash your hands often with soap and water. If there is no soap and water, use hand sanitizer.  Do not smoke. Avoid being around people who are smoking (secondhand smoke). Call the office if:  You have a fever.  Your symptoms get worse.  Your symptoms do not get better within 10 days.        Fenton Foy, NP 09/20/2018

## 2018-09-20 NOTE — Telephone Encounter (Signed)
Called and spoke with pt who states she has head/chest congestion, coughing up yellow phlegm. Pt also states she knows she is running a fever but does not know exactly what temp is due to thermometer being broken.  I stated to pt that we should get her to come in for an appt to further address her symptoms. Pt expressed understanding. OV scheduled for pt with Lazaro Arms, NP at 10:30. Nothing further needed.

## 2018-09-20 NOTE — Patient Instructions (Addendum)
Flu Swab was negative today Will order azithromycin (patient has multiple anitibiotic allergies) Will order prednisone taper mucinex samples given Will order tessalon pearls for cough Follow up with Dr. Loanne Drilling at her first available appointment or sooner if needed  General instructions:  Hydrate  Drink enough water to keep your pee (urine) clear or pale yellow. Rest  Rest as much as possible.  Sleep with your head raised (elevated).  Make sure to get enough sleep each night. Other instructions  Put a warm, moist washcloth on your face 3-4 times a day or as told by your doctor. This will help with discomfort.  Wash your hands often with soap and water. If there is no soap and water, use hand sanitizer.  Do not smoke. Avoid being around people who are smoking (secondhand smoke). Call the office if:  You have a fever.  Your symptoms get worse.  Your symptoms do not get better within 10 days.

## 2018-09-24 ENCOUNTER — Telehealth: Payer: Self-pay | Admitting: Pulmonary Disease

## 2018-09-24 DIAGNOSIS — R059 Cough, unspecified: Secondary | ICD-10-CM

## 2018-09-24 DIAGNOSIS — R05 Cough: Secondary | ICD-10-CM

## 2018-09-24 NOTE — Telephone Encounter (Signed)
Called and spoke with patient, she was seen on 09/20/18 by [place], she stated that her cough has gotten worse, and is increasingly productive. She is coughing so much that she is vomiting. She was prescribed a zpak and prednisone as well as tessalon perles. She has two more days of prednisone and she has finished her zpak. She stated that she tessalon perles are not helping. SG please advise, thank you.

## 2018-09-24 NOTE — Telephone Encounter (Signed)
She needs to be seen for an acute visit with CXR prior to visit. Thanks

## 2018-09-24 NOTE — Addendum Note (Signed)
Addended by: Della Goo C on: 09/24/2018 01:45 PM   Modules accepted: Orders

## 2018-09-24 NOTE — Telephone Encounter (Signed)
Called patient, she is aware and verbalized understanding. Patient is scheduled and order was placed for cxr. Nothing further needed.

## 2018-09-25 ENCOUNTER — Encounter: Payer: Self-pay | Admitting: Primary Care

## 2018-09-25 ENCOUNTER — Ambulatory Visit (INDEPENDENT_AMBULATORY_CARE_PROVIDER_SITE_OTHER): Payer: Managed Care, Other (non HMO) | Admitting: Primary Care

## 2018-09-25 ENCOUNTER — Ambulatory Visit (INDEPENDENT_AMBULATORY_CARE_PROVIDER_SITE_OTHER)
Admission: RE | Admit: 2018-09-25 | Discharge: 2018-09-25 | Disposition: A | Discharge status: Home / Self Care | Payer: Managed Care, Other (non HMO) | Source: Ambulatory Visit | Attending: Acute Care | Admitting: Acute Care

## 2018-09-25 VITALS — BP 124/88 | HR 81 | Temp 98.3°F | Ht 69.0 in | Wt 203.2 lb

## 2018-09-25 DIAGNOSIS — R0600 Dyspnea, unspecified: Secondary | ICD-10-CM

## 2018-09-25 DIAGNOSIS — R059 Cough, unspecified: Secondary | ICD-10-CM

## 2018-09-25 DIAGNOSIS — J40 Bronchitis, not specified as acute or chronic: Secondary | ICD-10-CM

## 2018-09-25 DIAGNOSIS — R0609 Other forms of dyspnea: Secondary | ICD-10-CM | POA: Diagnosis not present

## 2018-09-25 DIAGNOSIS — R05 Cough: Secondary | ICD-10-CM

## 2018-09-25 DIAGNOSIS — J42 Unspecified chronic bronchitis: Secondary | ICD-10-CM | POA: Diagnosis not present

## 2018-09-25 LAB — NITRIC OXIDE: FENO LEVEL (PPB): 8

## 2018-09-25 MED ORDER — FLUTTER DEVI: 0 refills | Status: AC

## 2018-09-25 MED ORDER — TIOTROPIUM BROMIDE MONOHYDRATE 1.25 MCG/ACT IN AERS: 2.0000 | INHALATION_SPRAY | Freq: Every day | RESPIRATORY_TRACT | 5 refills | Status: DC

## 2018-09-25 MED ORDER — MONTELUKAST SODIUM 10 MG PO TABS: 10.0000 mg | ORAL_TABLET | Freq: Every day | ORAL | 11 refills | Status: DC

## 2018-09-25 MED ORDER — TIOTROPIUM BROMIDE MONOHYDRATE 1.25 MCG/ACT IN AERS: 2.0000 | INHALATION_SPRAY | Freq: Every day | RESPIRATORY_TRACT | 2 refills | Status: DC

## 2018-09-25 MED ORDER — IPRATROPIUM BROMIDE 0.02 % IN SOLN: 0.5000 mg | Freq: Four times a day (QID) | RESPIRATORY_TRACT | 12 refills | Status: DC | PRN

## 2018-09-25 NOTE — Progress Notes (Signed)
@Patient  ID: [name] [name], female    DOB: Dec 19, 1965, 52 y.o.   MRN: [id number]  Chief Complaint  Patient presents with  . Acute Visit    cough with yellow mucus, SOB, chest tightness    Referring provider: Prince Solian, MD  HPI: 52 year old female, never smoked. PMH significant for asthma, bronchitis, sinusitis, adrenal insufficiency. Patient of Dr. Loanne Drilling, seen for initial consult on 07/13/18. Treated multiple times with zpack and prednisone taper. Some initial improvement with prednisone. Does not feel Memory Dance has helped. Started on Spiriva, antihistamine and mucinex. Continues Albuterol rescue inhaler/ nebulizer as needed for shortness of breath and wheezing. Seen by Pulmonary NP on 11/25 and was given course of doxycyline and prednisone taper. Represented on 12/26 for chest congestion/cough. Influenza swab was negative, she was given zpack and prednisone taper. Recommended mucinex and tessalon pearles.   09/26/2018 Patient presents today for acute visit with continued cough/chest congestion. Accompanied by her husband. Associated shortness of breath and chest tightness. Called office yesterday and was recommended to have chest xray prior to visit. States that her symptoms start with nasal congestion and then settle in her chest. Breathing becomes compromised. States that she ran out of Spiriva 1 month ago. Has been using Ventolin rescue inhaler every hour. Still taking mucinex. Taking benadryl 3-4 times a day. FENO 8 today.    Allergies  Allergen Reactions  . Amoxicillin Anaphylaxis, Hives and Itching  . Black Walnut Pollen Allergy Skin Test Anaphylaxis  . Penicillins Anaphylaxis    Has patient had a PCN reaction causing immediate rash, facial/tongue/throat swelling, SOB or lightheadedness with hypotension: Yes Has patient had a PCN reaction causing severe rash involving mucus membranes or skin necrosis: No Has patient had a PCN reaction that required hospitalization Yes Has  patient had a PCN reaction occurring within the last 10 years: No If all of the above answers are "NO", then may proceed with Cephalosporin use.   . Sulfa Antibiotics Hives and Other (See Comments)    Hives, mouth blisters   . Chloroquine Other (See Comments)    Renal dysfunction  . Ciprofloxacin Hives  . Phenergan [Promethazine Hcl] Other (See Comments)    Caused "severe drop in blood pressure."  . Sulfamethoxazole Hives  . Other Other (See Comments)    From allergy testing--Canteloupe  . Shellfish Allergy Itching and Rash  . Vancomycin Cross Reactors Other (See Comments)    "RED MAN SYNDROME"  Pt stated she had no reaction to Vancomycin when it is given slow    Immunization History  Administered Date(s) Administered  . Influenza,inj,Quad PF,6+ Mos 07/01/2016, 07/14/2017  . Tdap 07/14/2014    Past Medical History:  Diagnosis Date  . 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 Arthritis    hnp-lumbar   . Blood dyscrasia    hx probable blood clot after ankle surgery  not definitive but treated per pt  . Cancer ([place])    "early evolvng melanoma "- legs   . Depression   . Diabetes insipidus ([place])   . Family history of adverse reaction to anesthesia    mom has N/V - zofran works  . Heart murmur    years ago-echo 05 no problems  . High cholesterol   . History of blood transfusion    "16 so far" (07/25/2012)  . History of bronchitis   . History of UTI   . Hypertension  no med for 1 yr  . 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 Migraines   . Peripheral vascular disease ([place])    ? dvt 2011 tx as preventive although did not show on scan after ankle surgery  . Pneumonia ~ 2009; 2010  . PONV (postoperative nausea and vomiting)    last 2 surgeries less nausea  . PONV (postoperative nausea and vomiting)    none with last surgery-be extra careful with nose patient haas septal hole     Tobacco History: Social History     Tobacco Use  Smoking Status Never Smoker  Smokeless Tobacco Never Used   Counseling given: Not Answered   Outpatient Medications Prior to Visit  Medication Sig Dispense Refill  . ADDERALL XR 20 MG 24 hr capsule Take 20 mg by mouth daily as needed (concentration).   0  . desmopressin (DDAVP) 0.1 MG tablet Take 0.1 mg by mouth 2 (two) times daily.    Marland Kitchen estradiol (CLIMARA - DOSED IN MG/24 HR) 0.0375 mg/24hr patch Place 0.0375 mg onto the skin once a week. saturdays    . OVER THE COUNTER MEDICATION Take 6 tablets by mouth daily. Smarty Pants Vitamins    . PROAIR HFA 108 (90 Base) MCG/ACT inhaler     . Probiotic CAPS Take 1 capsule by mouth daily.     . rosuvastatin (CRESTOR) 20 MG tablet Take 20 mg by mouth daily.    Marland Kitchen topiramate (TOPAMAX) 50 MG tablet Take 150 mg by mouth daily.    . valACYclovir (VALTREX) 1000 MG tablet Take two tablets as a single dose prn cold sore. (Patient taking differently: Take 2,000 mg by mouth daily as needed. Take two tablets as a single dose prn cold sore.) 20 tablet 1  . azithromycin (ZITHROMAX) 250 MG tablet Take 2 tablets (500 mg) on day 1, then take 1 tablet (250 mg) on days 2-5 6 tablet 0  . benzonatate (TESSALON) 100 MG capsule Take 1 capsule (100 mg total) by mouth 3 (three) times daily. 30 capsule 1  . doxycycline (VIBRA-TABS) 100 MG tablet Take 1 tablet (100 mg total) by mouth 2 (two) times daily. 14 tablet 0  . ipratropium (ATROVENT) 0.02 % nebulizer solution Take 2.5 mLs (0.5 mg total) by nebulization every 6 (six) hours as needed for wheezing or shortness of breath. icd- J42 75 mL 12  . predniSONE (DELTASONE) 10 MG tablet Take 3 tabs for 2 days, then 2 tabs for 2 days, then 1 tab for 2 days, then stop 12 tablet 0  . predniSONE (DELTASONE) 10 MG tablet Take 4 tabs for 2 days, then 3 tabs for 2 days, then 2 tabs for 2 days, then 1 tab for 2 days, then stop 20 tablet 0  . Tiotropium Bromide Monohydrate (SPIRIVA RESPIMAT) 1.25 MCG/ACT AERS Inhale 2 puffs  into the lungs daily. 1 Inhaler 5   No facility-administered medications prior to visit.     Review of Systems  Review of Systems  Constitutional: Negative.   HENT: Positive for congestion and postnasal drip. Negative for ear pain.   Respiratory: Positive for cough, chest tightness and shortness of breath. Negative for wheezing and stridor.   Cardiovascular: Negative.    Physical Exam  BP 124/88 (BP Location: Left Arm, Cuff Size: Normal)   Pulse 81   Temp 98.3 F (36.8 C)   Ht 5\' 9"  (1.753 m)   Wt 203 lb 3.2 oz (92.2 kg)   SpO2 97%   BMI 30.01 kg/m  Physical Exam Constitutional:      General: She is not in acute distress.    Appearance: Normal appearance. She is not ill-appearing.     Comments: Overweight  HENT:     Head: Normocephalic and atraumatic.     Right Ear: Tympanic membrane normal.     Left Ear: Tympanic membrane normal.     Nose: Nose normal.     Mouth/Throat:     Mouth: Mucous membranes are moist.     Pharynx: Oropharynx is clear.  Eyes:     Extraocular Movements: Extraocular movements intact.     Pupils: Pupils are equal, round, and reactive to light.  Neck:     Musculoskeletal: Normal range of motion and neck supple.  Cardiovascular:     Rate and Rhythm: Normal rate and regular rhythm.     Comments: NO leg swelling Pulmonary:     Effort: Pulmonary effort is normal.     Breath sounds: Normal breath sounds. No wheezing or rhonchi.     Comments: CTA Musculoskeletal: Normal range of motion.  Skin:    General: Skin is warm and dry.  Neurological:     General: No focal deficit present.     Mental Status: She is alert and oriented to person, place, and time. Mental status is at baseline.  Psychiatric:        Mood and Affect: Mood normal.        Behavior: Behavior normal.        Thought Content: Thought content normal.        Judgment: Judgment normal.      Lab Results:  CBC    Component Value Date/Time   WBC 7.4 09/25/2018 1156   RBC 4.45  09/25/2018 1156   HGB 14.5 09/25/2018 1156   HCT 42.0 09/25/2018 1156   PLT 310 09/25/2018 1156   MCV 94.4 09/25/2018 1156   MCH 32.6 09/25/2018 1156   MCHC 34.5 09/25/2018 1156   RDW 12.0 09/25/2018 1156   LYMPHSABS 3,352 09/25/2018 1156   MONOABS 0.8 12/07/2016 0644   EOSABS 252 09/25/2018 1156   BASOSABS 22 09/25/2018 1156    BMET    Component Value Date/Time   NA 140 09/25/2018 1156   K 4.0 09/25/2018 1156   CL 105 09/25/2018 1156   CO2 26 09/25/2018 1156   GLUCOSE 89 09/25/2018 1156   BUN 14 09/25/2018 1156   CREATININE 0.84 09/25/2018 1156   CALCIUM 9.3 09/25/2018 1156   GFRNONAA >60 12/08/2016 0608   GFRAA >60 12/08/2016 0608    BNP No results found for: BNP  ProBNP No results found for: PROBNP  Imaging: Dg Chest 2 View  Result Date: 09/25/2018 CLINICAL DATA:  Cough and chest congestion EXAM: CHEST - 2 VIEW COMPARISON:  September 23, 2014 FINDINGS: The heart size and mediastinal contours are within normal limits. Both lungs are clear. The visualized skeletal structures are unremarkable. IMPRESSION: No active cardiopulmonary disease. Electronically Signed   By: Abelardo Diesel M.D.   On: 09/25/2018 14:35     Assessment & Plan:   Bronchitis - Chronic bronchitis/asthma symptoms  - Completed two round of antibiotic therapy recently with no improvement. Holding off on further abx at this time - Re-start Spiriva, if this does not help I would consider adding ICS/LABA like Symbicort - CXR showed no active cardiopulmonary disese - Check sputum culture  Dyspnea on exertion - MTC showed 31% decline in FEV1 per test notes associated with coughing  - CBC with diff  showed eos absolute >200  - FENO 8   Next follow up with Dr. Loanne Drilling scheduled for Early Jan    Martyn Ehrich, NP 09/26/2018

## 2018-09-25 NOTE — Patient Instructions (Addendum)
  Orders: - CXR today - Labs (CBC, BMET, BNP) - Sputum culture - Flutter valve three times a day for congestion  RX: - Adding Singulair at bedtime (RX sent) - Re-start spiriva, take 2 puffs once daily (sample given and RX sent) - Albuterol rescue inhaler every 6 hours as needed for sob/wheezing - If rescue inhaler not effective then change to nebulizer form (refill sent) * Holding off on further antibiotics and steroids at this time  Follow-up: - Jan 8th at 9:30 with Dr. Loanne Drilling

## 2018-09-26 ENCOUNTER — Encounter: Payer: Self-pay | Admitting: Primary Care

## 2018-09-26 DIAGNOSIS — R0600 Dyspnea, unspecified: Secondary | ICD-10-CM | POA: Insufficient documentation

## 2018-09-26 DIAGNOSIS — R0609 Other forms of dyspnea: Secondary | ICD-10-CM | POA: Insufficient documentation

## 2018-09-26 LAB — CBC WITH DIFFERENTIAL/PLATELET
Absolute Monocytes: 474 cells/uL (ref 200–950)
BASOS ABS: 22 {cells}/uL (ref 0–200)
Basophils Relative: 0.3 %
EOS PCT: 3.4 %
Eosinophils Absolute: 252 cells/uL (ref 15–500)
HCT: 42 % (ref 35.0–45.0)
HEMOGLOBIN: 14.5 g/dL (ref 11.7–15.5)
Lymphs Abs: 3352 cells/uL (ref 850–3900)
MCH: 32.6 pg (ref 27.0–33.0)
MCHC: 34.5 g/dL (ref 32.0–36.0)
MCV: 94.4 fL (ref 80.0–100.0)
MONOS PCT: 6.4 %
MPV: 10.1 fL (ref 7.5–12.5)
NEUTROS ABS: 3300 {cells}/uL (ref 1500–7800)
Neutrophils Relative %: 44.6 %
PLATELETS: 310 10*3/uL (ref 140–400)
RBC: 4.45 10*6/uL (ref 3.80–5.10)
RDW: 12 % (ref 11.0–15.0)
TOTAL LYMPHOCYTE: 45.3 %
WBC: 7.4 10*3/uL (ref 3.8–10.8)

## 2018-09-26 LAB — BASIC METABOLIC PANEL
BUN: 14 mg/dL (ref 7–25)
CALCIUM: 9.3 mg/dL (ref 8.6–10.4)
CO2: 26 mmol/L (ref 20–32)
Chloride: 105 mmol/L (ref 98–110)
Creat: 0.84 mg/dL (ref 0.50–1.05)
GLUCOSE: 89 mg/dL (ref 65–99)
Potassium: 4 mmol/L (ref 3.5–5.3)
SODIUM: 140 mmol/L (ref 135–146)

## 2018-09-26 LAB — RESPIRATORY CULTURE OR RESPIRATORY AND SPUTUM CULTURE

## 2018-09-26 LAB — BRAIN NATRIURETIC PEPTIDE: Brain Natriuretic Peptide: 24 pg/mL (ref <100)

## 2018-09-26 NOTE — Assessment & Plan Note (Signed)
-   Chronic bronchitis/asthma symptoms  - Completed two round of antibiotic therapy recently with no improvement. Holding off on further abx at this time - Re-start Spiriva, if this does not help I would consider adding ICS/LABA like Symbicort - CXR showed no active cardiopulmonary disese - Check sputum culture

## 2018-09-26 NOTE — Assessment & Plan Note (Signed)
-   MTC showed 31% decline in FEV1 per test notes associated with coughing  - CBC with diff showed eos absolute >200  - FENO 8

## 2018-09-27 ENCOUNTER — Telehealth: Payer: Self-pay | Admitting: Primary Care

## 2018-09-27 ENCOUNTER — Ambulatory Visit: Payer: Managed Care, Other (non HMO) | Admitting: Primary Care

## 2018-09-27 NOTE — Progress Notes (Signed)
LVM for patient to return call regarding information below. Based on response received from Lazaro Arms, NP. Patient to get Symbicort 160/4.5 - sample available.

## 2018-09-27 NOTE — Progress Notes (Signed)
LVM for patient to return call regarding results and medication adjustment (change to Symbicort). See related result note dated 09/27/18 (9:01 time stamp).

## 2018-09-27 NOTE — Progress Notes (Signed)
Which Symbicort dosage? Was she supposed to stop any of her other medications?

## 2018-09-28 NOTE — Telephone Encounter (Signed)
Results called and given to Patient by Demetrio Lapping 09/27/18.  Nothing further at this time.

## 2018-09-28 NOTE — Progress Notes (Signed)
LVM yesterday morning for the patient to return call regarding test and medication. System did not keep notation made.

## 2018-10-02 NOTE — Progress Notes (Signed)
See note dated 09/28/18 patient called back and was given results.

## 2018-10-03 ENCOUNTER — Encounter: Payer: Self-pay | Admitting: Pulmonary Disease

## 2018-10-03 ENCOUNTER — Ambulatory Visit (INDEPENDENT_AMBULATORY_CARE_PROVIDER_SITE_OTHER): Payer: Managed Care, Other (non HMO) | Admitting: Pulmonary Disease

## 2018-10-03 VITALS — BP 116/64 | HR 89 | Temp 97.9°F | Wt 208.0 lb

## 2018-10-03 DIAGNOSIS — J3489 Other specified disorders of nose and nasal sinuses: Secondary | ICD-10-CM

## 2018-10-03 DIAGNOSIS — J4540 Moderate persistent asthma, uncomplicated: Secondary | ICD-10-CM

## 2018-10-03 MED ORDER — BUDESONIDE-FORMOTEROL FUMARATE 160-4.5 MCG/ACT IN AERO: 2.0000 | INHALATION_SPRAY | Freq: Two times a day (BID) | RESPIRATORY_TRACT | 0 refills | Status: DC

## 2018-10-03 MED ORDER — BUDESONIDE-FORMOTEROL FUMARATE 160-4.5 MCG/ACT IN AERO: 2.0000 | INHALATION_SPRAY | Freq: Two times a day (BID) | RESPIRATORY_TRACT | 3 refills | Status: DC

## 2018-10-03 MED ORDER — PREDNISONE 10 MG PO TABS: ORAL_TABLET | ORAL | 0 refills | Status: DC

## 2018-10-03 NOTE — Progress Notes (Signed)
Synopsis:  Referred in 06/2018 for chronic productive cough and shortness of breath. Methacholine challenge 07/2018 positive for hyperreactivity Moderate persistent asthma  Subjective:   PATIENT ID: [name] [name] GENDER: female DOB: 1966-02-23, MRN: [id number]   HPI  Chief Complaint  Patient presents with  . Follow-up    recent URI symptoms - has been seen here twice.  Off all antibiotics and pred now. gold colored phlegm   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  Since her last visit with me she has had 3 episodes of asthma exacerbation/sinusitis.  She was switched from Spiriva to Symbicort at the end of December.  However recent note comments that she was not on Symbicort or Spiriva.  She continues to have symptoms has been treated with multiple prednisone tapers and doxycycline and azithromycin.  She continues to have shortness of breath and productive cough.  Associated with chest tightness and difficulty with deep inspiration and intermittent wheezing.  Worsens with walking up a flight of stairs.  Feels her symptoms have severely limited her activity.  Albuterol improves her symptoms and she has to use it more than twice a day.  She does not think that recent steroids or antibiotics helped her symptoms.  No fevers, chills or chest pain.  2019 Jan Feb March April May June July Aug Sept Oct Nov Dec            X X X  2020 Feb Feb March April May June July Aug Sept Oct Nov Dec                  Social History: Non-smoker  Environmental exposures: No known exposures  I have personally reviewed patient's past medical/family/social history, allergies, current medications.  Past Medical History:  Diagnosis Date  . Adrenal gland hypofunction (HCC)    related to post op, vancomycin & lumbar  injection, steroids    . Arthritis    hnp-lumbar   . Blood dyscrasia    hx probable blood clot after ankle surgery  not definitive but treated per pt  . Cancer ([place])    "early evolvng melanoma "- legs   . Depression   . Diabetes insipidus ([place])   . Family history of adverse reaction to anesthesia    mom has N/V - zofran works  . Heart murmur    years ago-echo 05 no problems  . High cholesterol   . History of blood transfusion    "16 so far" (07/25/2012)  . History of bronchitis   . History of UTI   . Hypertension    no med for 1 yr  . 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 Migraines   . Peripheral vascular disease ([place])    ? dvt 2011 tx as preventive although did not show on scan after ankle surgery  . Pneumonia ~ 2009; 2010  . PONV (postoperative nausea and vomiting)    last 2 surgeries less nausea  . PONV (postoperative nausea and vomiting)    none with last surgery-be extra careful with nose patient haas septal hole      Family History  Problem Relation Age of Onset  . Hypertension Mother   . Coronary artery disease Mother   . Hypertension Father      Social History   Occupational History  . Not on file  Tobacco Use  .  Smoking status: Never Smoker  . Smokeless tobacco: Never Used  Substance and Sexual Activity  . Alcohol use: Yes    Alcohol/week: 2.0 standard drinks    Types: 2 Glasses of wine per week    Comment: occasional  . Drug use: No  . Sexual activity: Yes    Allergies  Allergen Reactions  . Amoxicillin Anaphylaxis, Hives and Itching  . Black Walnut Pollen Allergy Skin Test Anaphylaxis  . Penicillins Anaphylaxis    Has patient had a PCN reaction causing immediate rash, facial/tongue/throat swelling, SOB or lightheadedness with hypotension: Yes Has patient had a PCN reaction causing severe rash involving mucus membranes or skin necrosis: No Has patient had a PCN reaction that required hospitalization Yes Has patient had a PCN reaction occurring  within the last 10 years: No If all of the above answers are "NO", then may proceed with Cephalosporin use.   . Sulfa Antibiotics Hives and Other (See Comments)    Hives, mouth blisters   . Chloroquine Other (See Comments)    Renal dysfunction  . Ciprofloxacin Hives  . Phenergan [Promethazine Hcl] Other (See Comments)    Caused "severe drop in blood pressure."  . Sulfamethoxazole Hives  . Other Other (See Comments)    From allergy testing--Canteloupe  . Shellfish Allergy Itching and Rash  . Vancomycin Cross Reactors Other (See Comments)    "RED MAN SYNDROME"  Pt stated she had no reaction to Vancomycin when it is given slow     Outpatient Medications Prior to Visit  Medication Sig Dispense Refill  . ADDERALL XR 20 MG 24 hr capsule Take 20 mg by mouth daily as needed (concentration).   0  . desmopressin (DDAVP) 0.1 MG tablet Take 0.1 mg by mouth 2 (two) times daily.    Marland Kitchen estradiol (CLIMARA - DOSED IN MG/24 HR) 0.0375 mg/24hr patch Place 0.0375 mg onto the skin once a week. saturdays    . ipratropium (ATROVENT) 0.02 % nebulizer solution Take 2.5 mLs (0.5 mg total) by nebulization every 6 (six) hours as needed for wheezing or shortness of breath. icd- J42 75 mL 12  . montelukast (SINGULAIR) 10 MG tablet Take 1 tablet (10 mg total) by mouth at bedtime. 30 tablet 11  . OVER THE COUNTER MEDICATION Take 6 tablets by mouth daily. Smarty Pants Vitamins    . PROAIR HFA 108 (90 Base) MCG/ACT inhaler     . Probiotic CAPS Take 1 capsule by mouth daily.     Marland Kitchen Respiratory Therapy Supplies (FLUTTER) DEVI Use flutter valve 3 times a day 1 each 0  . rosuvastatin (CRESTOR) 20 MG tablet Take 20 mg by mouth daily.    . Tiotropium Bromide Monohydrate (SPIRIVA RESPIMAT) 1.25 MCG/ACT AERS Inhale 2 puffs into the lungs daily. 1 Inhaler 5  . Tiotropium Bromide Monohydrate (SPIRIVA RESPIMAT) 1.25 MCG/ACT AERS Inhale 2 puffs into the lungs daily. 4 g 2  . topiramate (TOPAMAX) 50 MG tablet Take 150 mg by mouth  daily.    . valACYclovir (VALTREX) 1000 MG tablet Take two tablets as a single dose prn cold sore. (Patient taking differently: Take 2,000 mg by mouth daily as needed. Take two tablets as a single dose prn cold sore.) 20 tablet 1   No facility-administered medications prior to visit.     Review of Systems  Constitutional: Negative for chills, diaphoresis, fever, malaise/fatigue and weight loss.  HENT: Positive for congestion and ear pain. Negative for sore throat.   Eyes: Positive for blurred vision.  Respiratory: Positive for cough, sputum production and shortness of breath. Negative for hemoptysis and wheezing.   Cardiovascular: Negative for chest pain, orthopnea, leg swelling and PND.  Gastrointestinal: Positive for nausea. Negative for heartburn.  Musculoskeletal: Negative for myalgias.  Skin: Negative for rash.  Neurological: Negative for dizziness, weakness and headaches.  Endo/Heme/Allergies: Does not bruise/bleed easily.     Objective:   Vitals:   10/03/18 0940  BP: 116/64  Pulse: 89  Temp: 97.9 F (36.6 C)  SpO2: 97%  Weight: 208 lb (94.3 kg)   SpO2: 97 % O2 Device: None (Room air)  Physical Exam General: Well-appearing, no acute distress HENT: [place], AT, OP clear, MMM Eyes: EOMI, no scleral icterus Respiratory: Difficulty with deep inspiration. Clear to auscultation bilaterally.  No crackles, wheezing or rales Cardiovascular: RRR, -M/R/G, no JVD  Labs Eosinophil 252 (3.4%) 09/25/18  Chest imaging: CXR 09/25/2018-normal chest x-ray with clear lung fields without evidence of infiltrate, effusion or edema  PFT:  08/07/2018-Mild bronchial hyper reactivity suggestive of asthma  I have personally reviewed the above labs, images and tests noted above.    Assessment & Plan:   1. Moderate-severe persistent asthma, uncontrolled  Discussion: Will need to optimize inhaler regimen to include inhaled steroid. We discussed likely need for biologic add-on for asthma  management.  Recent peripheral eosinophils elevated >30 on recent CBC with diff; however it is predominantly neutrophilic so may represent Th2 type-low asthma.  --Prednisone taper --Add Symbicort 160-4.5 mcg 2 puffs twice daily --Continue Spiriva Respimat 1.25 mcg 2 puffs daily  --We will obtain IgE --We will base which biologic to start based on testing (Xolair versus Nucala) --Will refer to ENT to discuss sinus issues with hx of nasal septal perforation  Orders Placed This Encounter  Procedures  . IgE    Standing Status:   Future    Number of Occurrences:   1    Standing Expiration Date:   10/03/2019  . Ambulatory referral to ENT    Referral Priority:   Routine    Referral Type:   Consultation    Referral Reason:   Specialty Services Required    Requested Specialty:   Otolaryngology    Number of Visits Requested:   1   Meds ordered this encounter  Medications  . predniSONE (DELTASONE) 10 MG tablet    Sig: Take 6 tablets x three days (60 mg), followed by 5 tablets x three days (50mg ), then 4 tablets x three day(40mg ), then 3 tablets (30mg ) x three days, then 2 tablets (20mg ) x three days, then 1 tablet (10mg ) x three days, then STOP    Dispense:  60 tablet    Refill:  0  . budesonide-formoterol (SYMBICORT) 160-4.5 MCG/ACT inhaler    Sig: Inhale 2 puffs into the lungs 2 (two) times daily.    Dispense:  1 Inhaler    Refill:  3  . budesonide-formoterol (SYMBICORT) 160-4.5 MCG/ACT inhaler    Sig: Inhale 2 puffs into the lungs 2 (two) times daily.    Dispense:  1 Inhaler    Refill:  0    Order Specific Question:   Lot Number?    Answer:   [id number]    Order Specific Question:   Expiration Date?    Answer:   10/04/2019    Order Specific Question:   Manufacturer?    Answer:   [employer] [[id number]]    Order Specific Question:   Quantity    Answer:   1    Return in  about 3 months (around 01/02/2019).  Maybel Dambrosio Rodman Pickle, MD [facility] 10/03/2018 10:42 AM  Personal  pager: [phone] If unanswered, please page CCM On-call: [phone]

## 2018-10-03 NOTE — Patient Instructions (Addendum)
Moderate-Severe Persistent Asthma, uncontrolled  Will need to optimize inhaler regimen to include inhaled steroid. We discussed likely need for biologic add-on for asthma management.  Recent peripheral eosinophils elevated >30 on recent CBC with diff; however it is predominantly neutrophilic so may represent Th2 type-low asthma.  --Add Symbicort 160-4.5 mcg 2 puffs twice daily --Continue Spiriva Respimat 1.25 mcg 2 puffs daily  --We will obtain IgE --We will base which biologic to start based on testing (Xolair versus Nucala)

## 2018-10-04 LAB — IGE: IgE (Immunoglobulin E), Serum: 10 kU/L (ref <=114)

## 2018-10-08 DIAGNOSIS — J3489 Other specified disorders of nose and nasal sinuses: Secondary | ICD-10-CM | POA: Diagnosis not present

## 2018-10-08 DIAGNOSIS — Z7289 Other problems related to lifestyle: Secondary | ICD-10-CM | POA: Diagnosis not present

## 2018-10-08 DIAGNOSIS — K219 Gastro-esophageal reflux disease without esophagitis: Secondary | ICD-10-CM | POA: Diagnosis not present

## 2018-10-09 ENCOUNTER — Telehealth: Payer: Self-pay | Admitting: Pulmonary Disease

## 2018-10-09 NOTE — Telephone Encounter (Signed)
Contacted patient to inform her that Dr. Loanne Drilling reports the following:  Please contact patient. Lab draw (IgE) is normal.  Patient to keep follow up appointment and call if she needs anything sooner. Patient acknowledged understanding.

## 2018-10-31 ENCOUNTER — Encounter: Payer: Self-pay | Admitting: Pulmonary Disease

## 2018-10-31 ENCOUNTER — Ambulatory Visit (INDEPENDENT_AMBULATORY_CARE_PROVIDER_SITE_OTHER): Payer: BLUE CROSS/BLUE SHIELD | Admitting: Pulmonary Disease

## 2018-10-31 VITALS — BP 112/74 | HR 86 | Ht 69.0 in | Wt 208.0 lb

## 2018-10-31 DIAGNOSIS — J42 Unspecified chronic bronchitis: Secondary | ICD-10-CM | POA: Diagnosis not present

## 2018-10-31 DIAGNOSIS — Z7952 Long term (current) use of systemic steroids: Secondary | ICD-10-CM

## 2018-10-31 DIAGNOSIS — J4551 Severe persistent asthma with (acute) exacerbation: Secondary | ICD-10-CM

## 2018-10-31 DIAGNOSIS — R0609 Other forms of dyspnea: Secondary | ICD-10-CM

## 2018-10-31 DIAGNOSIS — R0600 Dyspnea, unspecified: Secondary | ICD-10-CM

## 2018-10-31 MED ORDER — PREDNISONE 10 MG PO TABS: ORAL_TABLET | ORAL | 0 refills | Status: DC

## 2018-10-31 MED ORDER — BUDESONIDE-FORMOTEROL FUMARATE 160-4.5 MCG/ACT IN AERO: 2.0000 | INHALATION_SPRAY | Freq: Two times a day (BID) | RESPIRATORY_TRACT | 6 refills | Status: DC

## 2018-10-31 MED ORDER — TIOTROPIUM BROMIDE MONOHYDRATE 1.25 MCG/ACT IN AERS: 2.0000 | INHALATION_SPRAY | Freq: Every day | RESPIRATORY_TRACT | 6 refills | Status: DC

## 2018-10-31 NOTE — Progress Notes (Signed)
Synopsis:  Referred in 06/2018 for chronic productive cough and shortness of breath. Methacholine challenge 07/2018 consistent with asthma  Subjective:   PATIENT ID: [name] [name] GENDER: female DOB: [birth year], MRN: [id number]   HPI  Chief Complaint  Patient presents with  . Follow-up    reports breathing is much improved but cough is increasing again x5 days with yellow mucus, has been off prednisone about 10days.  doing well with Symbicort   [name] is a 53 year old female never smoker with adrenal insufficiency, history of pituitary adenoma status post resection complicated by nasal septal necrosis who presents for follow-up for moderate persistent asthma.  Since her last visit with me, she has completed prednisone taper and she felt that she did not use her inhaler as often and also started using her treadmilll. However once she finished the medication, she has recurrent symptoms of shortness of breath and productive cough with clear/yellow sputum. Intermittent wheezing was less frequent. Does report associated malaise, dizziness and change in vision (white spots) with exertion which did not occur when she was taking steroids. Albuterol improves her symptoms and uses it daily. Seen by ENT who recommended conservative management - continue to allergy meds, netti pot and to avoid nasal sprays.  Of note, her father recently diagnosed with small cell carcinoma of the lung and is in the OR today for what sounds like a wedge resection with plan for chemotherapy to follow. She is tearful and concerned that she upset her mother is a retired Multimedia programmer who has been involved with her father's care.   ACT: 15   2019 Jan Feb March April May June July Aug Sept Oct Nov Dec            X X X  2020 Jan Feb March April May June July Aug Sept Oct Nov Dec   X               Social History: Non-smoker  Environmental exposures: No known exposures  I have personally reviewed patient's  past medical/family/social history, allergies, current medications.  Past Medical History:  Diagnosis Date  . 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 Arthritis    hnp-lumbar   . Blood dyscrasia    hx probable blood clot after ankle surgery  not definitive but treated per pt  . Cancer ([place])    "early evolvng melanoma "- legs   . Depression   . Diabetes insipidus ([place])   . Family history of adverse reaction to anesthesia    mom has N/V - zofran works  . Heart murmur    years ago-echo 05 no problems  . High cholesterol   . History of blood transfusion    "16 so far" (07/25/2012)  . History of bronchitis   . History of UTI   . Hypertension    no med for 1 yr  . 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 Migraines   . Peripheral vascular disease ([place])    ? dvt 2011 tx as preventive although did not show on scan after ankle surgery  . Pneumonia ~ 2009; 2010  . PONV (postoperative nausea and vomiting)    last 2 surgeries less nausea  . PONV (postoperative nausea and vomiting)    none with last surgery-be extra careful with nose patient haas septal hole      Family History  Problem Relation Age of Onset  .  Hypertension Mother   . Coronary artery disease Mother   . Hypertension Father      Social History   Occupational History  . Not on file  Tobacco Use  . Smoking status: Never Smoker  . Smokeless tobacco: Never Used  Substance and Sexual Activity  . Alcohol use: Yes    Alcohol/week: 2.0 standard drinks    Types: 2 Glasses of wine per week    Comment: occasional  . Drug use: No  . Sexual activity: Yes    Allergies  Allergen Reactions  . Amoxicillin Anaphylaxis, Hives and Itching  . Black Walnut Pollen Allergy Skin Test Anaphylaxis  . Penicillins Anaphylaxis    Has patient had a PCN reaction causing immediate rash, facial/tongue/throat swelling, SOB or lightheadedness with hypotension: Yes Has patient had a  PCN reaction causing severe rash involving mucus membranes or skin necrosis: No Has patient had a PCN reaction that required hospitalization Yes Has patient had a PCN reaction occurring within the last 10 years: No If all of the above answers are "NO", then may proceed with Cephalosporin use.   . Sulfa Antibiotics Hives and Other (See Comments)    Hives, mouth blisters   . Chloroquine Other (See Comments)    Renal dysfunction  . Ciprofloxacin Hives  . Phenergan [Promethazine Hcl] Other (See Comments)    Caused "severe drop in blood pressure."  . Sulfamethoxazole Hives  . Other Other (See Comments)    From allergy testing--Canteloupe  . Shellfish Allergy Itching and Rash  . Vancomycin Cross Reactors Other (See Comments)    "RED MAN SYNDROME"  Pt stated she had no reaction to Vancomycin when it is given slow     Outpatient Medications Prior to Visit  Medication Sig Dispense Refill  . desmopressin (DDAVP) 0.1 MG tablet Take 0.1 mg by mouth 2 (two) times daily.    Marland Kitchen estradiol (CLIMARA - DOSED IN MG/24 HR) 0.0375 mg/24hr patch Place 0.0375 mg onto the skin once a week. saturdays    . ipratropium (ATROVENT) 0.02 % nebulizer solution Take 2.5 mLs (0.5 mg total) by nebulization every 6 (six) hours as needed for wheezing or shortness of breath. icd- J42 75 mL 12  . montelukast (SINGULAIR) 10 MG tablet Take 1 tablet (10 mg total) by mouth at bedtime. 30 tablet 11  . OVER THE COUNTER MEDICATION Take 6 tablets by mouth daily. Smarty Pants Vitamins    . PROAIR HFA 108 (90 Base) MCG/ACT inhaler     . Probiotic CAPS Take 1 capsule by mouth daily.     Marland Kitchen Respiratory Therapy Supplies (FLUTTER) DEVI Use flutter valve 3 times a day 1 each 0  . rosuvastatin (CRESTOR) 20 MG tablet Take 20 mg by mouth daily.    Marland Kitchen topiramate (TOPAMAX) 50 MG tablet Take 150 mg by mouth daily.    . valACYclovir (VALTREX) 1000 MG tablet Take two tablets as a single dose prn cold sore. (Patient taking differently: Take 2,000  mg by mouth daily as needed. Take two tablets as a single dose prn cold sore.) 20 tablet 1  . budesonide-formoterol (SYMBICORT) 160-4.5 MCG/ACT inhaler Inhale 2 puffs into the lungs 2 (two) times daily. 1 Inhaler 3  . predniSONE (DELTASONE) 10 MG tablet Take 6 tablets x three days (60 mg), followed by 5 tablets x three days (50mg ), then 4 tablets x three day(40mg ), then 3 tablets (30mg ) x three days, then 2 tablets (20mg ) x three days, then 1 tablet (10mg ) x three days, then STOP  60 tablet 0  . Tiotropium Bromide Monohydrate (SPIRIVA RESPIMAT) 1.25 MCG/ACT AERS Inhale 2 puffs into the lungs daily. 1 Inhaler 5  . Tiotropium Bromide Monohydrate (SPIRIVA RESPIMAT) 1.25 MCG/ACT AERS Inhale 2 puffs into the lungs daily. 4 g 2  . ADDERALL XR 20 MG 24 hr capsule Take 20 mg by mouth daily as needed (concentration).   0  . budesonide-formoterol (SYMBICORT) 160-4.5 MCG/ACT inhaler Inhale 2 puffs into the lungs 2 (two) times daily. 1 Inhaler 0   No facility-administered medications prior to visit.     Review of Systems  Constitutional: Positive for malaise/fatigue. Negative for chills, diaphoresis and fever.  HENT: Positive for congestion. Negative for sinus pain and sore throat.   Eyes: Positive for blurred vision. Negative for pain, discharge and redness.  Respiratory: Positive for cough, sputum production and shortness of breath. Negative for hemoptysis and wheezing.   Cardiovascular: Positive for orthopnea. Negative for chest pain, leg swelling and PND.  Gastrointestinal: Negative for heartburn.  Genitourinary: Negative for urgency.  Musculoskeletal: Negative for myalgias.  Skin: Negative for rash.  Neurological: Positive for dizziness and weakness. Negative for loss of consciousness and headaches.  Psychiatric/Behavioral: Positive for depression. The patient is nervous/anxious.      Objective:   Vitals:   10/31/18 0914  BP: 112/74  Pulse: 86  SpO2: 98%  Weight: 208 lb (94.3 kg)  Height:  5\' 9"  (1.753 m)   SpO2: 98 % O2 Device: None (Room air)  Physical Exam: General: Well-appearing, no acute distress, tearful HENT: [place], AT, OP clear, MMM Eyes: EOMI, no scleral icterus Respiratory: Mild inspiratory wheeze.  No crackles, wheezing or rales Cardiovascular: RRR, -M/R/G, no JVD GI: BS+, soft, nontender Extremities:-Edema,-tenderness Neuro: AAO x4, CNII-XII grossly intact Skin: Intact, no rashes or bruising Psych: Depressed mood, tearful  Labs Eosinophil 252 (3.4%) 09/25/18  Chest imaging: CXR 09/25/2018-normal chest x-ray with clear lung fields without evidence of infiltrate, effusion or edema  PFT:  08/07/2018-Mild bronchial hyper reactivity suggestive of asthma  I have personally reviewed the above labs, images and tests noted above.    Assessment & Plan:   Severe Persistent Asthma with acute exacerbation  Steroid dependent asthma with exacerbation on this clinic visit. On LABA/ICS and LAMA. We discussed likely need for biologic add-on for asthma management. Peripheral eosinophils on CBC 252  --Will initiate enrollment of Nucala --CONTINUE Symbicort 160-4.5 mcg 2 puffs twice daily --CONTINUE Spiriva Respimat 1.25 mcg 2 puffs daily  --Refilled inhalers during this visit  Follow-up in 3 months  No orders of the defined types were placed in this encounter.  Meds ordered this encounter  Medications  . budesonide-formoterol (SYMBICORT) 160-4.5 MCG/ACT inhaler    Sig: Inhale 2 puffs into the lungs 2 (two) times daily.    Dispense:  1 Inhaler    Refill:  6  . Tiotropium Bromide Monohydrate (SPIRIVA RESPIMAT) 1.25 MCG/ACT AERS    Sig: Inhale 2 puffs into the lungs daily.    Dispense:  1 Inhaler    Refill:  6    Order Specific Question:   Lot Number?    Answer:   [id number]    Order Specific Question:   Expiration Date?    Answer:   11/25/2018    Order Specific Question:   Quantity    Answer:   1  . predniSONE (DELTASONE) 10 MG tablet    Sig: Take 6 tablets x  three days (60 mg), followed by 5 tablets x three days (50mg ), then 4 tablets  x three day(40mg ), then 3 tablets (30mg ) x three days, then 2 tablets (20mg ) x three days, then 1 tablet (10mg ) x three days, then STOP    Dispense:  60 tablet    Refill:  0    Return in about 3 months (around 01/29/2019).  Avinash Maltos Rodman Pickle, MD [facility] 10/31/2018 10:20 AM  Personal pager: [phone] If unanswered, please page CCM On-call: [phone]

## 2018-10-31 NOTE — Patient Instructions (Addendum)
Severe Persistent Asthma with acute exacerbation  Steroid dependent asthma with exacerbation on this clinic visit. On optimal inhaler regimen. We discussed likely need for biologic add-on for asthma management. Peripheral eosinophils on CBC 252 (09/25/18)  --Will initiate enrollment of Nucala --ORDERED Prednisone taper --CONTINUE Symbicort 160-4.5 mcg 2 puffs twice daily --CONTINUE Spiriva Respimat 1.25 mcg 2 puffs daily  --Refilled inhalers during this visit  Follow-up in 3 months

## 2018-11-02 ENCOUNTER — Telehealth: Payer: Self-pay | Admitting: Pulmonary Disease

## 2018-11-05 NOTE — Telephone Encounter (Signed)
Routed to Tammy S to follow up

## 2018-11-05 NOTE — Telephone Encounter (Signed)
The form was marked PFS. I'll call to confirm. [phone]) The rep saw the form of Nucala had been selected. She said they needed to confirm our fax #. I took care of that.  Will leave encounter open, waiting on summary of benefits.

## 2018-11-07 NOTE — Telephone Encounter (Signed)
Tammy please advise on update, thank you.

## 2018-11-12 NOTE — Telephone Encounter (Signed)
P/A form should have been sent to [place] when [employer] sent pt's approval for the [place] to [facility]. It however was not. Call [employer], the rep said the rep that sent [place] the fax did not include P/A form. So the one I spoke today sent it. Once I get the form filled out, I'll fax it.

## 2018-11-12 NOTE — Telephone Encounter (Signed)
[name] [name], any updates? 

## 2018-11-14 NOTE — Telephone Encounter (Signed)
[name] [name], was the form ever received?

## 2018-11-15 NOTE — Telephone Encounter (Signed)
Message previously routed to [place], to follow up

## 2018-11-16 NOTE — Telephone Encounter (Signed)
PA started via phone for Nucala injections. [phone] fax all clinicals to them-Ov notes, labs,etc.  [id number] Case ID #. Urgent PA request is 24 to 48 hours.   All clinicals has been sent to fax number above.  Will wait for a decision.

## 2018-11-16 NOTE — Telephone Encounter (Signed)
Richard from [employer] said that the [place] needs a prior approval we need to call [place] [facility] [place]

## 2018-11-16 NOTE — Telephone Encounter (Signed)
Message will be routed to the [facility] injection pool. 

## 2018-11-26 NOTE — Telephone Encounter (Signed)
Patient has gotten approval for the Nucala injection.  Would like to schedule injection.  Patient phone number is [phone].

## 2018-11-28 NOTE — Telephone Encounter (Signed)
Forwarding to TS to f/u on, thanks

## 2018-11-29 NOTE — Telephone Encounter (Signed)
Called and spoke with [employer] of [place]. Spoke with Anne Ng D and was told that this medication has been denied as of 11/27/2018. Denial states that this medication is not medically necessary.  Called and spoke with pt. States that she received letters from her insurance company saying that this had been approved. She also received a letter from [employer] to [place] stating that they would cover up to $15,000 of expenses for her. I have asked the pt to bring these letters to our office so that we may have a copy of them. She agreed and will bring them by tomorrow.

## 2018-11-30 NOTE — Telephone Encounter (Signed)
Patient to bring letters about Nucala  to office today.  Will route to [employer]

## 2018-12-04 NOTE — Telephone Encounter (Signed)
Per protocol from LL will route this over to injection pool.  

## 2018-12-07 NOTE — Telephone Encounter (Signed)
I have located the pt's Nucala folder. There aren't any letters in the pt's folder. I attempted to contact the pt to follow up on this matter but the call would not go through. Will try again later today.

## 2018-12-10 NOTE — Telephone Encounter (Signed)
Please advise on update, thank you.

## 2018-12-11 IMAGING — MR MR LUMBAR SPINE W/O CM
4 of 5 series · 25 of 48 positions shown · non-contrast
Comparison: 11/27/2015

CLINICAL DATA: Chronic low back pain for many years. Lifting injury
0654. Multiple previous lumbar surgery. Pain radiates to the right
leg.

EXAM:
MRI LUMBAR SPINE WITHOUT CONTRAST
TECHNIQUE: Multiplanar, multisequence MR imaging of the lumbar spine was
performed. No intravenous contrast was administered.

[Series 3: T2 · sagittal · 4.0mm · 0.55mm/px · 6 of 14 slices shown (1 of 2)]
[im 1/14]
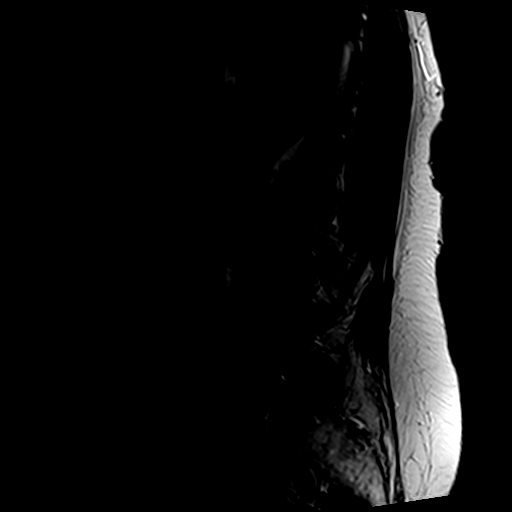
[im 3/14]
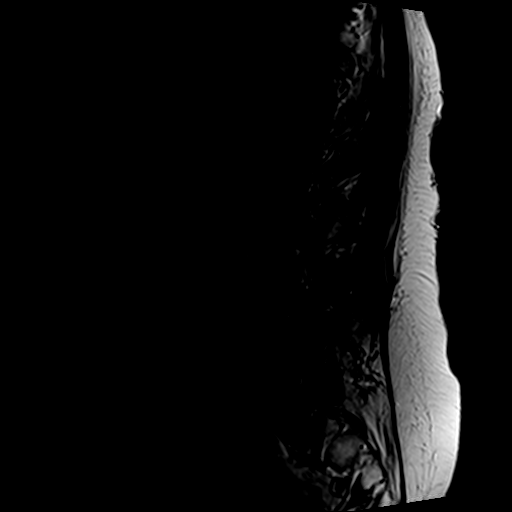
[im 6/14]
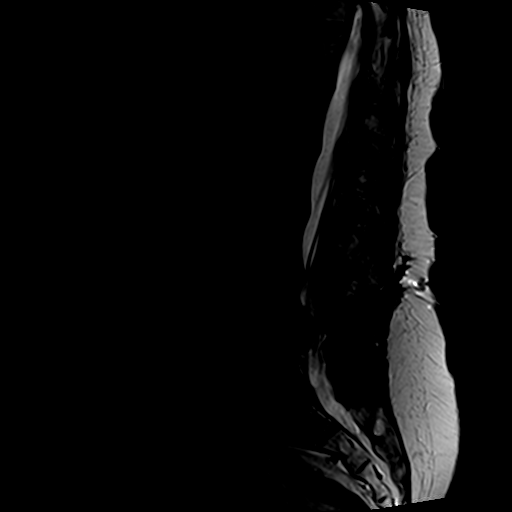
[im 8/14]
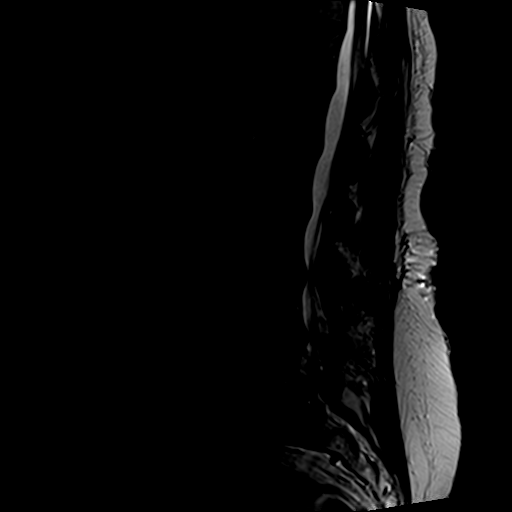
[im 11/14]
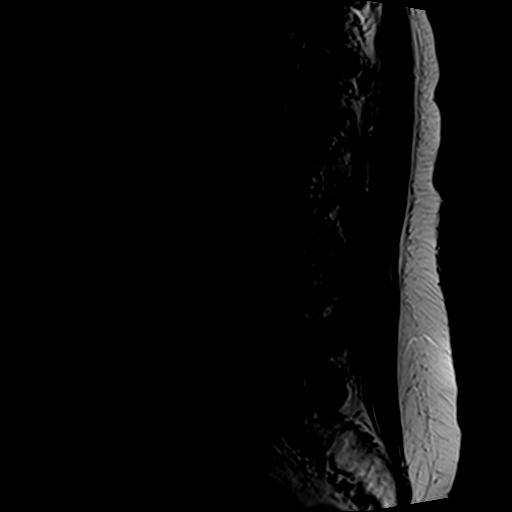
[im 14/14]
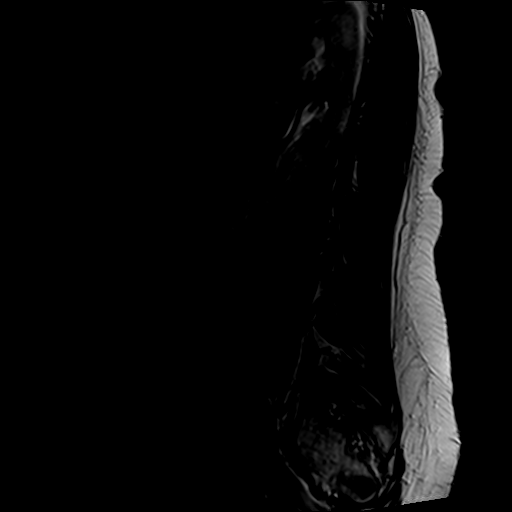

[Series 4: T1 · sagittal · 4.0mm · 0.55mm/px · 6 of 14 slices shown (1 of 2)]
[im 1/14]
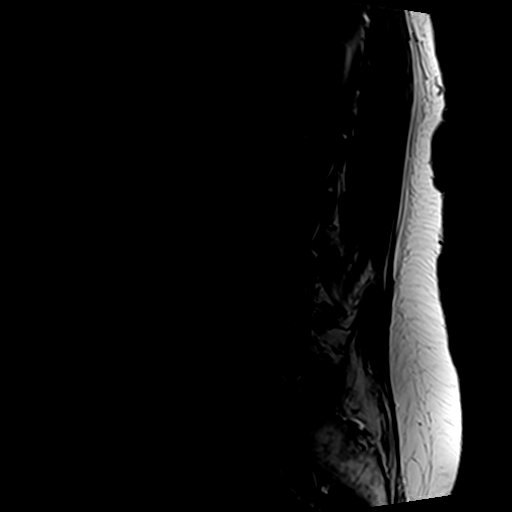
[im 3/14]
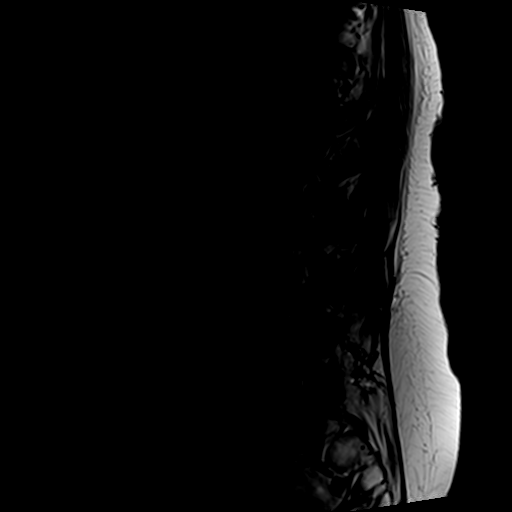
[im 6/14]
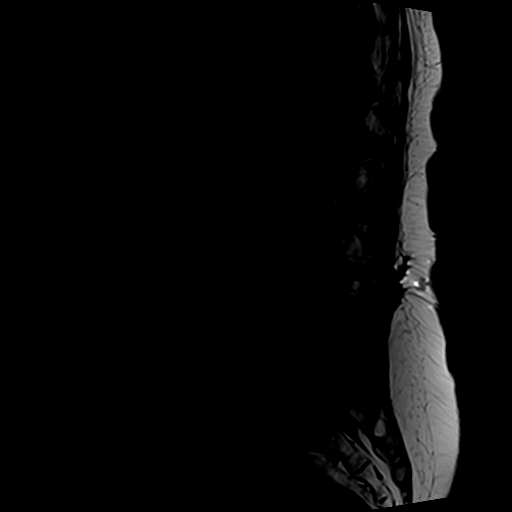
[im 8/14]
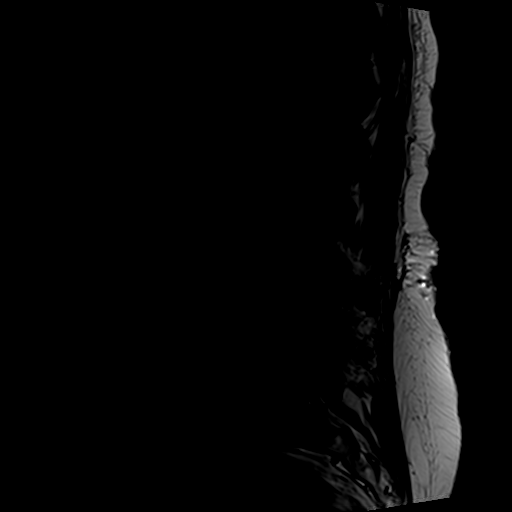
[im 11/14]
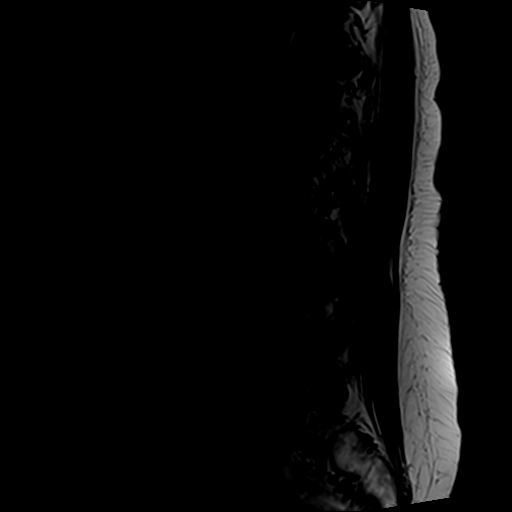
[im 14/14]
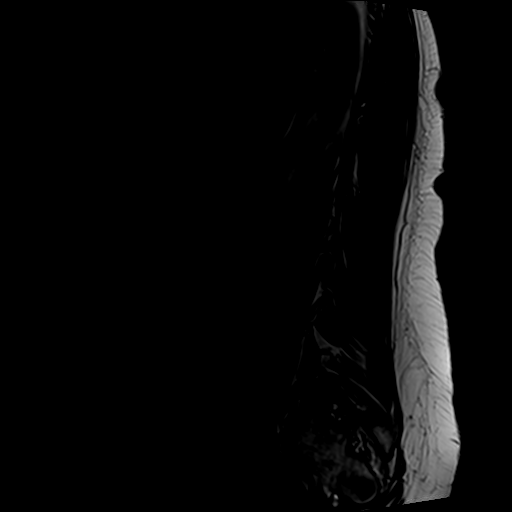

[Series 6: T2 · axial · 4.0mm · 0.70mm/px · z∈[-54,+124]mm · 9 of 33 slices shown (2 of 2)]
[im 1/33]
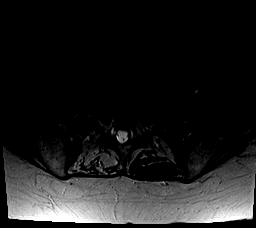
[im 5/33]
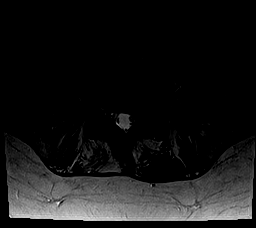
[im 10/33]
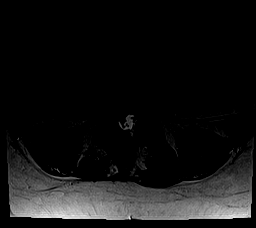
[im 14/33]
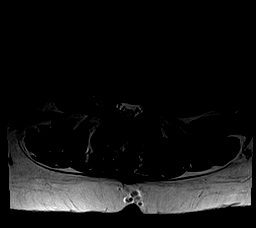
[im 17/33]
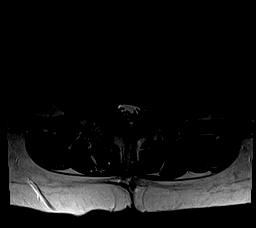
[im 19/33]
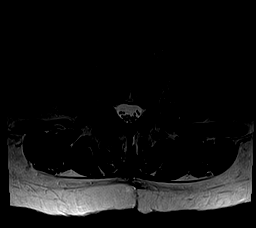
[im 23/33]
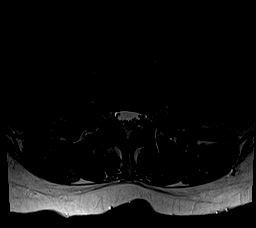
[im 28/33]
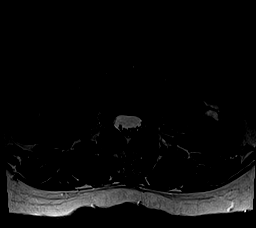
[im 33/33]
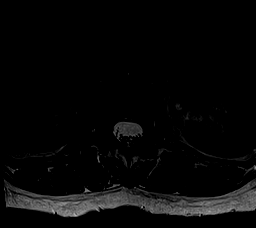

[Series 7: T1 · axial · 4.0mm · 0.35mm/px · z∈[-54,+99]mm · 4 of 33 slices shown (2 of 2)]
[im 1/33]
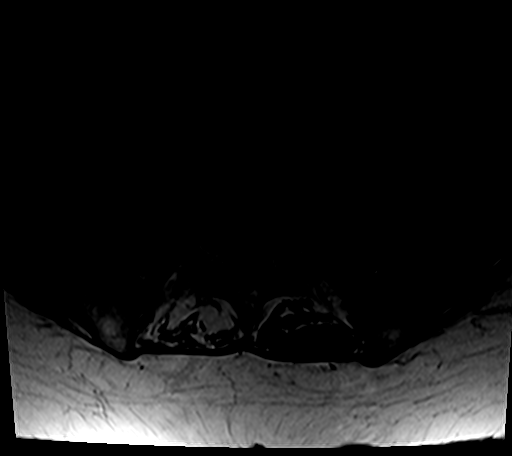
[im 5/33]
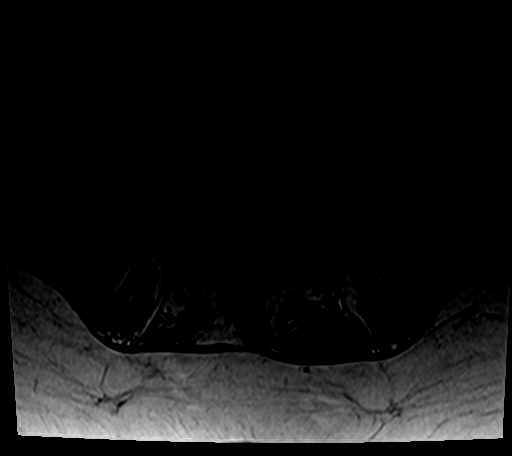
[im 17/33]
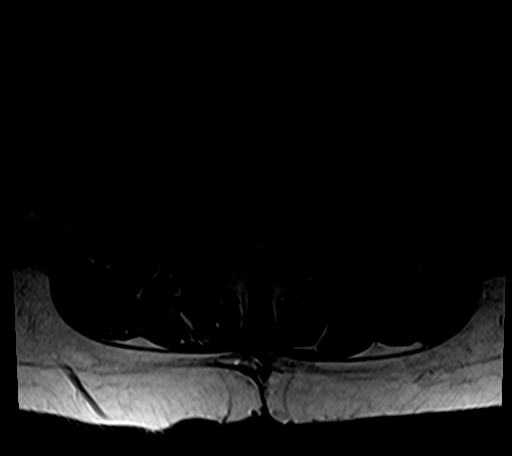
[im 28/33]
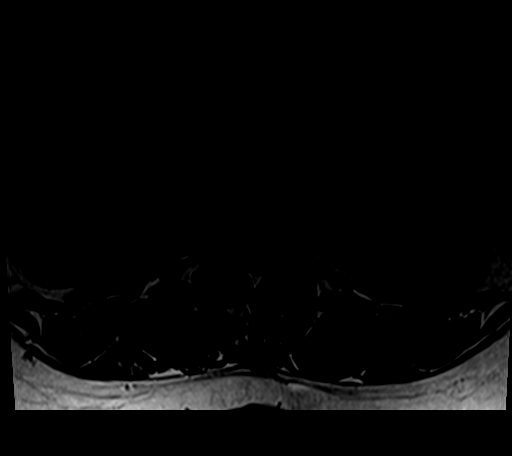

[25 of 48 positions shown; findings below may reference images not displayed]

FINDINGS: Segmentation:  5 lumbar type vertebral bodies.

Alignment:  Normal

Vertebrae:  No fracture or primary bone lesion.

Conus medullaris: Extends to the T12-L1 level and appears normal.

Paraspinal and other soft tissues: Negative

Disc levels:

No significant finding at T12-L1, L1-2 or L2-3.

L3-4: Circumferential bulging of the disc, with focal extrusion in
the left foraminal to extraforaminal region. Facet and ligamentous
hypertrophy. Previous partial left hemilaminectomy suspected.
Stenosis of the lateral recesses and foramina left worse than right.
Neural compression could occur on the left at this level.

L4-5: Previous hemilaminectomy use. Recurrent right posterolateral
disc herniation with a fragment in the right lateral recess likely
to compress the right L5 nerve root. Mild foraminal encroachment on
the right could affect the L4 nerve root as well.

L5-S1: Shallow broad-based disc herniation more prominent towards
the left. Facet and ligamentous hypertrophy. Previous right
hemilaminectomy. Subarticular lateral recess narrowing on the left
could affect the S1 nerve root.

The only change since the previous study is at the L4-5 level.
IMPRESSION: Recurrent right posterolateral disc herniation at L4-5 with the disc
fragment in the right lateral recess likely to compress the right L5
nerve root. This encroaches mildly upon the intervertebral foramen
on the right as well.

L3-4: Previous posterior decompression. Disc bulge with more
prominent foraminal to extraforaminal extrusion on the left. Lateral
recess and foraminal narrowing left worse than right. Neural
compression could occur on the left. No change.

L5-S1: Previous posterior decompression. Shallow disc protrusion
more prominent in the left posterolateral direction. Facet and
ligamentous hypertrophy. Left lateral recess narrowing could affect
the S1 nerve root. No change.

## 2018-12-18 NOTE — Telephone Encounter (Signed)
Called and spoke with patient today regarding in need of documents Patient rec'd documents from [place] that they have approved her for $15,000 for all injections Pt would like to have the injections to be scheduled as soon as possible She states that [place] is still denying this medication for the patient The patient advised that she will drop the paperwork off today at the front door for review  Routing message to injection pool and [facility] for review.

## 2018-12-19 NOTE — Telephone Encounter (Signed)
Nucala letter received and given to [employer].

## 2018-12-20 DIAGNOSIS — Z01419 Encounter for gynecological examination (general) (routine) without abnormal findings: Secondary | ICD-10-CM | POA: Diagnosis not present

## 2018-12-20 NOTE — Telephone Encounter (Signed)
I put the pt's papers on the cart with the rest of them. Will route to injection poole.

## 2018-12-24 NOTE — Telephone Encounter (Signed)
Reviewed the letters that were dropped off by the pt. These letters were not from the insurance but from [place] [facility] [place] making her aware that she had been approved for their patient assistance program. An appeal will need to be done since the PA was denied through the pt's insurance.  Dr. Loanne Drilling - we will need a letter of medical necessity please. Thanks!

## 2018-12-25 NOTE — Telephone Encounter (Signed)
Will wait on advisement from [place].

## 2019-01-02 ENCOUNTER — Telehealth: Payer: Self-pay | Admitting: Pulmonary Disease

## 2019-01-02 ENCOUNTER — Encounter: Payer: Self-pay | Admitting: Pulmonary Disease

## 2019-01-02 ENCOUNTER — Ambulatory Visit: Payer: Managed Care, Other (non HMO) | Admitting: Pulmonary Disease

## 2019-01-02 NOTE — Telephone Encounter (Signed)
Appeal letter for [name] [name] now available (listed under medical necessity for epi-pen template).  Please send letter to patient's insurance.  Thank you JE

## 2019-01-02 NOTE — Telephone Encounter (Addendum)
Please see telephone encounter dated 11/02/2018 (encounter was closed).  I have printed the pt's appeal letter along with the pt's OV notes and labs. Contacted [employer] at [phone] to obtain the appeals department's fax number. Received a message that their office was closed today for inclement weather in their area. Will try back.

## 2019-01-02 NOTE — Telephone Encounter (Signed)
Patient came to office to drop off paperwork.  Patient dropped off Nucala paperwork for Dr Loanne Drilling.  Paperwork placed in Dr Loanne Drilling box  Message routed to Injection pool and [place]

## 2019-01-02 NOTE — Telephone Encounter (Signed)
Patient  brought paperwork for [name] [name] to review.  Patient stated she has always had insurance, and [facility] sent her a bill.  Patient included copies of insurance card [facility]).  Message routed to [facility] and [place] placed in Dr [facility] box

## 2019-01-03 NOTE — Telephone Encounter (Signed)
Anything need to be signed or action required?

## 2019-01-03 NOTE — Telephone Encounter (Signed)
Spoke with pt. Please see previous telephone encounter from 01/02/2019.

## 2019-01-03 NOTE — Telephone Encounter (Signed)
Retrieived paperwork from Dr. Cordelia Pen in box. One of the documents was a bill that she received from [facility] stating that she didn't have any insurance. This has been placed in the Liz's in box up front to follow up on. The other document was a letter that the pt received from her insurance letting her know that they had denied Nucala.  LMTCB x1 for pt to make her aware that we are appealing the denial of Nucala, see telephone encounter (01/02/2019).

## 2019-01-03 NOTE — Telephone Encounter (Signed)
Pt states she is returning [name] [name]'s call.

## 2019-01-03 NOTE — Telephone Encounter (Signed)
Spoke with pt. She is aware that we are currently processing an appeal on Nucala.  [employer] and spoke with [place]. Appeals Department fax number is [phone]. Appeal letter and all supporting documentation has been faxed. Will await appeal decision.

## 2019-01-07 NOTE — Telephone Encounter (Signed)
I did not see any mail in Dr. Cordelia Pen box this morning 01/07/19.

## 2019-01-08 NOTE — Telephone Encounter (Signed)
Called [employer] to follow up on the status of this appeal. Spoke with Elmyra Ricks. States that all of the information has been received that was sent on 01/03/2019. Appeal is invalid at this time due to the pt needing to sign an authorization form giving [place] permission to do the appeal on her behalf. Elmyra Ricks states that this authorization was mailed out to the pt on 01/03/2019. Once this is received by [facility] they will start the appeal process for Nucala.  LMTCB x1 for pt.

## 2019-01-08 NOTE — Telephone Encounter (Signed)
Pt brought this authorization form by the office. It has been faxed to [facility]. Await appeal decision.

## 2019-01-09 NOTE — Telephone Encounter (Signed)
Pt calling Ria Comment back about Nucala Please call her back when you can

## 2019-01-10 NOTE — Telephone Encounter (Signed)
LMTCB x1 for pt.  

## 2019-01-11 NOTE — Telephone Encounter (Signed)
LMTCB x2 for pt 

## 2019-01-14 NOTE — Telephone Encounter (Signed)
Any paperwork update rec'd for this patient?  Denise please advise. Thank you.

## 2019-01-14 NOTE — Telephone Encounter (Signed)
Looks like there were two things going on with this encounter.  One note 01/02/19 regarding Nucala appeal which looks like has been updated then this note regarding insurance/billing which was forwarded to Kathlee Nations on a couple of occasions so this encounter can be closed.

## 2019-01-15 NOTE — Telephone Encounter (Signed)
Called [employer] to check the status of this appeal. Spoke with Elmyra Ricks. States that the pt's appeal is still pending at this time. Appeal Reference #: [id number].  LMTCB x3 for pt.

## 2019-01-15 NOTE — Telephone Encounter (Signed)
Patient is returning [place] phone call. Patient phone number is [phone].

## 2019-01-16 NOTE — Telephone Encounter (Signed)
Spoke with pt. She is aware that this appeal is still pending. Pt was appreciative that I was still working hard on getting this approved for her.  Will continue to follow up.

## 2019-01-25 MED ORDER — EPINEPHRINE 0.3 MG/0.3ML IJ SOAJ: 0.3000 mg | Freq: Once | INTRAMUSCULAR | 11 refills | Status: DC

## 2019-01-25 NOTE — Telephone Encounter (Addendum)
Spoke with pt. Advised her that we also received the same letter for the appeal approval >> 01/22/2019 - 01/22/2020. Pt was very appreciative that we have worked so hard to get this approved for her. Explained our office policies about first injection appointments >> 2 hour wait, must have Epipen. Epipen rx has been sent in.  Called [facility] has been called to set up shipment. Spoke with Rodman Key. Rodman Key has been made aware that the appeal has been approved. He had to open a new referral as the one they currently have on file for Nucala was closed due to the PA being denied. This request has been sent to the pharmacist urgent. Will await a call back from [facility].

## 2019-01-25 NOTE — Telephone Encounter (Signed)
Patient states has been approved for the Nucala injection.  Received approval letter from [facility].  Would like to schedule appointment for the Nucala injection.  Patient phone number is [phone].

## 2019-01-29 NOTE — Telephone Encounter (Signed)
Called [facility] to follow up on this matter. Spoke with Raquel Sarna. She states that the pharmacy is still currently processing this order. Nothing further is needed from [place]. Will leave message open to follow up on.

## 2019-01-30 ENCOUNTER — Encounter: Payer: Self-pay | Admitting: Nurse Practitioner

## 2019-01-30 ENCOUNTER — Telehealth: Payer: Self-pay | Admitting: Pulmonary Disease

## 2019-01-30 ENCOUNTER — Ambulatory Visit (INDEPENDENT_AMBULATORY_CARE_PROVIDER_SITE_OTHER): Payer: BLUE CROSS/BLUE SHIELD | Admitting: Nurse Practitioner

## 2019-01-30 ENCOUNTER — Other Ambulatory Visit: Payer: Self-pay

## 2019-01-30 DIAGNOSIS — J0100 Acute maxillary sinusitis, unspecified: Secondary | ICD-10-CM | POA: Diagnosis not present

## 2019-01-30 MED ORDER — PREDNISONE 10 MG PO TABS: ORAL_TABLET | ORAL | 0 refills | Status: DC

## 2019-01-30 MED ORDER — AZITHROMYCIN 250 MG PO TABS: ORAL_TABLET | ORAL | 0 refills | Status: DC

## 2019-01-30 NOTE — Patient Instructions (Signed)
Sinusitis: Will order prednisone taper Will order azithromycin  Hydrate  Drink enough water to keep your pee (urine) clear or pale yellow. Rest  Rest as much as possible.  Sleep with your head raised (elevated).  Make sure to get enough sleep each night. Other instructions  Put a warm, moist washcloth on your face 3-4 times a day or as told by your doctor. This will help with discomfort.  Wash your hands often with soap and water. If there is no soap and water, use hand sanitizer.  Do not smoke. Avoid being around people who are smoking (secondhand smoke). Call the office if:  You have a fever.  Your symptoms get worse.  Your symptoms do not get better within 10 days.   --Nucala has been approved awaiting first injection --CONTINUE Symbicort 160-4.5 mcg 2 puffs twice daily --CONTINUE Spiriva Respimat 1.25 mcg 2 puffs daily   Follow up with Dr. Loanne Drilling in 4 months or sooner if needed

## 2019-01-30 NOTE — Telephone Encounter (Signed)
Primary Pulmonologist: JE Last office visit and with whom: 10/31/18 with JE What do we see them for (pulmonary problems): asthma Last OV assessment/plan: Severe Persistent Asthma with acute exacerbation  Steroid dependent asthma with exacerbation on this clinic visit. On optimal inhaler regimen. We discussed likely need for biologic add-on for asthma management. Peripheral eosinophils on CBC 252 (09/25/18)  --Will initiate enrollment of Nucala --ORDERED Prednisone taper --CONTINUE Symbicort 160-4.5 mcg 2 puffs twice daily --CONTINUE Spiriva Respimat 1.25 mcg 2 puffs daily  --Refilled inhalers during this visit  Follow-up in 3 months  Was appointment offered to patient (explain)?  No, due to covid19    Reason for call: Spoke with patient. She stated that she has been dealing with her allergies for the past few weeks. She has noticed that her nasal drainage ranges in color from clear to yellow and then green. She has a low grade fever. Increased fatigue. She also has a non-productive cough. She stated that she feels like her inhalers (Spiriva and Symbicort) are no longer working. She is still taking Singulair at night. Denies being around anyone with COVID19.   She wants to know if she can have something called in for her. [facility] [address]   Beth, please advise. Thanks!

## 2019-01-30 NOTE — Progress Notes (Signed)
Virtual Visit via Telephone Note  I connected with [name] [name] on 01/30/19 at  3:30 PM EDT by telephone and verified that I am speaking with the correct person using two identifiers.  Location: Patient: home  Provider: office   I discussed the limitations, risks, security and privacy concerns of performing an evaluation and management service by telephone and the availability of in person appointments. I also discussed with the patient that there may be a patient responsible charge related to this service. The patient expressed understanding and agreed to proceed.   History of Present Illness: 53 year old female smoker with moderate persistent asthma who is followed by Dr. Loanne Drilling.  Patient has a tele-visit today for an acute visit.  Patient complains today of nasal drainage for the past 2 weeks with sinus pressure and pain.  States for the past 2 days her nasal drainage has been green.  Over the past 2 days she has had cough, and chills.  She has had significant sinus congestion that is progressively worsening.  Is any wheezing.  Denies any significant shortness of breath.  Patient is compliant with Symbicort and Spiriva.  That has been approved for Nucala and is waiting for the medication to arrive so that she can start her injections. Denies f/c/s, n/v/d, hemoptysis, PND, leg swelling.    Observations/Objective: Labs Eosinophil 252 (3.4%) 09/25/18  Chest imaging: CXR 09/25/2018-normal chest x-ray with clear lung fields without evidence of infiltrate, effusion or edema  PFT:  08/07/2018-Mild bronchial hyper reactivity suggestive of asthma  Assessment and Plan: Patient complains today of nasal drainage for the past 2 weeks with sinus pressure and pain.  States for the past 2 days her nasal drainage has been green.  Over the past 2 days she has had cough, and chills.  She has had significant sinus congestion that is progressively worsening.  He has not been experiencing significant  shortness of breath or wheeze.  Patient Instructions  Sinusitis: Will order prednisone taper Will order azithromycin  Hydrate  Drink enough water to keep your pee (urine) clear or pale yellow. Rest  Rest as much as possible.  Sleep with your head raised (elevated).  Make sure to get enough sleep each night. Other instructions  Put a warm, moist washcloth on your face 3-4 times a day or as told by your doctor. This will help with discomfort.  Wash your hands often with soap and water. If there is no soap and water, use hand sanitizer.  Do not smoke. Avoid being around people who are smoking (secondhand smoke). Call the office if:  You have a fever.  Your symptoms get worse.  Your symptoms do not get better within 10 days.   --Nucala has been approved awaiting first injection --CONTINUE Symbicort 160-4.5 mcg 2 puffs twice daily --CONTINUE Spiriva Respimat 1.25 mcg 2 puffs daily     Follow Up Instructions:  Follow up with Dr. Loanne Drilling in 4 months or sooner if needed   I discussed the assessment and treatment plan with the patient. The patient was provided an opportunity to ask questions and all were answered. The patient agreed with the plan and demonstrated an understanding of the instructions.   The patient was advised to call back or seek an in-person evaluation if the symptoms worsen or if the condition fails to improve as anticipated.  I provided 23 minutes of non-face-to-face time during this encounter.   Fenton Foy, NP

## 2019-01-30 NOTE — Telephone Encounter (Signed)
Spoke with patient. She has been scheduled for a televisit at 3pm today with TN. Explained to her what a televisit was, she verbalized understanding. Nothing further needed at time of call.

## 2019-01-30 NOTE — Assessment & Plan Note (Signed)
Patient complains today of nasal drainage for the past 2 weeks with sinus pressure and pain.  States for the past 2 days her nasal drainage has been green.  Over the past 2 days she has had cough, and chills.  She has had significant sinus congestion that is progressively worsening.  He has not been experiencing significant shortness of breath or wheeze.  Patient Instructions  Sinusitis: Will order prednisone taper Will order azithromycin  Hydrate  Drink enough water to keep your pee (urine) clear or pale yellow. Rest  Rest as much as possible.  Sleep with your head raised (elevated).  Make sure to get enough sleep each night. Other instructions  Put a warm, moist washcloth on your face 3-4 times a day or as told by your doctor. This will help with discomfort.  Wash your hands often with soap and water. If there is no soap and water, use hand sanitizer.  Do not smoke. Avoid being around people who are smoking (secondhand smoke). Call the office if:  You have a fever.  Your symptoms get worse.  Your symptoms do not get better within 10 days.   --Nucala has been approved awaiting first injection --CONTINUE Symbicort 160-4.5 mcg 2 puffs twice daily --CONTINUE Spiriva Respimat 1.25 mcg 2 puffs daily   Follow up with Dr. Loanne Drilling in 4 months or sooner if needed

## 2019-01-30 NOTE — Telephone Encounter (Signed)
Please make her a tele visit apt with tonya for this afternoon

## 2019-02-04 DIAGNOSIS — I10 Essential (primary) hypertension: Secondary | ICD-10-CM | POA: Diagnosis not present

## 2019-02-04 DIAGNOSIS — R7301 Impaired fasting glucose: Secondary | ICD-10-CM | POA: Diagnosis not present

## 2019-02-04 DIAGNOSIS — E7849 Other hyperlipidemia: Secondary | ICD-10-CM | POA: Diagnosis not present

## 2019-02-04 DIAGNOSIS — Z0000 Encounter for general adult medical examination without abnormal findings: Secondary | ICD-10-CM | POA: Diagnosis not present

## 2019-02-04 MED ORDER — EPINEPHRINE 0.3 MG/0.3ML IJ SOAJ: 0.3000 mg | INTRAMUSCULAR | 11 refills | Status: DC | PRN

## 2019-02-04 NOTE — Telephone Encounter (Signed)
Received a fax that pt's Epipen has been denied. [facility] I was advised a specific manufacture that would be covered under the pt's insurance plan. Rx has been resubmitted.  Spoke with pt. I have made her aware of this information.  Called [facility] to follow up on pt's medication shipment. I was advised that the pt prescription still needs to be verified by the pharmacy. This still has not been taken care of. Voiced my frustration with the representative, we have been waiting on this since 01/25/2019. She states that she will mark this case urgent.

## 2019-02-05 ENCOUNTER — Ambulatory Visit: Payer: BLUE CROSS/BLUE SHIELD | Admitting: Pulmonary Disease

## 2019-02-05 DIAGNOSIS — K580 Irritable bowel syndrome with diarrhea: Secondary | ICD-10-CM | POA: Diagnosis not present

## 2019-02-05 DIAGNOSIS — K625 Hemorrhage of anus and rectum: Secondary | ICD-10-CM | POA: Diagnosis not present

## 2019-02-05 DIAGNOSIS — R635 Abnormal weight gain: Secondary | ICD-10-CM | POA: Diagnosis not present

## 2019-02-05 DIAGNOSIS — R194 Change in bowel habit: Secondary | ICD-10-CM | POA: Diagnosis not present

## 2019-02-07 NOTE — Telephone Encounter (Signed)
Spoke with pt. She is aware that we are still trying to get her medication. Pt was appreciative of all of my hard work and follows up with her.

## 2019-02-07 NOTE — Telephone Encounter (Signed)
Pt is returning call. Cb is [phone].

## 2019-02-07 NOTE — Telephone Encounter (Signed)
Called [facility] once again to follow up on this medication shipment. Spoke with Darryl. He told me that the prescription is still being processed. Advised him that this is my third time calling and being told this. He called the pharmacy is see what the problem was. Darryl states that the pharmacist he spoke to was currently working on getting the prescription to the "next level." I was instructed to call back in 24-48 hours to schedule shipment.  I called the pt to make her aware of what is going on at this time. LMTCB x1 for pt.

## 2019-02-08 DIAGNOSIS — R82998 Other abnormal findings in urine: Secondary | ICD-10-CM | POA: Diagnosis not present

## 2019-02-08 DIAGNOSIS — I10 Essential (primary) hypertension: Secondary | ICD-10-CM | POA: Diagnosis not present

## 2019-02-11 DIAGNOSIS — Z8249 Family history of ischemic heart disease and other diseases of the circulatory system: Secondary | ICD-10-CM | POA: Diagnosis not present

## 2019-02-11 DIAGNOSIS — E785 Hyperlipidemia, unspecified: Secondary | ICD-10-CM | POA: Diagnosis not present

## 2019-02-11 DIAGNOSIS — I10 Essential (primary) hypertension: Secondary | ICD-10-CM | POA: Diagnosis not present

## 2019-02-11 DIAGNOSIS — Z0000 Encounter for general adult medical examination without abnormal findings: Secondary | ICD-10-CM | POA: Diagnosis not present

## 2019-02-11 DIAGNOSIS — J42 Unspecified chronic bronchitis: Secondary | ICD-10-CM | POA: Diagnosis not present

## 2019-02-11 NOTE — Telephone Encounter (Signed)
Called [facility] and spoke with Judson Roch. States that according to their system, it's stating that Nucala is needing a prior authorization. I explained to her that we did an appeal and the medication was approved. Was advised that I would need to speak to someone in their insurance verification department. I was transferred but the line rang numerous times with no one coming to the line, there was not an option to leave a message. Will try to contact them back later.

## 2019-02-14 NOTE — Telephone Encounter (Signed)
Called [facility] to follow up on this matter. They are still showing that they are needing a PA. I contacted [employer] and spoke with [place]. She states that the appeal was approved under the pt's medical plan, not pharmacy plan. Pt's medication will have to be [employer].  Nucala Order: 100mg  #1 Vial Order Date: 02/14/2019 Expected date of arrival: 02/15/2019 Ordered by: Desmond Dike, [place]: [facility] with pt. Made her aware of this information. She has been scheduled for her first injection on 02/19/2019 at 2pm. Pt is aware of our office protocol for first injections >> 2 hour wait, Epipen.

## 2019-02-15 NOTE — Telephone Encounter (Signed)
Nucala Shipment Received: 100mg  #1 vial Medication arrival date: 02/15/2019 Lot #: [id number] Exp date: 06/2022 Received by: TBS

## 2019-02-19 ENCOUNTER — Telehealth: Payer: Self-pay | Admitting: *Deleted

## 2019-02-19 ENCOUNTER — Other Ambulatory Visit: Payer: Self-pay

## 2019-02-19 ENCOUNTER — Ambulatory Visit (INDEPENDENT_AMBULATORY_CARE_PROVIDER_SITE_OTHER): Payer: BLUE CROSS/BLUE SHIELD

## 2019-02-19 DIAGNOSIS — J4551 Severe persistent asthma with (acute) exacerbation: Secondary | ICD-10-CM | POA: Diagnosis not present

## 2019-02-19 DIAGNOSIS — R635 Abnormal weight gain: Secondary | ICD-10-CM | POA: Diagnosis not present

## 2019-02-19 DIAGNOSIS — K580 Irritable bowel syndrome with diarrhea: Secondary | ICD-10-CM | POA: Diagnosis not present

## 2019-02-19 MED ORDER — MEPOLIZUMAB 100 MG ~~LOC~~ SOLR
100.0000 mg | Freq: Once | SUBCUTANEOUS | Status: AC
  Administered 2019-02-19: 15:00:00 100 mg via SUBCUTANEOUS

## 2019-02-19 NOTE — Telephone Encounter (Signed)
Spoke with BW regarding TS message with new injection of Nucala today Patient had one tab 25mg  at 2:40pm due to itching  TP was with a patient, TS spoke with Eustaquio Maize W. With the pt's concerns BW suggested for pt to take one 25mg  tab when arriving  Home tonight and zertek at bedtime Pt will need to go to ED if breathing issues occur overnight Pt advised no redness at site area, no itching at 515pm just little on top of her head Pt states she feels better  Spoke with TP about below message. Advised her of BW recommendations.  BW please advise if any further recommendations.

## 2019-02-19 NOTE — Progress Notes (Signed)
Have you been hospitalized within the last 10 days?  No Do you have a fever?  No Do you have a cough?  Yes dry Do you have a headache or sore throat? No   Patient presented to the office today for first-time Nucala injection.  Primary Pulmonologist: Rodman Pickle MD Medication name: Nucala Strength: 100mg  Site(s): Rt.arm  Epi pen/Auvi-Q visible during appointment: Yes  Time of injection: 2:15  Patient evaluated every 15-20 minutes per protocol x2 hours.  1st check: 2:35  Evaluation: Site is red and the size of a pinhead. Pt is itching all over. Pt had  benadryl in her purse, she took it to try and stop the itching. I'm looking in on pt in between checks thru window.. (Pt has had aphylaxis before)  2nd check: 2:55 PM Evaluation: Reaction was gone. Pt is still itching all over.  3rd check: 3:15 PM Evaluation: Itching is starting to calm down. No other reactions or sx.  4th check: 3:35 PM Evaluation: Itching has stopped. No other reactions or sx.  5th check: 3:55PM Evaluation: Starting to itch again (Head and face) It's not too bad right now. At 4:11 pt said her legs are itching some too.  6th check: 4:15PM Evaluation: Pt was still itching (head, face, and legs) not bad. No other sx or reactions. I sent TP and Jessica the ph note. Both of them were with pts.Pt understood. Kelli asked EW and printed out the note for her. Kelli called me and gave me EW's recs.: Take another 25 mg Benadryl when pt gets home, take her Zyrtec tonight. If she starts having any other sx: SOB, wheezing or breaking out into hives go to ED., Pt understood.  EW said it was ok for pt to leave. Nothing further needed.

## 2019-02-19 NOTE — Telephone Encounter (Signed)
Please make a visit with APP Warner Mccreedy  Will need to be seen as still symptomatic  Please contact office for sooner follow up if symptoms do not improve or worsen or seek emergency care

## 2019-02-19 NOTE — Telephone Encounter (Signed)
Patient presented to the office today for first-time Nucala injection.  Primary Pulmonologist: Rodman Pickle MD Medication name: Nucala Strength: 100mg  Site(s): Rt.arm  Epi pen/Auvi-Q visible during appointment: Yes  Time of injection: 2:15  Patient evaluated every 15-20 minutes per protocol x2 hours.  1st check: 2:35  Evaluation: Site is red and the size of a pinhead. Pt is itching all over. Pt had  benadryl in her purse, she took it to try and stop the itching. I'm looking in on pt in between checks thru window.. (Pt has had aphylaxis before)  2nd check: 2:55 PM Evaluation: Reaction was gone. Pt is still itching all over.  3rd check: 3:15 PM Evaluation: Itching is starting to calm down. No other reactions or sx.  4th check: 3:35 PM Evaluation: Itching has stopped. No other reactions or sx.  5th check: 3:55PM Evaluation: Pt is starting to itch again (Head and face) It's not too bad right now. She's supposed to leave at 4:15. Please advise. Routing TP (APP of the day)

## 2019-02-19 NOTE — Telephone Encounter (Signed)
No itching at 4:15 instead of 5:15. Note was created at 4:35. All of this happened while TP was with a pt.  Kelli called me and gave me BW's recs before TP responded. Nothing further needed.

## 2019-02-21 DIAGNOSIS — F331 Major depressive disorder, recurrent, moderate: Secondary | ICD-10-CM | POA: Diagnosis not present

## 2019-02-22 ENCOUNTER — Telehealth: Payer: Self-pay | Admitting: Pulmonary Disease

## 2019-02-22 NOTE — Telephone Encounter (Signed)
Will call on Monday 6/1 as it is 5:30pm.

## 2019-02-25 NOTE — Telephone Encounter (Signed)
Per 01/02/19 note, [facility] has been added to prior authorization process.   Will close this encounter.

## 2019-02-27 ENCOUNTER — Telehealth: Payer: Self-pay | Admitting: Pulmonary Disease

## 2019-02-27 NOTE — Telephone Encounter (Signed)
Routing to injection pool.  

## 2019-02-28 NOTE — Telephone Encounter (Signed)
Pt's medication does not come from [facility]. Her medication comes from [facility] ([place]). [facility] does not need to be added to the prior authorization.

## 2019-03-12 ENCOUNTER — Telehealth: Payer: Self-pay | Admitting: Pulmonary Disease

## 2019-03-12 DIAGNOSIS — F331 Major depressive disorder, recurrent, moderate: Secondary | ICD-10-CM | POA: Diagnosis not present

## 2019-03-12 NOTE — Telephone Encounter (Signed)
Nucala Order: 100mg  #1 Vial Order Date: 03/12/2019 Expected date of arrival: 03/13/2019 Ordered by: Desmond Dike, [place]: Nigel Mormon

## 2019-03-13 NOTE — Telephone Encounter (Signed)
Nucala Shipment Received: 100mg  #1 vial Medication arrival date: 03/13/2019 Lot #: [id number] Exp date: 06/2022 Received by: Desmond Dike, [place]

## 2019-03-19 ENCOUNTER — Ambulatory Visit (INDEPENDENT_AMBULATORY_CARE_PROVIDER_SITE_OTHER): Payer: BC Managed Care – PPO

## 2019-03-19 ENCOUNTER — Telehealth: Payer: Self-pay | Admitting: *Deleted

## 2019-03-19 ENCOUNTER — Other Ambulatory Visit: Payer: Self-pay

## 2019-03-19 DIAGNOSIS — J4551 Severe persistent asthma with (acute) exacerbation: Secondary | ICD-10-CM

## 2019-03-19 MED ORDER — MEPOLIZUMAB 100 MG ~~LOC~~ SOLR
100.0000 mg | Freq: Once | SUBCUTANEOUS | Status: AC
  Administered 2019-03-19: 15:00:00 100 mg via SUBCUTANEOUS

## 2019-03-19 NOTE — Progress Notes (Signed)
Have you been hospitalized within the last 10 days?  No Do you have a fever?  No Do you have a cough?  Yes dry Do you have a headache or sore throat? No

## 2019-03-19 NOTE — Telephone Encounter (Signed)
Dr. Loanne Drilling, Juluis Rainier, pt had a small pinhead size reaction at the site today. It was red, raised and itching. Pt said she was trying not to scratch it. Pt waited for 30 mins. Itching had calmed down and reaction had started to fade. Please refer to ph note 02/19/2019. Pt had the same local reaction.  Please advise if there is something she can do before she gets her inj next time. Please be advised pt said she was going to start taking Zyrtec at night with Benadryl and Singular. Each of them make her sleepy.

## 2019-03-21 NOTE — Telephone Encounter (Signed)
Agree 

## 2019-03-21 NOTE — Telephone Encounter (Signed)
Attempted to call pt but unable to reach. Left pt a detailed message letting her know Dr. Loanne Drilling agreed with her taking Zyrtec at night with benadryl and singulair. Nothing further needed.

## 2019-03-26 DIAGNOSIS — J4550 Severe persistent asthma, uncomplicated: Secondary | ICD-10-CM | POA: Diagnosis not present

## 2019-03-26 DIAGNOSIS — F331 Major depressive disorder, recurrent, moderate: Secondary | ICD-10-CM | POA: Diagnosis not present

## 2019-03-26 DIAGNOSIS — B351 Tinea unguium: Secondary | ICD-10-CM | POA: Diagnosis not present

## 2019-03-26 DIAGNOSIS — I10 Essential (primary) hypertension: Secondary | ICD-10-CM | POA: Diagnosis not present

## 2019-03-26 DIAGNOSIS — Z1331 Encounter for screening for depression: Secondary | ICD-10-CM | POA: Diagnosis not present

## 2019-04-02 DIAGNOSIS — F331 Major depressive disorder, recurrent, moderate: Secondary | ICD-10-CM | POA: Diagnosis not present

## 2019-04-05 ENCOUNTER — Telehealth: Payer: Self-pay | Admitting: Pulmonary Disease

## 2019-04-05 DIAGNOSIS — Z79891 Long term (current) use of opiate analgesic: Secondary | ICD-10-CM | POA: Diagnosis not present

## 2019-04-05 DIAGNOSIS — F900 Attention-deficit hyperactivity disorder, predominantly inattentive type: Secondary | ICD-10-CM | POA: Diagnosis not present

## 2019-04-05 NOTE — Telephone Encounter (Signed)
Nucala Order: 100mg  #1 Vial Order Date: 04/05/2019 Expected date of arrival: 04/08/2019 Ordered by: Desmond Dike, [place]: Nigel Mormon

## 2019-04-08 NOTE — Telephone Encounter (Signed)
Nucala Shipment Received: 100mg  #1 vial Medication arrival date: 04/08/2019 Lot #: [id number] Exp date: 06/2022 Received by: TBS

## 2019-04-16 ENCOUNTER — Other Ambulatory Visit: Payer: Self-pay

## 2019-04-16 ENCOUNTER — Ambulatory Visit (INDEPENDENT_AMBULATORY_CARE_PROVIDER_SITE_OTHER): Payer: BC Managed Care – PPO

## 2019-04-16 DIAGNOSIS — F509 Eating disorder, unspecified: Secondary | ICD-10-CM | POA: Diagnosis not present

## 2019-04-16 DIAGNOSIS — F331 Major depressive disorder, recurrent, moderate: Secondary | ICD-10-CM | POA: Diagnosis not present

## 2019-04-16 DIAGNOSIS — J4551 Severe persistent asthma with (acute) exacerbation: Secondary | ICD-10-CM

## 2019-04-16 MED ORDER — MEPOLIZUMAB 100 MG ~~LOC~~ SOLR
100.0000 mg | SUBCUTANEOUS | Status: DC
  Administered 2019-04-16: 10:00:00 100 mg via SUBCUTANEOUS

## 2019-04-16 NOTE — Progress Notes (Signed)
All questions were answered by the patient before medication was administered. Have you been hospitalized in the last 10 days? No Do you have a fever? No Do you have a cough? No Do you have a headache or sore throat? No   Prior to injection, patient states she took benadryl, singulair and zyrtec last night and repeated Benadryl 25mg  this morning.  Patient states symptoms from last injection did not worsen any further.    Today's Nucala given without reaction.  Patient remained in the office for 20 minutes following the injection.  No redness at site, denies itching or any other symptoms.  Patient instructed to notify us if any concerns develop and continue benadryl and pre-injection medications as advised by Dr. Loanne Drilling.

## 2019-05-06 ENCOUNTER — Telehealth: Payer: Self-pay | Admitting: Pulmonary Disease

## 2019-05-06 NOTE — Telephone Encounter (Signed)
Nucala Order: 100mg #1 Vial Order Date: 05/06/19 Expected date of arrival: 05/07/19 Ordered by: Ayanna Gheen T,LPN [facility]  

## 2019-05-07 DIAGNOSIS — G4700 Insomnia, unspecified: Secondary | ICD-10-CM | POA: Diagnosis not present

## 2019-05-07 DIAGNOSIS — F331 Major depressive disorder, recurrent, moderate: Secondary | ICD-10-CM | POA: Diagnosis not present

## 2019-05-07 NOTE — Telephone Encounter (Signed)
Nucala Shipment Received: 100mg #1 vial Medication arrival date: 05/07/19 Lot #: [id number] Exp date: 05/27/2022 Received by: Izaiyah Kleinman T,LPN 

## 2019-05-13 ENCOUNTER — Telehealth: Payer: Self-pay | Admitting: *Deleted

## 2019-05-13 ENCOUNTER — Ambulatory Visit (INDEPENDENT_AMBULATORY_CARE_PROVIDER_SITE_OTHER): Payer: BC Managed Care – PPO

## 2019-05-13 ENCOUNTER — Other Ambulatory Visit: Payer: Self-pay

## 2019-05-13 DIAGNOSIS — J4551 Severe persistent asthma with (acute) exacerbation: Secondary | ICD-10-CM

## 2019-05-13 MED ORDER — MEPOLIZUMAB 100 MG ~~LOC~~ SOLR
100.0000 mg | SUBCUTANEOUS | Status: DC
  Administered 2019-05-13: 09:00:00 100 mg via SUBCUTANEOUS

## 2019-05-13 NOTE — Progress Notes (Signed)
Have you been hospitalized within the last 10 days?  No Do you have a fever?  No Do you have a cough?  No Do you have a headache or sore throat? No Do you have your Epi Pen visible and is it within date?  Yes 

## 2019-05-13 NOTE — Telephone Encounter (Signed)
Called and spoke with patient. She is scheduled for August 31 at [place] in office with Dr. Loanne Drilling.  Nothing further needed

## 2019-05-13 NOTE — Telephone Encounter (Signed)
-----   Message from [place], MD sent at 05/13/2019  9:57 AM EDT ----- Regarding: Schedule f/u LR- please schedule patient for follow-up with me at some point before end of September. She is receiving biologic agent and has not had a face to face since starting medication.  Rodman Pickle, M.D. [facility] Pulmonary/Critical Care Medicine 05/13/2019 9:59 AM

## 2019-05-13 NOTE — Progress Notes (Signed)
Please ensure patient has follow-up with me. She is currently receiving biologic and is now s/p second injection. Schedule face to face.  Rodman Pickle, M.D. [facility] Pulmonary/Critical Care Medicine 05/13/2019 9:55 AM

## 2019-05-27 ENCOUNTER — Encounter: Payer: Self-pay | Admitting: Pulmonary Disease

## 2019-05-27 ENCOUNTER — Ambulatory Visit (INDEPENDENT_AMBULATORY_CARE_PROVIDER_SITE_OTHER): Payer: BC Managed Care – PPO | Admitting: Pulmonary Disease

## 2019-05-27 ENCOUNTER — Other Ambulatory Visit: Payer: Self-pay

## 2019-05-27 VITALS — BP 126/78 | HR 102 | Temp 97.3°F | Ht 69.0 in | Wt 214.2 lb

## 2019-05-27 DIAGNOSIS — Z7952 Long term (current) use of systemic steroids: Secondary | ICD-10-CM

## 2019-05-27 DIAGNOSIS — Z23 Encounter for immunization: Secondary | ICD-10-CM | POA: Diagnosis not present

## 2019-05-27 DIAGNOSIS — J4551 Severe persistent asthma with (acute) exacerbation: Secondary | ICD-10-CM | POA: Insufficient documentation

## 2019-05-27 DIAGNOSIS — J4550 Severe persistent asthma, uncomplicated: Secondary | ICD-10-CM | POA: Insufficient documentation

## 2019-05-27 MED ORDER — BUDESONIDE-FORMOTEROL FUMARATE 160-4.5 MCG/ACT IN AERO: 2.0000 | INHALATION_SPRAY | Freq: Two times a day (BID) | RESPIRATORY_TRACT | 0 refills | Status: DC

## 2019-05-27 MED ORDER — BUDESONIDE-FORMOTEROL FUMARATE 160-4.5 MCG/ACT IN AERO: 2.0000 | INHALATION_SPRAY | Freq: Two times a day (BID) | RESPIRATORY_TRACT | 6 refills | Status: DC

## 2019-05-27 NOTE — Patient Instructions (Addendum)
Asthma Action Plan  --CONTINUE Nucala --REFILL Symbicort 160-4.5 mcg 2 puffs twice daily --STOP Spiriva Respimat 1.25 mcg 2 puffs daily   IF you develop asthma symptoms (chest tightness, cough and wheezing), take an extra 2 puffs of Symbicort as needed every 4 hours. Call us for persistent symptoms and we can order steroids.  Follow-up in 6 months

## 2019-05-27 NOTE — Progress Notes (Signed)
Synopsis:  Referred in 06/2018 for chronic productive cough and shortness of breath. Methacholine challenge 07/2018 consistent with asthma  Subjective:   PATIENT ID: [name] [name] GENDER: female DOB: 1966-05-10, MRN: [id number]   HPI  Chief Complaint  Patient presents with  . 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   For her asthma, she started [place] in June. She reports improves congestion however started having teary eyes recently due to allergies. She overall feels her asthma is well-controlled and uses her albuterol 1-2 times a week, which is better compared to her daily use. Her asthma manifest initially as a cough will wake her up once a week. Occasionally has chest tightness but improved compared to before. No wheezing. She self-discontinued Spiriva and has not had any respiratory issues. Compliant with her Symbicort. Denies any outpatient exacerbations or ED visits for her asthma.  She has had recent social stressors. Her partner, Yvone Neu, is planning to retire. She is currently in graduate school. Her stepfather's lung cancer was diagnosed as metastatic. Her brother and her are having difficulty getting information from family regarding care and treatment. They are concerned their mom might have dementia. Overall, a difficult situation regarding providing social support and goals of care.  ACT: 109, improved  2019 Jan Feb March April May June July Aug Sept Oct Nov Dec            X X X  2020 Jan Feb March April May June July Aug Sept Oct Nov Dec   X    X           Social History: Non-smoker  Environmental exposures: No known exposures  I have personally reviewed patient's past medical/family/social history/allergies/current medications.  Past Medical History:  Diagnosis Date  . Adrenal  gland hypofunction (HCC)    related to post op, vancomycin & lumbar injection, steroids    . Arthritis    hnp-lumbar   . Blood dyscrasia    hx probable blood clot after ankle surgery  not definitive but treated per pt  . Cancer ([place])    "early evolvng melanoma "- legs   . Depression   . Diabetes insipidus ([place])   . Family history of adverse reaction to anesthesia    mom has N/V - zofran works  . Heart murmur    years ago-echo 05 no problems  . High cholesterol   . History of blood transfusion    "16 so far" (07/25/2012)  . History of bronchitis   . History of UTI   . Hypertension    no med for 1 yr  . 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 Migraines   . Peripheral vascular disease ([place])    ? dvt 2011 tx as preventive although did not show on scan after ankle surgery  . Pneumonia ~ 2009; 2010  . PONV (postoperative nausea and vomiting)    last 2 surgeries less nausea  . PONV (postoperative nausea and vomiting)    none with last surgery-be extra careful with nose patient haas septal hole      Family History  Problem Relation Age of Onset  . Hypertension Mother   . Coronary artery disease Mother   . Hypertension Father      Social History   Occupational History  . Not on file  Tobacco  Use  . Smoking status: Never Smoker  . Smokeless tobacco: Never Used  Substance and Sexual Activity  . Alcohol use: Yes    Alcohol/week: 2.0 standard drinks    Types: 2 Glasses of wine per week    Comment: occasional  . Drug use: No  . Sexual activity: Yes    Allergies  Allergen Reactions  . Amoxicillin Anaphylaxis, Hives and Itching  . Black Walnut Pollen Allergy Skin Test Anaphylaxis  . Penicillins Anaphylaxis    Has patient had a PCN reaction causing immediate rash, facial/tongue/throat swelling, SOB or lightheadedness with hypotension: Yes Has patient had a PCN reaction causing severe rash involving mucus membranes or skin necrosis: No Has patient had a PCN reaction that  required hospitalization Yes Has patient had a PCN reaction occurring within the last 10 years: No If all of the above answers are "NO", then may proceed with Cephalosporin use.   . Sulfa Antibiotics Hives and Other (See Comments)    Hives, mouth blisters   . Chloroquine Other (See Comments)    Renal dysfunction  . Ciprofloxacin Hives  . Phenergan [Promethazine Hcl] Other (See Comments)    Caused "severe drop in blood pressure."  . Sulfamethoxazole Hives  . Other Other (See Comments)    From allergy testing--C[place]el[place]  . Shellfish Allergy Itching and Rash  . Vancomycin Cross Reactors Other (See Comments)    "RED MAN SYNDROME"  Pt stated she had no reaction to Vancomycin when it is given slow     Outpatient Medications Prior to Visit  Medication Sig Dispense Refill  . ADDERALL XR 20 MG 24 hr capsule Take 20 mg by mouth daily as needed (concentration).   0  . desmopressin (DDAVP) 0.1 MG tablet Take 0.1 mg by mouth 2 (two) times daily.    Marland Kitchen EPINEPHrine 0.3 mg/0.3 mL IJ SOAJ injection Inject 0.3 mLs (0.3 mg total) into the muscle as needed for anaphylaxis. 0.3 mL 11  . estradiol (CLIMARA - DOSED IN MG/24 HR) 0.0375 mg/24hr patch Place 0.0375 mg onto the skin once a week. saturdays    . ipratropium (ATROVENT) 0.02 % nebulizer solution Take 2.5 mLs (0.5 mg total) by nebulization every 6 (six) hours as needed for wheezing or shortness of breath. icd- J42 75 mL 12  . montelukast (SINGULAIR) 10 MG tablet Take 1 tablet (10 mg total) by mouth at bedtime. 30 tablet 11  . OVER THE COUNTER MEDICATION Take 6 tablets by mouth daily. Smarty Pants Vitamins    . PROAIR HFA 108 (90 Base) MCG/ACT inhaler     . Probiotic CAPS Take 1 capsule by mouth daily.     Marland Kitchen Respiratory Therapy Supplies (FLUTTER) DEVI Use flutter valve 3 times a day 1 each 0  . rosuvastatin (CRESTOR) 20 MG tablet Take 20 mg by mouth daily.    Marland Kitchen topiramate (TOPAMAX) 50 MG tablet Take 150 mg by mouth daily.    . valACYclovir  (VALTREX) 1000 MG tablet Take two tablets as a single dose prn cold sore. (Patient taking differently: Take 2,000 mg by mouth daily as needed. Take two tablets as a single dose prn cold sore.) 20 tablet 1  . budesonide-formoterol (SYMBICORT) 160-4.5 MCG/ACT inhaler Inhale 2 puffs into the lungs 2 (two) times daily. 1 Inhaler 6  . azithromycin (ZITHROMAX) 250 MG tablet Take 2 tablets (500 mg) on day 1, then take 1 tablet (250 mg) on days 2-5 6 tablet 0  . predniSONE (DELTASONE) 10 MG tablet Take 6 tablets x  three days (60 mg), followed by 5 tablets x three days (50mg ), then 4 tablets x three day(40mg ), then 3 tablets (30mg ) x three days, then 2 tablets (20mg ) x three days, then 1 tablet (10mg ) x three days, then STOP 60 tablet 0  . predniSONE (DELTASONE) 10 MG tablet Take 4 tabs for 2 days, then 3 tabs for 2 days, then 2 tabs for 2 days, then 1 tab for 2 days, then stop 20 tablet 0  . Tiotropium Bromide Monohydrate (SPIRIVA RESPIMAT) 1.25 MCG/ACT AERS Inhale 2 puffs into the lungs daily. (Patient not taking: Reported on 05/27/2019) 1 Inhaler 6   Facility-Administered Medications Prior to Visit  Medication Dose Route Frequency Provider Last Rate Last Dose  . Mepolizumab SOLR 100 mg  100 mg Subcutaneous Q28 days Margaretha Seeds, MD   100 mg at 04/16/19 1022  . Mepolizumab SOLR 100 mg  100 mg Subcutaneous Q28 days Margaretha Seeds, MD   100 mg at 05/13/19 [id number]    Review of Systems  Constitutional: Negative for chills and fever.  HENT: Positive for congestion. Negative for sinus pain and sore throat.   Eyes: Negative for blurred vision.  Respiratory: Positive for cough and shortness of breath. Negative for sputum production and wheezing.   Cardiovascular: Positive for chest pain (chest tightness). Negative for leg swelling.  Gastrointestinal: Negative for abdominal pain and nausea.  Skin: Negative for rash.  Endo/Heme/Allergies: Positive for environmental allergies.     Objective:   Vitals:    05/27/19 0940  BP: 126/78  Pulse: (!) 102  Temp: (!) 97.3 F (36.3 C)  TempSrc: Temporal  SpO2: 96%  Weight: 214 lb 3.2 oz (97.2 kg)  Height: 5\' 9"  (1.753 m)   SpO2: 96 % O2 Device: None (Room air)  Physical Exam: General: Well-appearing, no acute distress HENT: [place], AT Eyes: EOMI, no scleral icterus Respiratory: Clear to auscultation bilaterally.  No crackles, wheezing or rales Cardiovascular: RRR, -M/R/G, no JVD GI: BS+, soft, nontender Extremities:-Edema,-tenderness Neuro: AAO x4, CNII-XII grossly intact Skin: Intact, no rashes or bruising Psych: Normal mood, normal affect  Labs Eosinophil 252 (3.4%) 09/25/18  Chest imaging: CXR 09/25/2018-normal chest x-ray with clear lung fields without evidence of infiltrate, effusion or edema  PFT:  08/07/2018-Mild bronchial hyper reactivity suggestive of asthma  Imaging, labs and test noted above have been reviewed independently by me.    Assessment & Plan:   Severe Persistent Asthma with acute exacerbation  Follow-up for severe persistent asthma with steroid dependency. Currently on monthly Nucala which was initiated on 02/19/19.  --CONTINUE Nucala --REFILL Symbicort 160-4.5 mcg 2 puffs twice daily --STOP Spiriva Respimat 1.25 mcg 2 puffs daily   IF you develop asthma symptoms (chest tightness, cough and wheezing), take an extra 2 puffs of Symbicort as needed every 4 hours. Call us for persistent symptoms and we can order steroids.  Follow-up in 6 months  Immunization History  Administered Date(s) Administered  . Influenza,inj,Quad PF,6+ Mos 07/01/2016, 07/14/2017, 05/27/2019  . Tdap 07/14/2014   Orders Placed This Encounter  Procedures  . Flu Vaccine QUAD 36+ mos IM   Meds ordered this encounter  Medications  . budesonide-formoterol (SYMBICORT) 160-4.5 MCG/ACT inhaler    Sig: Inhale 2 puffs into the lungs 2 (two) times daily.    Dispense:  1 Inhaler    Refill:  6  . budesonide-formoterol (SYMBICORT) 160-4.5  MCG/ACT inhaler    Sig: Inhale 2 puffs into the lungs 2 (two) times daily.    Dispense:  1 Inhaler    Refill:  0    Order Specific Question:   Lot Number?    Answer:   [id number]    Order Specific Question:   Expiration Date?    Answer:   03/26/2020    Order Specific Question:   Manufacturer?    Answer:   [employer] [[id number]]    Order Specific Question:   Quantity    Answer:   1    Return in about 6 months (around 11/24/2019).  Zyann Mabry Rodman Pickle, MD [facility] 05/27/2019 11:01 AM  Personal pager: [phone] If unanswered, please page CCM On-call: [phone]

## 2019-05-28 DIAGNOSIS — F331 Major depressive disorder, recurrent, moderate: Secondary | ICD-10-CM | POA: Diagnosis not present

## 2019-06-10 ENCOUNTER — Ambulatory Visit (INDEPENDENT_AMBULATORY_CARE_PROVIDER_SITE_OTHER): Payer: BC Managed Care – PPO

## 2019-06-10 ENCOUNTER — Other Ambulatory Visit: Payer: Self-pay

## 2019-06-10 DIAGNOSIS — J4550 Severe persistent asthma, uncomplicated: Secondary | ICD-10-CM | POA: Diagnosis not present

## 2019-06-10 DIAGNOSIS — Z7952 Long term (current) use of systemic steroids: Secondary | ICD-10-CM | POA: Diagnosis not present

## 2019-06-10 MED ORDER — MEPOLIZUMAB 100 MG ~~LOC~~ SOLR
100.0000 mg | SUBCUTANEOUS | Status: DC
  Administered 2019-06-10: 100 mg via SUBCUTANEOUS

## 2019-06-10 NOTE — Progress Notes (Signed)
All questions were answered by the patient before medication was administered. Have you been hospitalized in the last 10 days? No Do you have a fever? No Do you have a cough? No Do you have a headache or sore throat? No  

## 2019-07-01 ENCOUNTER — Telehealth: Payer: Self-pay | Admitting: Pulmonary Disease

## 2019-07-01 NOTE — Telephone Encounter (Signed)
Nucala Order: 100mg #1 Vial Order Date: 07/01/19 Expected date of arrival: 07/02/19 Ordered by: Macaulay Reicher,LPN [facility] 

## 2019-07-02 NOTE — Telephone Encounter (Signed)
Nucala Shipment Received: 100mg #1 vial Medication arrival date: 07/02/19 Lot #: [id number] Exp date: 07/27/2022 Received by: Lalena Salas,LPN 

## 2019-07-03 DIAGNOSIS — F900 Attention-deficit hyperactivity disorder, predominantly inattentive type: Secondary | ICD-10-CM | POA: Diagnosis not present

## 2019-07-08 ENCOUNTER — Other Ambulatory Visit: Payer: Self-pay

## 2019-07-08 ENCOUNTER — Encounter: Payer: Self-pay | Admitting: Pulmonary Disease

## 2019-07-08 ENCOUNTER — Ambulatory Visit (INDEPENDENT_AMBULATORY_CARE_PROVIDER_SITE_OTHER): Payer: BC Managed Care – PPO | Admitting: Pulmonary Disease

## 2019-07-08 VITALS — BP 112/76 | HR 102 | Temp 97.6°F | Ht 69.0 in | Wt 210.2 lb

## 2019-07-08 DIAGNOSIS — J4550 Severe persistent asthma, uncomplicated: Secondary | ICD-10-CM

## 2019-07-08 DIAGNOSIS — J069 Acute upper respiratory infection, unspecified: Secondary | ICD-10-CM | POA: Diagnosis not present

## 2019-07-08 MED ORDER — PREDNISONE 20 MG PO TABS: 40.0000 mg | ORAL_TABLET | Freq: Every day | ORAL | 0 refills | Status: DC

## 2019-07-08 MED ORDER — MEPOLIZUMAB 100 MG ~~LOC~~ SOLR
100.0000 mg | Freq: Once | SUBCUTANEOUS | Status: AC
  Administered 2019-07-08: 100 mg via SUBCUTANEOUS

## 2019-07-08 NOTE — Patient Instructions (Addendum)
Suspected upper viral infection/otitis media --START prednisone 40 mg for five days  Severe persistent asthma --OK to receive Nucala injection today --CONTINUE Symbicort as directed --CONTINUE Albuterol as needed for shortness of breath or wheezing  Asthma Action Plan IF you develop asthma symptoms (chest tightness, cough and wheezing), take an extra 2 puffs of Symbicort as needed every 4 hours. Call us for persistent symptoms and we can order steroids.   Please call us if you develop FEVER, COUGH, OR SHORTNESS OF BREATH. Would be reasonable to consider Covid testing.

## 2019-07-08 NOTE — Progress Notes (Signed)
Synopsis:  Referred in 06/2018 for chronic productive cough and shortness of breath. Methacholine challenge 07/2018 consistent with asthma  Subjective:   PATIENT ID: [name] [name] GENDER: female DOB: January 10, 1966, MRN: [id number]   HPI  Chief Complaint  Patient presents with  . Follow-up    nasal drainage, left ear pain, dizzy, increased cough for a little over a week   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   She has been on Nucala since June 2020.  She is on Symbicort. She had to restart her benadryl and start taking her albuterol 2-3 times this week. For the last two days, she has had left ear fullness, mild headache, fatigue and nasal congestion. She also has had some dizziness. Also reports minimal non-productive cough but nothing more than would be triggered by her usual allergies. Denies shortness of breath or wheezing. Denies fevers, chills, no changes in taste or smell. She participates in graduate school on night a week and denies sick contacts. She recently went to [place] one week ago and felt they followed social distancing precautions.   ACT: 19  2019 Jan Feb March April May June July Aug Sept Oct Nov Dec            X X X  2020 Jan Feb March April May June July Aug Sept Oct Nov Dec   X    X           Social History: Non-smoker She has had recent social stressors. Her partner, Yvone Neu, is planning to retire. She is currently in graduate school. Her stepfather's lung cancer was diagnosed as metastatic. Her brother and her are having difficulty getting information from family regarding care and treatment. They are concerned their mom might have dementia. Overall, a difficult situation regarding providing social support and goals of care.  Environmental exposures: No known exposures  I have personally reviewed  patient's past medical/family/social history/allergies/current medications.  Past Medical History:  Diagnosis Date  . 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 Arthritis    hnp-lumbar   . Blood dyscrasia    hx probable blood clot after ankle surgery  not definitive but treated per pt  . Cancer ([place])    "early evolvng melanoma "- legs   . Depression   . Diabetes insipidus ([place])   . Family history of adverse reaction to anesthesia    mom has N/V - zofran works  . Heart murmur    years ago-echo 05 no problems  . High cholesterol   . History of blood transfusion    "16 so far" (07/25/2012)  . History of bronchitis   . History of UTI   . Hypertension    no med for 1 yr  . 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 Migraines   . Peripheral vascular disease ([place])    ? dvt 2011 tx as preventive although did not show on scan after ankle surgery  . Pneumonia ~ 2009; 2010  . PONV (postoperative nausea and vomiting)    last 2 surgeries less nausea  . PONV (postoperative nausea and vomiting)    none with last surgery-be extra careful with nose patient haas septal hole      Family History  Problem Relation Age of Onset  . Hypertension Mother   .  Coronary artery disease Mother   . Hypertension Father   . Lung cancer Father      Social History   Occupational History  . Not on file  Tobacco Use  . Smoking status: Never Smoker  . Smokeless tobacco: Never Used  Substance and Sexual Activity  . Alcohol use: Yes    Alcohol/week: 2.0 standard drinks    Types: 2 Glasses of wine per week    Comment: occasional  . Drug use: No  . Sexual activity: Yes    Allergies  Allergen Reactions  . Amoxicillin Anaphylaxis, Hives and Itching  . Black Walnut Pollen Allergy Skin Test Anaphylaxis  . Penicillins Anaphylaxis    Has patient had a PCN reaction causing immediate rash, facial/tongue/throat swelling, SOB or lightheadedness with  hypotension: Yes Has patient had a PCN reaction causing severe rash involving mucus membranes or skin necrosis: No Has patient had a PCN reaction that required hospitalization Yes Has patient had a PCN reaction occurring within the last 10 years: No If all of the above answers are "NO", then may proceed with Cephalosporin use.   . Sulfa Antibiotics Hives and Other (See Comments)    Hives, mouth blisters   . Chloroquine Other (See Comments)    Renal dysfunction  . Ciprofloxacin Hives  . Phenergan [Promethazine Hcl] Other (See Comments)    Caused "severe drop in blood pressure."  . Sulfamethoxazole Hives  . Other Other (See Comments)    From allergy testing--Canteloupe  . Shellfish Allergy Itching and Rash  . Vancomycin Cross Reactors Other (See Comments)    "RED MAN SYNDROME"  Pt stated she had no reaction to Vancomycin when it is given slow     Outpatient Medications Prior to Visit  Medication Sig Dispense Refill  . ADDERALL XR 20 MG 24 hr capsule Take 20 mg by mouth daily as needed (concentration).   0  . budesonide-formoterol (SYMBICORT) 160-4.5 MCG/ACT inhaler Inhale 2 puffs into the lungs 2 (two) times daily. 1 Inhaler 6  . desmopressin (DDAVP) 0.1 MG tablet Take 0.1 mg by mouth 2 (two) times daily.    Marland Kitchen EPINEPHrine 0.3 mg/0.3 mL IJ SOAJ injection Inject 0.3 mLs (0.3 mg total) into the muscle as needed for anaphylaxis. 0.3 mL 11  . estradiol (CLIMARA - DOSED IN MG/24 HR) 0.0375 mg/24hr patch Place 0.0375 mg onto the skin once a week. saturdays    . ipratropium (ATROVENT) 0.02 % nebulizer solution Take 2.5 mLs (0.5 mg total) by nebulization every 6 (six) hours as needed for wheezing or shortness of breath. icd- J42 75 mL 12  . montelukast (SINGULAIR) 10 MG tablet Take 1 tablet (10 mg total) by mouth at bedtime. 30 tablet 11  . OVER THE COUNTER MEDICATION Take 6 tablets by mouth daily. Smarty Pants Vitamins    . PROAIR HFA 108 (90 Base) MCG/ACT inhaler     . Probiotic CAPS Take 1  capsule by mouth daily.     Marland Kitchen Respiratory Therapy Supplies (FLUTTER) DEVI Use flutter valve 3 times a day 1 each 0  . rosuvastatin (CRESTOR) 20 MG tablet Take 20 mg by mouth daily.    Marland Kitchen topiramate (TOPAMAX) 50 MG tablet Take 150 mg by mouth daily.    . valACYclovir (VALTREX) 1000 MG tablet Take two tablets as a single dose prn cold sore. (Patient taking differently: Take 2,000 mg by mouth daily as needed. Take two tablets as a single dose prn cold sore.) 20 tablet 1  . budesonide-formoterol (SYMBICORT) 160-4.5  MCG/ACT inhaler Inhale 2 puffs into the lungs 2 (two) times daily. 1 Inhaler 0   Facility-Administered Medications Prior to Visit  Medication Dose Route Frequency Provider Last Rate Last Dose  . Mepolizumab SOLR 100 mg  100 mg Subcutaneous Q28 days Margaretha Seeds, MD   100 mg at 04/16/19 1022  . Mepolizumab SOLR 100 mg  100 mg Subcutaneous Q28 days Margaretha Seeds, MD   100 mg at 05/13/19 [id number]  . Mepolizumab SOLR 100 mg  100 mg Subcutaneous Q28 days Margaretha Seeds, MD   100 mg at 06/10/19 [id number]    Review of Systems  Constitutional: Positive for malaise/fatigue. Negative for chills, diaphoresis and fever.  HENT: Positive for congestion, ear pain and sinus pain. Negative for ear discharge.   Respiratory: Positive for cough. Negative for hemoptysis, sputum production, shortness of breath and wheezing.   Cardiovascular: Negative for chest pain, palpitations, leg swelling and PND.  Gastrointestinal: Negative for abdominal pain and nausea.  Musculoskeletal: Negative for myalgias.  Skin: Negative for rash.  Neurological: Positive for dizziness and headaches. Negative for tingling, tremors, sensory change, focal weakness, loss of consciousness and weakness.  Endo/Heme/Allergies: Positive for environmental allergies.     Objective:   Vitals:   07/08/19 0859 07/08/19 0900  BP:  112/76  Pulse:  (!) 102  Temp: 97.6 F (36.4 C)   TempSrc: Temporal   SpO2:  99%  Weight: 210 lb 3.2  oz (95.3 kg)   Height: 5\' 9"  (1.753 m)    SpO2: 99 % O2 Device: None (Room air)  Physical Exam: General: Well-appearing, no acute distress HENT: [place], AT Eyes: EOMI, no scleral icterus Respiratory: Diminished breath sounds bilaterally.  No crackles, wheezing or rales Cardiovascular: RRR, -M/R/G, no JVD GI: BS+, soft, nontender Extremities:-Edema,-tenderness Neuro: AAO x4, CNII-XII grossly intact Skin: Intact, no rashes or bruising Psych: Normal mood, normal affect  Labs Eosinophil 252 (3.4%) 09/25/18  Chest imaging: CXR 09/25/2018-normal chest x-ray with clear lung fields without evidence of infiltrate, effusion or edema  PFT:  08/07/2018-Mild bronchial hyper reactivity suggestive of asthma  Imaging, labs and test noted above have been reviewed independently by me.    Assessment & Plan:   53 year old female with severe persistent asthma for follow-up. Currently on monthly Nucala which was initiated on 02/19/19.  Suspected upper viral infection/otitis media --START prednisone 40 mg for five days  Severe Persistent Eosinophilic Asthma, steroid dependent --Administer Nucala injection today --CONTINUE Symbicort as directed --CONTINUE Albuterol as needed for shortness of breath or wheezing  Asthma Action Plan IF you develop asthma symptoms (chest tightness, cough and wheezing), take an extra 2 puffs of Symbicort as needed every 4 hours. Call us for persistent symptoms and we can order steroids.  Please call us if you develop FEVER, COUGH, OR SHORTNESS OF BREATH. Would be reasonable to consider Covid testing.  IF you develop asthma symptoms (chest tightness, cough and wheezing), take an extra 2 puffs of Symbicort as needed every 4 hours. Call us for persistent symptoms and we can order steroids.  Immunization History  Administered Date(s) Administered  . Influenza,inj,Quad PF,6+ Mos 07/01/2016, 07/14/2017, 05/27/2019  . Tdap 07/14/2014   No orders of the defined types were  placed in this encounter.  Meds ordered this encounter  Medications  . predniSONE (DELTASONE) 20 MG tablet    Sig: Take 2 tablets (40 mg total) by mouth daily with breakfast.    Dispense:  10 tablet    Refill:  0  Return in about 3 months (around 10/08/2019).  Clavin Ruhlman Rodman Pickle, MD [facility] 07/08/2019 9:12 AM  Personal pager: [phone] If unanswered, please page CCM On-call: [phone]

## 2019-07-08 NOTE — Progress Notes (Signed)
Have you been hospitalized within the last 10 days?  No Do you have a fever?  No Do you have a cough?  No Do you have a headache or sore throat? No Do you have your Epi Pen visible and is it within date?  Yes 

## 2019-07-10 ENCOUNTER — Ambulatory Visit: Payer: BC Managed Care – PPO

## 2019-07-15 DIAGNOSIS — F331 Major depressive disorder, recurrent, moderate: Secondary | ICD-10-CM | POA: Diagnosis not present

## 2019-07-29 ENCOUNTER — Other Ambulatory Visit: Payer: Self-pay | Admitting: Primary Care

## 2019-07-29 ENCOUNTER — Telehealth: Payer: Self-pay | Admitting: Pulmonary Disease

## 2019-07-29 DIAGNOSIS — J42 Unspecified chronic bronchitis: Secondary | ICD-10-CM

## 2019-07-29 DIAGNOSIS — R0609 Other forms of dyspnea: Secondary | ICD-10-CM

## 2019-07-29 DIAGNOSIS — R0600 Dyspnea, unspecified: Secondary | ICD-10-CM

## 2019-07-29 NOTE — Telephone Encounter (Signed)
Nucala Order: 100mg  #1 Vial Order Date: 07/29/2019 Expected date of arrival: 07/30/2019 Ordered by: Desmond Dike, [place]  [facility]

## 2019-07-30 NOTE — Telephone Encounter (Signed)
Nucala Shipment Received: 100mg  #1 vial Medication arrival date: 07/30/2019 Lot #: [id number] Exp date: 07/2022 Received by: Desmond Dike, [place]

## 2019-08-05 ENCOUNTER — Other Ambulatory Visit: Payer: Self-pay

## 2019-08-05 ENCOUNTER — Ambulatory Visit (INDEPENDENT_AMBULATORY_CARE_PROVIDER_SITE_OTHER): Payer: BC Managed Care – PPO

## 2019-08-05 DIAGNOSIS — F331 Major depressive disorder, recurrent, moderate: Secondary | ICD-10-CM | POA: Diagnosis not present

## 2019-08-05 DIAGNOSIS — J4550 Severe persistent asthma, uncomplicated: Secondary | ICD-10-CM | POA: Diagnosis not present

## 2019-08-05 MED ORDER — MEPOLIZUMAB 100 MG ~~LOC~~ SOLR
100.0000 mg | SUBCUTANEOUS | Status: DC
  Administered 2019-08-05: 100 mg via SUBCUTANEOUS

## 2019-08-05 NOTE — Progress Notes (Signed)
All questions were answered by the patient before medication was administered. Have you been hospitalized in the last 10 days? No Do you have a fever? No Do you have a cough? No Do you have a headache or sore throat? No  

## 2019-08-23 DIAGNOSIS — F331 Major depressive disorder, recurrent, moderate: Secondary | ICD-10-CM | POA: Diagnosis not present

## 2019-08-26 ENCOUNTER — Telehealth: Payer: Self-pay

## 2019-08-26 NOTE — Telephone Encounter (Signed)
Nucala Order: 100mg  #1 Vial Order Date: 08/26/19  Expected date of arrival: 08/27/19  Ordered by: Len Blalock, [place]  [facility]

## 2019-08-27 NOTE — Telephone Encounter (Signed)
Nucala Shipment Received: 100mg  #1 vial Medication arrival date: 08/27/19  Lot #: [id number] Exp date: 10/27/2022 Received by: Len Blalock, CMA

## 2019-09-02 ENCOUNTER — Ambulatory Visit (INDEPENDENT_AMBULATORY_CARE_PROVIDER_SITE_OTHER): Payer: BC Managed Care – PPO

## 2019-09-02 ENCOUNTER — Other Ambulatory Visit: Payer: Self-pay

## 2019-09-02 DIAGNOSIS — J4550 Severe persistent asthma, uncomplicated: Secondary | ICD-10-CM

## 2019-09-02 MED ORDER — MEPOLIZUMAB 100 MG ~~LOC~~ SOLR
100.0000 mg | SUBCUTANEOUS | Status: DC
  Administered 2019-09-02: 100 mg via SUBCUTANEOUS

## 2019-09-02 NOTE — Progress Notes (Signed)
Have you been hospitalized within the last 10 days?  No Do you have a fever?  No Do you have a cough?  No Do you have a headache or sore throat? No Do you have your Epi Pen visible and is it within date?  Yes 

## 2019-09-04 DIAGNOSIS — F331 Major depressive disorder, recurrent, moderate: Secondary | ICD-10-CM | POA: Diagnosis not present

## 2019-09-11 ENCOUNTER — Ambulatory Visit (INDEPENDENT_AMBULATORY_CARE_PROVIDER_SITE_OTHER): Payer: BC Managed Care – PPO | Admitting: Primary Care

## 2019-09-11 ENCOUNTER — Telehealth: Payer: Self-pay | Admitting: Pulmonary Disease

## 2019-09-11 ENCOUNTER — Other Ambulatory Visit: Payer: Self-pay

## 2019-09-11 ENCOUNTER — Encounter: Payer: Self-pay | Admitting: Primary Care

## 2019-09-11 DIAGNOSIS — J45901 Unspecified asthma with (acute) exacerbation: Secondary | ICD-10-CM | POA: Diagnosis not present

## 2019-09-11 DIAGNOSIS — F331 Major depressive disorder, recurrent, moderate: Secondary | ICD-10-CM | POA: Diagnosis not present

## 2019-09-11 DIAGNOSIS — G4700 Insomnia, unspecified: Secondary | ICD-10-CM | POA: Diagnosis not present

## 2019-09-11 DIAGNOSIS — J209 Acute bronchitis, unspecified: Secondary | ICD-10-CM | POA: Diagnosis not present

## 2019-09-11 DIAGNOSIS — I10 Essential (primary) hypertension: Secondary | ICD-10-CM | POA: Diagnosis not present

## 2019-09-11 DIAGNOSIS — J4551 Severe persistent asthma with (acute) exacerbation: Secondary | ICD-10-CM

## 2019-09-11 DIAGNOSIS — J4550 Severe persistent asthma, uncomplicated: Secondary | ICD-10-CM | POA: Diagnosis not present

## 2019-09-11 MED ORDER — PREDNISONE 10 MG PO TABS: ORAL_TABLET | ORAL | 0 refills | Status: DC

## 2019-09-11 MED ORDER — AZITHROMYCIN 250 MG PO TABS: ORAL_TABLET | ORAL | 0 refills | Status: DC

## 2019-09-11 NOTE — Telephone Encounter (Signed)
Opened in error

## 2019-09-11 NOTE — Patient Instructions (Signed)
Asthma exacerbation with bronchitis: - RX zpack and prednisone taper (40mg  x 3 days; 30mg  x 3 days; 20mg  x 3 days; 10mg  x 3 days) - Continue Symbicort 160 two puffs twice daily - Continue Nucala 100mg  q 4 weeks - Continue Albuterol hfa/Atrovent neb every 6 hours for shortness of breath/wheezing - Obtain pulse oximeter, if O2 <90% needs ED evaluation  - Needs covid testing   Follow Up Instructions:  - FU in 2 days televisit with NP - ED is symptoms worsen or O2 <90% RA   Asthma Attack Prevention, Adult Although you may not be able to control the fact that you have asthma, you can take actions to prevent episodes of asthma (asthma attacks). These actions include:  Creating a written plan for managing and treating your asthma attacks (asthma action plan).  Monitoring your asthma.  Avoiding things that can irritate your airways or make your asthma symptoms worse (asthma triggers).  Taking your medicines as directed.  Acting quickly if you have signs or symptoms of an asthma attack. What are some ways to prevent an asthma attack? Create a plan Work with your health care provider to create an asthma action plan. This plan should include:  A list of your asthma triggers and how to avoid them.  A list of symptoms that you experience during an asthma attack.  Information about when to take medicine and how much medicine to take.  Information to help you understand your peak flow measurements.  Contact information for your health care providers.  Daily actions that you can take to control asthma. Monitor your asthma To monitor your asthma:  Use your peak flow meter every morning and every evening for 2-3 weeks. Record the results in a journal. A drop in your peak flow numbers on one or more days may mean that you are starting to have an asthma attack, even if you are not having symptoms.  When you have asthma symptoms, write them down in a journal.  Avoid asthma triggers Work  with your health care provider to find out what your asthma triggers are. This can be done by:  Being tested for allergies.  Keeping a journal that notes when asthma attacks occur and what may have contributed to them.  Asking your health care provider whether other medical conditions make your asthma worse. Common asthma triggers include:  Dust.  Smoke. This includes campfire smoke and secondhand smoke from tobacco products.  Pet dander.  Trees, grasses or pollens.  Very cold, dry, or humid air.  Mold.  Foods that contain high amounts of sulfites.  Strong smells.  Engine exhaust and air pollution.  Aerosol sprays and fumes from household cleaners.  Household pests and their droppings, including dust mites and cockroaches.  Certain medicines, including NSAIDs. Once you have determined your asthma triggers, take steps to avoid them. Depending on your triggers, you may be able to reduce the chance of an asthma attack by:  Keeping your home clean. Have someone dust and vacuum your home for you 1 or 2 times a week. If possible, have them use a high-efficiency particulate arrestance (HEPA) vacuum.  Washing your sheets weekly in hot water.  Using allergy-proof mattress covers and casings on your bed.  Keeping pets out of your home.  Taking care of mold and water problems in your home.  Avoiding areas where people smoke.  Avoiding using strong perfumes or odor sprays.  Avoid spending a lot of time outdoors when pollen counts are high  and on very windy days.  Talking with your health care provider before stopping or starting any new medicines. Medicines Take over-the-counter and prescription medicines only as told by your health care provider. Many asthma attacks can be prevented by carefully following your medicine schedule. Taking your medicines correctly is especially important when you cannot avoid certain asthma triggers. Even if you are doing well, do not stop taking  your medicine and do not take less medicine. Act quickly If an asthma attack happens, acting quickly can decrease how severe it is and how long it lasts. Take these actions:  Pay attention to your symptoms. If you are coughing, wheezing, or having difficulty breathing, do not wait to see if your symptoms go away on their own. Follow your asthma action plan.  If you have followed your asthma action plan and your symptoms are not improving, call your health care provider or seek immediate medical care at the nearest hospital. It is important to write down how often you need to use your fast-acting rescue inhaler. You can track how often you use an inhaler in your journal. If you are using your rescue inhaler more often, it may mean that your asthma is not under control. Adjusting your asthma treatment plan may help you to prevent future asthma attacks and help you to gain better control of your condition. How can I prevent an asthma attack when I exercise? Exercise is a common asthma trigger. To prevent asthma attacks during exercise:  Follow advice from your health care provider about whether you should use your fast-acting inhaler before exercising. Many people with asthma experience exercise-induced bronchoconstriction (EIB). This condition often worsens during vigorous exercise in cold, humid, or dry environments. Usually, people with EIB can stay very active by using a fast-acting inhaler before exercising.  Avoid exercising outdoors in very cold or humid weather.  Avoid exercising outdoors when pollen counts are high.  Warm up and cool down when exercising.  Stop exercising right away if asthma symptoms start. Consider taking part in exercises that are less likely to cause asthma symptoms such as:  Indoor swimming.  Biking.  Walking.  Hiking.  Playing football. This information is not intended to replace advice given to you by your health care provider. Make sure you discuss any  questions you have with your health care provider. Document Released: 08/31/2009 Document Revised: 08/25/2017 Document Reviewed: 02/27/2016 Elsevier Patient Education  2020 [place].    COVID-19 COVID-19 is a respiratory infection that is caused by a virus called severe acute respiratory syndrome coronavirus 2 (SARS-CoV-2). The disease is also known as coronavirus disease or novel coronavirus. In some people, the virus may not cause any symptoms. In others, it may cause a serious infection. The infection can get worse quickly and can lead to complications, such as:  Pneumonia, or infection of the lungs.  Acute respiratory distress syndrome or ARDS. This is fluid build-up in the lungs.  Acute respiratory failure. This is a condition in which there is not enough oxygen passing from the lungs to the body.  Sepsis or septic shock. This is a serious bodily reaction to an infection.  Blood clotting problems.  Secondary infections due to bacteria or fungus. The virus that causes COVID-19 is contagious. This means that it can spread from person to person through droplets from coughs and sneezes (respiratory secretions). What are the causes? This illness is caused by a virus. You may catch the virus by:  Breathing in droplets from an  infected person's cough or sneeze.  Touching something, like a table or a doorknob, that was exposed to the virus (contaminated) and then touching your mouth, nose, or eyes. What increases the risk? Risk for infection You are more likely to be infected with this virus if you:  Live in or travel to an area with a COVID-19 outbreak.  Come in contact with a sick person who recently traveled to an area with a COVID-19 outbreak.  Provide care for or live with a person who is infected with COVID-19. Risk for serious illness You are more likely to become seriously ill from the virus if you:  Are 56 years of age or older.  Have a long-term disease that  lowers your body's ability to fight infection (immunocompromised).  Live in a nursing home or long-term care facility.  Have a long-term (chronic) disease such as: ? Chronic lung disease, including chronic obstructive pulmonary disease or asthma ? Heart disease. ? Diabetes. ? Chronic kidney disease. ? Liver disease.  Are obese. What are the signs or symptoms? Symptoms of this condition can range from mild to severe. Symptoms may appear any time from 2 to 14 days after being exposed to the virus. They include:  A fever.  A cough.  Difficulty breathing.  Chills.  Muscle pains.  A sore throat.  Loss of taste or smell. Some people may also have stomach problems, such as nausea, vomiting, or diarrhea. Other people may not have any symptoms of COVID-19. How is this diagnosed? This condition may be diagnosed based on:  Your signs and symptoms, especially if: ? You live in an area with a COVID-19 outbreak. ? You recently traveled to or from an area where the virus is common. ? You provide care for or live with a person who was diagnosed with COVID-19.  A physical exam.  Lab tests, which may include: ? A nasal swab to take a sample of fluid from your nose. ? A throat swab to take a sample of fluid from your throat. ? A sample of mucus from your lungs (sputum). ? Blood tests.  Imaging tests, which may include, X-rays, CT scan, or ultrasound. How is this treated? At present, there is no medicine to treat COVID-19. Medicines that treat other diseases are being used on a trial basis to see if they are effective against COVID-19. Your health care provider will talk with you about ways to treat your symptoms. For most people, the infection is mild and can be managed at home with rest, fluids, and over-the-counter medicines. Treatment for a serious infection usually takes places in a hospital intensive care unit (ICU). It may include one or more of the following treatments. These  treatments are given until your symptoms improve.  Receiving fluids and medicines through an IV.  Supplemental oxygen. Extra oxygen is given through a tube in the nose, a face mask, or a hood.  Positioning you to lie on your stomach (prone position). This makes it easier for oxygen to get into the lungs.  Continuous positive airway pressure (CPAP) or bi-level positive airway pressure (BPAP) machine. This treatment uses mild air pressure to keep the airways open. A tube that is connected to a motor delivers oxygen to the body.  Ventilator. This treatment moves air into and out of the lungs by using a tube that is placed in your windpipe.  Tracheostomy. This is a procedure to create a hole in the neck so that a breathing tube can be inserted.  Extracorporeal membrane oxygenation (ECMO). This procedure gives the lungs a chance to recover by taking over the functions of the heart and lungs. It supplies oxygen to the body and removes carbon dioxide. Follow these instructions at home: Lifestyle  If you are sick, stay home except to get medical care. Your health care provider will tell you how long to stay home. Call your health care provider before you go for medical care.  Rest at home as told by your health care provider.  Do not use any products that contain nicotine or tobacco, such as cigarettes, e-cigarettes, and chewing tobacco. If you need help quitting, ask your health care provider.  Return to your normal activities as told by your health care provider. Ask your health care provider what activities are safe for you. General instructions  Take over-the-counter and prescription medicines only as told by your health care provider.  Drink enough fluid to keep your urine pale yellow.  Keep all follow-up visits as told by your health care provider. This is important. How is this prevented?  There is no vaccine to help prevent COVID-19 infection. However, there are steps you can take  to protect yourself and others from this virus. To protect yourself:   Do not travel to areas where COVID-19 is a risk. The areas where COVID-19 is reported change often. To identify high-risk areas and travel restrictions, check the CDC travel website: FatFares.com.br  If you live in, or must travel to, an area where COVID-19 is a risk, take precautions to avoid infection. ? Stay away from people who are sick. ? Wash your hands often with soap and water for 20 seconds. If soap and water are not available, use an alcohol-based hand sanitizer. ? Avoid touching your mouth, face, eyes, or nose. ? Avoid going out in public, follow guidance from your state and local health authorities. ? If you must go out in public, wear a cloth face covering or face mask. ? Disinfect objects and surfaces that are frequently touched every day. This may include:  Counters and tables.  Doorknobs and light switches.  Sinks and faucets.  Electronics, such as phones, remote controls, keyboards, computers, and tablets. To protect others: If you have symptoms of COVID-19, take steps to prevent the virus from spreading to others.  If you think you have a COVID-19 infection, contact your health care provider right away. Tell your health care team that you think you may have a COVID-19 infection.  Stay home. Leave your house only to seek medical care. Do not use public transport.  Do not travel while you are sick.  Wash your hands often with soap and water for 20 seconds. If soap and water are not available, use alcohol-based hand sanitizer.  Stay away from other members of your household. Let healthy household members care for children and pets, if possible. If you have to care for children or pets, wash your hands often and wear a mask. If possible, stay in your own room, separate from others. Use a different bathroom.  Make sure that all people in your household wash their hands well and  often.  Cough or sneeze into a tissue or your sleeve or elbow. Do not cough or sneeze into your hand or into the air.  Wear a cloth face covering or face mask. Where to find more information  Centers for Disease Control and Prevention: PurpleGadgets.be  World Health Organization: https://www.castaneda.info/ Contact a health care provider if:  You live in or  have traveled to an area where COVID-19 is a risk and you have symptoms of the infection.  You have had contact with someone who has COVID-19 and you have symptoms of the infection. Get help right away if:  You have trouble breathing.  You have pain or pressure in your chest.  You have confusion.  You have bluish lips and fingernails.  You have difficulty waking from sleep.  You have symptoms that get worse. These symptoms may represent a serious problem that is an emergency. Do not wait to see if the symptoms will go away. Get medical help right away. Call your local emergency services (911 in the [place] Do not drive yourself to the hospital. Let the emergency medical personnel know if you think you have COVID-19. Summary  COVID-19 is a respiratory infection that is caused by a virus. It is also known as coronavirus disease or novel coronavirus. It can cause serious infections, such as pneumonia, acute respiratory distress syndrome, acute respiratory failure, or sepsis.  The virus that causes COVID-19 is contagious. This means that it can spread from person to person through droplets from coughs and sneezes.  You are more likely to develop a serious illness if you are 41 years of age or older, have a weak immunity, live in a nursing home, or have chronic disease.  There is no medicine to treat COVID-19. Your health care provider will talk with you about ways to treat your symptoms.  Take steps to protect yourself and others from infection. Wash your hands often and disinfect objects and  surfaces that are frequently touched every day. Stay away from people who are sick and wear a mask if you are sick. This information is not intended to replace advice given to you by your health care provider. Make sure you discuss any questions you have with your health care provider. Document Released: 10/18/2018 Document Revised: 02/07/2019 Document Reviewed: 10/18/2018 Elsevier Patient Education  2020 [employer].

## 2019-09-11 NOTE — Progress Notes (Signed)
Virtual Visit via Telephone Note  I connected with [name] [name] on 09/11/19 at  3:30 PM EST by telephone and verified that I am speaking with the correct person using two identifiers.  Location: Patient: Home Provider: Office   I discussed the limitations, risks, security and privacy concerns of performing an evaluation and management service by telephone and the availability of in person appointments. I also discussed with the patient that there may be a patient responsible charge related to this service. The patient expressed understanding and agreed to proceed.  Synopsis:  Referred in 06/2018 for chronic productive cough and shortness of breath. Methacholine challenge 07/2018 consistent with asthma She has been on Nucala since June 2020  History of Present Illness: 53 year old female, never smoked. PMH significant for asthma, bronchitis, sinusitis, adrenal insufficiency. Patient of Dr. Loanne Drilling, last seen on 07/08/19. Maintained on Symbicort 160 and Nucala.   09/11/2019 Patient contacted today for acute televisit, reports shortness of breath and cough x1 week. Symptoms worsening over the last few days. Cough is mostly dry during the day and then becomes productive at night. When she is able to get mucus up it is a golden/yellow color. She has tried benadryl, dayquil, nyquil, tessalon perles and mucinex with no improvement. States that her symptoms feel like a typical asthma exacerbation but she has been well controlled over the last year. Having a hard time catching her breath. Compliant with Symbicort 160 two puffs twice daily. She woke up three times last night to use her rescue inhaler. Reports fatigue with activity, decreased appetite and some mild diarrhea. She has not checked her temperature but does not feel as if she has a fever. She does not have a pulse oximeter to check her oxygen level. No known sick contacts but has been [employer]. Denies headache, loss of taste or  smell. She does not want to go to the emergency room but is aware she may need to.   2019 Jan Feb March April May June July Aug Sept Oct Nov Dec            X X X  2020 Jan Feb March April May June July Aug Sept Oct Nov Dec   X    X       09/11/2019     Observations/Objective:  - Mild-moderate dyspnea with speak; fatigue   Assessment and Plan:  Asthma exacerbation with bronchitis - Increased shortness of breath and cough x 1 week; symptoms worsening last several days - Last asthma exacerbation was May 2020 - RX zpack and prednisone taper (40mg  x 3 days; 30mg  x 3 days; 20mg  x 3 days; 10mg  x 3 days) - Continue Symbicort 160 two puffs twice daily - Continue Nucala 100mg  q 4 weeks - Continue Albuterol hfa/Atrovent neb every 6 hours for shortness of breath/wheezing - Advised patient to obtain pulse oximeter, if O2 <90% needs ED evaluation  - Needs covid testing   Follow Up Instructions:  - FU in 2 days televisit with NP - ED is symptoms worsen or O2 <90% RA   I discussed the assessment and treatment plan with the patient. The patient was provided an opportunity to ask questions and all were answered. The patient agreed with the plan and demonstrated an understanding of the instructions.   The patient was advised to call back or seek an in-person evaluation if the symptoms worsen or if the condition fails to improve as anticipated.  I provided 28 minutes of non-face-to-face time  during this encounter.   Martyn Ehrich, NP

## 2019-09-12 ENCOUNTER — Other Ambulatory Visit: Payer: Self-pay

## 2019-09-12 ENCOUNTER — Telehealth: Payer: Self-pay | Admitting: Primary Care

## 2019-09-12 ENCOUNTER — Ambulatory Visit: Payer: BC Managed Care – PPO | Attending: Internal Medicine

## 2019-09-12 DIAGNOSIS — Z20822 Contact with and (suspected) exposure to covid-19: Secondary | ICD-10-CM

## 2019-09-12 DIAGNOSIS — Z20828 Contact with and (suspected) exposure to other viral communicable diseases: Secondary | ICD-10-CM | POA: Diagnosis not present

## 2019-09-12 NOTE — Telephone Encounter (Signed)
Agree with COVID testing. Agree with seeking evaluation at ED she has O2 saturations persistently <88%. Thank you for monitoring her closely.

## 2019-09-12 NOTE — Telephone Encounter (Signed)
Called to check on patient. She has started antibiotic and prednisone. States that she does not feel as "winded and is able to get more of a breath". Reports O2 average has been 90-92% RA. Desaturates mid-high 80s with coughing fit but recovers.She is getting covid tested today at 1pm. We have set up another televisit tomorrow for 4pm. She is aware that she needs to seek emergency care if O2 sustaining <90%.

## 2019-09-13 ENCOUNTER — Ambulatory Visit (INDEPENDENT_AMBULATORY_CARE_PROVIDER_SITE_OTHER): Payer: BC Managed Care – PPO | Admitting: Primary Care

## 2019-09-13 DIAGNOSIS — Z792 Long term (current) use of antibiotics: Secondary | ICD-10-CM

## 2019-09-13 DIAGNOSIS — Z7952 Long term (current) use of systemic steroids: Secondary | ICD-10-CM

## 2019-09-13 DIAGNOSIS — J4551 Severe persistent asthma with (acute) exacerbation: Secondary | ICD-10-CM

## 2019-09-13 LAB — NOVEL CORONAVIRUS, NAA: SARS-CoV-2, NAA: NOT DETECTED

## 2019-09-13 NOTE — Progress Notes (Signed)
Virtual Visit via Telephone Note  I connected with Akansha T Martorano on 09/13/19 at  4:00 PM EST by telephone and verified that I am speaking with the correct person using two identifiers.  Location: Patient: Home Provider: [facility] office    I discussed the limitations, risks, security and privacy concerns of performing an evaluation and management service by telephone and the availability of in person appointments. I also discussed with the patient that there may be a patient responsible charge related to this service. The patient expressed understanding and agreed to proceed.  Synopsis:  Referred in 06/2018 for chronic productive cough and shortness of breath. Methacholine challenge 07/2018 consistent with asthma She has been on Nucala since June 2020  History of Present Illness: 53 year old female, never smoked. PMH significant for asthma, bronchitis, sinusitis, adrenal insufficiency. Patient of Dr. Loanne Drilling, last seen on 07/08/19. Maintained on Symbicort 160 and Nucala.   Previous [facility] pulmonary encounter: 09/11/2019 Patient contacted today for acute televisit, reports shortness of breath and cough x1 week. Symptoms worsening over the last few days. Cough is mostly dry during the day and then becomes productive at night. When she is able to get mucus up it is a golden/yellow color. She has tried benadryl, dayquil, nyquil, tessalon perles and mucinex with no improvement. States that her symptoms feel like a typical asthma exacerbation but she has been well controlled over the last year. Having a hard time catching her breath. Compliant with Symbicort 160 two puffs twice daily. She woke up three times last night to use her rescue inhaler. Reports fatigue with activity, decreased appetite and some mild diarrhea. She has not checked her temperature but does not feel as if she has a fever. She does not have a pulse oximeter to check her oxygen level. No known sick contacts but has been [employer]. Denies headache, loss of taste or smell. She does not want to go to the emergency room but is aware she may need to.   2019 Jan Feb March April May June July Aug Sept Oct Nov Dec            X X X  2020 Jan Feb March April May June July Aug Sept Oct Nov Dec   X    X       09/11/2019    09/12/19   Called to check on patient. She has started antibiotic and prednisone. States that she does not feel as "winded and is able to get more of a breath". Reports O2 average has been 90-92% RA. Desaturates mid-high 80s with coughing fit but recovers.She is getting covid tested today at 1pm. We have set up another televisit tomorrow for 4pm. She is aware that she needs to seek emergency care if O2 sustaining <90%     09/13/2019 Patient contacted today for 2 day follow-up televisit.She took her third dose of azithromycin and prednisone taper today. States that she is not worse but not better either. Reports becoming easily "winded" with walking or talking. No significant shortness of breath when at rest. Reports symptoms are worse at night. O2 90- 93% today. She has been using her rescue inhaler every 2 hours. Recommend ED evaluation d/t this, however, she states that she wants to avoiding going to ED unless absolutely necessary. She got covid tested yesterday, results are pending.    Observations/Objective: - Dyspnea on exertion - O2 90-93%   Assessment and Plan:  Severe persistent asthma with acute exacerbation - Remains stable.  Respiratory symptoms are not worse, experiences DOE with min exertion - Continues zpack and prednisone taper - Advised patient increased prednisone to 50mg  x 2 days then return to 40mg  taper - Covid testing negative  - ED if respiratory symptoms worsen or O2 sustaining <88%   Follow Up Instructions:   - Recommend office visit this coming week- needs CXR, labs and possible depo-medrol injection  I discussed the assessment and treatment plan  with the patient. The patient was provided an opportunity to ask questions and all were answered. The patient agreed with the plan and demonstrated an understanding of the instructions.   The patient was advised to call back or seek an in-person evaluation if the symptoms worsen or if the condition fails to improve as anticipated.  I provided 20 minutes of non-face-to-face time during this encounter.   Martyn Ehrich, NP

## 2019-09-14 DIAGNOSIS — J4551 Severe persistent asthma with (acute) exacerbation: Secondary | ICD-10-CM

## 2019-09-15 ENCOUNTER — Encounter: Payer: Self-pay | Admitting: Primary Care

## 2019-09-15 NOTE — Patient Instructions (Signed)
Increase prednisone 50mg  x 2 days; then return to 40mg  taper Covid negative Recommend office visit this week with CXR  ED if respiratory symptoms worsen or O2 sustaining <88%

## 2019-09-16 ENCOUNTER — Ambulatory Visit (INDEPENDENT_AMBULATORY_CARE_PROVIDER_SITE_OTHER): Payer: BC Managed Care – PPO

## 2019-09-16 DIAGNOSIS — J4551 Severe persistent asthma with (acute) exacerbation: Secondary | ICD-10-CM | POA: Diagnosis not present

## 2019-09-16 DIAGNOSIS — R05 Cough: Secondary | ICD-10-CM | POA: Diagnosis not present

## 2019-09-16 MED ORDER — PREDNISONE 10 MG PO TABS: ORAL_TABLET | ORAL | 0 refills | Status: DC

## 2019-09-16 NOTE — Telephone Encounter (Signed)
Patient sent another message back that she is wanting advise about.

## 2019-09-16 NOTE — Addendum Note (Signed)
Addended by: Lorretta Harp on: 09/16/2019 01:39 PM   Modules accepted: Orders

## 2019-09-16 NOTE — Progress Notes (Signed)
Please let patient know CXR was normal. Lungs clear.

## 2019-09-16 NOTE — Telephone Encounter (Signed)
Order placed for the cxr. Called and spoke with pt letting her know about this and pt verbalized understanding. Pt will be by office within next 88min or so to get cxr performed. Nothing further needed.

## 2019-09-16 NOTE — Telephone Encounter (Signed)
Have her get a CXR

## 2019-09-16 NOTE — Telephone Encounter (Signed)
So good to hear you are doing better, I will send in additional prednisone taper so you have enough to finish course. Do you want to come in this week for a visit some time or just let me know how you continue to do?

## 2019-09-16 NOTE — Telephone Encounter (Signed)
Beth, please see pt's mychart message. Thanks!

## 2019-09-23 ENCOUNTER — Telehealth: Payer: Self-pay | Admitting: Pulmonary Disease

## 2019-09-23 NOTE — Telephone Encounter (Signed)
Nucala Order: 100mg  #1 Vial Order Date: 09/23/19 Expected date of arrival: 09/23/19 Ordered by: 09/24/19 [facility]

## 2019-09-24 NOTE — Telephone Encounter (Signed)
Nucala Shipment Received: 100mg  #1 vial Medication arrival date: 09/24/2019 Lot #: [id number] Exp date: 10/2022 Received by: Desmond Dike, [place]

## 2019-09-30 ENCOUNTER — Other Ambulatory Visit: Payer: Self-pay

## 2019-09-30 ENCOUNTER — Ambulatory Visit (INDEPENDENT_AMBULATORY_CARE_PROVIDER_SITE_OTHER): Payer: BC Managed Care – PPO

## 2019-09-30 DIAGNOSIS — J4551 Severe persistent asthma with (acute) exacerbation: Secondary | ICD-10-CM | POA: Diagnosis not present

## 2019-09-30 MED ORDER — MEPOLIZUMAB 100 MG ~~LOC~~ SOLR
100.0000 mg | SUBCUTANEOUS | Status: DC
  Administered 2019-09-30: 100 mg via SUBCUTANEOUS

## 2019-09-30 NOTE — Progress Notes (Signed)
All questions were answered by the patient before medication was administered. Have you been hospitalized in the last 10 days? No Do you have a fever? No Do you have a cough? No Do you have a headache or sore throat? No  

## 2019-10-08 ENCOUNTER — Telehealth: Payer: Self-pay | Admitting: Pulmonary Disease

## 2019-10-08 DIAGNOSIS — F331 Major depressive disorder, recurrent, moderate: Secondary | ICD-10-CM | POA: Diagnosis not present

## 2019-10-08 MED ORDER — PROAIR HFA 108 (90 BASE) MCG/ACT IN AERS: 2.0000 | INHALATION_SPRAY | Freq: Four times a day (QID) | RESPIRATORY_TRACT | 5 refills | Status: DC | PRN

## 2019-10-08 NOTE — Telephone Encounter (Signed)
Refill of pt's rescue inhaler has been sent to preferred pharmacy for pt. Called and spoke with pt letting her know this had been done and she verbalized understanding. Nothing further needed.

## 2019-10-21 ENCOUNTER — Telehealth: Payer: Self-pay | Admitting: Pulmonary Disease

## 2019-10-21 NOTE — Telephone Encounter (Signed)
Nucala Order: 100mg  #1 Vial Order Date: 10/21/2019 Expected date of arrival: 10/22/2019 Ordered by: Desmond Dike, [place]  [facility]

## 2019-10-22 NOTE — Telephone Encounter (Signed)
Nucala Shipment Received: 100mg  #1 vial Medication arrival date: 10/22/2019 Lot #: [id number] Exp date: 07/2021 Received by: Desmond Dike, [place]

## 2019-10-28 ENCOUNTER — Ambulatory Visit (INDEPENDENT_AMBULATORY_CARE_PROVIDER_SITE_OTHER): Payer: BC Managed Care – PPO

## 2019-10-28 ENCOUNTER — Other Ambulatory Visit: Payer: Self-pay

## 2019-10-28 DIAGNOSIS — J4551 Severe persistent asthma with (acute) exacerbation: Secondary | ICD-10-CM | POA: Diagnosis not present

## 2019-10-28 DIAGNOSIS — F331 Major depressive disorder, recurrent, moderate: Secondary | ICD-10-CM | POA: Diagnosis not present

## 2019-10-28 MED ORDER — MEPOLIZUMAB 100 MG ~~LOC~~ SOLR
100.0000 mg | Freq: Once | SUBCUTANEOUS | Status: AC
  Administered 2019-10-28: 100 mg via SUBCUTANEOUS

## 2019-10-28 NOTE — Progress Notes (Signed)
Have you been hospitalized within the last 10 days?  No Do you have a fever?  No Do you have a cough?  No Do you have a headache or sore throat? No  

## 2019-11-18 ENCOUNTER — Telehealth: Payer: Self-pay | Admitting: Pulmonary Disease

## 2019-11-18 NOTE — Telephone Encounter (Signed)
Nucala Order: 100mg  #1 Vial Order Date: 11/18/2019 Expected date of arrival: 11/19/2019 Ordered by: Desmond Dike, [place]  [facility]

## 2019-11-19 NOTE — Telephone Encounter (Signed)
Nucala Shipment Received: 100mg  #1 vial Medication arrival date: 11/19/2019 Lot #: [id number] Exp date: 02/2023 Received by: Desmond Dike, [place]

## 2019-11-25 ENCOUNTER — Ambulatory Visit (INDEPENDENT_AMBULATORY_CARE_PROVIDER_SITE_OTHER): Payer: BC Managed Care – PPO

## 2019-11-25 ENCOUNTER — Other Ambulatory Visit: Payer: Self-pay

## 2019-11-25 DIAGNOSIS — J4551 Severe persistent asthma with (acute) exacerbation: Secondary | ICD-10-CM | POA: Diagnosis not present

## 2019-11-25 DIAGNOSIS — F331 Major depressive disorder, recurrent, moderate: Secondary | ICD-10-CM | POA: Diagnosis not present

## 2019-11-25 MED ORDER — MEPOLIZUMAB 100 MG ~~LOC~~ SOLR
100.0000 mg | SUBCUTANEOUS | Status: DC
  Administered 2019-11-25: 100 mg via SUBCUTANEOUS

## 2019-11-25 NOTE — Progress Notes (Signed)
All questions were answered by the patient before medication was administered. Have you been hospitalized in the last 10 days? No Do you have a fever? No Do you have a cough? No Do you have a headache or sore throat? No  

## 2019-12-06 DIAGNOSIS — H01139 Eczematous dermatitis of unspecified eye, unspecified eyelid: Secondary | ICD-10-CM | POA: Diagnosis not present

## 2019-12-06 DIAGNOSIS — J309 Allergic rhinitis, unspecified: Secondary | ICD-10-CM | POA: Diagnosis not present

## 2019-12-12 ENCOUNTER — Telehealth: Payer: Self-pay | Admitting: Pulmonary Disease

## 2019-12-12 ENCOUNTER — Ambulatory Visit: Payer: BC Managed Care – PPO

## 2019-12-12 MED ORDER — EPINEPHRINE 0.3 MG/0.3ML IJ SOAJ: 0.3000 mg | INTRAMUSCULAR | 11 refills | Status: DC | PRN

## 2019-12-12 NOTE — Progress Notes (Signed)
   Covid-19 Vaccination Clinic  Name:  [name] [name]    MRN: [id number] DOB: 07/12/1966  12/12/2019  [name] was observed post Covid-19 immunization for 15 minutes without incident. She was provided with Vaccine Information Sheet and instruction to access the V-Safe system.   [name] was instructed to call 911 with any severe reactions post vaccine: Marland Kitchen Difficulty breathing  . Swelling of face and throat  . A fast heartbeat  . A bad rash all over body  . Dizziness and weakness

## 2019-12-12 NOTE — Telephone Encounter (Signed)
Spoke with pt's husband, Yvone Neu. Pt is needing a new Epipen sent in. Rx has been sent in. Nothing further was needed.

## 2019-12-16 ENCOUNTER — Telehealth: Payer: Self-pay | Admitting: Pulmonary Disease

## 2019-12-16 DIAGNOSIS — F331 Major depressive disorder, recurrent, moderate: Secondary | ICD-10-CM | POA: Diagnosis not present

## 2019-12-16 NOTE — Telephone Encounter (Signed)
Nucala Order: 100mg  #1 Vial Order Date: 12/16/19 Expected date of arrival: 12/17/19 Ordered by: Lattie Haw.LPN [facility]

## 2019-12-17 NOTE — Telephone Encounter (Signed)
Nucala Shipment Received: 100mg  #1 vial Medication arrival date: 12/17/19 Lot #: [id number] Exp date: 02/25/2023 Received by: Elliot Dally

## 2019-12-23 ENCOUNTER — Ambulatory Visit: Payer: BC Managed Care – PPO

## 2019-12-23 ENCOUNTER — Ambulatory Visit: Payer: BC Managed Care – PPO | Admitting: Pulmonary Disease

## 2019-12-23 ENCOUNTER — Encounter: Payer: Self-pay | Admitting: Pulmonary Disease

## 2019-12-23 ENCOUNTER — Other Ambulatory Visit: Payer: Self-pay

## 2019-12-23 VITALS — BP 126/74 | HR 91 | Temp 97.2°F | Ht 69.0 in | Wt 221.0 lb

## 2019-12-23 DIAGNOSIS — Z7952 Long term (current) use of systemic steroids: Secondary | ICD-10-CM

## 2019-12-23 DIAGNOSIS — J4551 Severe persistent asthma with (acute) exacerbation: Secondary | ICD-10-CM

## 2019-12-23 DIAGNOSIS — J4550 Severe persistent asthma, uncomplicated: Secondary | ICD-10-CM

## 2019-12-23 MED ORDER — MEPOLIZUMAB 100 MG ~~LOC~~ SOLR
100.0000 mg | SUBCUTANEOUS | Status: DC
  Administered 2019-12-23: 12:00:00 100 mg via SUBCUTANEOUS

## 2019-12-23 MED ORDER — BUDESONIDE-FORMOTEROL FUMARATE 160-4.5 MCG/ACT IN AERO: 2.0000 | INHALATION_SPRAY | Freq: Two times a day (BID) | RESPIRATORY_TRACT | 6 refills | Status: DC

## 2019-12-23 NOTE — Patient Instructions (Signed)
Severe Persistent Eosinophilic Asthma, steroid dependent --CONTINUE Nucala. Will re-evaluate at the end of 2021 and pending # of exacerbations, we will consider changing biologics or continuing Nucala. --CONTINUE Symbicort --CONTINUE Albuterol as needed for shortness of breath or wheezing  Asthma Action Plan IF you develop asthma symptoms (chest tightness, cough and wheezing), take an extra 2 puffs of Symbicort as needed every 4 hours. Call us for persistent symptoms and we can order steroids.

## 2019-12-23 NOTE — Progress Notes (Signed)
Synopsis:  Referred in 06/2018 for chronic productive cough and shortness of breath. Methacholine challenge 07/2018 consistent with asthma  Subjective:   PATIENT ID: [name] [name] GENDER: female DOB: 1965/10/31, MRN: [id number]   HPI  Chief Complaint  Patient presents with  . Follow-up    3 month rov.  pt notes seasonal allergies starting to bother her.  c/o sinus congestion, pnd, prod cough with mostly clear with some yellow mucus.     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   She has been on Nucala since June 2020.  She is on Symbicort. She had to restart her benadryl and start taking her albuterol 2-3 times this week. For the last two days, she has had left ear fullness, mild headache, fatigue and nasal congestion. She also has had some dizziness. Also reports minimal non-productive cough but nothing more than would be triggered by her usual allergies. Denies shortness of breath or wheezing. Denies fevers, chills, no changes in taste or smell. She participates in graduate school on night a week and denies sick contacts. She recently went to [place] one week ago and felt they followed social distancing precautions.   Interval She reports allergy and congestionsymptoms in the fall and spring. She has been on Nucala since 2020. In December, she did have an asthma exacerbation associated with hypoxemia but did not seek ED attention at the time despite needing nebulizers. She was treated with steroids. COVID test was negative however she does confirm exposure to +COVID neighbor. She is doing well know and her oxygen is always above 96%.   2019 Jan Feb March April May June July Aug Sept Oct Nov Dec            X X X  2020 Jan Feb March April May June July Aug Sept Oct Nov Dec   X    X       X   Social History: Non-smoker She  has had recent social stressors. She has current stressors at home including her husband is being worked up for Parkinson's disease. She has held off on completing graduate school. Her stepfather's lung cancer was diagnosed as metastatic. Her brother and her are having difficulty getting information from family regarding care and treatment. They are concerned their mom might have dementia. Overall, a difficult situation regarding providing social support and goals of care.  Environmental exposures: No known exposures  I have personally reviewed patient's past medical/family/social history/allergies/current medications. Past Medical History:  Diagnosis Date  . 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 Arthritis    hnp-lumbar   . Blood dyscrasia    hx probable blood clot after ankle surgery  not definitive but treated per pt  . Cancer ([place])    "early evolvng melanoma "- legs   . Depression   . Diabetes insipidus ([place])   . Family history of adverse reaction to anesthesia    mom has N/V - zofran works  . Heart murmur    years ago-echo 05 no problems  . High cholesterol   . History of blood transfusion    "16 so far" (07/25/2012)  . History of bronchitis   . History of UTI   . Hypertension    no med for 1 yr  . 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 Migraines   . Peripheral vascular disease ([place])    ? dvt 2011 tx as preventive although did not show on scan after ankle surgery  . Pneumonia ~ 2009; 2010  . PONV (postoperative nausea and vomiting)    last 2 surgeries less nausea  . PONV (postoperative nausea and vomiting)    none with last surgery-be extra careful with nose patient haas septal hole      Family History  Problem Relation Age of Onset  . Hypertension Mother   . Coronary artery disease Mother   . Hypertension Father   . Lung cancer Father      Social History   Occupational History  . Not on file  Tobacco  Use  . Smoking status: Never Smoker  . Smokeless tobacco: Never Used  Substance and Sexual Activity  . Alcohol use: Yes    Alcohol/week: 2.0 standard drinks    Types: 2 Glasses of wine per week    Comment: occasional  . Drug use: No  . Sexual activity: Yes    Allergies  Allergen Reactions  . Amoxicillin Anaphylaxis, Hives and Itching  . Black Walnut Pollen Allergy Skin Test Anaphylaxis  . Penicillins Anaphylaxis    Has patient had a PCN reaction causing immediate rash, facial/tongue/throat swelling, SOB or lightheadedness with hypotension: Yes Has patient had a PCN reaction causing severe rash involving mucus membranes or skin necrosis: No Has patient had a PCN reaction that required hospitalization Yes Has patient had a PCN reaction occurring within the last 10 years: No If all of the above answers are "NO", then may proceed with Cephalosporin use.   . Sulfa Antibiotics Hives and Other (See Comments)    Hives, mouth blisters   . Chloroquine Other (See Comments)    Renal dysfunction  . Ciprofloxacin Hives  . Phenergan [Promethazine Hcl] Other (See Comments)    Caused "severe drop in blood pressure."  . Sulfamethoxazole Hives  . Other Other (See Comments)    From allergy testing--Canteloupe  . Shellfish Allergy Itching and Rash  . Vancomycin Cross Reactors Other (See Comments)    "RED MAN SYNDROME"  Pt stated she had no reaction to Vancomycin when it is given slow     Outpatient Medications Prior to Visit  Medication Sig Dispense Refill  . ADDERALL XR 20 MG 24 hr capsule Take 20 mg by mouth daily as needed (concentration).   0  . azithromycin (ZITHROMAX) 250 MG tablet Zpack taper as directed for asthma exacerbation with bronchitis 6 tablet 0  . budesonide-formoterol (SYMBICORT) 160-4.5 MCG/ACT inhaler Inhale 2 puffs into the lungs 2 (two) times daily. 1 Inhaler 6  . desmopressin (DDAVP) 0.1 MG tablet Take 0.1 mg by mouth 2 (two) times daily.    Marland Kitchen EPINEPHrine 0.3 mg/0.3 mL  IJ SOAJ injection Inject 0.3 mLs (0.3 mg total) into the muscle as needed for anaphylaxis. 0.3 mL 11  . estradiol (CLIMARA - DOSED IN MG/24 HR) 0.0375 mg/24hr patch Place 0.0375 mg onto the skin once a week. saturdays    . ipratropium (ATROVENT) 0.02 % nebulizer solution Take 2.5 mLs (0.5 mg total) by nebulization every 6 (six) hours as needed for wheezing or shortness of breath. icd- J42 75 mL 12  . montelukast (SINGULAIR) 10 MG tablet TAKE 1 TABLET BY MOUTH EVERYDAY AT BEDTIME 90 tablet 3  . OVER THE COUNTER MEDICATION Take 6 tablets by mouth daily. Smarty Pants Vitamins    . PROAIR HFA 108 (90 Base)  MCG/ACT inhaler Inhale 2 puffs into the lungs every 6 (six) hours as needed for wheezing or shortness of breath. 8 g 5  . Probiotic CAPS Take 1 capsule by mouth daily.     Marland Kitchen Respiratory Therapy Supplies (FLUTTER) DEVI Use flutter valve 3 times a day 1 each 0  . rosuvastatin (CRESTOR) 20 MG tablet Take 20 mg by mouth daily.    Marland Kitchen topiramate (TOPAMAX) 50 MG tablet Take 150 mg by mouth daily.    . valACYclovir (VALTREX) 1000 MG tablet Take two tablets as a single dose prn cold sore. (Patient taking differently: Take 2,000 mg by mouth daily as needed. Take two tablets as a single dose prn cold sore.) 20 tablet 1  . predniSONE (DELTASONE) 10 MG tablet Take 4 tabs po daily x 2 days; then 3 tabs daily x2 days; then 2 tabs daily x2 days; then 1 tab daily x 2 days; then stop (Patient not taking: Reported on 12/23/2019) 20 tablet 0   Facility-Administered Medications Prior to Visit  Medication Dose Route Frequency Provider Last Rate Last Admin  . Mepolizumab SOLR 100 mg  100 mg Subcutaneous Q28 days Margaretha Seeds, MD   100 mg at 04/16/19 1022  . Mepolizumab SOLR 100 mg  100 mg Subcutaneous Q28 days Margaretha Seeds, MD   100 mg at 05/13/19 [id number]  . Mepolizumab SOLR 100 mg  100 mg Subcutaneous Q28 days Margaretha Seeds, MD   100 mg at 06/10/19 [id number]  . Mepolizumab SOLR 100 mg  100 mg Subcutaneous Q28 days  Margaretha Seeds, MD   100 mg at 08/05/19 1001  . Mepolizumab SOLR 100 mg  100 mg Subcutaneous Q28 days Margaretha Seeds, MD   100 mg at 09/02/19 0954  . Mepolizumab SOLR 100 mg  100 mg Subcutaneous Q28 days Margaretha Seeds, MD   100 mg at 09/30/19 1141  . Mepolizumab SOLR 100 mg  100 mg Subcutaneous Q28 days Margaretha Seeds, MD   100 mg at 11/25/19 1215    Review of Systems  Constitutional: Negative for chills, diaphoresis, fever, malaise/fatigue and weight loss.  HENT: Negative for congestion.   Respiratory: Negative for cough, hemoptysis, sputum production, shortness of breath and wheezing.   Cardiovascular: Negative for chest pain, palpitations and leg swelling.  Endo/Heme/Allergies: Positive for environmental allergies.     Objective:   Vitals:   12/23/19 1148  BP: 126/74  Pulse: 91  Temp: (!) 97.2 F (36.2 C)  TempSrc: Temporal  SpO2: 96%  Weight: 221 lb (100.2 kg)  Height: 5\' 9"  (1.753 m)   SpO2: 96 % O2 Device: None (Room air)  Physical Exam: General: Well-appearing, no acute distress HENT: [place], AT Eyes: EOMI, no scleral icterus Respiratory: Diminished breath sounds bilaterally.  No crackles, wheezing or rales Cardiovascular: RRR, -M/R/G, no JVD GI: BS+, soft, nontender Extremities:-Edema,-tenderness Neuro: AAO x4, CNII-XII grossly intact Skin: Intact, no rashes or bruising Psych: Normal mood, normal affect  Labs Eosinophil 252 (3.4%) 09/25/18  Chest imaging: CXR 09/25/2018-normal chest x-ray with clear lung fields without evidence of infiltrate, effusion or edema  PFT:  08/07/2018-Mild bronchial hyper reactivity suggestive of asthma  Imaging, labs and test noted above have been reviewed independently by me.    Assessment & Plan:   54 year old female with severe persistent asthma for follow-up. Currently on monthly Nucala which was initiated on 02/19/19.  Severe Persistent Eosinophilic Asthma, steroid dependent --CONTINUE Nucala. Will re-evaluate  at the end of 2021 and  pending # of exacerbations, we will consider changing biologics or continuing Nucala. --CONTINUE Symbicort --CONTINUE Albuterol as needed for shortness of breath or wheezing  Asthma Action Plan IF you develop asthma symptoms (chest tightness, cough and wheezing), take an extra 2 puffs of Symbicort as needed every 4 hours. Call us for persistent symptoms and we can order steroids.  Immunization History  Administered Date(s) Administered  . Influenza,inj,Quad PF,6+ Mos 07/01/2016, 07/14/2017, 05/27/2019  . PFIZER SARS-COV-2 Vaccination 12/12/2019  . Tdap 07/14/2014   No orders of the defined types were placed in this encounter.  Meds ordered this encounter  Medications  . Mepolizumab SOLR 100 mg    Return in about 6 months (around 06/24/2020) for with me.  Khoa Opdahl Rodman Pickle, MD [facility] 12/23/2019 12:01 PM  Personal pager: [phone] If unanswered, please page CCM On-call: [phone]

## 2019-12-24 DIAGNOSIS — M25511 Pain in right shoulder: Secondary | ICD-10-CM | POA: Diagnosis not present

## 2019-12-24 DIAGNOSIS — R0789 Other chest pain: Secondary | ICD-10-CM | POA: Diagnosis not present

## 2019-12-24 DIAGNOSIS — I10 Essential (primary) hypertension: Secondary | ICD-10-CM | POA: Diagnosis not present

## 2019-12-24 DIAGNOSIS — Z1152 Encounter for screening for COVID-19: Secondary | ICD-10-CM | POA: Diagnosis not present

## 2019-12-24 DIAGNOSIS — R05 Cough: Secondary | ICD-10-CM | POA: Diagnosis not present

## 2019-12-24 DIAGNOSIS — Z8249 Family history of ischemic heart disease and other diseases of the circulatory system: Secondary | ICD-10-CM | POA: Diagnosis not present

## 2019-12-26 ENCOUNTER — Other Ambulatory Visit (HOSPITAL_COMMUNITY): Payer: Self-pay | Admitting: Registered Nurse

## 2019-12-27 ENCOUNTER — Telehealth: Payer: Self-pay

## 2019-12-27 NOTE — Telephone Encounter (Signed)
NOTES ON FILE FROM [facility] [phone], SENT REFERRAL TO SCHEDULING 

## 2019-12-31 DIAGNOSIS — F900 Attention-deficit hyperactivity disorder, predominantly inattentive type: Secondary | ICD-10-CM | POA: Diagnosis not present

## 2020-01-02 ENCOUNTER — Other Ambulatory Visit: Payer: Self-pay | Admitting: Nurse Practitioner

## 2020-01-02 ENCOUNTER — Other Ambulatory Visit (HOSPITAL_COMMUNITY): Payer: Self-pay | Admitting: Registered Nurse

## 2020-01-02 DIAGNOSIS — Z1231 Encounter for screening mammogram for malignant neoplasm of breast: Secondary | ICD-10-CM

## 2020-01-02 DIAGNOSIS — R0789 Other chest pain: Secondary | ICD-10-CM

## 2020-01-02 DIAGNOSIS — Z01419 Encounter for gynecological examination (general) (routine) without abnormal findings: Secondary | ICD-10-CM | POA: Diagnosis not present

## 2020-01-02 DIAGNOSIS — E2839 Other primary ovarian failure: Secondary | ICD-10-CM

## 2020-01-02 DIAGNOSIS — Z8249 Family history of ischemic heart disease and other diseases of the circulatory system: Secondary | ICD-10-CM

## 2020-01-06 ENCOUNTER — Ambulatory Visit: Payer: BC Managed Care – PPO | Attending: Internal Medicine

## 2020-01-06 DIAGNOSIS — Z23 Encounter for immunization: Secondary | ICD-10-CM

## 2020-01-06 NOTE — Progress Notes (Signed)
   Covid-19 Vaccination Clinic  Name:  [name] [name]    MRN: [id number] DOB: 24-Feb-1966  01/06/2020  [name] was observed post Covid-19 immunization for 30 minutes based on pre-vaccination screening without incident. She was provided with Vaccine Information Sheet and instruction to access the V-Safe system.   [name] was instructed to call 911 with any severe reactions post vaccine: Marland Kitchen Difficulty breathing  . Swelling of face and throat  . A fast heartbeat  . A bad rash all over body  . Dizziness and weakness   Immunizations Administered    Name Date Dose VIS Date Route   Pfizer COVID-19 Vaccine 01/06/2020 11:33 AM 0.3 mL 09/06/2019 Intramuscular   Manufacturer: [place]   Lot: [id number]   [place]: [id number]

## 2020-01-13 ENCOUNTER — Telehealth: Payer: Self-pay | Admitting: Pulmonary Disease

## 2020-01-13 NOTE — Telephone Encounter (Signed)
Nucala Order: 100mg  #1 Vial Order Date: 01/13/20 Expected date of arrival: 01/14/20 Ordered by: [place]: [facility]

## 2020-01-14 ENCOUNTER — Encounter (HOSPITAL_COMMUNITY): Payer: Self-pay | Admitting: Registered Nurse

## 2020-01-14 NOTE — Telephone Encounter (Signed)
Nucala Shipment Received: 100mg  #1 vial Medication arrival date: 01/14/20 Lot #: [id number] Exp date: 03/26/2023 Received by: Elliot Dally

## 2020-01-16 ENCOUNTER — Ambulatory Visit: Payer: BC Managed Care – PPO | Admitting: Physician Assistant

## 2020-01-20 ENCOUNTER — Ambulatory Visit: Payer: BC Managed Care – PPO

## 2020-01-29 ENCOUNTER — Ambulatory Visit
Admission: RE | Admit: 2020-01-29 | Discharge: 2020-01-29 | Disposition: A | Discharge status: Home / Self Care | Payer: BC Managed Care – PPO | Source: Ambulatory Visit | Attending: Nurse Practitioner | Admitting: Nurse Practitioner

## 2020-01-29 ENCOUNTER — Other Ambulatory Visit: Payer: Self-pay

## 2020-01-29 ENCOUNTER — Ambulatory Visit (INDEPENDENT_AMBULATORY_CARE_PROVIDER_SITE_OTHER): Payer: BC Managed Care – PPO

## 2020-01-29 DIAGNOSIS — M85851 Other specified disorders of bone density and structure, right thigh: Secondary | ICD-10-CM | POA: Diagnosis not present

## 2020-01-29 DIAGNOSIS — J4550 Severe persistent asthma, uncomplicated: Secondary | ICD-10-CM | POA: Diagnosis not present

## 2020-01-29 DIAGNOSIS — Z1231 Encounter for screening mammogram for malignant neoplasm of breast: Secondary | ICD-10-CM | POA: Diagnosis not present

## 2020-01-29 DIAGNOSIS — Z780 Asymptomatic menopausal state: Secondary | ICD-10-CM | POA: Diagnosis not present

## 2020-01-29 DIAGNOSIS — E2839 Other primary ovarian failure: Secondary | ICD-10-CM

## 2020-01-29 MED ORDER — MEPOLIZUMAB 100 MG ~~LOC~~ SOLR
100.0000 mg | Freq: Once | SUBCUTANEOUS | Status: AC
  Administered 2020-01-29: 100 mg via SUBCUTANEOUS

## 2020-01-29 NOTE — Progress Notes (Signed)
Have you been hospitalized within the last 10 days?  No Do you have a fever?  No Do you have a cough?  No Do you have a headache or sore throat? No Do you have your Epi Pen visible and is it within date?  Yes 

## 2020-02-06 ENCOUNTER — Telehealth (HOSPITAL_COMMUNITY): Payer: Self-pay

## 2020-02-06 NOTE — Telephone Encounter (Signed)
Encounter complete. 

## 2020-02-08 ENCOUNTER — Other Ambulatory Visit (HOSPITAL_COMMUNITY)
Admission: RE | Admit: 2020-02-08 | Discharge: 2020-02-08 | Disposition: A | Discharge status: Home / Self Care | Payer: BC Managed Care – PPO | Source: Ambulatory Visit | Attending: Internal Medicine | Admitting: Internal Medicine

## 2020-02-08 DIAGNOSIS — Z20822 Contact with and (suspected) exposure to covid-19: Secondary | ICD-10-CM | POA: Diagnosis not present

## 2020-02-08 DIAGNOSIS — Z01812 Encounter for preprocedural laboratory examination: Secondary | ICD-10-CM | POA: Insufficient documentation

## 2020-02-08 LAB — SARS CORONAVIRUS 2 (TAT 6-24 HRS): SARS Coronavirus 2: NEGATIVE

## 2020-02-11 ENCOUNTER — Telehealth (HOSPITAL_COMMUNITY): Payer: Self-pay

## 2020-02-11 NOTE — Telephone Encounter (Signed)
Encounter complete. 

## 2020-02-12 ENCOUNTER — Ambulatory Visit (HOSPITAL_COMMUNITY)
Admission: RE | Admit: 2020-02-12 | Discharge: 2020-02-12 | Disposition: A | Discharge status: Home / Self Care | Payer: BC Managed Care – PPO | Source: Ambulatory Visit | Attending: Cardiology | Admitting: Cardiology

## 2020-02-12 ENCOUNTER — Other Ambulatory Visit: Payer: Self-pay

## 2020-02-12 ENCOUNTER — Telehealth (HOSPITAL_COMMUNITY): Payer: Self-pay

## 2020-02-12 ENCOUNTER — Encounter: Payer: Self-pay | Admitting: Cardiology

## 2020-02-12 DIAGNOSIS — Z8249 Family history of ischemic heart disease and other diseases of the circulatory system: Secondary | ICD-10-CM | POA: Diagnosis not present

## 2020-02-12 DIAGNOSIS — R0789 Other chest pain: Secondary | ICD-10-CM | POA: Insufficient documentation

## 2020-02-12 LAB — EXERCISE TOLERANCE TEST
Estimated workload: 7.8 METS
Exercise duration (min): 6 min
Exercise duration (sec): 33 s
MPHR: 167 {beats}/min
Peak HR: 164 {beats}/min
Percent HR: 98 %
RPE: 17
Rest HR: 86 {beats}/min

## 2020-02-12 NOTE — Telephone Encounter (Signed)
Encounter complete. 

## 2020-02-13 DIAGNOSIS — E7849 Other hyperlipidemia: Secondary | ICD-10-CM | POA: Diagnosis not present

## 2020-02-13 DIAGNOSIS — I10 Essential (primary) hypertension: Secondary | ICD-10-CM | POA: Diagnosis not present

## 2020-02-13 DIAGNOSIS — Z0000 Encounter for general adult medical examination without abnormal findings: Secondary | ICD-10-CM | POA: Diagnosis not present

## 2020-02-13 DIAGNOSIS — R7301 Impaired fasting glucose: Secondary | ICD-10-CM | POA: Diagnosis not present

## 2020-02-17 ENCOUNTER — Telehealth: Payer: Self-pay | Admitting: Pulmonary Disease

## 2020-02-17 NOTE — Telephone Encounter (Signed)
Nucala Order: 100mg #1 Vial Order Date: 02/17/2020 Expected date of arrival: 02/18/2020 Ordered by: Mohamedamin Nifong, CMA  Specialty Pharmacy: [facility]  

## 2020-02-18 NOTE — Telephone Encounter (Signed)
Nucala Shipment Received: 100mg #1 vial Medication arrival date: 02/18/2020 Lot #: [id number] Exp date: 06/2023 Received by: Giselle Brutus, CMA   

## 2020-02-19 DIAGNOSIS — M8588 Other specified disorders of bone density and structure, other site: Secondary | ICD-10-CM | POA: Diagnosis not present

## 2020-02-20 DIAGNOSIS — E785 Hyperlipidemia, unspecified: Secondary | ICD-10-CM | POA: Diagnosis not present

## 2020-02-20 DIAGNOSIS — Z1331 Encounter for screening for depression: Secondary | ICD-10-CM | POA: Diagnosis not present

## 2020-02-20 DIAGNOSIS — M8580 Other specified disorders of bone density and structure, unspecified site: Secondary | ICD-10-CM | POA: Diagnosis not present

## 2020-02-20 DIAGNOSIS — Z8249 Family history of ischemic heart disease and other diseases of the circulatory system: Secondary | ICD-10-CM | POA: Diagnosis not present

## 2020-02-20 DIAGNOSIS — Z0000 Encounter for general adult medical examination without abnormal findings: Secondary | ICD-10-CM | POA: Diagnosis not present

## 2020-02-20 DIAGNOSIS — I10 Essential (primary) hypertension: Secondary | ICD-10-CM | POA: Diagnosis not present

## 2020-02-20 DIAGNOSIS — R7301 Impaired fasting glucose: Secondary | ICD-10-CM | POA: Diagnosis not present

## 2020-02-26 ENCOUNTER — Ambulatory Visit: Payer: BC Managed Care – PPO

## 2020-03-02 ENCOUNTER — Other Ambulatory Visit: Payer: Self-pay

## 2020-03-02 ENCOUNTER — Ambulatory Visit (INDEPENDENT_AMBULATORY_CARE_PROVIDER_SITE_OTHER): Payer: BC Managed Care – PPO

## 2020-03-02 DIAGNOSIS — J4550 Severe persistent asthma, uncomplicated: Secondary | ICD-10-CM

## 2020-03-02 MED ORDER — MEPOLIZUMAB 100 MG ~~LOC~~ SOLR
100.0000 mg | SUBCUTANEOUS | Status: DC
  Administered 2020-03-02: 100 mg via SUBCUTANEOUS

## 2020-03-02 NOTE — Progress Notes (Signed)
All questions were answered by the patient before medication was administered. Have you been hospitalized in the last 10 days? No Do you have a fever? No Do you have a cough? No Do you have a headache or sore throat? No  

## 2020-03-10 ENCOUNTER — Encounter: Payer: Self-pay | Admitting: Physician Assistant

## 2020-03-10 ENCOUNTER — Ambulatory Visit: Payer: BC Managed Care – PPO | Admitting: Physician Assistant

## 2020-03-10 ENCOUNTER — Other Ambulatory Visit: Payer: Self-pay

## 2020-03-10 DIAGNOSIS — Z86006 Personal history of melanoma in-situ: Secondary | ICD-10-CM

## 2020-03-10 DIAGNOSIS — Z1283 Encounter for screening for malignant neoplasm of skin: Secondary | ICD-10-CM

## 2020-03-10 NOTE — Progress Notes (Signed)
   Follow-Up Visit   Subjective  [name] [name] is a 54 y.o. female who presents for the following: Annual Exam (NO NEW CONCERNS).   The following portions of the chart were reviewed this encounter and updated as appropriate: Tobacco  Allergies  Meds  Problems  Med Hx  Surg Hx  Fam Hx      Objective  Well appearing patient in no apparent distress; mood and affect are within normal limits.  A focused examination was performed including head to toe exam. Relevant physical exam findings are noted in the Assessment and Plan.  Objective  Head - Anterior (Face): No DN or signs of NMSC  Objective  Left Forearm - Posterior: Scar clear, No DN   Assessment & Plan  Screening exam for skin cancer Head - Anterior (Face)  Yearly skin exams  Personal history of melanoma in-situ Left Forearm - Posterior  Yearly skin exam

## 2020-03-27 ENCOUNTER — Ambulatory Visit (INDEPENDENT_AMBULATORY_CARE_PROVIDER_SITE_OTHER): Payer: BC Managed Care – PPO

## 2020-03-27 ENCOUNTER — Other Ambulatory Visit: Payer: Self-pay

## 2020-03-27 ENCOUNTER — Ambulatory Visit: Payer: BC Managed Care – PPO | Admitting: Specialist

## 2020-03-27 ENCOUNTER — Encounter: Payer: Self-pay | Admitting: Specialist

## 2020-03-27 ENCOUNTER — Ambulatory Visit: Payer: Self-pay

## 2020-03-27 VITALS — BP 135/88 | HR 80 | Ht 69.0 in | Wt 221.0 lb

## 2020-03-27 DIAGNOSIS — M6701 Short Achilles tendon (acquired), right ankle: Secondary | ICD-10-CM

## 2020-03-27 DIAGNOSIS — M7661 Achilles tendinitis, right leg: Secondary | ICD-10-CM | POA: Diagnosis not present

## 2020-03-27 DIAGNOSIS — M722 Plantar fascial fibromatosis: Secondary | ICD-10-CM | POA: Diagnosis not present

## 2020-03-27 DIAGNOSIS — M25571 Pain in right ankle and joints of right foot: Secondary | ICD-10-CM

## 2020-03-27 MED ORDER — DICLOFENAC SODIUM 1 % EX GEL: 4.0000 g | Freq: Four times a day (QID) | CUTANEOUS | 3 refills | Status: DC

## 2020-03-27 NOTE — Addendum Note (Signed)
Addended by: Basil Dess on: 03/27/2020 01:06 PM   Modules accepted: Orders

## 2020-03-27 NOTE — Progress Notes (Addendum)
Office Visit Note   Patient: [name] [name]           Date of Birth: [birth year]           MRN: [id number] Visit Date: 03/27/2020              Requested by: Prince Solian, MD [address] [place],  [place] [address] PCP: Prince Solian, MD   Assessment & Plan: Visit Diagnoses:  1. Pain in right ankle and joints of right foot   2. Contracture of right Achilles tendon   3. Plantar fasciitis, right   4. Right Achilles bursitis     Plan:  Plantar Fasciitis Rehab Ask your health care provider which exercises are safe for you. Do exercises exactly as told by your health care provider and adjust them as directed. It is normal to feel mild stretching, pulling, tightness, or discomfort as you do these exercises. Stop right away if you feel sudden pain or your pain gets worse. Do not begin these exercises until told by your health care provider. Stretching and range-of-motion exercises These exercises warm up your muscles and joints and improve the movement and flexibility of your foot. These exercises also help to relieve pain. Plantar fascia stretch  1. Sit with your left / right leg crossed over your opposite knee. 2. Hold your heel with one hand with that thumb near your arch. With your other hand, hold your toes and gently pull them back toward the top of your foot. You should feel a stretch on the bottom of your toes or your foot (plantar fascia) or both. 3. Hold this stretch for__________ seconds. 4. Slowly release your toes and return to the starting position. Repeat __________ times. Complete this exercise __________ times a day. Gastrocnemius stretch, standing This exercise is also called a calf (gastroc) stretch. It stretches the muscles in the back of the upper calf. 1. Stand with your hands against a wall. 2. Extend your left / right leg behind you, and bend your front knee slightly. 3. Keeping your heels on the floor and your back knee straight, shift your weight  toward the wall. Do not arch your back. You should feel a gentle stretch in your upper left / right calf. 4. Hold this position for __________ seconds. Repeat __________ times. Complete this exercise __________ times a day. Soleus stretch, standing This exercise is also called a calf (soleus) stretch. It stretches the muscles in the back of the lower calf. 1. Stand with your hands against a wall. 2. Extend your left / right leg behind you, and bend your front knee slightly. 3. Keeping your heels on the floor, bend your back knee and shift your weight slightly over your back leg. You should feel a gentle stretch deep in your lower calf. 4. Hold this position for __________ seconds. Repeat __________ times. Complete this exercise __________ times a day. Gastroc and soleus stretch, standing step This exercise stretches the muscles in the back of the lower leg. These muscles are in the upper calf (gastrocnemius) and the lower calf (soleus). 1. Stand with the ball of your left / right foot on a step. The ball of your foot is on the walking surface, right under your toes. 2. Keep your other foot firmly on the same step. 3. Hold on to the wall or a railing for balance. 4. Slowly lift your other foot, allowing your body weight to press your left / right heel down over the edge of the  step. You should feel a stretch in your left / right calf. 5. Hold this position for __________ seconds. 6. Return both feet to the step. 7. Repeat this exercise with a slight bend in your left / right knee. Repeat __________ times with your left / right knee straight and __________ times with your left / right knee bent. Complete this exercise __________ times a day. Balance exercise This exercise builds your balance and strength control of your arch to help take pressure off your plantar fascia. Single leg stand If this exercise is too easy, you can try it with your eyes closed or while standing on a pillow. 1. Without  shoes, stand near a railing or in a doorway. You may hold on to the railing or door frame as needed. 2. Stand on your left / right foot. Keep your big toe down on the floor and try to keep your arch lifted. Do not let your foot roll inward. 3. Hold this position for __________ seconds. Repeat __________ times. Complete this exercise __________ times a day. This information is not intended to replace advice given to you by your health care provider. Make sure you discuss any questions you have with your health care provider. Document Revised: 01/03/2019 Document Reviewed: 07/11/2018 Elsevier Patient Education  [employer]. Plantar Fasciitis  Plantar fasciitis is a painful foot condition that affects the heel. It occurs when the band of tissue that connects the toes to the heel bone (plantar fascia) becomes irritated. This can happen as the result of exercising too much or doing other repetitive activities (overuse injury). The pain from plantar fasciitis can range from mild irritation to severe pain that makes it difficult to walk or move. The pain is usually worse in the morning after sleeping, or after sitting or lying down for a while. Pain may also be worse after long periods of walking or standing. What are the causes? This condition may be caused by:  Standing for long periods of time.  Wearing shoes that do not have good arch support.  Doing activities that put stress on joints (high-impact activities), including running, aerobics, and ballet.  Being overweight.  An abnormal way of walking (gait).  Tight muscles in the back of your lower leg (calf).  High arches in your feet.  Starting a new athletic activity. What are the signs or symptoms? The main symptom of this condition is heel pain. Pain may:  Be worse with first steps after a time of rest, especially in the morning after sleeping or after you have been sitting or lying down for a while.  Be worse after long  periods of standing still.  Decrease after 30-45 minutes of activity, such as gentle walking. How is this diagnosed? This condition may be diagnosed based on your medical history and your symptoms. Your health care provider may ask questions about your activity level. Your health care provider will do a physical exam to check for:  A tender area on the bottom of your foot.  A high arch in your foot.  Pain when you move your foot.  Difficulty moving your foot. You may have imaging tests to confirm the diagnosis, such as:  X-rays.  Ultrasound.  MRI. How is this treated? Treatment for plantar fasciitis depends on how severe your condition is. Treatment may include:  Rest, ice, applying pressure (compression), and raising the affected foot (elevation). This may be called RICE therapy. Your health care provider may recommend RICE therapy along with  over-the-counter pain medicines to manage your pain.  Exercises to stretch your calves and your plantar fascia.  A splint that holds your foot in a stretched, upward position while you sleep (night splint).  Physical therapy to relieve symptoms and prevent problems in the future.  Injections of steroid medicine (cortisone) to relieve pain and inflammation.  Stimulating your plantar fascia with electrical impulses (extracorporeal shock wave therapy). This is usually the last treatment option before surgery.  Surgery, if other treatments have not worked after 12 months. Follow these instructions at home:  Managing pain, stiffness, and swelling  If directed, put ice on the painful area: ? Put ice in a plastic bag, or use a frozen bottle of water. ? Place a towel between your skin and the bag or bottle. ? Roll the bottom of your foot over the bag or bottle. ? Do this for 20 minutes, 2-3 times a day.  Wear athletic shoes that have air-sole or gel-sole cushions, or try wearing soft shoe inserts that are designed for plantar  fasciitis.  Raise (elevate) your foot above the level of your heart while you are sitting or lying down. Activity  Avoid activities that cause pain. Ask your health care provider what activities are safe for you.  Do physical therapy exercises and stretches as told by your health care provider.  Try activities and forms of exercise that are easier on your joints (low-impact). Examples include swimming, water aerobics, and biking. General instructions  Take over-the-counter and prescription medicines only as told by your health care provider.  Wear a night splint while sleeping, if told by your health care provider. Loosen the splint if your toes tingle, become numb, or turn cold and blue.  Maintain a healthy weight, or work with your health care provider to lose weight as needed.  Keep all follow-up visits as told by your health care provider. This is important. Contact a health care provider if you:  Have symptoms that do not go away after caring for yourself at home.  Have pain that gets worse.  Have pain that affects your ability to move or do your daily activities. Summary  Plantar fasciitis is a painful foot condition that affects the heel. It occurs when the band of tissue that connects the toes to the heel bone (plantar fascia) becomes irritated.  The main symptom of this condition is heel pain that may be worse after exercising too much or standing still for a long time.  Treatment varies, but it usually starts with rest, ice, compression, and elevation (RICE therapy) and over-the-counter medicines to manage pain. This information is not intended to replace advice given to you by your health care provider. Make sure you discuss any questions you have with your health care provider. Document Revised: 08/25/2017 Document Reviewed: 07/10/2017 Elsevier Patient Education  [place]. Voltaren gel 1% apply 2-4 gm up to 3-4 times per day. Heel cord stretching  exercises. Go to the [employer] and obtain shoe with adequate arch support and well padded heel.  Follow-Up Instructions: Return in about 3 weeks (around 04/17/2020).   Orders:  Orders Placed This Encounter  Procedures  . XR Ankle Complete Right  . XR Foot 2 Views Right   No orders of the defined types were placed in this encounter.     Procedures: No procedures performed   Clinical Data: No additional findings.   Subjective: Chief Complaint  Patient presents with  . Right Ankle - Pain  54 year old female with history of multiple level disc herniations mainly L4-5 where she had 3-4 consecutive protrusions ultimately resulting in a lumbar fusion for recurring disc herniations. With one of the protrusions she had a right foot drop preoperatively that improved post op but has had some residual right foot DF weakness. Her complaints today is increasing pain into the right foot heel and associated right posterior foot and heel swelling. She and her husband have returned recently from a [place] trip and she is in good spirits unfortunately  She is experiencing increased right foot heel pain. In the past she saw Dr. Maryclare Labrador at [facility] for right peroneal tendon repair ? Repair of a subluxation of the tendons at the right vs repair of a ruptured peroneus tendons. She has done well post procedure and has over the last year seen increasing pain in the right heel and plantar heel    Review of Systems  Constitutional: Positive for activity change and unexpected weight change.  HENT: Negative.   Eyes: Negative.  Negative for photophobia and visual disturbance.  Respiratory: Negative.   Cardiovascular: Negative.   Gastrointestinal: Negative.   Endocrine: Positive for polydipsia and polyuria.  Genitourinary: Negative.   Musculoskeletal: Negative.   Skin: Negative.   Allergic/Immunologic: Negative.   Neurological: Negative.   Hematological: Negative.   Psychiatric/Behavioral: Negative.       Objective: Vital Signs: BP 135/88   Pulse 80   Ht 5\' 9"  (1.753 m)   Wt 221 lb (100.2 kg)   BMI 32.64 kg/m   Physical Exam  Right Ankle Exam   Tenderness  The patient is experiencing tenderness in the lateral malleolus. Swelling: mild  Range of Motion  Dorsiflexion:  0 abnormal  Plantar flexion:  40 normal  Eversion: normal  Inversion: normal   Muscle Strength  Dorsiflexion:  4/5 Plantar flexion:  5/5 Anterior tibial:  4/5 Posterior tibial:  5/5 Gastrocsoleus:  5/5 Peroneal muscle:  5/5  Tests  Anterior drawer: negative Varus tilt: negative  Other  Erythema: absent Scars: present Sensation: normal Pulse: present   Comments:  Tight heel cord, plantar flexion with knee in full extension.  Swelling preachilles bursa Tender right plantar insertion site of plantar fascia. Pain improves with placement of a 5/16th inch heel lift and a medium LAS HAPAD.      Specialty Comments:  No specialty comments available.  Imaging: No results found.   PMFS History: Patient Active Problem List   Diagnosis Date Noted  . Spinal stenosis of lumbar region 12/08/2016    Priority: High    Class: Chronic  . Herniated intervertebral disc of lumbar spine 12/06/2016    Priority: High    Class: Acute  . Spinal stenosis of lumbar region with neurogenic claudication 03/19/2012    Priority: High    Class: Acute  . Hypokalemia 10/21/2015    Priority: Medium  . Spinal stenosis in cervical region 10/21/2015    Priority: Medium    Class: Chronic  . Pituitary mass ([place]) 10/21/2015    Priority: Medium  . Herniated nucleus pulposus, lumbar 07/25/2012    Priority: Medium    Class: Acute  . HNP (herniated nucleus pulposus), lumbar 03/19/2012    Priority: Medium    Class: Acute  . Severe persistent asthma with acute exacerbation 05/27/2019  . Severe persistent asthma dependent on systemic steroids without complication 97/10/6376  . Dyspnea on exertion 09/26/2018  .  Bronchitis 08/22/2018  . Acute non-recurrent maxillary sinusitis 08/22/2018  . Adrenal insufficiency (  [place]) 12/08/2016  . History of pituitary adenoma 12/08/2016  . Recurrent herniation of lumbar disc 12/06/2016  . Trochanteric bursitis, right hip 09/08/2016  . S/P lumbar discectomy 10/16/2015  . History of lumbar laminectomy for spinal cord decompression 09/23/2014  . Herniated nucleus pulposus, L4-5 02/25/2014   Past Medical History:  Diagnosis Date  . 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 Arthritis    hnp-lumbar   . Blood dyscrasia    hx probable blood clot after ankle surgery  not definitive but treated per pt  . Cancer ([place])    "early evolvng melanoma "- legs   . Depression   . Diabetes insipidus ([place])   . Family history of adverse reaction to anesthesia    mom has N/V - zofran works  . Heart murmur    years ago-echo 05 no problems  . High cholesterol   . History of blood transfusion    "16 so far" (07/25/2012)  . History of bronchitis   . History of UTI   . Hypertension    no med for 1 yr  . 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 Migraines   . Peripheral vascular disease ([place])    ? dvt 2011 tx as preventive although did not show on scan after ankle surgery  . Pneumonia ~ 2009; 2010  . PONV (postoperative nausea and vomiting)    last 2 surgeries less nausea  . PONV (postoperative nausea and vomiting)    none with last surgery-be extra careful with nose patient haas septal hole     Family History  Problem Relation Age of Onset  . Hypertension Mother   . Coronary artery disease Mother   . Hypertension Father   . Lung cancer Father     Past Surgical History:  Procedure Laterality Date  . APPENDECTOMY     denies  . BLADDER SUSPENSION  2006  . BREAST BIOPSY  ~ 2010   left breast, benign   . BREAST LUMPECTOMY  ~ 2010   left  . CHOLECYSTECTOMY  2004  . COLONOSCOPY    . DIAGNOSTIC LAPAROSCOPY     2  cysts removed fr. R ovary   . LAMINECTOMY AND MICRODISCECTOMY LUMBAR SPINE  07/25/2012   redo right L4-5 microdiscectomy  . LUMBAR FUSION  12/06/2016   L4 L 5  . LUMBAR LAMINECTOMY  03/19/2012   Procedure: MICRODISCECTOMY LUMBAR LAMINECTOMY;  Surgeon: [name], MD;  Location: [facility];  Service: Orthopedics;  Laterality: Right;  MIS Right L3-4 lateral recess decompression and Right L4-5 Microdiscectomy  . LUMBAR LAMINECTOMY  07/25/2012   Procedure: MICRODISCECTOMY LUMBAR LAMINECTOMY;  Surgeon: [name], MD;  Location: [facility];  Service: Orthopedics;  Laterality: N/A;  Re-do Right L4-5 Microdiscectomy  . LUMBAR LAMINECTOMY N/A 02/25/2014   Procedure: Bilateral L4-5 MICRODISCECTOMY with MIS;  Surgeon: [name], MD;  Location: [facility];  Service: Orthopedics;  Laterality: N/A;  . LUMBAR LAMINECTOMY N/A 09/23/2014   Procedure: RIGHT L4-5 AND L5-S1 MICRODISCECTOMIES;  Surgeon: [name], MD;  Location: [facility];  Service: Orthopedics;  Laterality: N/A;  . LUMBAR LAMINECTOMY N/A 10/16/2015   Procedure: REDO MICRODISCECTOMY L4-5;  Surgeon: [name], MD;  Location: [facility];  Service: Orthopedics;  Laterality: N/A;  . MELANOMA EXCISION  06/2011   right lower leg; "early evolving"  . REPAIR PERONEAL TENDONS ANKLE  2010   right  . SHOULDER ARTHROSCOPY  ~  2009   "frozen shoulder manipulation; right" (07/25/2012)  . TONSILLECTOMY  1977  . TRANSPHENOIDAL PITUITARY RESECTION  04/19/17   at [facility]   have Ovaries   Social History   Occupational History  . Not on file  Tobacco Use  . Smoking status: Never Smoker  . Smokeless tobacco: Never Used  Vaping Use  . Vaping Use: Never used  Substance and Sexual Activity  . Alcohol use: Yes    Alcohol/week: 2.0 standard drinks    Types: 2 Glasses of wine per week    Comment: occasional  . Drug use: No  . Sexual activity: Yes

## 2020-03-27 NOTE — Patient Instructions (Signed)
Plantar Fasciitis Rehab Ask your health care provider which exercises are safe for you. Do exercises exactly as told by your health care provider and adjust them as directed. It is normal to feel mild stretching, pulling, tightness, or discomfort as you do these exercises. Stop right away if you feel sudden pain or your pain gets worse. Do not begin these exercises until told by your health care provider. Stretching and range-of-motion exercises These exercises warm up your muscles and joints and improve the movement and flexibility of your foot. These exercises also help to relieve pain. Plantar fascia stretch  1. Sit with your left / right leg crossed over your opposite knee. 2. Hold your heel with one hand with that thumb near your arch. With your other hand, hold your toes and gently pull them back toward the top of your foot. You should feel a stretch on the bottom of your toes or your foot (plantar fascia) or both. 3. Hold this stretch for__________ seconds. 4. Slowly release your toes and return to the starting position. Repeat __________ times. Complete this exercise __________ times a day. Gastrocnemius stretch, standing This exercise is also called a calf (gastroc) stretch. It stretches the muscles in the back of the upper calf. 1. Stand with your hands against a wall. 2. Extend your left / right leg behind you, and bend your front knee slightly. 3. Keeping your heels on the floor and your back knee straight, shift your weight toward the wall. Do not arch your back. You should feel a gentle stretch in your upper left / right calf. 4. Hold this position for __________ seconds. Repeat __________ times. Complete this exercise __________ times a day. Soleus stretch, standing This exercise is also called a calf (soleus) stretch. It stretches the muscles in the back of the lower calf. 1. Stand with your hands against a wall. 2. Extend your left / right leg behind you, and bend your front  knee slightly. 3. Keeping your heels on the floor, bend your back knee and shift your weight slightly over your back leg. You should feel a gentle stretch deep in your lower calf. 4. Hold this position for __________ seconds. Repeat __________ times. Complete this exercise __________ times a day. Gastroc and soleus stretch, standing step This exercise stretches the muscles in the back of the lower leg. These muscles are in the upper calf (gastrocnemius) and the lower calf (soleus). 1. Stand with the ball of your left / right foot on a step. The ball of your foot is on the walking surface, right under your toes. 2. Keep your other foot firmly on the same step. 3. Hold on to the wall or a railing for balance. 4. Slowly lift your other foot, allowing your body weight to press your left / right heel down over the edge of the step. You should feel a stretch in your left / right calf. 5. Hold this position for __________ seconds. 6. Return both feet to the step. 7. Repeat this exercise with a slight bend in your left / right knee. Repeat __________ times with your left / right knee straight and __________ times with your left / right knee bent. Complete this exercise __________ times a day. Balance exercise This exercise builds your balance and strength control of your arch to help take pressure off your plantar fascia. Single leg stand If this exercise is too easy, you can try it with your eyes closed or while standing on a pillow. 1.   Without shoes, stand near a railing or in a doorway. You may hold on to the railing or door frame as needed. 2. Stand on your left / right foot. Keep your big toe down on the floor and try to keep your arch lifted. Do not let your foot roll inward. 3. Hold this position for __________ seconds. Repeat __________ times. Complete this exercise __________ times a day. This information is not intended to replace advice given to you by your health care provider. Make sure  you discuss any questions you have with your health care provider. Document Revised: 01/03/2019 Document Reviewed: 07/11/2018 Elsevier Patient Education  2020 [employer] Plantar Fasciitis  Plantar fasciitis is a painful foot condition that affects the heel. It occurs when the band of tissue that connects the toes to the heel bone (plantar fascia) becomes irritated. This can happen as the result of exercising too much or doing other repetitive activities (overuse injury). The pain from plantar fasciitis can range from mild irritation to severe pain that makes it difficult to walk or move. The pain is usually worse in the morning after sleeping, or after sitting or lying down for a while. Pain may also be worse after long periods of walking or standing. What are the causes? This condition may be caused by:  Standing for long periods of time.  Wearing shoes that do not have good arch support.  Doing activities that put stress on joints (high-impact activities), including running, aerobics, and ballet.  Being overweight.  An abnormal way of walking (gait).  Tight muscles in the back of your lower leg (calf).  High arches in your feet.  Starting a new athletic activity. What are the signs or symptoms? The main symptom of this condition is heel pain. Pain may:  Be worse with first steps after a time of rest, especially in the morning after sleeping or after you have been sitting or lying down for a while.  Be worse after long periods of standing still.  Decrease after 30-45 minutes of activity, such as gentle walking. How is this diagnosed? This condition may be diagnosed based on your medical history and your symptoms. Your health care provider may ask questions about your activity level. Your health care provider will do a physical exam to check for:  A tender area on the bottom of your foot.  A high arch in your foot.  Pain when you move your foot.  Difficulty moving your  foot. You may have imaging tests to confirm the diagnosis, such as:  X-rays.  Ultrasound.  MRI. How is this treated? Treatment for plantar fasciitis depends on how severe your condition is. Treatment may include:  Rest, ice, applying pressure (compression), and raising the affected foot (elevation). This may be called RICE therapy. Your health care provider may recommend RICE therapy along with over-the-counter pain medicines to manage your pain.  Exercises to stretch your calves and your plantar fascia.  A splint that holds your foot in a stretched, upward position while you sleep (night splint).  Physical therapy to relieve symptoms and prevent problems in the future.  Injections of steroid medicine (cortisone) to relieve pain and inflammation.  Stimulating your plantar fascia with electrical impulses (extracorporeal shock wave therapy). This is usually the last treatment option before surgery.  Surgery, if other treatments have not worked after 12 months. Follow these instructions at home:  Managing pain, stiffness, and swelling  If directed, put ice on the painful area: ? Put ice   in a plastic bag, or use a frozen bottle of water. ? Place a towel between your skin and the bag or bottle. ? Roll the bottom of your foot over the bag or bottle. ? Do this for 20 minutes, 2-3 times a day.  Wear athletic shoes that have air-sole or gel-sole cushions, or try wearing soft shoe inserts that are designed for plantar fasciitis.  Raise (elevate) your foot above the level of your heart while you are sitting or lying down. Activity  Avoid activities that cause pain. Ask your health care provider what activities are safe for you.  Do physical therapy exercises and stretches as told by your health care provider.  Try activities and forms of exercise that are easier on your joints (low-impact). Examples include swimming, water aerobics, and biking. General instructions  Take  over-the-counter and prescription medicines only as told by your health care provider.  Wear a night splint while sleeping, if told by your health care provider. Loosen the splint if your toes tingle, become numb, or turn cold and blue.  Maintain a healthy weight, or work with your health care provider to lose weight as needed.  Keep all follow-up visits as told by your health care provider. This is important. Contact a health care provider if you:  Have symptoms that do not go away after caring for yourself at home.  Have pain that gets worse.  Have pain that affects your ability to move or do your daily activities. Summary  Plantar fasciitis is a painful foot condition that affects the heel. It occurs when the band of tissue that connects the toes to the heel bone (plantar fascia) becomes irritated.  The main symptom of this condition is heel pain that may be worse after exercising too much or standing still for a long time.  Treatment varies, but it usually starts with rest, ice, compression, and elevation (RICE therapy) and over-the-counter medicines to manage pain. This information is not intended to replace advice given to you by your health care provider. Make sure you discuss any questions you have with your health care provider. Document Revised: 08/25/2017 Document Reviewed: 07/10/2017 Elsevier Patient Education  [place]. Voltaren gel 1% apply 2-4 gm up to 3-4 times per day. Heel cord stretching exercises. Go to the [employer] and obtain shoe with adequate arch support and well padded heel.

## 2020-03-31 ENCOUNTER — Telehealth: Payer: Self-pay | Admitting: Pulmonary Disease

## 2020-03-31 NOTE — Telephone Encounter (Signed)
Nucala Order: 100mg  #1 Vial Order Date: 03/31/20 Expected date of arrival: 04/01/20 Ordered by: [place]: [facility]el [facility]

## 2020-04-01 NOTE — Telephone Encounter (Signed)
Nucala Shipment Received: 100mg  #1 vial Medication arrival date: 04/01/20 Lot #: [id number] Exp date: 07/27/2023 Received by: Elliot Dally

## 2020-04-06 ENCOUNTER — Other Ambulatory Visit: Payer: Self-pay

## 2020-04-06 ENCOUNTER — Ambulatory Visit (INDEPENDENT_AMBULATORY_CARE_PROVIDER_SITE_OTHER): Payer: BC Managed Care – PPO

## 2020-04-06 DIAGNOSIS — J4550 Severe persistent asthma, uncomplicated: Secondary | ICD-10-CM

## 2020-04-06 MED ORDER — MEPOLIZUMAB 100 MG ~~LOC~~ SOLR
100.0000 mg | SUBCUTANEOUS | Status: DC
  Administered 2020-04-06: 100 mg via SUBCUTANEOUS

## 2020-04-06 NOTE — Progress Notes (Signed)
All questions were answered by the patient before medication was administered. Have you been hospitalized in the last 10 days? No Do you have a fever? No Do you have a cough? No Do you have a headache or sore throat? No  

## 2020-04-17 DIAGNOSIS — F331 Major depressive disorder, recurrent, moderate: Secondary | ICD-10-CM | POA: Diagnosis not present

## 2020-04-27 ENCOUNTER — Telehealth: Payer: Self-pay | Admitting: Pulmonary Disease

## 2020-04-27 NOTE — Telephone Encounter (Signed)
Nucala Order: 100mg  #1 Vial Order Date: 04/27/2020 Expected date of arrival: 04/28/2020 Ordered by: Desmond Dike, [place]  Specialty Pharmacy: Nigel Mormon

## 2020-04-28 NOTE — Telephone Encounter (Signed)
Nucala Shipment Received: 100mg  #1 vial Medication arrival date: 04/28/2020 Lot #: [id number] Exp date: 06/2023 Received by: Desmond Dike, [place]

## 2020-05-04 ENCOUNTER — Other Ambulatory Visit: Payer: Self-pay | Admitting: *Deleted

## 2020-05-04 ENCOUNTER — Other Ambulatory Visit: Payer: Self-pay

## 2020-05-04 ENCOUNTER — Ambulatory Visit (INDEPENDENT_AMBULATORY_CARE_PROVIDER_SITE_OTHER): Payer: BC Managed Care – PPO

## 2020-05-04 DIAGNOSIS — J4550 Severe persistent asthma, uncomplicated: Secondary | ICD-10-CM

## 2020-05-04 MED ORDER — PROAIR HFA 108 (90 BASE) MCG/ACT IN AERS: 2.0000 | INHALATION_SPRAY | Freq: Four times a day (QID) | RESPIRATORY_TRACT | 5 refills | Status: DC | PRN

## 2020-05-04 MED ORDER — MEPOLIZUMAB 100 MG ~~LOC~~ SOLR
100.0000 mg | SUBCUTANEOUS | Status: DC
  Administered 2020-05-04: 100 mg via SUBCUTANEOUS

## 2020-05-04 NOTE — Progress Notes (Signed)
All questions were answered by the patient before medication was administered. Have you been hospitalized in the last 10 days? No Do you have a fever? No Do you have a cough? No Do you have a headache or sore throat? No  

## 2020-05-06 DIAGNOSIS — F900 Attention-deficit hyperactivity disorder, predominantly inattentive type: Secondary | ICD-10-CM | POA: Diagnosis not present

## 2020-05-06 DIAGNOSIS — F331 Major depressive disorder, recurrent, moderate: Secondary | ICD-10-CM | POA: Diagnosis not present

## 2020-05-12 ENCOUNTER — Telehealth: Payer: Self-pay | Admitting: Pulmonary Disease

## 2020-05-12 NOTE — Telephone Encounter (Signed)
-----   Message from Randa Spike, [place] sent at 05/07/2020 10:08 AM EDT ----- Regarding: FW: follow-up LMTCB x1 for pt.  ----- Message ----- From: Amado Coe, RN Sent: 05/07/2020   9:44 AM EDT To: Randa Spike, CMA Subject: FW: follow-up                                   ----- Message ----- From: Randa Spike, CMA Sent: 05/06/2020  10:42 AM EDT To: Amado Coe, RN Subject: FW: follow-up                                   ----- Message ----- From: Margaretha Seeds, MD Sent: 05/06/2020  10:15 AM EDT To: [facility] Triage Pool Subject: follow-up                                      Please schedule patient for follow-up for severe asthma on biologic  Ardeen Fillers

## 2020-05-12 NOTE — Telephone Encounter (Signed)
Called and left message on pt vm to call back to schedule f/u appt with Dr. Loanne Drilling -pr

## 2020-05-15 NOTE — Telephone Encounter (Signed)
Called and left message on pt vm to call back to schedule f/u - 2nd attempt - closed encounter -pr  

## 2020-05-18 DIAGNOSIS — F900 Attention-deficit hyperactivity disorder, predominantly inattentive type: Secondary | ICD-10-CM | POA: Diagnosis not present

## 2020-05-18 DIAGNOSIS — F331 Major depressive disorder, recurrent, moderate: Secondary | ICD-10-CM | POA: Diagnosis not present

## 2020-05-25 ENCOUNTER — Telehealth: Payer: Self-pay | Admitting: Pulmonary Disease

## 2020-05-25 DIAGNOSIS — F331 Major depressive disorder, recurrent, moderate: Secondary | ICD-10-CM | POA: Diagnosis not present

## 2020-05-25 DIAGNOSIS — F900 Attention-deficit hyperactivity disorder, predominantly inattentive type: Secondary | ICD-10-CM | POA: Diagnosis not present

## 2020-05-25 NOTE — Telephone Encounter (Signed)
Nucala Order: 100mg  #1 Vial Order Date: 05/25/20 Expected date of arrival: 05/26/20 Ordered by: [place]: Nigel Mormon

## 2020-05-27 NOTE — Telephone Encounter (Signed)
Nucala Shipment Received: 100mg  #1 vial Medication arrival date: 05/27/20 Lot #: [id number] Exp date: 10/27/2023 Received by: Elliot Dally

## 2020-06-04 ENCOUNTER — Other Ambulatory Visit: Payer: Self-pay

## 2020-06-04 ENCOUNTER — Ambulatory Visit (INDEPENDENT_AMBULATORY_CARE_PROVIDER_SITE_OTHER): Payer: BC Managed Care – PPO

## 2020-06-04 DIAGNOSIS — J4550 Severe persistent asthma, uncomplicated: Secondary | ICD-10-CM | POA: Diagnosis not present

## 2020-06-04 MED ORDER — MEPOLIZUMAB 100 MG ~~LOC~~ SOLR
100.0000 mg | Freq: Once | SUBCUTANEOUS | Status: AC
  Administered 2020-06-04: 100 mg via SUBCUTANEOUS

## 2020-06-04 NOTE — Progress Notes (Signed)
Have you been hospitalized within the last 10 days?  No Do you have a fever?  No Do you have a cough?  No Do you have a headache or sore throat? No Do you have your Epi Pen visible and is it within date?  Yes 

## 2020-06-08 DIAGNOSIS — F331 Major depressive disorder, recurrent, moderate: Secondary | ICD-10-CM | POA: Diagnosis not present

## 2020-06-08 DIAGNOSIS — F900 Attention-deficit hyperactivity disorder, predominantly inattentive type: Secondary | ICD-10-CM | POA: Diagnosis not present

## 2020-06-15 DIAGNOSIS — F900 Attention-deficit hyperactivity disorder, predominantly inattentive type: Secondary | ICD-10-CM | POA: Diagnosis not present

## 2020-06-15 DIAGNOSIS — F331 Major depressive disorder, recurrent, moderate: Secondary | ICD-10-CM | POA: Diagnosis not present

## 2020-06-22 ENCOUNTER — Telehealth: Payer: Self-pay | Admitting: Pulmonary Disease

## 2020-06-22 NOTE — Telephone Encounter (Signed)
Nucala Order: 100mg  #1 Vial Order Date: 06/22/20 Expected date of arrival: 06/24/20 Ordered by: [place]: Nigel Mormon

## 2020-06-25 NOTE — Telephone Encounter (Signed)
Nucala Shipment Received: 100mg  #1 vial Medication arrival date: 06/25/20 Lot #: [id number] Exp date: 10/27/2023 Received by: Elliot Dally

## 2020-07-02 ENCOUNTER — Ambulatory Visit (INDEPENDENT_AMBULATORY_CARE_PROVIDER_SITE_OTHER): Payer: BC Managed Care – PPO

## 2020-07-02 ENCOUNTER — Other Ambulatory Visit: Payer: Self-pay

## 2020-07-02 DIAGNOSIS — F900 Attention-deficit hyperactivity disorder, predominantly inattentive type: Secondary | ICD-10-CM | POA: Diagnosis not present

## 2020-07-02 DIAGNOSIS — F331 Major depressive disorder, recurrent, moderate: Secondary | ICD-10-CM | POA: Diagnosis not present

## 2020-07-02 DIAGNOSIS — J4550 Severe persistent asthma, uncomplicated: Secondary | ICD-10-CM | POA: Diagnosis not present

## 2020-07-02 MED ORDER — MEPOLIZUMAB 100 MG ~~LOC~~ SOLR
100.0000 mg | Freq: Once | SUBCUTANEOUS | Status: AC
  Administered 2020-07-02: 100 mg via SUBCUTANEOUS

## 2020-07-02 NOTE — Progress Notes (Signed)
Have you been hospitalized within the last 10 days?  No Do you have a fever?  No Do you have a cough?  No Do you have a headache or sore throat? No Do you have your Epi Pen visible and is it within date?  Yes 

## 2020-07-14 DIAGNOSIS — F900 Attention-deficit hyperactivity disorder, predominantly inattentive type: Secondary | ICD-10-CM | POA: Diagnosis not present

## 2020-07-14 DIAGNOSIS — F331 Major depressive disorder, recurrent, moderate: Secondary | ICD-10-CM | POA: Diagnosis not present

## 2020-07-16 DIAGNOSIS — F330 Major depressive disorder, recurrent, mild: Secondary | ICD-10-CM | POA: Diagnosis not present

## 2020-07-16 DIAGNOSIS — F900 Attention-deficit hyperactivity disorder, predominantly inattentive type: Secondary | ICD-10-CM | POA: Diagnosis not present

## 2020-07-20 ENCOUNTER — Telehealth: Payer: Self-pay | Admitting: Pulmonary Disease

## 2020-07-20 NOTE — Telephone Encounter (Signed)
Nucala Order: 100mg  #1 Vial Order Date: 07/20/20 Expected date of arrival: 07/21/20 Ordered by: [place]: Nigel Mormon

## 2020-07-21 NOTE — Telephone Encounter (Signed)
Nucala Shipment Received: 100mg  #1 vial Medication arrival date: 07/21/20 Lot #: [id number] Exp date: 11/24/23 Received by: Elliot Dally

## 2020-07-30 ENCOUNTER — Ambulatory Visit: Payer: BC Managed Care – PPO

## 2020-08-03 DIAGNOSIS — F900 Attention-deficit hyperactivity disorder, predominantly inattentive type: Secondary | ICD-10-CM | POA: Diagnosis not present

## 2020-08-03 DIAGNOSIS — F330 Major depressive disorder, recurrent, mild: Secondary | ICD-10-CM | POA: Diagnosis not present

## 2020-08-24 DIAGNOSIS — M8580 Other specified disorders of bone density and structure, unspecified site: Secondary | ICD-10-CM | POA: Diagnosis not present

## 2020-08-24 DIAGNOSIS — E785 Hyperlipidemia, unspecified: Secondary | ICD-10-CM | POA: Diagnosis not present

## 2020-08-24 DIAGNOSIS — K589 Irritable bowel syndrome without diarrhea: Secondary | ICD-10-CM | POA: Diagnosis not present

## 2020-08-24 DIAGNOSIS — R7301 Impaired fasting glucose: Secondary | ICD-10-CM | POA: Diagnosis not present

## 2020-08-24 DIAGNOSIS — F330 Major depressive disorder, recurrent, mild: Secondary | ICD-10-CM | POA: Diagnosis not present

## 2020-08-24 DIAGNOSIS — F900 Attention-deficit hyperactivity disorder, predominantly inattentive type: Secondary | ICD-10-CM | POA: Diagnosis not present

## 2020-09-02 DIAGNOSIS — F330 Major depressive disorder, recurrent, mild: Secondary | ICD-10-CM | POA: Diagnosis not present

## 2020-09-02 DIAGNOSIS — F900 Attention-deficit hyperactivity disorder, predominantly inattentive type: Secondary | ICD-10-CM | POA: Diagnosis not present

## 2020-09-11 ENCOUNTER — Telehealth: Payer: Self-pay | Admitting: Pulmonary Disease

## 2020-09-11 NOTE — Telephone Encounter (Signed)
Called and spoke with Patient.  Patient's Father passed and is going out of town Tuesday.  Patient scheduled 09/14/20 at 2pm. Nothing further at this time.

## 2020-09-14 ENCOUNTER — Other Ambulatory Visit: Payer: Self-pay

## 2020-09-14 ENCOUNTER — Ambulatory Visit (INDEPENDENT_AMBULATORY_CARE_PROVIDER_SITE_OTHER): Payer: BC Managed Care – PPO

## 2020-09-14 DIAGNOSIS — J4550 Severe persistent asthma, uncomplicated: Secondary | ICD-10-CM | POA: Diagnosis not present

## 2020-09-14 MED ORDER — MEPOLIZUMAB 100 MG ~~LOC~~ SOLR
100.0000 mg | Freq: Once | SUBCUTANEOUS | Status: AC
  Administered 2020-09-14: 100 mg via SUBCUTANEOUS

## 2020-09-14 NOTE — Progress Notes (Signed)
Have you been hospitalized within the last 10 days?  No Do you have a fever?  No Do you have a cough?  No Do you have a headache or sore throat? No Do you have your Epi Pen visible and is it within date?  Yes 

## 2020-09-21 DIAGNOSIS — F330 Major depressive disorder, recurrent, mild: Secondary | ICD-10-CM | POA: Diagnosis not present

## 2020-09-21 DIAGNOSIS — F900 Attention-deficit hyperactivity disorder, predominantly inattentive type: Secondary | ICD-10-CM | POA: Diagnosis not present

## 2020-10-05 ENCOUNTER — Telehealth: Payer: Self-pay | Admitting: Pulmonary Disease

## 2020-10-05 NOTE — Telephone Encounter (Signed)
Nucala Order: 100mg #1 Vial Order Date: 10/05/20 Expected date of arrival: 10/06/20 Ordered by: Sona Nations,LPN Specialty Pharmacy: [facility] 

## 2020-10-06 DIAGNOSIS — F330 Major depressive disorder, recurrent, mild: Secondary | ICD-10-CM | POA: Diagnosis not present

## 2020-10-06 DIAGNOSIS — F900 Attention-deficit hyperactivity disorder, predominantly inattentive type: Secondary | ICD-10-CM | POA: Diagnosis not present

## 2020-10-06 NOTE — Telephone Encounter (Signed)
Nucala Shipment Received: 100mg  #1 vial Medication arrival date: 10/06/20 Lot #: [id number] Exp date: 12/25/2023 Received by: Elliot Dally

## 2020-10-12 ENCOUNTER — Ambulatory Visit: Payer: BC Managed Care – PPO

## 2020-10-14 ENCOUNTER — Telehealth: Payer: Self-pay | Admitting: Pulmonary Disease

## 2020-10-14 ENCOUNTER — Ambulatory Visit: Payer: BC Managed Care – PPO

## 2020-10-14 DIAGNOSIS — F900 Attention-deficit hyperactivity disorder, predominantly inattentive type: Secondary | ICD-10-CM | POA: Diagnosis not present

## 2020-10-14 DIAGNOSIS — F3341 Major depressive disorder, recurrent, in partial remission: Secondary | ICD-10-CM | POA: Diagnosis not present

## 2020-10-14 NOTE — Telephone Encounter (Signed)
ATC x1.  LMTCB. 

## 2020-10-14 NOTE — Telephone Encounter (Signed)
ATC Patient.  LM to call back and schedule Nucala injection.

## 2020-10-15 NOTE — Telephone Encounter (Signed)
Lmtcb for pt.  

## 2020-10-15 NOTE — Telephone Encounter (Signed)
10/15/2020  If patient is afebrile on no antipyretics then I believe it is reasonable for her to have an in office visit to further evaluate her symptoms.  Can also consider chest x-ray at that time.  Would schedule with Dr. Loanne Drilling or an APP.  Wyn Quaker, FNP

## 2020-10-15 NOTE — Telephone Encounter (Signed)
Called and spoke with pt letting her know the info stated by Aaron Edelman and that we should get her in for an appt. Pt verbalized understanding. OV scheduled for pt tomorrow 1/21 with Beth. Nothing further needed.

## 2020-10-15 NOTE — Telephone Encounter (Signed)
Primary Pulmonologist: [place] office visit and with whom: 12/23/19 with Loanne Drilling What do we see them for (pulmonary problems): asthma Last OV assessment/plan:  Assessment & Plan:   55 year old female with severe persistent asthma for follow-up. Currently on monthly Nucala which was initiated on 02/19/19.  Severe Persistent Eosinophilic Asthma, steroid dependent --CONTINUE Nucala. Will re-evaluate at the end of 2021 and pending # of exacerbations, we will consider changing biologics or continuing Nucala. --CONTINUE Symbicort --CONTINUE Albuterol as needed for shortness of breath or wheezing  Asthma Action Plan IF you develop asthma symptoms (chest tightness, cough and wheezing), take an extra 2 puffs of Symbicort as needed every 4 hours. Call us for persistent symptoms and we can order steroids.      Immunization History  Administered Date(s) Administered  . Influenza,inj,Quad PF,6+ Mos 07/01/2016, 07/14/2017, 05/27/2019  . PFIZER SARS-COV-2 Vaccination 12/12/2019  . Tdap 07/14/2014   No orders of the defined types were placed in this encounter.     Meds ordered this encounter  Medications  . Mepolizumab SOLR 100 mg    Return in about 6 months (around 06/24/2020) for with me.  Was appointment offered to patient (explain)?  Checking to see if OV in office will be okay due to symptoms   Reason for call: Called pt and spoke with husband Shanon Brow. Shanon Brow stated pt began to have symptoms 2 days ago including cough, fatigue, and sore throat. Shanon Brow stated after pt has been coughing for awhile, she will sometimes bring up some phlegm but is unsure what the color is.  Pt did take a home covid test which came back negative.  Pt has tried taking nyquil to see if it would help with her symptoms. Shanon Brow stated that pt has had to use rescue inhaler at least 9-10 times daily. Pt is also using her Symbicort inhaler as prescribed. Pt has not used her nebulizer.  Shanon Brow stated that he has  been checking pt's O2 sats and they have been ranging 95-98%.  While I was on the phone with Shanon Brow, I had him check pt's temp and it came back at 98.6. Pt has also taken tylenol recently last about 2 hours ago.  Pt has not been around anyone that they know of that has had covid.  Pt has had all 3 covid vaccines which have all been documented and also has had her flu shot.  Shanon Brow stated that pt is wondering if this could be pna.  Patient and Shanon Brow want to know what could be recommended to help with symptoms.   Aaron Edelman, please advise.  Allergies  Allergen Reactions  . Amoxicillin Anaphylaxis, Hives and Itching  . Black [place] Pollen Allergy Skin Test Anaphylaxis  . Penicillins Anaphylaxis    Has patient had a PCN reaction causing immediate rash, facial/tongue/throat swelling, SOB or lightheadedness with hypotension: Yes Has patient had a PCN reaction causing severe rash involving mucus membranes or skin necrosis: No Has patient had a PCN reaction that required hospitalization Yes Has patient had a PCN reaction occurring within the last 10 years: No If all of the above answers are "NO", then may proceed with Cephalosporin use.   . Sulfa Antibiotics Hives and Other (See Comments)    Hives, mouth blisters   . Chloroquine Other (See Comments)    Renal dysfunction  . Ciprofloxacin Hives  . Phenergan [Promethazine Hcl] Other (See Comments)    Caused "severe drop in blood pressure."  . Sulfamethoxazole Hives  . Other Other (See Comments)  From allergy testing--Canteloupe  . Shellfish Allergy Itching and Rash  . Vancomycin Cross Reactors Other (See Comments)    "RED MAN SYNDROME"  Pt stated she had no reaction to Vancomycin when it is given slow    Immunization History  Administered Date(s) Administered  . Influenza,inj,Quad PF,6+ Mos 07/01/2016, 07/14/2017, 05/27/2019, 05/27/2020  . PFIZER(Purple Top)SARS-COV-2 Vaccination 12/12/2019, 01/06/2020, 07/21/2020  . Tdap 07/14/2014

## 2020-10-15 NOTE — Telephone Encounter (Signed)
LMTCB

## 2020-10-16 ENCOUNTER — Other Ambulatory Visit: Payer: Self-pay

## 2020-10-16 ENCOUNTER — Ambulatory Visit (INDEPENDENT_AMBULATORY_CARE_PROVIDER_SITE_OTHER): Payer: BC Managed Care – PPO

## 2020-10-16 ENCOUNTER — Encounter: Payer: Self-pay | Admitting: Primary Care

## 2020-10-16 ENCOUNTER — Ambulatory Visit: Payer: BC Managed Care – PPO | Admitting: Primary Care

## 2020-10-16 VITALS — BP 120/82 | HR 97 | Temp 97.6°F | Ht 69.0 in | Wt 224.2 lb

## 2020-10-16 DIAGNOSIS — J45909 Unspecified asthma, uncomplicated: Secondary | ICD-10-CM | POA: Diagnosis not present

## 2020-10-16 DIAGNOSIS — R059 Cough, unspecified: Secondary | ICD-10-CM | POA: Diagnosis not present

## 2020-10-16 DIAGNOSIS — J209 Acute bronchitis, unspecified: Secondary | ICD-10-CM

## 2020-10-16 DIAGNOSIS — R0602 Shortness of breath: Secondary | ICD-10-CM

## 2020-10-16 MED ORDER — DOXYCYCLINE HYCLATE 100 MG PO TABS: 100.0000 mg | ORAL_TABLET | Freq: Two times a day (BID) | ORAL | 0 refills | Status: DC

## 2020-10-16 MED ORDER — HYDROCODONE-HOMATROPINE 5-1.5 MG/5ML PO SYRP: 5.0000 mL | ORAL_SOLUTION | Freq: Four times a day (QID) | ORAL | 0 refills | Status: DC | PRN

## 2020-10-16 MED ORDER — HYDROCOD POLST-CPM POLST ER 10-8 MG/5ML PO SUER: 5.0000 mL | Freq: Two times a day (BID) | ORAL | 0 refills | Status: DC

## 2020-10-16 MED ORDER — PREDNISONE 10 MG PO TABS: ORAL_TABLET | ORAL | 0 refills | Status: DC

## 2020-10-16 MED ORDER — METHYLPREDNISOLONE ACETATE 80 MG/ML IJ SUSP
80.0000 mg | Freq: Once | INTRAMUSCULAR | Status: AC
Start: 2020-10-16 — End: 2020-10-16
  Administered 2020-10-16: 80 mg via INTRAMUSCULAR

## 2020-10-16 NOTE — Progress Notes (Signed)
@Patient  ID: [name] [name], female    DOB: [birth year], 55 y.o.   MRN: [id number]  Chief Complaint  Patient presents with  . Acute Visit    Coughing up green mucous, congestion for almost a week, vomiting from coughing    Referring provider: Prince Solian, MD  HPI: 55 year old female, never smoked.  Past medical history significant for severe persistent asthma.  Patient of Dr. Loanne Drilling in 12/23/2019.  Patient is maintained on monthly Nucala injections, Symbicort 160, Singulair and as needed albuterol.  10/16/2020 Presents today for an acute visit.  Patient developed nasal congestion, cough, sore throat and diarrhea 5-7 days ago. Cough is a persistent dry which is occasionally productive with green sputum. She has been coughing so much which can cause her to vomit. She has been using delsym, dayquil/nyquil and occasionally taken mucinex. Imodium has not helped diarrhea. She can not use nasal sprays. She has been using her rescue inhaler upwards of 10 times a day. She is compliant with Symbicort.  She is about a week overdue for Nucala injection. Oxygen level at home has been running between 95 and 98% on room air. 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 Her father recently passed and her husband has parkinson's. She has had no known COVID contacts.  She has received all 3 COVID vaccines and influenza shot.  She took two at home COVID test which came back negative. Patient has remained afebrile.   Allergies  Allergen Reactions  . Amoxicillin Anaphylaxis, Hives and Itching  . Black Walnut Pollen Allergy Skin Test Anaphylaxis  . Penicillins Anaphylaxis    Has patient had a PCN reaction causing immediate rash, facial/tongue/throat swelling, SOB or lightheadedness with hypotension: Yes Has patient had a PCN reaction causing severe rash involving mucus membranes or skin necrosis: No Has patient had a PCN reaction that required hospitalization  Yes Has patient had a PCN reaction occurring within the last 10 years: No If all of the above answers are "NO", then may proceed with Cephalosporin use.   . Sulfa Antibiotics Hives and Other (See Comments)    Hives, mouth blisters   . Chloroquine Other (See Comments)    Renal dysfunction  . Ciprofloxacin Hives  . Phenergan [Promethazine Hcl] Other (See Comments)    Caused "severe drop in blood pressure."  . Sulfamethoxazole Hives  . Other Other (See Comments)    From allergy testing--Canteloupe  . Shellfish Allergy Itching and Rash  . Vancomycin Cross Reactors Other (See Comments)    "RED MAN SYNDROME"  Pt stated she had no reaction to Vancomycin when it is given slow    Immunization History  Administered Date(s) Administered  . Influenza,inj,Quad PF,6+ Mos 07/01/2016, 07/14/2017, 05/27/2019, 05/27/2020  . PFIZER(Purple Top)SARS-COV-2 Vaccination 12/12/2019, 01/06/2020, 07/21/2020  . Tdap 07/14/2014    Past Medical History:  Diagnosis Date  . 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 Arthritis    hnp-lumbar   . Blood dyscrasia    hx probable blood clot after ankle surgery  not definitive but treated per pt  . Cancer ([place])    "early evolvng melanoma "- legs   . Depression   . Diabetes insipidus ([place])   . Family history of adverse reaction to anesthesia    mom has N/V - zofran works  . Heart murmur    years ago-echo 05 no problems  .  High cholesterol   . History of blood transfusion    "16 so far" (07/25/2012)  . History of bronchitis   . History of UTI   . Hypertension    no med for 1 yr  . 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 Migraines   . Peripheral vascular disease ([place])    ? dvt 2011 tx as preventive although did not show on scan after ankle surgery  . Pneumonia ~ 2009; 2010  . PONV (postoperative nausea and vomiting)    last 2 surgeries less nausea  . PONV (postoperative nausea and vomiting)     none with last surgery-be extra careful with nose patient haas septal hole     Tobacco History: Social History   Tobacco Use  Smoking Status Never Smoker  Smokeless Tobacco Never Used   Counseling given: Not Answered   Outpatient Medications Prior to Visit  Medication Sig Dispense Refill  . ADDERALL XR 20 MG 24 hr capsule Take 20 mg by mouth daily as needed (concentration).   0  . azithromycin (ZITHROMAX) 250 MG tablet Zpack taper as directed for asthma exacerbation with bronchitis 6 tablet 0  . budesonide-formoterol (SYMBICORT) 160-4.5 MCG/ACT inhaler Inhale 2 puffs into the lungs 2 (two) times daily. 1 Inhaler 6  . desmopressin (DDAVP) 0.1 MG tablet Take 0.1 mg by mouth 2 (two) times daily.    . diclofenac Sodium (VOLTAREN) 1 % GEL Apply 4 g topically 4 (four) times daily. 350 g 3  . EPINEPHrine 0.3 mg/0.3 mL IJ SOAJ injection Inject 0.3 mLs (0.3 mg total) into the muscle as needed for anaphylaxis. 0.3 mL 11  . estradiol (CLIMARA - DOSED IN MG/24 HR) 0.0375 mg/24hr patch Place 0.0375 mg onto the skin once a week. saturdays    . ipratropium (ATROVENT) 0.02 % nebulizer solution Take 2.5 mLs (0.5 mg total) by nebulization every 6 (six) hours as needed for wheezing or shortness of breath. icd- J42 75 mL 12  . montelukast (SINGULAIR) 10 MG tablet TAKE 1 TABLET BY MOUTH EVERYDAY AT BEDTIME 90 tablet 3  . OVER THE COUNTER MEDICATION Take 6 tablets by mouth daily. Smarty Pants Vitamins    . PROAIR HFA 108 (90 Base) MCG/ACT inhaler Inhale 2 puffs into the lungs every 6 (six) hours as needed for wheezing or shortness of breath. 8 g 5  . Probiotic CAPS Take 1 capsule by mouth daily.     Marland Kitchen Respiratory Therapy Supplies (FLUTTER) DEVI Use flutter valve 3 times a day 1 each 0  . rosuvastatin (CRESTOR) 20 MG tablet Take 20 mg by mouth daily.    Marland Kitchen topiramate (TOPAMAX) 50 MG tablet Take 150 mg by mouth daily.    . valACYclovir (VALTREX) 1000 MG tablet Take two tablets as a single dose prn cold sore.  (Patient taking differently: Take 2,000 mg by mouth daily as needed. Take two tablets as a single dose prn cold sore.) 20 tablet 1   Facility-Administered Medications Prior to Visit  Medication Dose Route Frequency Provider Last Rate Last Admin  . Mepolizumab SOLR 100 mg  100 mg Subcutaneous Q28 days Margaretha Seeds, MD   100 mg at 04/16/19 1022  . Mepolizumab SOLR 100 mg  100 mg Subcutaneous Q28 days Margaretha Seeds, MD   100 mg at 05/13/19 7628  . Mepolizumab SOLR 100 mg  100 mg Subcutaneous Q28 days Margaretha Seeds, MD   100 mg at 06/10/19 3151  . Mepolizumab SOLR 100 mg  100 mg Subcutaneous  Q28 days Luciano CutterEllison, Chi Jane, MD   100 mg at 08/05/19 1001  . Mepolizumab SOLR 100 mg  100 mg Subcutaneous Q28 days Luciano CutterEllison, Chi Jane, MD   100 mg at 09/02/19 0954  . Mepolizumab SOLR 100 mg  100 mg Subcutaneous Q28 days Luciano CutterEllison, Chi Jane, MD   100 mg at 09/30/19 1141  . Mepolizumab SOLR 100 mg  100 mg Subcutaneous Q28 days Luciano CutterEllison, Chi Jane, MD   100 mg at 11/25/19 1215  . Mepolizumab SOLR 100 mg  100 mg Subcutaneous Q28 days Luciano CutterEllison, Chi Jane, MD   100 mg at 12/23/19 1137  . Mepolizumab SOLR 100 mg  100 mg Subcutaneous Q28 days Luciano CutterEllison, Chi Jane, MD   100 mg at 03/02/20 1001  . Mepolizumab SOLR 100 mg  100 mg Subcutaneous Q28 days Luciano CutterEllison, Chi Jane, MD   100 mg at 04/06/20 0903  . Mepolizumab SOLR 100 mg  100 mg Subcutaneous Q28 days Luciano CutterEllison, Chi Jane, MD   100 mg at 05/04/20 [id number]   Review of Systems  Review of Systems  Constitutional: Negative for fever.  HENT: Positive for congestion and postnasal drip.   Respiratory: Positive for cough.   Cardiovascular: Positive for leg swelling.  Gastrointestinal: Positive for diarrhea.   Physical Exam  BP 120/82 (BP Location: Left Arm, Cuff Size: Normal)   Pulse 97   Temp 97.6 F (36.4 C) (Oral)   Ht 5\' 9"  (1.753 m)   Wt 224 lb 3.2 oz (101.7 kg)   SpO2 97%   BMI 33.11 kg/m  Physical Exam Constitutional:      General: She is not in acute  distress.    Appearance: Normal appearance. She is diaphoretic. She is not toxic-appearing.  HENT:     Head: Normocephalic and atraumatic.     Right Ear: Tympanic membrane normal. There is no impacted cerumen.     Left Ear: Tympanic membrane normal. There is no impacted cerumen.     Mouth/Throat:     Mouth: Mucous membranes are moist.     Pharynx: Oropharynx is clear.  Cardiovascular:     Rate and Rhythm: Normal rate and regular rhythm.     Comments: Trace BLE edema, non-pitting Pulmonary:     Effort: Pulmonary effort is normal.     Breath sounds: No wheezing, rhonchi or rales.     Comments: Reactive cough, fairly persistent  Musculoskeletal:        General: Normal range of motion.  Skin:    General: Skin is warm.  Neurological:     General: No focal deficit present.     Mental Status: She is alert and oriented to person, place, and time. Mental status is at baseline.  Psychiatric:        Mood and Affect: Mood normal.        Behavior: Behavior normal.        Thought Content: Thought content normal.        Judgment: Judgment normal.      Lab Results:  CBC    Component Value Date/Time   WBC 7.4 09/25/2018 1156   RBC 4.45 09/25/2018 1156   HGB 14.5 09/25/2018 1156   HCT 42.0 09/25/2018 1156   PLT 310 09/25/2018 1156   MCV 94.4 09/25/2018 1156   MCH 32.6 09/25/2018 1156   MCHC 34.5 09/25/2018 1156   RDW 12.0 09/25/2018 1156   LYMPHSABS 3,352 09/25/2018 1156   MONOABS 0.8 12/07/2016 0644   EOSABS 252 09/25/2018 1156   BASOSABS 22  09/25/2018 1156    BMET    Component Value Date/Time   NA 140 09/25/2018 1156   K 4.0 09/25/2018 1156   CL 105 09/25/2018 1156   CO2 26 09/25/2018 1156   GLUCOSE 89 09/25/2018 1156   BUN 14 09/25/2018 1156   CREATININE 0.84 09/25/2018 1156   CALCIUM 9.3 09/25/2018 1156   GFRNONAA >60 12/08/2016 0608   GFRAA >60 12/08/2016 0608    BNP    Component Value Date/Time   BNP 24 09/25/2018 1156    ProBNP No results found for:  PROBNP  Imaging: DG Chest 2 View  Result Date: 10/16/2020 CLINICAL DATA:  Cough. EXAM: CHEST - 2 VIEW COMPARISON:  September 16, 2019. FINDINGS: The heart size and mediastinal contours are within normal limits. Both lungs are clear. The visualized skeletal structures are unremarkable. IMPRESSION: No active cardiopulmonary disease. Electronically Signed   By: Marijo Conception M.D.   On: 10/16/2020 10:42     Assessment & Plan:   Bronchitis with asthma, acute Patient has hx severe persistent asthma on monthly Nucala injections. She has had a persistent cough with occasional purulent sputum over the last week. Lungs were clear on exam but she had a moderate reactive cough when speaking or taking a deep breath. She received depo-medrol 80mg  x 1 today in office for acute exacerbation of her asthma symptoms. We will be checking CXR to r/o pna. She had no improvement with over the counter medications. RX for prednisone taper and hycodan 32ml q 6 hours for cough suppression was provided. PMP reviewed. Depending on imaging results may need abx coverage. Home covid testing has been negative and she is fully vaccinated.    Martyn Ehrich, NP 10/16/2020

## 2020-10-16 NOTE — Patient Instructions (Addendum)
Symptoms are consistent with sinobronchits. We will check CXR to rule out pneumonia.   Recommendations: - Continue Symbicort two puff twice daily - Start prednisone taper tomorrow because you received a steriod injection in office today  - Reschedule Nucala injection when feeling better  - Use caution while taking prescription cough medication (do not combine with other prescription medication, alcohol or drive while using) - Once CXR is results we will discuss antibiotic - Stick to [employer] over the next 2-3 days  - Push fluids, drink at least 8-10 eight oz glass of fluid a day - Let us know how you are doing and if not better    Orders: - CXR today  In office treatment: - Depo-medrol 80mg  injection   Follow-up - Please schedule follow-up with Dr. Loanne Drilling first available (over due from September)   Acute Bronchitis, Adult  Acute bronchitis is when air tubes in the lungs (bronchi) suddenly get swollen. The condition can make it hard for you to breathe. In adults, acute bronchitis usually goes away within 2 weeks. A cough caused by bronchitis may last up to 3 weeks. Smoking, allergies, and asthma can make the condition worse. What are the causes? This condition is caused by:  Cold and flu viruses. The most common cause of this condition is the virus that causes the common cold.  Bacteria.  Substances that irritate the lungs, including: ? Smoke from cigarettes and other types of tobacco. ? Dust and pollen. ? Fumes from chemicals, gases, or burned fuel. ? Other materials that pollute indoor or outdoor air.  Close contact with someone who has acute bronchitis. What increases the risk? The following factors may make you more likely to develop this condition:  A weak body's defense system. This is also called the immune system.  Any condition that affects your lungs and breathing, such as asthma. What are the signs or symptoms? Symptoms of this condition include:  A  cough.  Coughing up clear, yellow, or green mucus.  Wheezing.  Chest congestion.  Shortness of breath.  A fever.  Body aches.  Chills.  A sore throat. How is this treated? Acute bronchitis may go away over time without treatment. Your doctor may recommend:  Drinking more fluids.  Taking a medicine for a fever or cough.  Using a device that gets medicine into your lungs (inhaler).  Using a vaporizer or a humidifier. These are machines that add water or moisture in the air to help with coughing and poor breathing. Follow these instructions at home: Activity  Get a lot of rest.  Avoid places where there are fumes from chemicals.  Return to your normal activities as told by your doctor. Ask your doctor what activities are safe for you. Lifestyle  Drink enough fluids to keep your pee (urine) pale yellow.  Do not drink alcohol.  Do not use any products that contain nicotine or tobacco, such as cigarettes, e-cigarettes, and chewing tobacco. If you need help quitting, ask your doctor. Be aware that: ? Your bronchitis will get worse if you smoke or breathe in other people's smoke (secondhand smoke). ? Your lungs will heal faster if you quit smoking. General instructions  Take over-the-counter and prescription medicines only as told by your doctor.  Use an inhaler, cool mist vaporizer, or humidifier as told by your doctor.  Rinse your mouth often with salt water. To make salt water, dissolve -1 tsp (3-6 g) of salt in 1 cup (237 mL) of warm water.  Keep all follow-up visits as told by your doctor. This is important.   How is this prevented? To lower your risk of getting this condition again:  Wash your hands often with soap and water. If soap and water are not available, use hand sanitizer.  Avoid contact with people who have cold symptoms.  Try not to touch your mouth, nose, or eyes with your hands.  Make sure to get the flu shot every year.   Contact a doctor  if:  Your symptoms do not get better in 2 weeks.  You vomit more than once or twice.  You have symptoms of loss of fluid from your body (dehydration). These include: ? Dark urine. ? Dry skin or eyes. ? Increased thirst. ? Headaches. ? Confusion. ? Muscle cramps. Get help right away if:  You cough up blood.  You have chest pain.  You have very bad shortness of breath.  You become dehydrated.  You faint or keep feeling like you are going to faint.  You keep vomiting.  You have a very bad headache.  Your fever or chills get worse. These symptoms may be an emergency. Do not wait to see if the symptoms will go away. Get medical help right away. Call your local emergency services (911 in the [place] Do not drive yourself to the hospital. Summary  Acute bronchitis is when air tubes in the lungs (bronchi) suddenly get swollen. In adults, acute bronchitis usually goes away within 2 weeks.  Take over-the-counter and prescription medicines only as told by your doctor.  Drink enough fluid to keep your pee (urine) pale yellow.  Contact a doctor if your symptoms do not improve after 2 weeks of treatment.  Get help right away if you cough up blood, faint, or have chest pain or shortness of breath. This information is not intended to replace advice given to you by your health care provider. Make sure you discuss any questions you have with your health care provider. Document Revised: 04/05/2019 Document Reviewed: 04/05/2019 Elsevier Patient Education  2021 [employer].   Diarrhea, Adult Diarrhea is when you pass loose and watery poop (stool) often. Diarrhea can make you feel weak and cause you to lose water in your body (get dehydrated). Losing water in your body can cause you to:  Feel tired and thirsty.  Have a dry mouth.  Go pee (urinate) less often. Diarrhea often lasts 2-3 days. However, it can last longer if it is a sign of something more serious. It is important to  treat your diarrhea as told by your doctor. Follow these instructions at home: Eating and drinking Follow these instructions as told by your doctor:  Take an ORS (oral rehydration solution). This is a drink that helps you replace fluids and minerals your body lost. It is sold at pharmacies and stores.  Drink plenty of fluids, such as: ? Water. ? Ice chips. ? Diluted fruit juice. ? Low-calorie sports drinks. ? Milk, if you want.  Avoid drinking fluids that have a lot of sugar or caffeine in them.  Eat bland, easy-to-digest foods in small amounts as you are able. These foods include: ? Bananas. ? Applesauce. ? Rice. ? Low-fat (lean) meats. ? Toast. ? Crackers.  Avoid alcohol.  Avoid spicy or fatty foods.      Medicines  Take over-the-counter and prescription medicines only as told by your doctor.  If you were prescribed an antibiotic medicine, take it as told by your doctor. Do not stop  using the antibiotic even if you start to feel better. General instructions  Wash your hands often using soap and water. If soap and water are not available, use a hand sanitizer. Others in your home should wash their hands as well. Hands should be washed: ? After using the toilet or changing a diaper. ? Before preparing, cooking, or serving food. ? While caring for a sick person. ? While visiting someone in a hospital.  Drink enough fluid to keep your pee (urine) pale yellow.  Rest at home while you get better.  Watch your condition for any changes.  Take a warm bath to help with any burning or pain from having diarrhea.  Keep all follow-up visits as told by your doctor. This is important.   Contact a doctor if:  You have a fever.  Your diarrhea gets worse.  You have new symptoms.  You cannot keep fluids down.  You feel light-headed or dizzy.  You have a headache.  You have muscle cramps. Get help right away if:  You have chest pain.  You feel very weak or you pass  out (faint).  You have bloody or black poop or poop that looks like tar.  You have very bad pain, cramping, or bloating in your belly (abdomen).  You have trouble breathing or you are breathing very quickly.  Your heart is beating very quickly.  Your skin feels cold and clammy.  You feel confused.  You have signs of losing too much water in your body, such as: ? Dark pee, very little pee, or no pee. ? Cracked lips. ? Dry mouth. ? Sunken eyes. ? Sleepiness. ? Weakness. Summary  Diarrhea is when you pass loose and watery poop (stool) often.  Diarrhea can make you feel weak and cause you to lose water in your body (get dehydrated).  Take an ORS (oral rehydration solution). This is a drink that is sold at pharmacies and stores.  Eat bland, easy-to-digest foods in small amounts as you are able.  Contact a doctor if your condition gets worse. Get help right away if you have signs that you have lost too much water in your body. This information is not intended to replace advice given to you by your health care provider. Make sure you discuss any questions you have with your health care provider. Document Revised: 02/16/2018 Document Reviewed: 02/16/2018 Elsevier Patient Education  2021 [employer].

## 2020-10-16 NOTE — Assessment & Plan Note (Addendum)
Patient has hx severe persistent asthma on monthly Nucala injections. She has had a persistent cough with occasional purulent sputum over the last week. Lungs were clear on exam but she had a moderate reactive cough when speaking or taking a deep breath. She received depo-medrol 80mg  x 1 today in office for acute exacerbation of asthmatic bronchitis symptoms. CXR showed no acute process. She had no improvement with over the counter medications. RX for Doxycycline 100mg  BID, Prednisone taper and Tussionex BID. PMP reviewed. Depending on imaging results may need abx coverage. Home covid testing has been negative and she is fully vaccinated.

## 2020-10-19 ENCOUNTER — Other Ambulatory Visit: Payer: Self-pay

## 2020-10-19 ENCOUNTER — Encounter: Payer: Self-pay | Admitting: Pulmonary Disease

## 2020-10-19 ENCOUNTER — Ambulatory Visit (INDEPENDENT_AMBULATORY_CARE_PROVIDER_SITE_OTHER): Payer: BC Managed Care – PPO | Admitting: Pulmonary Disease

## 2020-10-19 VITALS — BP 122/76 | HR 99 | Temp 98.0°F | Ht 69.0 in | Wt 226.4 lb

## 2020-10-19 DIAGNOSIS — J4550 Severe persistent asthma, uncomplicated: Secondary | ICD-10-CM | POA: Diagnosis not present

## 2020-10-19 NOTE — Patient Instructions (Signed)
Severe Persistent Eosinophilic Asthma, steroid dependent --Complete steroids from last visit --CONTINUE Nucala. Will re-evaluate at next visit # of exacerbations andwe will consider changing biologics or continuing Nucala. --CONTINUE Symbicort --CONTINUE Albuterol as needed for shortness of breath or wheezing  Asthma Action Plan IF you develop asthma symptoms (chest tightness, cough and wheezing), take an extra 2 puffs of Symbicort as needed every 4 hours. Call us for persistent symptoms and we can order steroids.  Follow-up in 2 months

## 2020-10-19 NOTE — Progress Notes (Signed)
Synopsis:  Referred in 06/2018 for chronic productive cough and shortness of breath. Methacholine challenge 07/2018 consistent with asthma  Subjective:   PATIENT ID: [name] [name] GENDER: female DOB: 1966/07/16, MRN: [id number]   HPI  Chief Complaint  Patient presents with  . Follow-up    Feeling better from her visit on Friday with Beth.     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  Synopsis: Started on [place] on 03/16/2019.  Overall well-controlled since initiation of biologic.  She previously had 3 exacerbations a year.  Now seems to only occur at most once a year.  At baseline she has a minimal productive cough asthma triggered by allergies.  Interval She presents for follow-up for asthma exacerbation treated with Depo-Medrol 80 mg in office in prednisone taper on 10/16/2020.  Home Covid tests were negative x2.  Since starting the steroids her symptoms have improved including productive cough and nasal congestion.  She is requiring her albuterol inhaler less frequently.  She has been compliant with her bronchodilators  2019 Jan Feb March April May June July Aug Sept Oct Nov Dec            X X X  2020 Jan Feb March April May June July Aug Sept Oct Nov Dec   X    X       X   2021 Jan Feb March April May June July Aug Sept Oct Nov Dec                2022 Jan Feb March April May June July Aug Sept Oct Nov Dec   X               Social History: Non-smoker She has had recent stressors.  Her husband has been diagnosed with Parkinson.  Her father passed this winter in 2021.  I have personally reviewed patient's past medical/family/social history/allergies/current medications. Past Medical History:  Diagnosis Date  . Adrenal gland hypofunction (HCC)    related to post op, vancomycin & lumbar injection,  steroids    . Arthritis    hnp-lumbar   . Blood dyscrasia    hx probable blood clot after ankle surgery  not definitive but treated per pt  . Cancer ([place])    "early evolvng melanoma "- legs   . Depression   . Diabetes insipidus ([place])   . Family history of adverse reaction to anesthesia    mom has N/V - zofran works  . Heart murmur    years ago-echo 05 no problems  . High cholesterol   . History of blood transfusion    "16 so far" (07/25/2012)  . History of bronchitis   . History of UTI   . Hypertension    no med for 1 yr  . 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 Migraines   . Peripheral vascular disease ([place])    ? dvt 2011 tx as preventive although did not show on scan after ankle surgery  . Pneumonia ~ 2009; 2010  . PONV (postoperative nausea and vomiting)    last 2 surgeries less nausea  . PONV (postoperative nausea and vomiting)    none with last surgery-be extra careful with nose patient haas septal hole      Allergies  Allergen Reactions  . Amoxicillin Anaphylaxis, Hives and Itching  . Black Walnut Pollen Allergy Skin  Test Anaphylaxis  . Penicillins Anaphylaxis    Has patient had a PCN reaction causing immediate rash, facial/tongue/throat swelling, SOB or lightheadedness with hypotension: Yes Has patient had a PCN reaction causing severe rash involving mucus membranes or skin necrosis: No Has patient had a PCN reaction that required hospitalization Yes Has patient had a PCN reaction occurring within the last 10 years: No If all of the above answers are "NO", then may proceed with Cephalosporin use.   . Sulfa Antibiotics Hives and Other (See Comments)    Hives, mouth blisters   . Chloroquine Other (See Comments)    Renal dysfunction  . Ciprofloxacin Hives  . Phenergan [Promethazine Hcl] Other (See Comments)    Caused "severe drop in blood pressure."  . Sulfamethoxazole Hives  . Other Other (See Comments)    From allergy testing--Canteloupe  . Shellfish Allergy  Itching and Rash  . Vancomycin Cross Reactors Other (See Comments)    "RED MAN SYNDROME"  Pt stated she had no reaction to Vancomycin when it is given slow     Outpatient Medications Prior to Visit  Medication Sig Dispense Refill  . ADDERALL XR 20 MG 24 hr capsule Take 20 mg by mouth daily as needed (concentration).   0  . azithromycin (ZITHROMAX) 250 MG tablet Zpack taper as directed for asthma exacerbation with bronchitis 6 tablet 0  . budesonide-formoterol (SYMBICORT) 160-4.5 MCG/ACT inhaler Inhale 2 puffs into the lungs 2 (two) times daily. 1 Inhaler 6  . chlorpheniramine-HYDROcodone (TUSSIONEX PENNKINETIC ER) 10-8 MG/5ML SUER Take 5 mLs by mouth 2 (two) times daily. 115 mL 0  . desmopressin (DDAVP) 0.1 MG tablet Take 0.1 mg by mouth 2 (two) times daily.    . diclofenac Sodium (VOLTAREN) 1 % GEL Apply 4 g topically 4 (four) times daily. 350 g 3  . doxycycline (VIBRA-TABS) 100 MG tablet Take 1 tablet (100 mg total) by mouth 2 (two) times daily. 14 tablet 0  . EPINEPHrine 0.3 mg/0.3 mL IJ SOAJ injection Inject 0.3 mLs (0.3 mg total) into the muscle as needed for anaphylaxis. 0.3 mL 11  . estradiol (CLIMARA - DOSED IN MG/24 HR) 0.0375 mg/24hr patch Place 0.0375 mg onto the skin once a week. saturdays    . ipratropium (ATROVENT) 0.02 % nebulizer solution Take 2.5 mLs (0.5 mg total) by nebulization every 6 (six) hours as needed for wheezing or shortness of breath. icd- J42 75 mL 12  . montelukast (SINGULAIR) 10 MG tablet TAKE 1 TABLET BY MOUTH EVERYDAY AT BEDTIME 90 tablet 3  . OVER THE COUNTER MEDICATION Take 6 tablets by mouth daily. Smarty Pants Vitamins    . predniSONE (DELTASONE) 10 MG tablet Take 4 tabs po daily x 2 days; then 3 tabs for 2 days; then 2 tabs for 2 days; then 1 tab for 2 days 20 tablet 0  . PROAIR HFA 108 (90 Base) MCG/ACT inhaler Inhale 2 puffs into the lungs every 6 (six) hours as needed for wheezing or shortness of breath. 8 g 5  . Probiotic CAPS Take 1 capsule by mouth  daily.     Marland Kitchen Respiratory Therapy Supplies (FLUTTER) DEVI Use flutter valve 3 times a day 1 each 0  . rosuvastatin (CRESTOR) 20 MG tablet Take 20 mg by mouth daily.    Marland Kitchen topiramate (TOPAMAX) 50 MG tablet Take 150 mg by mouth daily.    . valACYclovir (VALTREX) 1000 MG tablet Take two tablets as a single dose prn cold sore. (Patient taking differently: Take 2,000  mg by mouth daily as needed. Take two tablets as a single dose prn cold sore.) 20 tablet 1   Facility-Administered Medications Prior to Visit  Medication Dose Route Frequency Provider Last Rate Last Admin  . Mepolizumab SOLR 100 mg  100 mg Subcutaneous Q28 days Margaretha Seeds, MD   100 mg at 04/16/19 1022  . Mepolizumab SOLR 100 mg  100 mg Subcutaneous Q28 days Margaretha Seeds, MD   100 mg at 05/13/19 8101  . Mepolizumab SOLR 100 mg  100 mg Subcutaneous Q28 days Margaretha Seeds, MD   100 mg at 06/10/19 7510  . Mepolizumab SOLR 100 mg  100 mg Subcutaneous Q28 days Margaretha Seeds, MD   100 mg at 08/05/19 1001  . Mepolizumab SOLR 100 mg  100 mg Subcutaneous Q28 days Margaretha Seeds, MD   100 mg at 09/02/19 0954  . Mepolizumab SOLR 100 mg  100 mg Subcutaneous Q28 days Margaretha Seeds, MD   100 mg at 09/30/19 1141  . Mepolizumab SOLR 100 mg  100 mg Subcutaneous Q28 days Margaretha Seeds, MD   100 mg at 11/25/19 1215  . Mepolizumab SOLR 100 mg  100 mg Subcutaneous Q28 days Margaretha Seeds, MD   100 mg at 12/23/19 1137  . Mepolizumab SOLR 100 mg  100 mg Subcutaneous Q28 days Margaretha Seeds, MD   100 mg at 03/02/20 1001  . Mepolizumab SOLR 100 mg  100 mg Subcutaneous Q28 days Margaretha Seeds, MD   100 mg at 04/06/20 0903  . Mepolizumab SOLR 100 mg  100 mg Subcutaneous Q28 days Margaretha Seeds, MD   100 mg at 05/04/20 2585    Review of Systems  Constitutional: Negative for chills, diaphoresis, fever, malaise/fatigue and weight loss.  HENT: Negative for congestion.   Respiratory: Positive for cough. Negative for  hemoptysis, sputum production, shortness of breath and wheezing.   Cardiovascular: Negative for chest pain, palpitations and leg swelling.     Objective:   Vitals:   10/19/20 1334  BP: 122/76  Pulse: 99  Temp: 98 F (36.7 C)  TempSrc: Tympanic  SpO2: 98%  Weight: 226 lb 6 oz (102.7 kg)  Height: 5\' 9"  (1.753 m)   SpO2: 98 %  Physical Exam: General: Well-appearing, no acute distress HENT: [place], AT Eyes: EOMI, no scleral icterus Respiratory: Clear to auscultation bilaterally.  No crackles, wheezing or rales Cardiovascular: RRR, -M/R/G, no JVD Extremities:-Edema,-tenderness Neuro: AAO x4, CNII-XII grossly intact  Labs Eosinophil 252 (3.4%) 09/25/18  Chest imaging: CXR 09/25/2018-normal chest x-ray with clear lung fields without evidence of infiltrate, effusion or edema CXR 10/18/2019-no infiltrate or effusion or edema.  PFT:  08/07/2018-Mild bronchial hyper reactivity suggestive of asthma  Imaging, labs and tests noted above have been reviewed independently by me.    Assessment & Plan:   55 year old female with severe persistent asthma who presents for follow-up.  Overall well controlled on Nucala which was initiated on 02/19/2019.  Her last asthma exacerbation was on 09/2020.  Previously before that she did not have any exacerbations per year.  We have previously discussed changing biologics but when comparing her past history of exacerbations, I believe Nucala is doing well for her.  Will address at next visit  Severe Persistent Eosinophilic Asthma - no longer steroid dependent Asthma exacerbation - improving --Complete steroids from last visit --CONTINUE Nucala. Will re-evaluate at next visit # of exacerbations and we will consider changing biologics or continuing Nucala. --CONTINUE Symbicort --CONTINUE  Albuterol as needed for shortness of breath or wheezing  Asthma Action Plan IF you develop asthma symptoms (chest tightness, cough and wheezing), take an extra 2 puffs  of Symbicort as needed every 4 hours. Call us for persistent symptoms and we can order steroids.  Immunization History  Administered Date(s) Administered  . Influenza,inj,Quad PF,6+ Mos 07/01/2016, 07/14/2017, 05/27/2019, 05/27/2020  . PFIZER(Purple Top)SARS-COV-2 Vaccination 12/12/2019, 01/06/2020, 07/21/2020  . Tdap 07/14/2014   No orders of the defined types were placed in this encounter.  No orders of the defined types were placed in this encounter.   Return in about 2 months (around 12/17/2020).  I have spent a total time of 31-minutes on the day of the appointment reviewing prior documentation, coordinating care and discussing medical diagnosis and plan with the patient/family. Imaging, labs and tests included in this note have been reviewed and interpreted independently by me.   Chi Rodman Pickle, MD [facility] 10/19/2020 1:43 PM

## 2020-10-21 DIAGNOSIS — F3341 Major depressive disorder, recurrent, in partial remission: Secondary | ICD-10-CM | POA: Diagnosis not present

## 2020-10-21 DIAGNOSIS — F900 Attention-deficit hyperactivity disorder, predominantly inattentive type: Secondary | ICD-10-CM | POA: Diagnosis not present

## 2020-10-27 NOTE — Telephone Encounter (Signed)
ATC Patient. LM to call back to schedule Nucala injection. 

## 2020-10-30 NOTE — Telephone Encounter (Signed)
ATC Patient.  LM for patient to call and schedule Nucala injection. Message closed per office policy.

## 2020-11-10 DIAGNOSIS — E669 Obesity, unspecified: Secondary | ICD-10-CM | POA: Diagnosis not present

## 2020-11-10 DIAGNOSIS — K219 Gastro-esophageal reflux disease without esophagitis: Secondary | ICD-10-CM | POA: Diagnosis not present

## 2020-11-10 DIAGNOSIS — I10 Essential (primary) hypertension: Secondary | ICD-10-CM | POA: Diagnosis not present

## 2020-11-10 DIAGNOSIS — Z1211 Encounter for screening for malignant neoplasm of colon: Secondary | ICD-10-CM | POA: Diagnosis not present

## 2020-11-11 DIAGNOSIS — F3341 Major depressive disorder, recurrent, in partial remission: Secondary | ICD-10-CM | POA: Diagnosis not present

## 2020-11-11 DIAGNOSIS — F900 Attention-deficit hyperactivity disorder, predominantly inattentive type: Secondary | ICD-10-CM | POA: Diagnosis not present

## 2020-11-12 ENCOUNTER — Encounter: Payer: Self-pay | Admitting: Pulmonary Disease

## 2020-11-18 ENCOUNTER — Telehealth: Payer: Self-pay | Admitting: Pulmonary Disease

## 2020-11-18 NOTE — Telephone Encounter (Signed)
ATC Patient to see if she would be able to schedule her next Nucala injection.  LM to call back. I have openings 11/20/20.

## 2020-11-18 NOTE — Telephone Encounter (Signed)
Patient scheduled 11/20/20 at 3pm for Nucala injection.

## 2020-11-20 ENCOUNTER — Other Ambulatory Visit: Payer: Self-pay

## 2020-11-20 ENCOUNTER — Ambulatory Visit (INDEPENDENT_AMBULATORY_CARE_PROVIDER_SITE_OTHER): Payer: BC Managed Care – PPO

## 2020-11-20 DIAGNOSIS — J4550 Severe persistent asthma, uncomplicated: Secondary | ICD-10-CM

## 2020-11-20 MED ORDER — MEPOLIZUMAB 100 MG ~~LOC~~ SOLR
100.0000 mg | Freq: Once | SUBCUTANEOUS | Status: AC
  Administered 2020-11-20: 100 mg via SUBCUTANEOUS

## 2020-11-20 NOTE — Progress Notes (Signed)
Have you been hospitalized within the last 10 days?  No Do you have a fever?  No Do you have a cough?  No Do you have a headache or sore throat? No  

## 2020-11-22 ENCOUNTER — Other Ambulatory Visit (HOSPITAL_COMMUNITY): Payer: Self-pay | Admitting: Pharmacy Technician

## 2020-11-23 ENCOUNTER — Telehealth: Payer: Self-pay | Admitting: Pharmacy Technician

## 2020-11-23 DIAGNOSIS — Z7952 Long term (current) use of systemic steroids: Secondary | ICD-10-CM

## 2020-11-23 DIAGNOSIS — F900 Attention-deficit hyperactivity disorder, predominantly inattentive type: Secondary | ICD-10-CM | POA: Diagnosis not present

## 2020-11-23 DIAGNOSIS — J4550 Severe persistent asthma, uncomplicated: Secondary | ICD-10-CM

## 2020-11-23 DIAGNOSIS — F3341 Major depressive disorder, recurrent, in partial remission: Secondary | ICD-10-CM | POA: Diagnosis not present

## 2020-11-23 NOTE — Telephone Encounter (Signed)
Submitted a Prior Authorization request to [employer] for [facility] via Cover My Meds. Will update once we receive a response.    KEY: [id number]

## 2020-11-30 NOTE — Telephone Encounter (Signed)
Submitted a Prior Authorization request to [employer] for [place] via Cover My Meds for PHARMACY benefit. Denied through medical benefit - can appeal if patient does not have physical/medical disability that prevent from self-administering and does not have someone to help give the injection.  Will update once we receive a response.  Key: [id number]

## 2020-11-30 NOTE — Telephone Encounter (Signed)
Request was denied due to patient does not meet medical necessity (must self inject) REF# [id number]  Will follow up with appeal.

## 2020-12-02 NOTE — Telephone Encounter (Addendum)
Received notification from [facility] regarding a prior authorization for [place]. Authorization has been APPROVED from 11/30/20 to 11/29/21.   Authorization # [id number] Phone # [phone]  Patient scheduled with pharmacy clinic on 12/21/20 to transition to home admin of Nucala pen  Copay: $1720.73 per 28 days. Able to fill thru [facility] and eligible for copay card. Left VM requesting return call to receive consent to sign up for copay card on her behalf.  Knox Saliva, PharmD, MPH Clinical Pharmacist (Rheumatology and Pulmonology)

## 2020-12-07 NOTE — Telephone Encounter (Signed)
Nucala Copay Card:  Member ID: [id number]  Confirmation number: [id number]  Group Number: [id number]  Rx Bin: [id number]  PCN: [id number]   Nucala administration copay card:  Member ID: [id number]  16 digit card number: [id number]  CVC # [id number]  Expiration Date: 2025-06-25

## 2020-12-08 ENCOUNTER — Other Ambulatory Visit: Payer: Self-pay | Admitting: Pulmonary Disease

## 2020-12-08 DIAGNOSIS — F900 Attention-deficit hyperactivity disorder, predominantly inattentive type: Secondary | ICD-10-CM | POA: Diagnosis not present

## 2020-12-08 DIAGNOSIS — F3341 Major depressive disorder, recurrent, in partial remission: Secondary | ICD-10-CM | POA: Diagnosis not present

## 2020-12-08 MED ORDER — MEPOLIZUMAB 100 MG/ML ~~LOC~~ SOAJ: 100.0000 mg | SUBCUTANEOUS | 0 refills | Status: DC

## 2020-12-08 NOTE — Telephone Encounter (Signed)
Nucala rx sent to [facility] to be couriered to clinic for transition to self-administration on 12/21/20. Routing to [employer] as FYI to collect copay

## 2020-12-09 NOTE — Telephone Encounter (Signed)
Shipment set up to deliver to clinic- zero copay with copay card

## 2020-12-11 NOTE — Telephone Encounter (Signed)
Medication has been received.

## 2020-12-21 ENCOUNTER — Other Ambulatory Visit: Payer: Self-pay

## 2020-12-21 ENCOUNTER — Ambulatory Visit: Payer: BC Managed Care – PPO | Admitting: Pharmacist

## 2020-12-21 ENCOUNTER — Other Ambulatory Visit: Payer: Self-pay | Admitting: Pulmonary Disease

## 2020-12-21 DIAGNOSIS — J4550 Severe persistent asthma, uncomplicated: Secondary | ICD-10-CM

## 2020-12-21 DIAGNOSIS — Z7952 Long term (current) use of systemic steroids: Secondary | ICD-10-CM

## 2020-12-21 MED ORDER — EPINEPHRINE 0.3 MG/0.3ML IJ SOAJ: 0.3000 mg | INTRAMUSCULAR | 2 refills | Status: DC | PRN

## 2020-12-21 MED ORDER — MEPOLIZUMAB 100 MG/ML ~~LOC~~ SOAJ: 100.0000 mg | SUBCUTANEOUS | 5 refills | Status: DC

## 2020-12-21 NOTE — Progress Notes (Signed)
HPI Patient presents today to [facility] to see pharmacy team to transition to [facility] for self-administration.  Patient reports that she has not had to use rescue inhaler since January.  Does not use nebs very frequently at all.    Respiratory Medications Current: Symbicort  Patient reports no known adherence challenges  OBJECTIVE Allergies  Allergen Reactions  . Amoxicillin Anaphylaxis, Hives and Itching  . Black Walnut Pollen Allergy Skin Test Anaphylaxis  . Penicillins Anaphylaxis    Has patient had a PCN reaction causing immediate rash, facial/tongue/throat swelling, SOB or lightheadedness with hypotension: Yes Has patient had a PCN reaction causing severe rash involving mucus membranes or skin necrosis: No Has patient had a PCN reaction that required hospitalization Yes Has patient had a PCN reaction occurring within the last 10 years: No If all of the above answers are "NO", then may proceed with Cephalosporin use.   . Sulfa Antibiotics Hives and Other (See Comments)    Hives, mouth blisters   . Chloroquine Other (See Comments)    Renal dysfunction  . Ciprofloxacin Hives  . Phenergan [Promethazine Hcl] Other (See Comments)    Caused "severe drop in blood pressure."  . Sulfamethoxazole Hives  . Other Other (See Comments)    From allergy testing--Canteloupe  . Shellfish Allergy Itching and Rash  . Vancomycin Cross Reactors Other (See Comments)    "RED MAN SYNDROME"  Pt stated she had no reaction to Vancomycin when it is given slow    Outpatient Encounter Medications as of 12/21/2020  Medication Sig Note  . ADDERALL XR 20 MG 24 hr capsule Take 20 mg by mouth daily as needed (concentration).  12/01/2016: Only takes when in school or writing papers  . azithromycin (ZITHROMAX) 250 MG tablet Zpack taper as directed for asthma exacerbation with bronchitis   . budesonide-formoterol (SYMBICORT) 160-4.5 MCG/ACT inhaler Inhale 2 puffs into the lungs 2 (two) times daily.    . chlorpheniramine-HYDROcodone (TUSSIONEX PENNKINETIC ER) 10-8 MG/5ML SUER Take 5 mLs by mouth 2 (two) times daily.   Marland Kitchen desmopressin (DDAVP) 0.1 MG tablet Take 0.1 mg by mouth 2 (two) times daily.   . diclofenac Sodium (VOLTAREN) 1 % GEL Apply 4 g topically 4 (four) times daily.   Marland Kitchen doxycycline (VIBRA-TABS) 100 MG tablet Take 1 tablet (100 mg total) by mouth 2 (two) times daily.   Marland Kitchen EPINEPHrine 0.3 mg/0.3 mL IJ SOAJ injection Inject 0.3 mLs (0.3 mg total) into the muscle as needed for anaphylaxis.   Marland Kitchen estradiol (CLIMARA - DOSED IN MG/24 HR) 0.0375 mg/24hr patch Place 0.0375 mg onto the skin once a week. saturdays   . ipratropium (ATROVENT) 0.02 % nebulizer solution Take 2.5 mLs (0.5 mg total) by nebulization every 6 (six) hours as needed for wheezing or shortness of breath. icd- J42   . Mepolizumab 100 MG/ML SOAJ Inject 100 mg into the skin every 28 (twenty-eight) days. Deliver to clinic: [address], [place], [place], [place], [address]   . montelukast (SINGULAIR) 10 MG tablet TAKE 1 TABLET BY MOUTH EVERYDAY AT BEDTIME   . OVER THE COUNTER MEDICATION Take 6 tablets by mouth daily. Smarty Pants Vitamins   . predniSONE (DELTASONE) 10 MG tablet Take 4 tabs po daily x 2 days; then 3 tabs for 2 days; then 2 tabs for 2 days; then 1 tab for 2 days   . PROAIR HFA 108 (90 Base) MCG/ACT inhaler Inhale 2 puffs into the lungs every 6 (six) hours as needed for wheezing or  shortness of breath.   . Probiotic CAPS Take 1 capsule by mouth daily.    Marland Kitchen Respiratory Therapy Supplies (FLUTTER) DEVI Use flutter valve 3 times a day   . rosuvastatin (CRESTOR) 20 MG tablet Take 20 mg by mouth daily.   Marland Kitchen topiramate (TOPAMAX) 50 MG tablet Take 150 mg by mouth daily.   . valACYclovir (VALTREX) 1000 MG tablet Take two tablets as a single dose prn cold sore. (Patient taking differently: Take 2,000 mg by mouth daily as needed. Take two tablets as a single dose prn cold sore.)    Facility-Administered Encounter Medications  as of 12/21/2020  Medication  . Mepolizumab SOLR 100 mg  . Mepolizumab SOLR 100 mg  . Mepolizumab SOLR 100 mg  . Mepolizumab SOLR 100 mg  . Mepolizumab SOLR 100 mg  . Mepolizumab SOLR 100 mg  . Mepolizumab SOLR 100 mg  . Mepolizumab SOLR 100 mg  . Mepolizumab SOLR 100 mg  . Mepolizumab SOLR 100 mg  . Mepolizumab SOLR 100 mg     Immunization History  Administered Date(s) Administered  . Influenza,inj,Quad PF,6+ Mos 07/01/2016, 07/14/2017, 05/27/2019, 05/27/2020  . PFIZER(Purple Top)SARS-COV-2 Vaccination 12/12/2019, 01/06/2020, 07/21/2020  . Tdap 07/14/2014     PFTs PFT Results Latest Ref Rng & Units 08/07/2018 07/25/2018  FVC-Pre L 3.53 3.61  FVC-Predicted Pre % 84 86  FVC-Post L 3.10 3.07  FVC-Predicted Post % 74 73  Pre FEV1/FVC % % 82 82  Post FEV1/FCV % % 86 88  FEV1-Pre L 2.88 2.97  FEV1-Predicted Pre % 87 90  FEV1-Post L 2.67 2.69  DLCO uncorrected ml/min/mmHg - 22.39  DLCO UNC% % - 72  DLVA Predicted % - 86  TLC L - 6.18  TLC % Predicted % - 106  RV % Predicted % - 132     Eosinophils Most recent blood eosinophil count was 252 cells/microL taken on 09/25/18.   IgE: 10 kU/L - 10/03/2018   Assessment   1. Biologics training (Mepolizumab (Nucala) o MOA: Not fully understood. It does act an interleukin-5 (IL-5) antagonist monoclonal antibody that reduces the production and survival of eosinophils by blocking the binding of IL-5 to the alpha chain of the receptor complex on the eosinophil cell surface. o Response to therapy: ??? o Side effects: headache (19%), injection site reaxtion (7-15%), antibody development (6%), backache (5%), fatigue (5%) o Dosing: 100-300 mg subQ every 4 weeks o Administration:  1. 100mg  every 28 days 2. Do not shake the reconstituted solution as this could lead to product foaming or precipitation. 3. The solution should be clear to opalescent and colorless to pale yellow or pale brown, essentially particle free. Small air bubbles,  however, are expected and acceptable. If particulate matter remains in the solution or if the solution appears cloudy or milky, discard the solution.   Patient able to self-administer in right thigh use Nucala 100mg /mL auto-injector NDC: [address]-84 Lot; 8D7D Exp: 04/2023  2. Medication Reconciliation  A drug regimen assessment was performed, including review of allergies, interactions, disease-state management, dosing and immunization history. Medications were reviewed with the patient, including name, instructions, indication, goals of therapy, potential side effects, importance of adherence, and safe use.  Drug interaction(s): none  3. Immunizations  UTD on influenza vaccine Has received 3 COVID19 vaccines - advised to receive COVID19 booster at the end of April 2022 No history of pneumonia vaccine . Patient advised to receive pneumonia vaccine - she will f/u with PCP since she visit this upcoming month.  PLAN -  Continue Nucala 100mg  every 28 days. Rx sent to [facility] with copay card information. Patient provided with pharmacy phone number as well sa 2022 calendar with Nucala due dates highlighted. - Discussed traveling with medication. Patient planning to go to [place] in June - advised that medication is only good for 7 days at room temp, cannot be returned to fridge. Patient will delay her dose by 3-4 days and take it when she returns. Advised her to notify pharmacy during prior month so they have the correct phone number to reach out to schedule shipment while she's out of town - Continue montelukast 10mg  nightly and Symbicort 2 puffs twice daily - Refill for Epipen sent to patient's local pharmacy.  All questions encouraged and answered.  Instructed patient to reach out with any further questions or concerns.  Thank you for allowing pharmacy to participate in this patient's care.  Knox Saliva, PharmD, MPH Clinical Pharmacist (Rheumatology and Pulmonology)

## 2021-01-06 DIAGNOSIS — F3341 Major depressive disorder, recurrent, in partial remission: Secondary | ICD-10-CM | POA: Diagnosis not present

## 2021-01-06 DIAGNOSIS — F900 Attention-deficit hyperactivity disorder, predominantly inattentive type: Secondary | ICD-10-CM | POA: Diagnosis not present

## 2021-01-11 ENCOUNTER — Other Ambulatory Visit (HOSPITAL_COMMUNITY): Payer: Self-pay

## 2021-01-12 ENCOUNTER — Other Ambulatory Visit (HOSPITAL_COMMUNITY): Payer: Self-pay

## 2021-01-14 ENCOUNTER — Ambulatory Visit: Payer: BC Managed Care – PPO | Admitting: Specialist

## 2021-01-14 ENCOUNTER — Encounter: Payer: Self-pay | Admitting: Specialist

## 2021-01-14 ENCOUNTER — Ambulatory Visit: Payer: Self-pay

## 2021-01-14 ENCOUNTER — Other Ambulatory Visit: Payer: Self-pay

## 2021-01-14 VITALS — BP 121/84 | HR 96 | Ht 69.0 in | Wt 225.0 lb

## 2021-01-14 DIAGNOSIS — M5432 Sciatica, left side: Secondary | ICD-10-CM

## 2021-01-14 DIAGNOSIS — M4326 Fusion of spine, lumbar region: Secondary | ICD-10-CM

## 2021-01-14 MED ORDER — TRAMADOL HCL 50 MG PO TABS: 50.0000 mg | ORAL_TABLET | Freq: Four times a day (QID) | ORAL | 0 refills | Status: AC | PRN

## 2021-01-14 NOTE — Progress Notes (Signed)
Office Visit Note   Patient: [name] [name]           Date of Birth: 09/17/1966           MRN: [id number] Visit Date: 01/14/2021              Requested by: Prince Solian, MD [address] [place],  [place] [address] PCP: Prince Solian, MD   Assessment & Plan: Visit Diagnoses:  1. Sciatica, left side   2. Fusion of spine of lumbar region     Plan:Avoid frequent bending and stooping  No lifting greater than 10 lbs. May use ice or moist heat for pain. Weight loss is of benefit. Best medication for lumbar disc disease is arthritis medications like diclofenac, and for the hand diclofenac gel. Exercise is important to improve your indurance and does allow people to function better inspite of back pain.  MRI of the lumbar spine with and without contrast evaluate for left L5-S1 HNP vs lateral recess stenosis. voltaren gel 2gm applied to hand painful arthritis joint up to 3-4 times per day. Tramadol 50 mg every 6 hours for pain do not take more than this amount due to risk of interaction with zoloft in larger quantities.   Follow-Up Instructions: Return in about 2 weeks (around 01/28/2021).   Orders:  Orders Placed This Encounter  Procedures  . XR Lumbar Spine 2-3 Views  . MR Lumbar Spine W Wo Contrast   Meds ordered this encounter  Medications  . traMADol (ULTRAM) 50 MG tablet    Sig: Take 1 tablet (50 mg total) by mouth every 6 (six) hours as needed for up to 5 days for moderate pain.    Dispense:  30 tablet    Refill:  0      Procedures: No procedures performed   Clinical Data: No additional findings.   Subjective: Chief Complaint  Patient presents with  . Left Leg - Pain    55 year old female with history of lumbar recurring HNP and eventually had lumbar fusion surgery done L4-5, she had had bilateral microdiscectomies L4-5 and right L4-5 and L5-S1 discectomies. Then TLIF L4-5 right side. Now with about 7 weeks post beginning of left leg posterior thigh  and buttock sciatica. She started with prednisone dose pak and found some baclofen then for the pain. She has had some improvement with the use of the prednisone. She is also having arthritis pain into right hand index PIP joint. She is having pain with walking and with sitting for long periods. She has a four hour class Thursdays and has difficulty sitting and she has told her professor.    Review of Systems   Objective: Vital Signs: BP 121/84   Pulse 96   Physical Exam Constitutional:      Appearance: She is well-developed.  HENT:     Head: Normocephalic and atraumatic.  Eyes:     Pupils: Pupils are equal, round, and reactive to light.  Pulmonary:     Effort: Pulmonary effort is normal.     Breath sounds: Normal breath sounds.  Abdominal:     General: Bowel sounds are normal.     Palpations: Abdomen is soft.  Musculoskeletal:     Cervical back: Normal range of motion and neck supple.     Lumbar back: Negative right straight leg raise test and negative left straight leg raise test.  Skin:    General: Skin is warm and dry.  Neurological:     Mental  Status: She is alert and oriented to person, place, and time.  Psychiatric:        Behavior: Behavior normal.        Thought Content: Thought content normal.        Judgment: Judgment normal.   Back Exam   Tenderness  The patient is experiencing tenderness in the lumbar.  Range of Motion  Extension:  abnormal  Flexion:  50 abnormal  Lateral bend right:  abnormal  Lateral bend left:  abnormal  Rotation right:  abnormal  Rotation left:  abnormal   Muscle Strength  Right Quadriceps:  5/5  Left Quadriceps:  5/5  Right Hamstrings:  5/5  Left Hamstrings:  5/5   Tests  Straight leg raise right: negative Straight leg raise left: negative  Reflexes  Patellar:  0/4 Achilles:  0/4  Other  Toe walk: normal Heel walk: normal Sensation: normal Gait: normal   Comments:  Hamstring tightness, no focal motor deficit,  primary back pain.     Specialty Comments:  No specialty comments available.  Imaging: XR Lumbar Spine 2-3 Views  Result Date: 01/14/2021 AP and lateral  Flexion and extension radiographs show the lumbar interbody fusion L4-5 with hardware and cage with apparent solid fusion, no motion present there is increasing narrowing of the disc height at L5-S1 and previous right laminotomy. The disc at L3-4 is well maintained thought there is slight sclerosis of the endplates at A8-3, more significant sclerosis L5-S1.    PMFS History: Patient Active Problem List   Diagnosis Date Noted  . Spinal stenosis of lumbar region 12/08/2016    Priority: High    Class: Chronic  . Herniated intervertebral disc of lumbar spine 12/06/2016    Priority: High    Class: Acute  . Spinal stenosis of lumbar region with neurogenic claudication 03/19/2012    Priority: High    Class: Acute  . Hypokalemia 10/21/2015    Priority: Medium  . Spinal stenosis in cervical region 10/21/2015    Priority: Medium    Class: Chronic  . Pituitary mass ([facility]) 10/21/2015    Priority: Medium  . Herniated nucleus pulposus, lumbar 07/25/2012    Priority: Medium    Class: Acute  . HNP (herniated nucleus pulposus), lumbar 03/19/2012    Priority: Medium    Class: Acute  . Severe persistent asthma with acute exacerbation 05/27/2019  . Severe persistent asthma dependent on systemic steroids without complication 41/96/2229  . Dyspnea on exertion 09/26/2018  . Bronchitis with asthma, acute 08/22/2018  . Acute non-recurrent maxillary sinusitis 08/22/2018  . Adrenal insufficiency ([facility]) 12/08/2016  . History of pituitary adenoma 12/08/2016  . Recurrent herniation of lumbar disc 12/06/2016  . Trochanteric bursitis, right hip 09/08/2016  . S/P lumbar discectomy 10/16/2015  . History of lumbar laminectomy for spinal cord decompression 09/23/2014  . Herniated nucleus pulposus, L4-5 02/25/2014   Past Medical History:  Diagnosis  Date  . 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 Arthritis    hnp-lumbar   . Blood dyscrasia    hx probable blood clot after ankle surgery  not definitive but treated per pt  . Cancer ([facility])    "early evolvng melanoma "- legs   . Depression   . 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)   . Family history of adverse reaction to anesthesia    mom has N/V - zofran works  . Heart murmur    years ago-echo 05 no  problems  . High cholesterol   . History of blood transfusion    "16 so far" (07/25/2012)  . History of bronchitis   . History of UTI   . Hypertension    no med for 1 yr  . 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 Migraines   . Peripheral vascular disease ([facility])    ? dvt 2011 tx as preventive although did not show on scan after ankle surgery  . Pneumonia ~ 2009; 2010  . PONV (postoperative nausea and vomiting)    last 2 surgeries less nausea  . PONV (postoperative nausea and vomiting)    none with last surgery-be extra careful with nose patient haas septal hole     Family History  Problem Relation Age of Onset  . Hypertension Mother   . Coronary artery disease Mother   . Hypertension Father   . Lung cancer Father     Past Surgical History:  Procedure Laterality Date  . APPENDECTOMY     denies  . BLADDER SUSPENSION  2006  . BREAST BIOPSY  ~ 2010   left breast, benign   . BREAST LUMPECTOMY  ~ 2010   left  . CHOLECYSTECTOMY  2004  . COLONOSCOPY    . DIAGNOSTIC LAPAROSCOPY     2 cysts removed fr. R ovary   . LAMINECTOMY AND MICRODISCECTOMY LUMBAR SPINE  07/25/2012   redo right L4-5 microdiscectomy  . LUMBAR FUSION  12/06/2016   L4 L 5  . LUMBAR LAMINECTOMY  03/19/2012   Procedure: MICRODISCECTOMY LUMBAR LAMINECTOMY;  Surgeon: [name], MD;  Location: [facility];  Service: Orthopedics;  Laterality: Right;  MIS Right L3-4 lateral recess decompression and Right L4-5 Microdiscectomy  . LUMBAR LAMINECTOMY  07/25/2012   Procedure:  MICRODISCECTOMY LUMBAR LAMINECTOMY;  Surgeon: [name], MD;  Location: [facility];  Service: Orthopedics;  Laterality: N/A;  Re-do Right L4-5 Microdiscectomy  . LUMBAR LAMINECTOMY N/A 02/25/2014   Procedure: Bilateral L4-5 MICRODISCECTOMY with MIS;  Surgeon: [name], MD;  Location: [facility];  Service: Orthopedics;  Laterality: N/A;  . LUMBAR LAMINECTOMY N/A 09/23/2014   Procedure: RIGHT L4-5 AND L5-S1 MICRODISCECTOMIES;  Surgeon: [name], MD;  Location: [facility];  Service: Orthopedics;  Laterality: N/A;  . LUMBAR LAMINECTOMY N/A 10/16/2015   Procedure: REDO MICRODISCECTOMY L4-5;  Surgeon: [name], MD;  Location: [facility];  Service: Orthopedics;  Laterality: N/A;  . MELANOMA EXCISION  06/2011   right lower leg; "early evolving"  . REPAIR PERONEAL TENDONS ANKLE  2010   right  . SHOULDER ARTHROSCOPY  ~ 2009   "frozen shoulder manipulation; right" (07/25/2012)  . TONSILLECTOMY  1977  . TRANSPHENOIDAL PITUITARY RESECTION  04/19/17   at [facility]   have Ovaries   Social History   Occupational History  . Not on file  Tobacco Use  . Smoking status: Never Smoker  . Smokeless tobacco: Never Used  Vaping Use  . Vaping Use: Never used  Substance and Sexual Activity  . Alcohol use: Yes    Alcohol/week: 2.0 standard drinks    Types: 2 Glasses of wine per week    Comment: occasional  . Drug use: No  . Sexual activity: Yes

## 2021-01-14 NOTE — Patient Instructions (Addendum)
Avoid frequent bending and stooping  No lifting greater than 10 lbs. May use ice or moist heat for pain. Weight loss is of benefit. Best medication for lumbar disc disease is arthritis medications like diclofenac, and for the hand diclofenac gel. Exercise is important to improve your indurance and does allow people to function better inspite of back pain.  MRI of the lumbar spine with and without contrast evaluate for left L5-S1 HNP vs lateral recess stenosis. voltaren gel 2gm applied to hand painful arthritis joint up to 3-4 times per day. Tramadol 50 mg every 6 hours for pain do not take more than this amount due to risk of interaction with zoloft in larger quantities.

## 2021-01-15 ENCOUNTER — Other Ambulatory Visit (HOSPITAL_COMMUNITY): Payer: Self-pay

## 2021-01-18 ENCOUNTER — Other Ambulatory Visit (HOSPITAL_COMMUNITY): Payer: Self-pay

## 2021-01-18 MED FILL — Mepolizumab Subcutaneous Solution Auto-injector 100 MG/ML: SUBCUTANEOUS | 28 days supply | Qty: 1 | Fill #0 | Status: AC

## 2021-01-19 ENCOUNTER — Ambulatory Visit: Payer: BC Managed Care – PPO | Admitting: Pulmonary Disease

## 2021-01-20 ENCOUNTER — Other Ambulatory Visit (HOSPITAL_COMMUNITY): Payer: Self-pay

## 2021-01-24 ENCOUNTER — Ambulatory Visit
Admission: RE | Admit: 2021-01-24 | Discharge: 2021-01-24 | Disposition: A | Discharge status: Home / Self Care | Payer: BC Managed Care – PPO | Source: Ambulatory Visit | Attending: Specialist | Admitting: Specialist

## 2021-01-24 ENCOUNTER — Other Ambulatory Visit: Payer: Self-pay

## 2021-01-24 DIAGNOSIS — M4326 Fusion of spine, lumbar region: Secondary | ICD-10-CM | POA: Diagnosis not present

## 2021-01-24 DIAGNOSIS — M5127 Other intervertebral disc displacement, lumbosacral region: Secondary | ICD-10-CM | POA: Diagnosis not present

## 2021-01-24 MED ORDER — GADOBENATE DIMEGLUMINE 529 MG/ML IV SOLN
20.0000 mL | Freq: Once | INTRAVENOUS | Status: AC | PRN
  Administered 2021-01-24: 20 mL via INTRAVENOUS

## 2021-02-02 DIAGNOSIS — F3341 Major depressive disorder, recurrent, in partial remission: Secondary | ICD-10-CM | POA: Diagnosis not present

## 2021-02-02 DIAGNOSIS — F900 Attention-deficit hyperactivity disorder, predominantly inattentive type: Secondary | ICD-10-CM | POA: Diagnosis not present

## 2021-02-15 ENCOUNTER — Encounter: Payer: Self-pay | Admitting: Specialist

## 2021-02-15 ENCOUNTER — Ambulatory Visit: Payer: BC Managed Care – PPO | Admitting: Specialist

## 2021-02-15 ENCOUNTER — Other Ambulatory Visit: Payer: Self-pay

## 2021-02-15 VITALS — BP 130/85 | HR 85 | Ht 69.0 in | Wt 227.0 lb

## 2021-02-15 DIAGNOSIS — M47816 Spondylosis without myelopathy or radiculopathy, lumbar region: Secondary | ICD-10-CM | POA: Diagnosis not present

## 2021-02-15 DIAGNOSIS — Z9889 Other specified postprocedural states: Secondary | ICD-10-CM | POA: Diagnosis not present

## 2021-02-15 DIAGNOSIS — M7989 Other specified soft tissue disorders: Secondary | ICD-10-CM | POA: Diagnosis not present

## 2021-02-15 DIAGNOSIS — M4326 Fusion of spine, lumbar region: Secondary | ICD-10-CM | POA: Diagnosis not present

## 2021-02-15 DIAGNOSIS — M5432 Sciatica, left side: Secondary | ICD-10-CM

## 2021-02-15 MED ORDER — ETODOLAC 400 MG PO TABS: 400.0000 mg | ORAL_TABLET | Freq: Two times a day (BID) | ORAL | 3 refills | Status: DC

## 2021-02-15 MED ORDER — PREGABALIN 50 MG PO CAPS: 50.0000 mg | ORAL_CAPSULE | Freq: Two times a day (BID) | ORAL | 0 refills | Status: DC

## 2021-02-15 NOTE — Addendum Note (Signed)
Addended by: Basil Dess on: 02/15/2021 02:54 PM   Modules accepted: Orders

## 2021-02-15 NOTE — Patient Instructions (Signed)
Plan: Avoid frequent bending and stooping  No lifting greater than 10 lbs. May use ice or moist heat for pain. Weight loss is of benefit. Best medication for lumbar disc disease is arthritis medications like motrin, celebrex and naprosyn. Exercise is important to improve your indurance and does allow people to function better inspite of back pain. Recommend physical therapy.

## 2021-02-15 NOTE — Progress Notes (Signed)
Office Visit Note   Patient: [name] [name]           Date of Birth: Sep 11, 1966           MRN: [id number] Visit Date: 02/15/2021              Requested by: Prince Solian, MD [address] [place],  [place] [address] PCP: Prince Solian, MD   Assessment & Plan: Visit Diagnoses:  1. Fusion of spine of lumbar region   2. Sciatica, left side   3. History of lumbar laminectomy for spinal cord decompression   4. Spondylosis without myelopathy or radiculopathy, lumbar region     Plan: Avoid frequent bending and stooping  No lifting greater than 10 lbs. May use ice or moist heat for pain. Weight loss is of benefit. Best medication for lumbar disc disease is arthritis medications like motrin, celebrex and naprosyn. Exercise is important to improve your indurance and does allow people to function better inspite of back pain. Recommend physical therapy.   Follow-Up Instructions: No follow-ups on file.   Orders:  No orders of the defined types were placed in this encounter.  No orders of the defined types were placed in this encounter.     Procedures: No procedures performed   Clinical Data: No additional findings.   Subjective: Chief Complaint  Patient presents with  . Lower Back - Follow-up    MRI Review    55 year old female with history of low back pain and previous disc herniation surgeries and recurrent HNPs. She has had some increasing pain into the  Buttocks and left posterior thigh. She is under a bit of increased stress due to educational  Needs and home situation but she has graduated from [employer]. She is considering job offers now.    Review of Systems  Constitutional: Negative.   HENT: Negative.   Eyes: Negative.   Respiratory: Negative.   Cardiovascular: Negative.   Endocrine: Negative.   Genitourinary: Negative.   Musculoskeletal: Positive for back pain.  Allergic/Immunologic: Negative.   Neurological: Negative.   Hematological:  Negative.   Psychiatric/Behavioral: Negative.      Objective: Vital Signs: BP 130/85 (BP Location: Left Arm, Patient Position: Sitting)   Pulse 85   Ht 5\' 9"  (1.753 m)   Wt 227 lb (103 kg)   BMI 33.52 kg/m   Physical Exam Constitutional:      Appearance: She is well-developed.  HENT:     Head: Normocephalic and atraumatic.  Eyes:     Pupils: Pupils are equal, round, and reactive to light.  Pulmonary:     Effort: Pulmonary effort is normal.     Breath sounds: Normal breath sounds.  Abdominal:     General: Bowel sounds are normal.     Palpations: Abdomen is soft.  Musculoskeletal:        General: Normal range of motion.     Cervical back: Normal range of motion and neck supple.  Skin:    General: Skin is warm and dry.  Neurological:     Mental Status: She is alert and oriented to person, place, and time.  Psychiatric:        Behavior: Behavior normal.        Thought Content: Thought content normal.        Judgment: Judgment normal.     Ortho Exam  Specialty Comments:  No specialty comments available.  Imaging: No results found.   PMFS History: Patient Active Problem List  Diagnosis Date Noted  . Spinal stenosis of lumbar region 12/08/2016    Priority: High    Class: Chronic  . Herniated intervertebral disc of lumbar spine 12/06/2016    Priority: High    Class: Acute  . Spinal stenosis of lumbar region with neurogenic claudication 03/19/2012    Priority: High    Class: Acute  . Hypokalemia 10/21/2015    Priority: Medium  . Spinal stenosis in cervical region 10/21/2015    Priority: Medium    Class: Chronic  . Pituitary mass ([facility]) 10/21/2015    Priority: Medium  . Herniated nucleus pulposus, lumbar 07/25/2012    Priority: Medium    Class: Acute  . HNP (herniated nucleus pulposus), lumbar 03/19/2012    Priority: Medium    Class: Acute  . Severe persistent asthma with acute exacerbation 05/27/2019  . Severe persistent asthma dependent on systemic  steroids without complication 41/32/4401  . Dyspnea on exertion 09/26/2018  . Bronchitis with asthma, acute 08/22/2018  . Acute non-recurrent maxillary sinusitis 08/22/2018  . Adrenal insufficiency ([facility]) 12/08/2016  . History of pituitary adenoma 12/08/2016  . Recurrent herniation of lumbar disc 12/06/2016  . Trochanteric bursitis, right hip 09/08/2016  . S/P lumbar discectomy 10/16/2015  . History of lumbar laminectomy for spinal cord decompression 09/23/2014  . Herniated nucleus pulposus, L4-5 02/25/2014   Past Medical History:  Diagnosis Date  . 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 Arthritis    hnp-lumbar   . Blood dyscrasia    hx probable blood clot after ankle surgery  not definitive but treated per pt  . Cancer ([facility])    "early evolvng melanoma "- legs   . Depression   . 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 Family history of adverse reaction to anesthesia    mom has N/V - zofran works  . Heart murmur    years ago-echo 05 no problems  . High cholesterol   . History of blood transfusion    "16 so far" (07/25/2012)  . History of bronchitis   . History of UTI   . Hypertension    no med for 1 yr  . 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 Migraines   . Peripheral vascular disease ([facility])    ? dvt 2011 tx as preventive although did not show on scan after ankle surgery  . Pneumonia ~ 2009; 2010  . PONV (postoperative nausea and vomiting)    last 2 surgeries less nausea  . PONV (postoperative nausea and vomiting)    none with last surgery-be extra careful with nose patient haas septal hole     Family History  Problem Relation Age of Onset  . Hypertension Mother   . Coronary artery disease Mother   . Hypertension Father   . Lung cancer Father     Past Surgical History:  Procedure Laterality Date  . APPENDECTOMY     denies  . BLADDER SUSPENSION  2006  . BREAST BIOPSY  ~ 2010   left breast, benign   . BREAST  LUMPECTOMY  ~ 2010   left  . CHOLECYSTECTOMY  2004  . COLONOSCOPY    . DIAGNOSTIC LAPAROSCOPY     2 cysts removed fr. R ovary   . LAMINECTOMY AND MICRODISCECTOMY LUMBAR SPINE  07/25/2012   redo right L4-5 microdiscectomy  . LUMBAR FUSION  12/06/2016   L4 L 5  . LUMBAR LAMINECTOMY  03/19/2012   Procedure:  MICRODISCECTOMY LUMBAR LAMINECTOMY;  Surgeon: [name], MD;  Location: [facility];  Service: Orthopedics;  Laterality: Right;  MIS Right L3-4 lateral recess decompression and Right L4-5 Microdiscectomy  . LUMBAR LAMINECTOMY  07/25/2012   Procedure: MICRODISCECTOMY LUMBAR LAMINECTOMY;  Surgeon: [name], MD;  Location: [facility];  Service: Orthopedics;  Laterality: N/A;  Re-do Right L4-5 Microdiscectomy  . LUMBAR LAMINECTOMY N/A 02/25/2014   Procedure: Bilateral L4-5 MICRODISCECTOMY with MIS;  Surgeon: [name], MD;  Location: [facility];  Service: Orthopedics;  Laterality: N/A;  . LUMBAR LAMINECTOMY N/A 09/23/2014   Procedure: RIGHT L4-5 AND L5-S1 MICRODISCECTOMIES;  Surgeon: [name], MD;  Location: [facility];  Service: Orthopedics;  Laterality: N/A;  . LUMBAR LAMINECTOMY N/A 10/16/2015   Procedure: REDO MICRODISCECTOMY L4-5;  Surgeon: [name], MD;  Location: [facility];  Service: Orthopedics;  Laterality: N/A;  . MELANOMA EXCISION  06/2011   right lower leg; "early evolving"  . REPAIR PERONEAL TENDONS ANKLE  2010   right  . SHOULDER ARTHROSCOPY  ~ 2009   "frozen shoulder manipulation; right" (07/25/2012)  . TONSILLECTOMY  1977  . TRANSPHENOIDAL PITUITARY RESECTION  04/19/17   at [facility]   have Ovaries   Social History   Occupational History  . Not on file  Tobacco Use  . Smoking status: Never Smoker  . Smokeless tobacco: Never Used  Vaping Use  . Vaping Use: Never used  Substance and Sexual Activity  . Alcohol use: Yes    Alcohol/week: 2.0 standard drinks    Types: 2 Glasses of wine per week    Comment: occasional  . Drug use: No  . Sexual  activity: Yes

## 2021-02-15 NOTE — Addendum Note (Signed)
Addended by: Minda Ditto, Geoffery Spruce on: 02/15/2021 03:02 PM   Modules accepted: Orders

## 2021-02-17 LAB — URIC ACID: Uric Acid, Serum: 3.5 mg/dL (ref 2.5–7.0)

## 2021-02-17 LAB — ANTI-NUCLEAR AB-TITER (ANA TITER)
ANA TITER: 1:40 {titer} — ABNORMAL HIGH
ANA Titer 1: 1:40 {titer} — ABNORMAL HIGH

## 2021-02-17 LAB — C-REACTIVE PROTEIN: CRP: 1.2 mg/L (ref <8.0)

## 2021-02-17 LAB — ANA: Anti Nuclear Antibody (ANA): POSITIVE — AB

## 2021-02-17 LAB — RHEUMATOID FACTOR: Rheumatoid fact SerPl-aCnc: <14 IU/mL (ref <14)

## 2021-02-17 LAB — SEDIMENTATION RATE: Sed Rate: 14 mm/h (ref 0–30)

## 2021-02-18 ENCOUNTER — Telehealth: Payer: Self-pay

## 2021-02-18 NOTE — Telephone Encounter (Signed)
Patient called she stated she got her lab results back via mychart she is requesting a call back from [facility] regarding the results and to discuss the next step call [phone]

## 2021-02-23 ENCOUNTER — Other Ambulatory Visit (HOSPITAL_COMMUNITY): Payer: Self-pay

## 2021-03-04 ENCOUNTER — Other Ambulatory Visit (HOSPITAL_COMMUNITY): Payer: Self-pay

## 2021-03-08 ENCOUNTER — Other Ambulatory Visit (HOSPITAL_COMMUNITY): Payer: Self-pay

## 2021-03-08 MED FILL — Mepolizumab Subcutaneous Solution Auto-injector 100 MG/ML: SUBCUTANEOUS | 28 days supply | Qty: 1 | Fill #1 | Status: AC

## 2021-03-09 ENCOUNTER — Other Ambulatory Visit (HOSPITAL_COMMUNITY): Payer: Self-pay

## 2021-03-10 DIAGNOSIS — F3341 Major depressive disorder, recurrent, in partial remission: Secondary | ICD-10-CM | POA: Diagnosis not present

## 2021-03-10 DIAGNOSIS — F900 Attention-deficit hyperactivity disorder, predominantly inattentive type: Secondary | ICD-10-CM | POA: Diagnosis not present

## 2021-03-15 ENCOUNTER — Other Ambulatory Visit: Payer: Self-pay | Admitting: Specialist

## 2021-03-19 ENCOUNTER — Ambulatory Visit: Payer: BC Managed Care – PPO | Admitting: Specialist

## 2021-03-19 ENCOUNTER — Encounter: Payer: Self-pay | Admitting: Specialist

## 2021-03-19 ENCOUNTER — Other Ambulatory Visit: Payer: Self-pay

## 2021-03-19 VITALS — BP 130/85 | HR 87 | Ht 69.0 in | Wt 227.0 lb

## 2021-03-19 DIAGNOSIS — M4326 Fusion of spine, lumbar region: Secondary | ICD-10-CM | POA: Diagnosis not present

## 2021-03-19 DIAGNOSIS — M47816 Spondylosis without myelopathy or radiculopathy, lumbar region: Secondary | ICD-10-CM

## 2021-03-19 DIAGNOSIS — M5432 Sciatica, left side: Secondary | ICD-10-CM

## 2021-03-19 NOTE — Progress Notes (Signed)
Office Visit Note   Patient: [name] [name]           Date of Birth: 12-Oct-1965           MRN: [id number] Visit Date: 03/19/2021              Requested by: Prince Solian, MD [address] [place],  [place] [address] PCP: Prince Solian, MD   Assessment & Plan: Visit Diagnoses:  1. Fusion of spine of lumbar region   2. Sciatica, left side   3. Spondylosis without myelopathy or radiculopathy, lumbar region     Plan: Avoid bending, stooping and avoid lifting weights greater than 10 lbs. Avoid prolong standing and walking. Avoid frequent bending and stooping  No lifting greater than 10 lbs. May use ice or moist heat for pain. Weight loss is of benefit.  Follow-Up Instructions: Return in about 4 weeks (around 04/16/2021).   Orders:  Orders Placed This Encounter  Procedures  . Ambulatory referral to Physical Therapy   No orders of the defined types were placed in this encounter.     Procedures: No procedures performed   Clinical Data: No additional findings.   Subjective: Chief Complaint  Patient presents with  . Lower Back - Follow-up    Here for follow up on her back and her lab results    55 year old female with history of lumbar multiple discectomies for disc hermiations with DDD L5-S1 and fusion L5-S1. She has done well with less pain but recently with increasing low back pain and leg discomfort. She can not take steroids due to  Addison's disease and pituitary axis difficulties post partial pituitary resection. Underwent recent MRI due to increasing difficulty with sitting and bending and stooping. In the interval she has travelled with her husband to  [place], [place] and [place]. Has walked as much as 8 miles per day with her husband. Has questions about the results of rheumatologic studies, sed rate, CRP, ANA, RF and uric acid.  Mainly the the ANA is returning positive With 1:40 titer. 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  If her symptome worsen than I would consider a Rheumatology consult but  Generally this low a titer is not a significant abnormality requiring treatment with rheumatologic meds. However if her systemic inflamatory symptoms were to worsen I would repeat this test.   Review of Systems  Constitutional: Negative.   HENT: Negative.    Eyes: Negative.   Respiratory: Negative.    Cardiovascular: Negative.   Gastrointestinal: Negative.   Endocrine: Negative.   Genitourinary: Negative.   Musculoskeletal: Negative.   Skin: Negative.   Allergic/Immunologic: Negative.   Neurological: Negative.   Hematological: Negative.   Psychiatric/Behavioral: Negative.      Objective: Vital Signs: BP 130/85 (BP Location: Left Arm, Patient Position: Sitting)   Pulse 87   Ht 5\' 9"  (1.753 m)   Wt 227 lb (103 kg)   BMI 33.52 kg/m   Physical Exam  Back Exam   Tenderness  The patient is experiencing tenderness in the lumbar.  Other  Toe walk: normal Heel walk: normal Gait: normal  Erythema: no back redness Scars: absent  Comments:  Today she is moving well and standing and bending with out difficulty. Husband is here with her.     Specialty Comments:  No specialty comments available.  Imaging: No results found.  PMFS History: Patient Active Problem List   Diagnosis Date Noted  . Spinal stenosis of lumbar region 12/08/2016    Priority: High    Class: Chronic  . Herniated intervertebral disc of lumbar spine 12/06/2016    Priority: High    Class: Acute  . Spinal stenosis of lumbar region with neurogenic claudication 03/19/2012    Priority: High    Class: Acute  . Hypokalemia 10/21/2015    Priority: Medium  . Spinal stenosis in cervical region 10/21/2015    Priority: Medium    Class: Chronic  . Pituitary mass ([place]) 10/21/2015    Priority: Medium  . Herniated nucleus pulposus, lumbar 07/25/2012     Priority: Medium    Class: Acute  . HNP (herniated nucleus pulposus), lumbar 03/19/2012    Priority: Medium    Class: Acute  . Severe persistent asthma with acute exacerbation 05/27/2019  . Severe persistent asthma dependent on systemic steroids without complication 73/22/0254  . Dyspnea on exertion 09/26/2018  . Bronchitis with asthma, acute 08/22/2018  . Acute non-recurrent maxillary sinusitis 08/22/2018  . Adrenal insufficiency ([facility]) 12/08/2016  . History of pituitary adenoma 12/08/2016  . Recurrent herniation of lumbar disc 12/06/2016  . Trochanteric bursitis, right hip 09/08/2016  . S/P lumbar discectomy 10/16/2015  . History of lumbar laminectomy for spinal cord decompression 09/23/2014  . Herniated nucleus pulposus, L4-5 02/25/2014   Past Medical History:  Diagnosis Date  . 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 Arthritis    hnp-lumbar   . Blood dyscrasia    hx probable blood clot after ankle surgery  not definitive but treated per pt  . Cancer ([facility])    "early evolvng melanoma "- legs   . Depression   . Diabetes insipidus ([place])   . Family history of adverse reaction to anesthesia    mom has N/V - zofran works  . Heart murmur    years ago-echo 05 no problems  . High cholesterol   . History of blood transfusion    "16 so far" (07/25/2012)  . History of bronchitis   . History of UTI   . Hypertension    no med for 1 yr  . 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 Migraines   . Peripheral vascular disease ([facility])    ? dvt 2011 tx as preventive although did not show on scan after ankle surgery  . Pneumonia ~ 2009; 2010  . PONV (postoperative nausea and vomiting)    last 2 surgeries less nausea  . PONV (postoperative nausea and vomiting)    none with last surgery-be extra careful with nose patient haas septal hole     Family History  Problem Relation Age of Onset  . Hypertension Mother   . Coronary artery  disease Mother   . Hypertension Father   . Lung cancer Father     Past Surgical History:  Procedure Laterality Date  . APPENDECTOMY     denies  . BLADDER SUSPENSION  2006  . BREAST BIOPSY  ~ 2010   left breast, benign   . BREAST LUMPECTOMY  ~ 2010   left  . CHOLECYSTECTOMY  2004  . COLONOSCOPY    . DIAGNOSTIC LAPAROSCOPY     2 cysts removed fr. R ovary   . LAMINECTOMY AND MICRODISCECTOMY LUMBAR SPINE  07/25/2012   redo right L4-5 microdiscectomy  . LUMBAR FUSION  12/06/2016   L4 L 5  .  LUMBAR LAMINECTOMY  03/19/2012   Procedure: MICRODISCECTOMY LUMBAR LAMINECTOMY;  Surgeon: [name], MD;  Location: [facility];  Service: Orthopedics;  Laterality: Right;  MIS Right L3-4 lateral recess decompression and Right L4-5 Microdiscectomy  . LUMBAR LAMINECTOMY  07/25/2012   Procedure: MICRODISCECTOMY LUMBAR LAMINECTOMY;  Surgeon: [name], MD;  Location: [facility];  Service: Orthopedics;  Laterality: N/A;  Re-do Right L4-5 Microdiscectomy  . LUMBAR LAMINECTOMY N/A 02/25/2014   Procedure: Bilateral L4-5 MICRODISCECTOMY with MIS;  Surgeon: [name], MD;  Location: [facility];  Service: Orthopedics;  Laterality: N/A;  . LUMBAR LAMINECTOMY N/A 09/23/2014   Procedure: RIGHT L4-5 AND L5-S1 MICRODISCECTOMIES;  Surgeon: [name], MD;  Location: [facility];  Service: Orthopedics;  Laterality: N/A;  . LUMBAR LAMINECTOMY N/A 10/16/2015   Procedure: REDO MICRODISCECTOMY L4-5;  Surgeon: [name], MD;  Location: [facility];  Service: Orthopedics;  Laterality: N/A;  . MELANOMA EXCISION  06/2011   right lower leg; "early evolving"  . REPAIR PERONEAL TENDONS ANKLE  2010   right  . SHOULDER ARTHROSCOPY  ~ 2009   "frozen shoulder manipulation; right" (07/25/2012)  . TONSILLECTOMY  1977  . TRANSPHENOIDAL PITUITARY RESECTION  04/19/17   at [place]   have Ovaries   Social History   Occupational History  . Not on file  Tobacco Use  . Smoking status: Never  . Smokeless tobacco:  Never  Vaping Use  . Vaping Use: Never used  Substance and Sexual Activity  . Alcohol use: Yes    Alcohol/week: 2.0 standard drinks    Types: 2 Glasses of wine per week    Comment: occasional  . Drug use: No  . Sexual activity: Yes

## 2021-03-19 NOTE — Patient Instructions (Signed)
Avoid bending, stooping and avoid lifting weights greater than 10 lbs. Avoid prolong standing and walking. Avoid frequent bending and stooping  No lifting greater than 10 lbs. May use ice or moist heat for pain. Weight loss is of benefit.

## 2021-03-24 ENCOUNTER — Ambulatory Visit: Payer: BC Managed Care – PPO | Admitting: Physical Therapy

## 2021-03-24 ENCOUNTER — Encounter: Payer: Self-pay | Admitting: Physical Therapy

## 2021-03-24 NOTE — Therapy (Signed)
Pt arrived but did not pass COVID-19 screening questions.  Will reschedule initial evaluation.  No visit/charge today.     [facility] [address] [place], [place], [address] Phone: [phone]   Fax:  [phone]  Patient Details  Name: [name] [name] MRN: [id number] Date of Birth: Mar 19, 1966 Referring Provider:  [name], MD  Encounter Date: 03/24/2021   Baruch Merl, PT 03/24/21 8:53 AM   [facility] [address] [place], [place], [address] Phone: [phone]   Fax:  [phone]

## 2021-03-30 ENCOUNTER — Other Ambulatory Visit (HOSPITAL_COMMUNITY): Payer: Self-pay

## 2021-03-30 MED FILL — Mepolizumab Subcutaneous Solution Auto-injector 100 MG/ML: SUBCUTANEOUS | 28 days supply | Qty: 1 | Fill #2 | Status: AC

## 2021-04-01 ENCOUNTER — Other Ambulatory Visit (HOSPITAL_COMMUNITY): Payer: Self-pay

## 2021-04-05 ENCOUNTER — Ambulatory Visit: Payer: BC Managed Care – PPO | Attending: Specialist | Admitting: Physical Therapy

## 2021-04-12 DIAGNOSIS — F900 Attention-deficit hyperactivity disorder, predominantly inattentive type: Secondary | ICD-10-CM | POA: Diagnosis not present

## 2021-04-12 DIAGNOSIS — F4322 Adjustment disorder with anxiety: Secondary | ICD-10-CM | POA: Diagnosis not present

## 2021-04-12 DIAGNOSIS — F330 Major depressive disorder, recurrent, mild: Secondary | ICD-10-CM | POA: Diagnosis not present

## 2021-04-18 ENCOUNTER — Other Ambulatory Visit: Payer: Self-pay | Admitting: Specialist

## 2021-04-19 DIAGNOSIS — F4323 Adjustment disorder with mixed anxiety and depressed mood: Secondary | ICD-10-CM | POA: Diagnosis not present

## 2021-04-19 DIAGNOSIS — E785 Hyperlipidemia, unspecified: Secondary | ICD-10-CM | POA: Diagnosis not present

## 2021-04-19 DIAGNOSIS — R7301 Impaired fasting glucose: Secondary | ICD-10-CM | POA: Diagnosis not present

## 2021-04-19 DIAGNOSIS — F330 Major depressive disorder, recurrent, mild: Secondary | ICD-10-CM | POA: Diagnosis not present

## 2021-04-19 DIAGNOSIS — F900 Attention-deficit hyperactivity disorder, predominantly inattentive type: Secondary | ICD-10-CM | POA: Diagnosis not present

## 2021-04-23 ENCOUNTER — Other Ambulatory Visit (HOSPITAL_COMMUNITY): Payer: Self-pay

## 2021-04-26 ENCOUNTER — Other Ambulatory Visit (HOSPITAL_COMMUNITY): Payer: Self-pay

## 2021-04-26 ENCOUNTER — Ambulatory Visit: Payer: BC Managed Care – PPO | Admitting: Specialist

## 2021-04-26 DIAGNOSIS — I10 Essential (primary) hypertension: Secondary | ICD-10-CM | POA: Diagnosis not present

## 2021-04-26 DIAGNOSIS — R82998 Other abnormal findings in urine: Secondary | ICD-10-CM | POA: Diagnosis not present

## 2021-04-26 DIAGNOSIS — Z0000 Encounter for general adult medical examination without abnormal findings: Secondary | ICD-10-CM | POA: Diagnosis not present

## 2021-04-28 ENCOUNTER — Other Ambulatory Visit (HOSPITAL_COMMUNITY): Payer: Self-pay

## 2021-04-29 ENCOUNTER — Other Ambulatory Visit (HOSPITAL_COMMUNITY): Payer: Self-pay

## 2021-04-29 DIAGNOSIS — Z83511 Family history of glaucoma: Secondary | ICD-10-CM | POA: Diagnosis not present

## 2021-04-29 MED FILL — Mepolizumab Subcutaneous Solution Auto-injector 100 MG/ML: SUBCUTANEOUS | 28 days supply | Qty: 1 | Fill #3 | Status: AC

## 2021-05-06 DIAGNOSIS — F900 Attention-deficit hyperactivity disorder, predominantly inattentive type: Secondary | ICD-10-CM | POA: Diagnosis not present

## 2021-05-06 DIAGNOSIS — F4323 Adjustment disorder with mixed anxiety and depressed mood: Secondary | ICD-10-CM | POA: Diagnosis not present

## 2021-05-06 DIAGNOSIS — F330 Major depressive disorder, recurrent, mild: Secondary | ICD-10-CM | POA: Diagnosis not present

## 2021-05-20 ENCOUNTER — Other Ambulatory Visit: Payer: Self-pay | Admitting: Specialist

## 2021-05-24 ENCOUNTER — Other Ambulatory Visit (HOSPITAL_COMMUNITY): Payer: Self-pay

## 2021-05-26 ENCOUNTER — Other Ambulatory Visit (HOSPITAL_COMMUNITY): Payer: Self-pay

## 2021-05-26 MED FILL — Mepolizumab Subcutaneous Solution Auto-injector 100 MG/ML: SUBCUTANEOUS | 28 days supply | Qty: 1 | Fill #4 | Status: AC

## 2021-05-27 ENCOUNTER — Other Ambulatory Visit (HOSPITAL_COMMUNITY): Payer: Self-pay

## 2021-05-27 DIAGNOSIS — F900 Attention-deficit hyperactivity disorder, predominantly inattentive type: Secondary | ICD-10-CM | POA: Diagnosis not present

## 2021-05-27 DIAGNOSIS — F330 Major depressive disorder, recurrent, mild: Secondary | ICD-10-CM | POA: Diagnosis not present

## 2021-05-27 DIAGNOSIS — F4323 Adjustment disorder with mixed anxiety and depressed mood: Secondary | ICD-10-CM | POA: Diagnosis not present

## 2021-06-02 DIAGNOSIS — K5730 Diverticulosis of large intestine without perforation or abscess without bleeding: Secondary | ICD-10-CM | POA: Diagnosis not present

## 2021-06-02 DIAGNOSIS — Z1211 Encounter for screening for malignant neoplasm of colon: Secondary | ICD-10-CM | POA: Diagnosis not present

## 2021-06-03 DIAGNOSIS — Z23 Encounter for immunization: Secondary | ICD-10-CM | POA: Diagnosis not present

## 2021-06-03 DIAGNOSIS — D352 Benign neoplasm of pituitary gland: Secondary | ICD-10-CM | POA: Diagnosis not present

## 2021-06-03 DIAGNOSIS — E2749 Other adrenocortical insufficiency: Secondary | ICD-10-CM | POA: Diagnosis not present

## 2021-06-04 ENCOUNTER — Other Ambulatory Visit: Payer: Self-pay | Admitting: Endocrinology

## 2021-06-04 DIAGNOSIS — D352 Benign neoplasm of pituitary gland: Secondary | ICD-10-CM

## 2021-06-18 DIAGNOSIS — F330 Major depressive disorder, recurrent, mild: Secondary | ICD-10-CM | POA: Diagnosis not present

## 2021-06-18 DIAGNOSIS — F4323 Adjustment disorder with mixed anxiety and depressed mood: Secondary | ICD-10-CM | POA: Diagnosis not present

## 2021-06-18 DIAGNOSIS — F900 Attention-deficit hyperactivity disorder, predominantly inattentive type: Secondary | ICD-10-CM | POA: Diagnosis not present

## 2021-06-21 ENCOUNTER — Other Ambulatory Visit (HOSPITAL_COMMUNITY): Payer: Self-pay

## 2021-06-21 MED FILL — Mepolizumab Subcutaneous Solution Auto-injector 100 MG/ML: SUBCUTANEOUS | 28 days supply | Qty: 1 | Fill #5 | Status: AC

## 2021-06-22 ENCOUNTER — Other Ambulatory Visit: Payer: Self-pay | Admitting: Specialist

## 2021-06-24 ENCOUNTER — Ambulatory Visit
Admission: RE | Admit: 2021-06-24 | Discharge: 2021-06-24 | Disposition: A | Discharge status: Home / Self Care | Payer: BC Managed Care – PPO | Source: Ambulatory Visit | Attending: Endocrinology | Admitting: Endocrinology

## 2021-06-24 ENCOUNTER — Other Ambulatory Visit (HOSPITAL_COMMUNITY): Payer: Self-pay

## 2021-06-24 ENCOUNTER — Other Ambulatory Visit: Payer: Self-pay

## 2021-06-24 DIAGNOSIS — D352 Benign neoplasm of pituitary gland: Secondary | ICD-10-CM

## 2021-06-24 MED ORDER — GADOBENATE DIMEGLUMINE 529 MG/ML IV SOLN
20.0000 mL | Freq: Once | INTRAVENOUS | Status: AC | PRN
  Administered 2021-06-24: 20 mL via INTRAVENOUS

## 2021-06-29 ENCOUNTER — Other Ambulatory Visit: Payer: Self-pay | Admitting: Gastroenterology

## 2021-06-29 DIAGNOSIS — R933 Abnormal findings on diagnostic imaging of other parts of digestive tract: Secondary | ICD-10-CM

## 2021-07-14 ENCOUNTER — Ambulatory Visit
Admission: RE | Admit: 2021-07-14 | Discharge: 2021-07-14 | Disposition: A | Discharge status: Home / Self Care | Payer: BC Managed Care – PPO | Source: Ambulatory Visit | Attending: Gastroenterology | Admitting: Gastroenterology

## 2021-07-14 DIAGNOSIS — K429 Umbilical hernia without obstruction or gangrene: Secondary | ICD-10-CM | POA: Diagnosis not present

## 2021-07-14 DIAGNOSIS — I700 Atherosclerosis of aorta: Secondary | ICD-10-CM | POA: Diagnosis not present

## 2021-07-14 DIAGNOSIS — R933 Abnormal findings on diagnostic imaging of other parts of digestive tract: Secondary | ICD-10-CM

## 2021-07-14 DIAGNOSIS — K5730 Diverticulosis of large intestine without perforation or abscess without bleeding: Secondary | ICD-10-CM | POA: Diagnosis not present

## 2021-07-14 MED ORDER — IOPAMIDOL (ISOVUE-300) INJECTION 61%
100.0000 mL | Freq: Once | INTRAVENOUS | Status: AC | PRN
  Administered 2021-07-14: 100 mL via INTRAVENOUS

## 2021-07-19 ENCOUNTER — Other Ambulatory Visit (HOSPITAL_COMMUNITY): Payer: Self-pay

## 2021-07-21 ENCOUNTER — Other Ambulatory Visit: Payer: Self-pay | Admitting: Specialist

## 2021-07-21 DIAGNOSIS — M4326 Fusion of spine, lumbar region: Secondary | ICD-10-CM

## 2021-07-22 ENCOUNTER — Other Ambulatory Visit (HOSPITAL_COMMUNITY): Payer: Self-pay

## 2021-07-30 ENCOUNTER — Telehealth: Payer: Self-pay | Admitting: Pharmacist

## 2021-07-30 NOTE — Telephone Encounter (Signed)
Patient last seen by Dr. Loanne Drilling on 10/19/20. Was no-show to appt in April 2022.  She needs f/u appt - taking Nucala biologic for asthma.  Dr. Loanne Drilling would like q 6 month f/u for taking biologics. Routing to triage to have patietn scheduled in November or December  Knox Saliva, PharmD, MPH, BCPS Clinical Pharmacist (Rheumatology and Pulmonology)

## 2021-07-31 NOTE — Telephone Encounter (Signed)
I have called the pt and LM on VM for her to call the office to get the appt schedule.

## 2021-08-09 DIAGNOSIS — R768 Other specified abnormal immunological findings in serum: Secondary | ICD-10-CM | POA: Diagnosis not present

## 2021-08-09 DIAGNOSIS — M130 Polyarthritis, unspecified: Secondary | ICD-10-CM | POA: Diagnosis not present

## 2021-08-09 DIAGNOSIS — M7989 Other specified soft tissue disorders: Secondary | ICD-10-CM | POA: Diagnosis not present

## 2021-08-09 DIAGNOSIS — M150 Primary generalized (osteo)arthritis: Secondary | ICD-10-CM | POA: Diagnosis not present

## 2021-08-10 ENCOUNTER — Encounter: Payer: Self-pay | Admitting: Pulmonary Disease

## 2021-08-10 ENCOUNTER — Other Ambulatory Visit: Payer: Self-pay

## 2021-08-10 ENCOUNTER — Ambulatory Visit (INDEPENDENT_AMBULATORY_CARE_PROVIDER_SITE_OTHER): Payer: BC Managed Care – PPO | Admitting: Pulmonary Disease

## 2021-08-10 DIAGNOSIS — Z7952 Long term (current) use of systemic steroids: Secondary | ICD-10-CM

## 2021-08-10 DIAGNOSIS — R0609 Other forms of dyspnea: Secondary | ICD-10-CM | POA: Diagnosis not present

## 2021-08-10 DIAGNOSIS — J42 Unspecified chronic bronchitis: Secondary | ICD-10-CM

## 2021-08-10 DIAGNOSIS — J4550 Severe persistent asthma, uncomplicated: Secondary | ICD-10-CM | POA: Diagnosis not present

## 2021-08-10 MED ORDER — MONTELUKAST SODIUM 10 MG PO TABS: ORAL_TABLET | ORAL | 3 refills | Status: DC

## 2021-08-10 MED ORDER — PREDNISONE 10 MG PO TABS: ORAL_TABLET | ORAL | 0 refills | Status: DC

## 2021-08-10 MED ORDER — PSEUDOEPH-BROMPHEN-DM 30-2-10 MG/5ML PO SYRP: 5.0000 mL | ORAL_SOLUTION | Freq: Four times a day (QID) | ORAL | 0 refills | Status: DC | PRN

## 2021-08-10 MED ORDER — PROAIR HFA 108 (90 BASE) MCG/ACT IN AERS: 2.0000 | INHALATION_SPRAY | Freq: Four times a day (QID) | RESPIRATORY_TRACT | 5 refills | Status: DC | PRN

## 2021-08-10 NOTE — Patient Instructions (Addendum)
Severe Persistent Eosinophilic Asthma - no longer steroid dependent Asthma Exacerbation --START prednisone taper --CONTINUE Nucala --CONTINUE Symbicort TWO puffs TWICE a day --CONTINUE Albuterol as needed for shortness of breath or wheezing  Follow-up with me in 1 month

## 2021-08-10 NOTE — Progress Notes (Signed)
Synopsis:  Referred in 06/2018 for chronic productive cough and shortness of breath. Methacholine challenge 07/2018 consistent with asthma  Subjective:   PATIENT ID: [name] [name] GENDER: female DOB: 15-Oct-1965, MRN: [id number]   HPI  Chief Complaint  Patient presents with   Follow-up    Nucala f/u   [name] is a 55 year old female with severe persistent asthma, adrenal insufficiency, history of pituitary adenoma status post resection complicated by nasal septal necrosis who presents for follow-up  Synopsis: Confirm diagnosis of asthma on 07/2018 methacholine challenge. Started on [place] on 03/16/2019.  Overall well-controlled since initiation of biologic.  She previously had 3 exacerbations a year.  Now seems to only occur at most once a year.  At baseline she has a minimal productive cough asthma triggered by allergies.  Interval Since her last visit on 09/2020, she reports that her asthma is overall well-controlled. She has been compliant with  Fall time is her worse time with watery eyes, congestion, rhinorrhea and productive cough. Her recent symptoms have been lasting two weeks in October/November. She had tried albuterol and noticed SpO2 86% until she used nebulizer treatment which helped in come up 92%. She did not seek medication treatment. Has been using Symbicort and Albuterol >4 times a day.  2019 Jan Feb Mar April May June July Aug Sept Oct Nov Dec            X X X  4580 Jan Feb Mar April May June July Aug Sept Oct Nov Dec   X    X       X   2021 Jan Feb Mar April May June July Aug Sept Oct Nov Dec                2022 Jan Feb Mar April May June July Aug Sept Oct Nov Dec   X          X   2023 Jan Feb Mar April May June July Aug Sept Oct Nov Dec                 Asthma Control Test ACT Total Score  07/08/2019 19   Social History: Non-smoker She has had recent stressors.  Her husband has been diagnosed with Parkinson.  Her father passed this winter in  2021.  Past Medical History:  Diagnosis Date   Adrenal gland hypofunction (HCC)    related to post op, vancomycin & lumbar injection, steroids     Arthritis    hnp-lumbar    Blood dyscrasia    hx probable blood clot after ankle surgery  not definitive but treated per pt   Cancer ([place])    "early evolvng melanoma "- legs    Depression    Diabetes insipidus ([facility])    Family history of adverse reaction to anesthesia    mom has N/V - zofran works   Heart murmur    years ago-echo 05 no problems   High cholesterol    History of blood transfusion    "16 so far" (07/25/2012)   History of bronchitis    History of UTI    Hypertension    no med for 1 yr   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 dvt 2011 tx as preventive although did not show on scan after ankle surgery   Pneumonia ~ 2009; 2010   PONV (postoperative nausea and vomiting)    last  2 surgeries less nausea   PONV (postoperative nausea and vomiting)    none with last surgery-be extra careful with nose patient haas septal hole      Allergies  Allergen Reactions   Amoxicillin Anaphylaxis, Hives and Itching   Black Walnut Pollen Allergy Skin Test Anaphylaxis   Penicillins Anaphylaxis    Has patient had a PCN reaction causing immediate rash, facial/tongue/throat swelling, SOB or lightheadedness with hypotension: Yes Has patient had a PCN reaction causing severe rash involving mucus membranes or skin necrosis: No Has patient had a PCN reaction that required hospitalization Yes Has patient had a PCN reaction occurring within the last 10 years: No If all of the above answers are "NO", then may proceed with Cephalosporin use.    Sulfa Antibiotics Hives and Other (See Comments)    Hives, mouth blisters    Chloroquine Other (See Comments)    Renal dysfunction   Ciprofloxacin Hives   Phenergan [Promethazine Hcl] Other (See Comments)    Caused "severe drop in blood pressure."    Sulfamethoxazole Hives   Other Other (See Comments)    From allergy testing--Canteloupe   Shellfish Allergy Itching and Rash   Vancomycin Cross Reactors Other (See Comments)    "RED MAN SYNDROME"  Pt stated she had no reaction to Vancomycin when it is given slow     Outpatient Medications Prior to Visit  Medication Sig Dispense Refill   ADDERALL XR 20 MG 24 hr capsule Take 20 mg by mouth daily as needed (concentration).  0   budesonide-formoterol (SYMBICORT) 160-4.5 MCG/ACT inhaler Inhale 2 puffs into the lungs 2 (two) times daily. 1 Inhaler 6   EPINEPHrine 0.3 mg/0.3 mL IJ SOAJ injection Inject 0.3 mg into the muscle as needed for anaphylaxis. 0.3 mL 2   estradiol (CLIMARA - DOSED IN MG/24 HR) 0.0375 mg/24hr patch Place 0.0375 mg onto the skin once a week. saturdays     ipratropium (ATROVENT) 0.02 % nebulizer solution Take 2.5 mLs (0.5 mg total) by nebulization every 6 (six) hours as needed for wheezing or shortness of breath. icd- J42 75 mL 12   Mepolizumab 100 MG/ML SOAJ INJECT 1 ML (100 MG TOTAL) INTO THE SKIN EVERY 28 (TWENTY-EIGHT) DAYS. DELIVER TO PATIENT'S HOME FOR SELF-ADMINISTRATION 1 mL 5   OVER THE COUNTER MEDICATION Take 6 tablets by mouth daily. Smarty Pants Vitamins     Probiotic CAPS Take 1 capsule by mouth daily.      Respiratory Therapy Supplies (FLUTTER) DEVI Use flutter valve 3 times a day 1 each 0   rosuvastatin (CRESTOR) 20 MG tablet Take 20 mg by mouth daily.     sertraline (ZOLOFT) 50 MG tablet Take 150 mg by mouth daily.     telmisartan (MICARDIS) 40 MG tablet Take 40 mg by mouth daily.     topiramate (TOPAMAX) 50 MG tablet Take 150 mg by mouth daily.     valACYclovir (VALTREX) 1000 MG tablet Take two tablets as a single dose prn cold sore. (Patient taking differently: Take 2,000 mg by mouth daily as needed. Take two tablets as a single dose prn cold sore.) 20 tablet 1   montelukast (SINGULAIR) 10 MG tablet TAKE 1 TABLET BY MOUTH EVERYDAY AT BEDTIME 90 tablet 3    PROAIR HFA 108 (90 Base) MCG/ACT inhaler Inhale 2 puffs into the lungs every 6 (six) hours as needed for wheezing or shortness of breath. 8 g 5   desmopressin (DDAVP) 0.1 MG tablet Take 0.1 mg by mouth 2 (  two) times daily. (Patient not taking: Reported on 08/10/2021)     etodolac (LODINE) 400 MG tablet Take 1 tablet (400 mg total) by mouth 2 (two) times daily. (Patient not taking: Reported on 08/10/2021) 60 tablet 3   pregabalin (LYRICA) 50 MG capsule TAKE ONE CAPSULE BY MOUTH TWICE A DAY (Patient not taking: Reported on 08/10/2021) 60 capsule 0   Facility-Administered Medications Prior to Visit  Medication Dose Route Frequency Provider Last Rate Last Admin   Mepolizumab SOLR 100 mg  100 mg Subcutaneous Q28 days Margaretha Seeds, MD   100 mg at 04/16/19 1022   Mepolizumab SOLR 100 mg  100 mg Subcutaneous Q28 days Margaretha Seeds, MD   100 mg at 05/13/19 6203   Mepolizumab SOLR 100 mg  100 mg Subcutaneous Q28 days Margaretha Seeds, MD   100 mg at 06/10/19 5597   Mepolizumab SOLR 100 mg  100 mg Subcutaneous Q28 days Margaretha Seeds, MD   100 mg at 08/05/19 1001   Mepolizumab SOLR 100 mg  100 mg Subcutaneous Q28 days Margaretha Seeds, MD   100 mg at 09/02/19 4163   Mepolizumab SOLR 100 mg  100 mg Subcutaneous Q28 days Margaretha Seeds, MD   100 mg at 09/30/19 1141   Mepolizumab SOLR 100 mg  100 mg Subcutaneous Q28 days Margaretha Seeds, MD   100 mg at 11/25/19 1215   Mepolizumab SOLR 100 mg  100 mg Subcutaneous Q28 days Margaretha Seeds, MD   100 mg at 12/23/19 1137   Mepolizumab SOLR 100 mg  100 mg Subcutaneous Q28 days Margaretha Seeds, MD   100 mg at 03/02/20 1001   Mepolizumab SOLR 100 mg  100 mg Subcutaneous Q28 days Margaretha Seeds, MD   100 mg at 04/06/20 8453   Mepolizumab SOLR 100 mg  100 mg Subcutaneous Q28 days Margaretha Seeds, MD   100 mg at 05/04/20 6468    Review of Systems  Constitutional:  Negative for chills, diaphoresis, fever, malaise/fatigue and weight loss.   HENT:  Positive for congestion.   Respiratory:  Positive for cough and shortness of breath. Negative for hemoptysis, sputum production and wheezing.   Cardiovascular:  Negative for chest pain, palpitations and leg swelling.    Objective:   Vitals:   08/10/21 0928  BP: 120/88  Pulse: 99  Temp: 98.4 F (36.9 C)  TempSrc: Oral  SpO2: 98%  Weight: 230 lb 9.6 oz (104.6 kg)  Height: 5\' 9"  (1.753 m)   SpO2: 98 % O2 Device: None (Room air)  Physical Exam: General: Well-appearing, no acute distress HENT: [place], AT Eyes: EOMI, no scleral icterus Respiratory: Diminished breath sounds, expiratory wheeze scattered Cardiovascular: RRR, -M/R/G, no JVD Extremities:-Edema,-tenderness Neuro: AAO x4, CNII-XII grossly intact Psych: Normal mood, normal affect  Labs Eosinophil 252 (3.4%) 09/25/18  Chest imaging: CXR 09/25/2018-normal chest x-ray with clear lung fields without evidence of infiltrate, effusion or edema CXR 10/16/2020-no infiltrate or effusion or edema. CT abdomen/pelvis 07/14/2021-lower lung fields with no parenchymal abnormalities, effusion or edema.  Diverticulosis, small umbilical hernia.  PFT:  08/07/2018-Mild bronchial hyper reactivity suggestive of asthma    Assessment & Plan:  55 year old female with severe persistent asthma who presents for follow-up.  Overall has been well controlled on Nucala since initiation on 02/19/2019.  Last asthma exacerbation was on 09/2020. She has had a total of 2 exacerbation in the last 24 months. Currently in exacerbation  Severe Persistent Eosinophilic Asthma - no longer steroid  dependent Asthma Exacerbation --START prednisone taper --CONTINUE Nucala --CONTINUE Symbicort TWO puffs TWICE a day --CONTINUE Albuterol as needed for shortness of breath or wheezing --Cough syrup ordered  Asthma Action Plan IF you develop asthma symptoms (chest tightness, cough and wheezing), take an extra 2 puffs of Symbicort or Albuterol as needed every 4  hours. Call us for persistent symptoms steroids.  Immunization History  Administered Date(s) Administered   Influenza,inj,Quad PF,6+ Mos 07/01/2016, 07/14/2017, 05/27/2019, 05/27/2020   PFIZER(Purple Top)SARS-COV-2 Vaccination 12/12/2019, 01/06/2020, 07/21/2020   Tdap 07/14/2014   No orders of the defined types were placed in this encounter.  Meds ordered this encounter  Medications   montelukast (SINGULAIR) 10 MG tablet    Sig: TAKE 1 TABLET BY MOUTH EVERYDAY AT BEDTIME    Dispense:  90 tablet    Refill:  3   PROAIR HFA 108 (90 Base) MCG/ACT inhaler    Sig: Inhale 2 puffs into the lungs every 6 (six) hours as needed for wheezing or shortness of breath.    Dispense:  8 g    Refill:  5   predniSONE (DELTASONE) 10 MG tablet    Sig: Take 6 tablets x three days (60 mg), followed by 5 tablets x three days (50mg ), then 4 tablets x three day(40mg ), then 3 tablets (30mg ) x three days, then 2 tablets (20mg ) x three days, then 1 tablet (10mg ) x three days, then STOP    Dispense:  60 tablet    Refill:  0   brompheniramine-pseudoephedrine-DM 30-2-10 MG/5ML syrup    Sig: Take 5 mLs by mouth 4 (four) times daily as needed.    Dispense:  473 mL    Refill:  0    Return in about 1 month (around 09/09/2021).  I have spent a total time of 32-minutes on the day of the appointment reviewing prior documentation, coordinating care and discussing medical diagnosis and plan with the patient/family. Past medical history, allergies, medications were reviewed. Pertinent imaging, labs and tests included in this note have been reviewed and interpreted independently by me.   Falicity Sheets Rodman Pickle, MD [facility] 08/10/2021 9:52 AM

## 2021-08-12 ENCOUNTER — Encounter: Payer: Self-pay | Admitting: Pulmonary Disease

## 2021-08-16 DIAGNOSIS — F4323 Adjustment disorder with mixed anxiety and depressed mood: Secondary | ICD-10-CM | POA: Diagnosis not present

## 2021-08-16 DIAGNOSIS — F330 Major depressive disorder, recurrent, mild: Secondary | ICD-10-CM | POA: Diagnosis not present

## 2021-08-16 DIAGNOSIS — F900 Attention-deficit hyperactivity disorder, predominantly inattentive type: Secondary | ICD-10-CM | POA: Diagnosis not present

## 2021-08-20 ENCOUNTER — Other Ambulatory Visit: Payer: Self-pay | Admitting: Specialist

## 2021-08-20 DIAGNOSIS — M4326 Fusion of spine, lumbar region: Secondary | ICD-10-CM

## 2021-09-02 ENCOUNTER — Ambulatory Visit: Payer: BC Managed Care – PPO | Admitting: Pulmonary Disease

## 2021-09-06 DIAGNOSIS — F4323 Adjustment disorder with mixed anxiety and depressed mood: Secondary | ICD-10-CM | POA: Diagnosis not present

## 2021-09-06 DIAGNOSIS — F900 Attention-deficit hyperactivity disorder, predominantly inattentive type: Secondary | ICD-10-CM | POA: Diagnosis not present

## 2021-09-06 DIAGNOSIS — Z01419 Encounter for gynecological examination (general) (routine) without abnormal findings: Secondary | ICD-10-CM | POA: Diagnosis not present

## 2021-09-06 DIAGNOSIS — F330 Major depressive disorder, recurrent, mild: Secondary | ICD-10-CM | POA: Diagnosis not present

## 2021-09-20 ENCOUNTER — Other Ambulatory Visit: Payer: Self-pay | Admitting: Specialist

## 2021-09-20 DIAGNOSIS — M4326 Fusion of spine, lumbar region: Secondary | ICD-10-CM

## 2021-09-28 ENCOUNTER — Other Ambulatory Visit (HOSPITAL_COMMUNITY): Payer: Self-pay

## 2021-10-01 ENCOUNTER — Telehealth: Payer: Self-pay | Admitting: Pharmacist

## 2021-10-01 ENCOUNTER — Other Ambulatory Visit (HOSPITAL_COMMUNITY): Payer: Self-pay

## 2021-10-01 ENCOUNTER — Other Ambulatory Visit: Payer: Self-pay | Admitting: Pharmacist

## 2021-10-01 DIAGNOSIS — J4550 Severe persistent asthma, uncomplicated: Secondary | ICD-10-CM

## 2021-10-01 DIAGNOSIS — Z7952 Long term (current) use of systemic steroids: Secondary | ICD-10-CM

## 2021-10-01 MED ORDER — MEPOLIZUMAB 100 MG/ML ~~LOC~~ SOAJ
SUBCUTANEOUS | 5 refills | Status: DC
  Filled 2021-10-01: qty 1, fill #0
  Filled 2021-12-01: qty 1, 28d supply, fill #0
  Filled 2021-12-20: qty 1, 28d supply, fill #1
  Filled 2022-01-13: qty 1, 28d supply, fill #2
  Filled 2022-02-10: qty 1, 28d supply, fill #3
  Filled 2022-03-11: qty 1, 28d supply, fill #4
  Filled 2022-04-25: qty 1, 28d supply, fill #5

## 2021-10-01 NOTE — Telephone Encounter (Signed)
Refill sent for [facility] to [place]: [phone]   Dose: 100 mg SQ every 4 weeks  Last OV: 08/10/21 Provider: Dr. Loanne Drilling  Next OV: no show on 09/02/21  Knox Saliva, PharmD, MPH, BCPS Clinical Pharmacist (Rheumatology and Pulmonology)

## 2021-10-01 NOTE — Telephone Encounter (Signed)
Error

## 2021-10-06 DIAGNOSIS — D352 Benign neoplasm of pituitary gland: Secondary | ICD-10-CM | POA: Diagnosis not present

## 2021-10-06 DIAGNOSIS — R7301 Impaired fasting glucose: Secondary | ICD-10-CM | POA: Diagnosis not present

## 2021-10-08 DIAGNOSIS — F900 Attention-deficit hyperactivity disorder, predominantly inattentive type: Secondary | ICD-10-CM | POA: Diagnosis not present

## 2021-10-08 DIAGNOSIS — F4323 Adjustment disorder with mixed anxiety and depressed mood: Secondary | ICD-10-CM | POA: Diagnosis not present

## 2021-10-08 DIAGNOSIS — F330 Major depressive disorder, recurrent, mild: Secondary | ICD-10-CM | POA: Diagnosis not present

## 2021-10-24 ENCOUNTER — Other Ambulatory Visit: Payer: Self-pay | Admitting: Specialist

## 2021-10-24 DIAGNOSIS — M4326 Fusion of spine, lumbar region: Secondary | ICD-10-CM

## 2021-11-08 DIAGNOSIS — F330 Major depressive disorder, recurrent, mild: Secondary | ICD-10-CM | POA: Diagnosis not present

## 2021-11-08 DIAGNOSIS — F900 Attention-deficit hyperactivity disorder, predominantly inattentive type: Secondary | ICD-10-CM | POA: Diagnosis not present

## 2021-11-08 DIAGNOSIS — F4323 Adjustment disorder with mixed anxiety and depressed mood: Secondary | ICD-10-CM | POA: Diagnosis not present

## 2021-11-22 ENCOUNTER — Other Ambulatory Visit: Payer: Self-pay | Admitting: Specialist

## 2021-11-22 DIAGNOSIS — M4326 Fusion of spine, lumbar region: Secondary | ICD-10-CM

## 2021-11-25 ENCOUNTER — Telehealth: Payer: Self-pay | Admitting: Pharmacist

## 2021-11-25 ENCOUNTER — Other Ambulatory Visit: Payer: Self-pay

## 2021-11-25 ENCOUNTER — Ambulatory Visit: Payer: BC Managed Care – PPO | Admitting: Pulmonary Disease

## 2021-11-25 ENCOUNTER — Encounter: Payer: Self-pay | Admitting: Pulmonary Disease

## 2021-11-25 ENCOUNTER — Other Ambulatory Visit (HOSPITAL_COMMUNITY): Payer: Self-pay

## 2021-11-25 VITALS — BP 126/84 | HR 90 | Ht 69.0 in | Wt 239.0 lb

## 2021-11-25 DIAGNOSIS — J4551 Severe persistent asthma with (acute) exacerbation: Secondary | ICD-10-CM | POA: Diagnosis not present

## 2021-11-25 NOTE — Progress Notes (Signed)
Synopsis: Referred in March 2023 for acute asthma exacerbation, followed by Dr. Loanne Drilling  Subjective:   PATIENT ID: [name] [name] GENDER: female DOB: 04/08/1966, MRN: [id number]   HPI  Chief Complaint  Patient presents with   Acute Visit    Possible asthma flare up   [name] [name] is a 56 year old woman, never smoker with history of pituitary adenoma s/p resection and adrenal insufficiency who returns to pulmonary clinic for severe persistent asthma on nucala.  Patient is followed by Dr. Loanne Drilling.  She has been on Nucala and Symbicort 160-4.5 mcg 2 puffs twice daily.  She reports not having a dose of Nucala since last fall and is unsure why the medicine has not been delivered to her home.  She reports increasing shortness of breath, more frequent wheezing and chest tightness.  Last night she developed acute onset of shortness of breath that was severe and felt like she was unable to to catch her breath.  She also had headache, watery eyes and scratchy throat.  Despite using her Symbicort inhaler and the albuterol inhaler 7-8 times she did not find much relief.  She then took 60 to 80 mg of prednisone which then started to ease her symptoms.  She takes Singulair on a regular basis.  She also recently started taking Zyrtec for the onset of allergy symptoms.  She is experiencing sinus congestion and drainage.  Past Medical History:  Diagnosis Date   Adrenal gland hypofunction (HCC)    related to post op, vancomycin & lumbar injection, steroids     Arthritis    hnp-lumbar    Blood dyscrasia    hx probable blood clot after ankle surgery  not definitive but treated per pt   Cancer ([place])    "early evolvng melanoma "- legs    Depression    Diabetes insipidus (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 dvt 2011 tx as preventive although did not show on scan after ankle surgery   Pneumonia ~ 2009; 2010   PONV (postoperative nausea and vomiting)    last 2 surgeries less nausea   PONV (postoperative nausea and vomiting)    none with last surgery-be extra careful with nose patient haas septal hole      Family History  Problem Relation Age of Onset   Hypertension Mother    Coronary artery disease Mother    Hypertension Father    Lung cancer Father      Social History   Socioeconomic History   Marital status: Married    Spouse name: Not on file   Number of children: Not on file   Years of education: Not on file   Highest education level: Not on file  Occupational History   Not on file  Tobacco Use   Smoking status: Never   Smokeless tobacco: Never  Vaping Use   Vaping Use: Never used  Substance and Sexual Activity   Alcohol use: Yes    Alcohol/week: 2.0 standard drinks  Types: 2 Glasses of wine per week    Comment: occasional   Drug use: No   Sexual activity: Yes  Other Topics Concern   Not on file  Social History Narrative   Not on file   Social Determinants of Health   Financial Resource Strain: Not on file  Food Insecurity: Not on file  Transportation Needs: Not on file  Physical Activity: Not on file  Stress: Not on file  Social Connections: Not on file  Intimate Partner Violence: Not on file     Allergies  Allergen Reactions   Amoxicillin Anaphylaxis, Hives and Itching   Black Walnut Pollen Allergy Skin Test Anaphylaxis   Penicillins Anaphylaxis    Has patient had a PCN reaction causing immediate rash, facial/tongue/throat swelling, SOB or lightheadedness with hypotension: Yes Has patient had a PCN reaction causing severe rash involving mucus membranes or skin necrosis: No Has patient had a PCN  reaction that required hospitalization Yes Has patient had a PCN reaction occurring within the last 10 years: No If all of the above answers are "NO", then may proceed with Cephalosporin use.    Sulfa Antibiotics Hives and Other (See Comments)    Hives, mouth blisters    Chloroquine Other (See Comments)    Renal dysfunction   Ciprofloxacin Hives   Phenergan [Promethazine Hcl] Other (See Comments)    Caused "severe drop in blood pressure."   Sulfamethoxazole Hives   Other Other (See Comments)    From allergy testing--Canteloupe   Shellfish Allergy Itching and Rash   Vancomycin Cross Reactors Other (See Comments)    "RED MAN SYNDROME"  Pt stated she had no reaction to Vancomycin when it is given slow     Outpatient Medications Prior to Visit  Medication Sig Dispense Refill   ADDERALL XR 20 MG 24 hr capsule Take 20 mg by mouth daily as needed (concentration).  0   brompheniramine-pseudoephedrine-DM 30-2-10 MG/5ML syrup Take 5 mLs by mouth 4 (four) times daily as needed. 473 mL 0   budesonide-formoterol (SYMBICORT) 160-4.5 MCG/ACT inhaler Inhale 2 puffs into the lungs 2 (two) times daily. 1 Inhaler 6   EPINEPHrine 0.3 mg/0.3 mL IJ SOAJ injection Inject 0.3 mg into the muscle as needed for anaphylaxis. 0.3 mL 2   estradiol (CLIMARA - DOSED IN MG/24 HR) 0.0375 mg/24hr patch Place 0.0375 mg onto the skin once a week. saturdays     ipratropium (ATROVENT) 0.02 % nebulizer solution Take 2.5 mLs (0.5 mg total) by nebulization every 6 (six) hours as needed for wheezing or shortness of breath. icd- J42 75 mL 12   Mepolizumab 100 MG/ML SOAJ INJECT 1 ML (100 MG TOTAL) INTO THE SKIN EVERY 28 (TWENTY-EIGHT) DAYS. DELIVER TO PATIENT'S HOME FOR SELF-ADMINISTRATION 1 mL 5   montelukast (SINGULAIR) 10 MG tablet TAKE 1 TABLET BY MOUTH EVERYDAY AT BEDTIME 90 tablet 3   OVER THE COUNTER MEDICATION Take 6 tablets by mouth daily. Smarty Pants Vitamins     PROAIR HFA 108 (90 Base) MCG/ACT inhaler Inhale 2 puffs  into the lungs every 6 (six) hours as needed for wheezing or shortness of breath. 8 g 5   Probiotic CAPS Take 1 capsule by mouth daily.      Respiratory Therapy Supplies (FLUTTER) DEVI Use flutter valve 3 times a day 1 each 0   rosuvastatin (CRESTOR) 20 MG tablet Take 20 mg by mouth daily.     sertraline (ZOLOFT) 50 MG tablet Take 150 mg by mouth daily.  telmisartan (MICARDIS) 40 MG tablet Take 40 mg by mouth daily.     topiramate (TOPAMAX) 50 MG tablet Take 150 mg by mouth daily.     valACYclovir (VALTREX) 1000 MG tablet Take two tablets as a single dose prn cold sore. (Patient taking differently: Take 2,000 mg by mouth daily as needed. Take two tablets as a single dose prn cold sore.) 20 tablet 1   desmopressin (DDAVP) 0.1 MG tablet Take 0.1 mg by mouth 2 (two) times daily. (Patient not taking: Reported on 08/10/2021)     etodolac (LODINE) 400 MG tablet Take 1 tablet (400 mg total) by mouth 2 (two) times daily. (Patient not taking: Reported on 08/10/2021) 60 tablet 3   predniSONE (DELTASONE) 10 MG tablet Take 6 tablets x three days (60 mg), followed by 5 tablets x three days (50mg ), then 4 tablets x three day(40mg ), then 3 tablets (30mg ) x three days, then 2 tablets (20mg ) x three days, then 1 tablet (10mg ) x three days, then STOP 60 tablet 0   pregabalin (LYRICA) 50 MG capsule TAKE ONE CAPSULE BY MOUTH TWICE A DAY 60 capsule 0   Facility-Administered Medications Prior to Visit  Medication Dose Route Frequency Provider Last Rate Last Admin   Mepolizumab SOLR 100 mg  100 mg Subcutaneous Q28 days Margaretha Seeds, MD   100 mg at 04/16/19 1022   Mepolizumab SOLR 100 mg  100 mg Subcutaneous Q28 days Margaretha Seeds, MD   100 mg at 05/13/19 1660   Mepolizumab SOLR 100 mg  100 mg Subcutaneous Q28 days Margaretha Seeds, MD   100 mg at 06/10/19 6301   Mepolizumab SOLR 100 mg  100 mg Subcutaneous Q28 days Margaretha Seeds, MD   100 mg at 08/05/19 1001   Mepolizumab SOLR 100 mg  100 mg  Subcutaneous Q28 days Margaretha Seeds, MD   100 mg at 09/02/19 0954   Mepolizumab SOLR 100 mg  100 mg Subcutaneous Q28 days Margaretha Seeds, MD   100 mg at 09/30/19 1141   Mepolizumab SOLR 100 mg  100 mg Subcutaneous Q28 days Margaretha Seeds, MD   100 mg at 11/25/19 1215   Mepolizumab SOLR 100 mg  100 mg Subcutaneous Q28 days Margaretha Seeds, MD   100 mg at 12/23/19 1137   Mepolizumab SOLR 100 mg  100 mg Subcutaneous Q28 days Margaretha Seeds, MD   100 mg at 03/02/20 1001   Mepolizumab SOLR 100 mg  100 mg Subcutaneous Q28 days Margaretha Seeds, MD   100 mg at 04/06/20 6010   Mepolizumab SOLR 100 mg  100 mg Subcutaneous Q28 days Margaretha Seeds, MD   100 mg at 05/04/20 9323   Review of Systems  Constitutional:  Negative for chills, fever, malaise/fatigue and weight loss.  HENT:  Positive for congestion. Negative for sinus pain and sore throat.   Eyes: Negative.   Respiratory:  Positive for cough, sputum production, shortness of breath and wheezing. Negative for hemoptysis.   Cardiovascular:  Negative for chest pain, palpitations, orthopnea, claudication and leg swelling.  Gastrointestinal:  Negative for abdominal pain, heartburn, nausea and vomiting.  Genitourinary: Negative.   Musculoskeletal:  Negative for joint pain and myalgias.  Skin:  Negative for rash.  Neurological:  Positive for headaches. Negative for weakness.  Endo/Heme/Allergies:  Positive for environmental allergies.  Psychiatric/Behavioral: Negative.     Objective:   Vitals:   11/25/21 1128  BP: 126/84  Pulse: 90  SpO2: 100%  Weight: 239 lb (108.4 kg)  Height: 5\' 9"  (1.753 m)   Physical Exam Constitutional:      General: She is not in acute distress.    Appearance: She is not ill-appearing.  HENT:     Head: Normocephalic and atraumatic.  Eyes:     General: No scleral icterus.    Conjunctiva/sclera: Conjunctivae normal.     Pupils: Pupils are equal, round, and reactive to light.  Cardiovascular:      Rate and Rhythm: Normal rate and regular rhythm.     Pulses: Normal pulses.     Heart sounds: Normal heart sounds. No murmur heard. Pulmonary:     Effort: Pulmonary effort is normal.     Breath sounds: Normal breath sounds. No wheezing, rhonchi or rales.  Abdominal:     General: Bowel sounds are normal.     Palpations: Abdomen is soft.  Musculoskeletal:     Right lower leg: No edema.     Left lower leg: No edema.  Lymphadenopathy:     Cervical: No cervical adenopathy.  Skin:    General: Skin is warm and dry.  Neurological:     General: No focal deficit present.     Mental Status: She is alert.  Psychiatric:        Mood and Affect: Mood normal.        Behavior: Behavior normal.        Thought Content: Thought content normal.        Judgment: Judgment normal.   CBC    Component Value Date/Time   WBC 7.4 09/25/2018 1156   RBC 4.45 09/25/2018 1156   HGB 14.5 09/25/2018 1156   HCT 42.0 09/25/2018 1156   PLT 310 09/25/2018 1156   MCV 94.4 09/25/2018 1156   MCH 32.6 09/25/2018 1156   MCHC 34.5 09/25/2018 1156   RDW 12.0 09/25/2018 1156   LYMPHSABS 3,352 09/25/2018 1156   MONOABS 0.8 12/07/2016 0644   EOSABS 252 09/25/2018 1156   BASOSABS 22 09/25/2018 1156   Chest imaging:  PFT: PFT Results Latest Ref Rng & Units 08/07/2018 07/25/2018  FVC-Pre L 3.53 3.61  FVC-Predicted Pre % 84 86  FVC-Post L 3.10 3.07  FVC-Predicted Post % 74 73  Pre FEV1/FVC % % 82 82  Post FEV1/FCV % % 86 88  FEV1-Pre L 2.88 2.97  FEV1-Predicted Pre % 87 90  FEV1-Post L 2.67 2.69  DLCO uncorrected ml/min/mmHg - 22.39  DLCO UNC% % - 72  DLVA Predicted % - 86  TLC L - 6.18  TLC % Predicted % - 106  RV % Predicted % - 132    Labs:  Path:  Echo:  Heart Catheterization:  Assessment & Plan:   Severe persistent asthma with acute exacerbation  Discussion: [name] [name] is a 56 year old woman, never smoker with history of pituitary adenoma s/p resection and adrenal insufficiency who  returns to pulmonary clinic for severe persistent asthma on nucala.  She appears to be having exacerbation of her asthma due to not receiving Nucala in multiple months along with spring allergy symptoms.  She is to complete extended prednisone taper as outlined in the patient instructions.  She is to continue Symbicort 160-4.5 mcg 2 puffs twice daily.  She is to continue montelukast.  She is to also continue Zyrtec for allergy symptoms.  I have messaged our pharmacy team regarding her Nucala prescription and she should resume this as soon as possible.  She is to follow-up with Dr. Loanne Drilling  as scheduled.  Freda Jackson, MD [facility] Office: [phone]   Current Outpatient Medications:    ADDERALL XR 20 MG 24 hr capsule, Take 20 mg by mouth daily as needed (concentration)., Disp: , Rfl: 0   brompheniramine-pseudoephedrine-DM 30-2-10 MG/5ML syrup, Take 5 mLs by mouth 4 (four) times daily as needed., Disp: 473 mL, Rfl: 0   budesonide-formoterol (SYMBICORT) 160-4.5 MCG/ACT inhaler, Inhale 2 puffs into the lungs 2 (two) times daily., Disp: 1 Inhaler, Rfl: 6   EPINEPHrine 0.3 mg/0.3 mL IJ SOAJ injection, Inject 0.3 mg into the muscle as needed for anaphylaxis., Disp: 0.3 mL, Rfl: 2   estradiol (CLIMARA - DOSED IN MG/24 HR) 0.0375 mg/24hr patch, Place 0.0375 mg onto the skin once a week. saturdays, Disp: , Rfl:    ipratropium (ATROVENT) 0.02 % nebulizer solution, Take 2.5 mLs (0.5 mg total) by nebulization every 6 (six) hours as needed for wheezing or shortness of breath. icd- J42, Disp: 75 mL, Rfl: 12   Mepolizumab 100 MG/ML SOAJ, INJECT 1 ML (100 MG TOTAL) INTO THE SKIN EVERY 28 (TWENTY-EIGHT) DAYS. DELIVER TO PATIENT'S HOME FOR SELF-ADMINISTRATION, Disp: 1 mL, Rfl: 5   montelukast (SINGULAIR) 10 MG tablet, TAKE 1 TABLET BY MOUTH EVERYDAY AT BEDTIME, Disp: 90 tablet, Rfl: 3   OVER THE COUNTER MEDICATION, Take 6 tablets by mouth daily. Smarty Pants Vitamins, Disp: , Rfl:     PROAIR HFA 108 (90 Base) MCG/ACT inhaler, Inhale 2 puffs into the lungs every 6 (six) hours as needed for wheezing or shortness of breath., Disp: 8 g, Rfl: 5   Probiotic CAPS, Take 1 capsule by mouth daily. , Disp: , Rfl:    Respiratory Therapy Supplies (FLUTTER) DEVI, Use flutter valve 3 times a day, Disp: 1 each, Rfl: 0   rosuvastatin (CRESTOR) 20 MG tablet, Take 20 mg by mouth daily., Disp: , Rfl:    sertraline (ZOLOFT) 50 MG tablet, Take 150 mg by mouth daily., Disp: , Rfl:    telmisartan (MICARDIS) 40 MG tablet, Take 40 mg by mouth daily., Disp: , Rfl:    topiramate (TOPAMAX) 50 MG tablet, Take 150 mg by mouth daily., Disp: , Rfl:    valACYclovir (VALTREX) 1000 MG tablet, Take two tablets as a single dose prn cold sore. (Patient taking differently: Take 2,000 mg by mouth daily as needed. Take two tablets as a single dose prn cold sore.), Disp: 20 tablet, Rfl: 1  Current Facility-Administered Medications:    Mepolizumab SOLR 100 mg, 100 mg, Subcutaneous, Q28 days, Margaretha Seeds, MD, 100 mg at 04/16/19 1022   Mepolizumab SOLR 100 mg, 100 mg, Subcutaneous, Q28 days, Margaretha Seeds, MD, 100 mg at 05/13/19 3557   Mepolizumab SOLR 100 mg, 100 mg, Subcutaneous, Q28 days, Margaretha Seeds, MD, 100 mg at 06/10/19 3220   Mepolizumab SOLR 100 mg, 100 mg, Subcutaneous, Q28 days, Margaretha Seeds, MD, 100 mg at 08/05/19 1001   Mepolizumab SOLR 100 mg, 100 mg, Subcutaneous, Q28 days, Margaretha Seeds, MD, 100 mg at 09/02/19 0954   Mepolizumab SOLR 100 mg, 100 mg, Subcutaneous, Q28 days, Margaretha Seeds, MD, 100 mg at 09/30/19 1141   Mepolizumab SOLR 100 mg, 100 mg, Subcutaneous, Q28 days, Margaretha Seeds, MD, 100 mg at 11/25/19 1215   Mepolizumab SOLR 100 mg, 100 mg, Subcutaneous, Q28 days, Margaretha Seeds, MD, 100 mg at 12/23/19 1137   Mepolizumab SOLR 100 mg, 100 mg, Subcutaneous, Q28 days, Margaretha Seeds, MD, 100 mg at  03/02/20 1001   Mepolizumab SOLR 100 mg, 100 mg, Subcutaneous, Q28  days, Margaretha Seeds, MD, 100 mg at 04/06/20 5041   Mepolizumab SOLR 100 mg, 100 mg, Subcutaneous, Q28 days, Margaretha Seeds, MD, 100 mg at 05/04/20 [phone]

## 2021-11-25 NOTE — Patient Instructions (Addendum)
Prednisone taper: ?40mg  daily x 3 days ?30mg  daily x 3 days ?20mg  daily x 3 days ?10mg  daily x 3 days ? ?Continue symbicort inhaler 2 puffs twice daily ?- rinse mouth out after each use ? ?Use ipratropium nebulizer as needed ? ?Use albuterol inhaler 1-2 puffs every 4-6 hours as needed ? ?We will check with our pharmacy team about the nucala medication ?

## 2021-11-25 NOTE — Telephone Encounter (Addendum)
Per test claim, patient has new insurance. Was previously filling with [facility] but was lost to f/u despite multiple attempts from pharmacy to schedule shipment. ? ?Submitted an URGENT Prior Authorization request to [facility] via CoverMyMeds. Will update once we receive a response. ? ?Key: [id number] ? ?Knox Saliva, PharmD, MPH, BCPS ?Clinical Pharmacist (Rheumatology and Pulmonology) ? ? ?----- Message from Freddi Starr, MD sent at 11/25/2021 12:07 PM EST ----- ?Regarding: Nucala medication ?Hi Nijae Doyel, ? ?This patient reports not receving her nucala since last fall. I see a refill was sent in earlier this year thought. Not sure what the issue is. I would appreciate your help! ? ?Thanks, ?JD ? ?

## 2021-11-25 NOTE — Telephone Encounter (Deleted)
-----   Message from Freddi Starr, MD sent at 11/25/2021 12:07 PM EST ----- ?Regarding: Nucala medication ?Hi Jp Eastham, ? ?This patient reports not receving her nucala since last fall. I see a refill was sent in earlier this year thought. Not sure what the issue is. I would appreciate your help! ? ?Thanks, ?JD ? ?

## 2021-11-25 NOTE — Telephone Encounter (Signed)
Received notification from [facility] regarding a prior authorization for [place]. Authorization has been APPROVED from 11/25/21 to 11/24/22.  Awaiting approval letter ? ?Unable to run test claim - Prior authorization not yet entered into the system. ? ?Knox Saliva, PharmD, MPH, BCPS ?Clinical Pharmacist (Rheumatology and Pulmonology) ? ?

## 2021-11-25 NOTE — Telephone Encounter (Signed)
Error

## 2021-11-26 ENCOUNTER — Other Ambulatory Visit (HOSPITAL_COMMUNITY): Payer: Self-pay

## 2021-11-26 NOTE — Telephone Encounter (Addendum)
Per test claim, patient has no copay after copay card is used. Copay before copay assistance is $100 per fill. ATC patient to help coordinate first shipment, but unable to reach. Left detailed VM with [employer] phone number. ? ?Knox Saliva, PharmD, MPH, BCPS ?Clinical Pharmacist (Rheumatology and Pulmonology) ?

## 2021-11-27 ENCOUNTER — Encounter: Payer: Self-pay | Admitting: Pulmonary Disease

## 2021-11-29 ENCOUNTER — Other Ambulatory Visit (HOSPITAL_COMMUNITY): Payer: Self-pay

## 2021-12-01 ENCOUNTER — Other Ambulatory Visit (HOSPITAL_COMMUNITY): Payer: Self-pay

## 2021-12-01 NOTE — Telephone Encounter (Signed)
Copay card information: ? ?Member ID: [id number] ?Group Number: [id number] ?Rx Bin: [id number] ?PCN: [id number] ? ?Knox Saliva, PharmD, MPH, BCPS ?Clinical Pharmacist (Rheumatology and Pulmonology) ? ?

## 2021-12-02 ENCOUNTER — Other Ambulatory Visit (HOSPITAL_COMMUNITY): Payer: Self-pay

## 2021-12-20 ENCOUNTER — Other Ambulatory Visit (HOSPITAL_COMMUNITY): Payer: Self-pay

## 2021-12-22 ENCOUNTER — Other Ambulatory Visit (HOSPITAL_COMMUNITY): Payer: Self-pay

## 2021-12-25 ENCOUNTER — Other Ambulatory Visit: Payer: Self-pay | Admitting: Specialist

## 2021-12-27 ENCOUNTER — Encounter: Payer: Self-pay | Admitting: Pulmonary Disease

## 2021-12-27 ENCOUNTER — Ambulatory Visit: Payer: BC Managed Care – PPO | Admitting: Pulmonary Disease

## 2021-12-27 ENCOUNTER — Other Ambulatory Visit: Payer: Self-pay | Admitting: Specialist

## 2021-12-27 VITALS — BP 126/84 | HR 86 | Temp 98.0°F | Ht 69.0 in | Wt 243.0 lb

## 2021-12-27 DIAGNOSIS — J4551 Severe persistent asthma with (acute) exacerbation: Secondary | ICD-10-CM | POA: Diagnosis not present

## 2021-12-27 MED ORDER — PROAIR HFA 108 (90 BASE) MCG/ACT IN AERS: 2.0000 | INHALATION_SPRAY | Freq: Four times a day (QID) | RESPIRATORY_TRACT | 5 refills | Status: DC | PRN

## 2021-12-27 MED ORDER — PREDNISONE 10 MG PO TABS: ORAL_TABLET | ORAL | 0 refills | Status: DC

## 2021-12-27 MED ORDER — ADVAIR HFA 230-21 MCG/ACT IN AERO: 2.0000 | INHALATION_SPRAY | Freq: Two times a day (BID) | RESPIRATORY_TRACT | 5 refills | Status: DC

## 2021-12-27 NOTE — Patient Instructions (Addendum)
Severe Persistent Eosinophilic Asthma ?Asthma Exacerbation ?--START prednisone taper ?--STOP Symbicort ?--START Advair 230-21 mcg TWO puffs TWICE a day ?--CONTINUE Nucala ?--CONTINUE Albuterol as needed for shortness of breath or wheezing ? ?Asthma Action Plan ?IF you develop asthma symptoms (chest tightness, cough and wheezing), take an extra 2 puffs of Symbicort or Albuterol as needed every 4 hours. Call us for persistent symptoms steroids. ? ?Follow-up with me in 6 months ?

## 2021-12-27 NOTE — Progress Notes (Signed)
? ?Synopsis:  ?Referred in 06/2018 for chronic productive cough and shortness of breath. ?Methacholine challenge 07/2018 consistent with asthma ? ?Subjective:  ? ?PATIENT ID: [name] [name] GENDER: female DOB: [birth year], MRN: [id number] ? ? ?HPI ? ?Chief Complaint  ?Patient presents with  ? Follow-up  ?  The last few months she has had more sob and coughing.  She is using rescue inhaler more often, it makes her more comfortable. Has to sit up to breathe better.  This is unusual for her.  ? 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 ?Synopsis:  ?Diagnosed with asthma on 07/2018 methacholine challenge. Started on [place] on 03/16/2019.  Overall well-controlled since initiation of biologic.  She previously had 3 exacerbations a year.  While one Nucala only occurred at most once a year. After missing doses since the fall 2022 she has had worsening symptoms and restarted injections ? 11/2021. ? ?Interval ?Since our last visit she had an exacerbation this month. Has been coughing more and increased shortness of breath. Occasional wheezing. Had to use albuterol every 3-4 hours. Had to use the nebulizer for ongoing symptoms. She restarted her Nucala once month ago.  ? ?2019 Jan Feb Mar April May June July Aug Sept Oct Nov Dec  ?          Junius Argyle  ?2020 Jan Feb Mar April May June July Aug Sept Oct Nov Dec  ? X    X       X  ? ?2021 Jan Feb Mar April May June July Aug Sept Oct Nov Dec  ?              ?2022 Jan Feb Mar April May June July Aug Sept Oct Nov Dec  ? X          X   ?2023 Jan Feb Mar April May June July Aug Sept Oct Nov Dec  ?   XX           ? ?Asthma Control Test ACT Total Score  ?12/27/2021 ? 3:54 PM 8  ?07/08/2019 ? 9:40 AM 19  ? ?Social History: Non-smoker ?--She has had recent stressors. Her husband has been diagnosed with Parkinson.  Her father  passed this winter in 2021. ?--Works with youth in foster care system ? ?Past Medical History:  ?Diagnosis Date  ? Adrenal gland hypofunction ([place])   ? related to post op, vancomycin & lumbar injection, steroids    ? Arthritis   ? hnp-lumbar   ? Blood dyscrasia   ? hx probable blood clot after ankle surgery  not definitive but treated per pt  ? Cancer [facility])   ? "early evolvng melanoma "- legs   ? Depression   ? Diabetes insipidus ([place])   ? Family history of adverse reaction to anesthesia   ? mom has N/V - zofran works  ? Heart murmur   ? years ago-echo 05 no problems  ? High cholesterol   ? History of blood transfusion   ? "16 so far" (07/25/2012)  ? History of bronchitis   ? History of UTI   ? Hypertension   ? no med for 1 yr  ? 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 Migraines   ? Peripheral vascular disease ([place])   ? ? dvt 2011 tx as preventive although did not show on scan after  ankle surgery  ? Pneumonia ~ 2009; 2010  ? PONV (postoperative nausea and vomiting)   ? last 2 surgeries less nausea  ? PONV (postoperative nausea and vomiting)   ? none with last surgery-be extra careful with nose patient haas septal hole   ?  ? ?Allergies  ?Allergen Reactions  ? Amoxicillin Anaphylaxis, Hives and Itching  ? Black Wal[place] Pollen Allergy Skin Test Anaphylaxis  ? Penicillins Anaphylaxis  ?  Has patient had a PCN reaction causing immediate rash, facial/tongue/throat swelling, SOB or lightheadedness with hypotension: Yes ?Has patient had a PCN reaction causing severe rash involving mucus membranes or skin necrosis: No ?Has patient had a PCN reaction that required hospitalization Yes ?Has patient had a PCN reaction occurring within the last 10 years: No ?If all of the above answers are "NO", then may proceed with Cephalosporin use. ?  ? Sulfa Antibiotics Hives and Other (See Comments)  ?  Hives, mouth blisters ?  ? Chloroquine Other (See Comments)  ?  Renal dysfunction  ? Ciprofloxacin Hives  ? Phenergan [Promethazine Hcl] Other  (See Comments)  ?  Caused "severe drop in blood pressure."  ? Sulfamethoxazole Hives  ? Other Other (See Comments)  ?  From allergy testing--Canteloupe  ? Shellfish Allergy Itching and Rash  ? Vancomycin Cross Reactors Other (See Comments)  ?  "RED MAN SYNDROME"  ?Pt stated she had no reaction to Vancomycin when it is given slow  ?  ? ?Outpatient Medications Prior to Visit  ?Medication Sig Dispense Refill  ? ADDERALL XR 20 MG 24 hr capsule Take 20 mg by mouth daily as needed (concentration).  0  ? brompheniramine-pseudoephedrine-DM 30-2-10 MG/5ML syrup Take 5 mLs by mouth 4 (four) times daily as needed. 473 mL 0  ? budesonide-formoterol (SYMBICORT) 160-4.5 MCG/ACT inhaler Inhale 2 puffs into the lungs 2 (two) times daily. 1 Inhaler 6  ? EPINEPHrine 0.3 mg/0.3 mL IJ SOAJ injection Inject 0.3 mg into the muscle as needed for anaphylaxis. 0.3 mL 2  ? estradiol (CLIMARA - DOSED IN MG/24 HR) 0.0375 mg/24hr patch Place 0.0375 mg onto the skin once a week. saturdays    ? ipratropium (ATROVENT) 0.02 % nebulizer solution Take 2.5 mLs (0.5 mg total) by nebulization every 6 (six) hours as needed for wheezing or shortness of breath. icd- J42 75 mL 12  ? Mepolizumab 100 MG/ML SOAJ INJECT 1 ML (100 MG TOTAL) INTO THE SKIN EVERY 28 (TWENTY-EIGHT) DAYS. DELIVER TO PATIENT'S HOME FOR SELF-ADMINISTRATION 1 mL 5  ? montelukast (SINGULAIR) 10 MG tablet TAKE 1 TABLET BY MOUTH EVERYDAY AT BEDTIME 90 tablet 3  ? OVER THE COUNTER MEDICATION Take 6 tablets by mouth daily. Smarty Pants Vitamins    ? PROAIR HFA 108 (90 Base) MCG/ACT inhaler Inhale 2 puffs into the lungs every 6 (six) hours as needed for wheezing or shortness of breath. 8 g 5  ? Probiotic CAPS Take 1 capsule by mouth daily.     ? Respiratory Therapy Supplies (FLUTTER) DEVI Use flutter valve 3 times a day 1 each 0  ? rosuvastatin (CRESTOR) 20 MG tablet Take 20 mg by mouth daily.    ? sertraline (ZOLOFT) 50 MG tablet Take 150 mg by mouth daily.    ? telmisartan (MICARDIS) 40 MG  tablet Take 40 mg by mouth daily.    ? topiramate (TOPAMAX) 50 MG tablet Take 150 mg by mouth daily.    ? valACYclovir (VALTREX) 1000 MG tablet Take two tablets as a single dose prn  cold sore. (Patient taking differently: Take 2,000 mg by mouth daily as needed. Take two tablets as a single dose prn cold sore.) 20 tablet 1  ? ?Facility-Administered Medications Prior to Visit  ?Medication Dose Route Frequency Provider Last Rate Last Admin  ? Mepolizumab SOLR 100 mg  100 mg Subcutaneous Q28 days Margaretha Seeds, MD   100 mg at 04/16/19 1022  ? Mepolizumab SOLR 100 mg  100 mg Subcutaneous Q28 days Margaretha Seeds, MD   100 mg at 05/13/19 0160  ? Mepolizumab SOLR 100 mg  100 mg Subcutaneous Q28 days Margaretha Seeds, MD   100 mg at 06/10/19 1093  ? Mepolizumab SOLR 100 mg  100 mg Subcutaneous Q28 days Margaretha Seeds, MD   100 mg at 08/05/19 1001  ? Mepolizumab SOLR 100 mg  100 mg Subcutaneous Q28 days Margaretha Seeds, MD   100 mg at 09/02/19 2355  ? Mepolizumab SOLR 100 mg  100 mg Subcutaneous Q28 days Margaretha Seeds, MD   100 mg at 09/30/19 1141  ? Mepolizumab SOLR 100 mg  100 mg Subcutaneous Q28 days Margaretha Seeds, MD   100 mg at 11/25/19 1215  ? Mepolizumab SOLR 100 mg  100 mg Subcutaneous Q28 days Margaretha Seeds, MD   100 mg at 12/23/19 1137  ? Mepolizumab SOLR 100 mg  100 mg Subcutaneous Q28 days Margaretha Seeds, MD   100 mg at 03/02/20 1001  ? Mepolizumab SOLR 100 mg  100 mg Subcutaneous Q28 days Margaretha Seeds, MD   100 mg at 04/06/20 7322  ? Mepolizumab SOLR 100 mg  100 mg Subcutaneous Q28 days Margaretha Seeds, MD   100 mg at 05/04/20 0254  ? ? ?Review of Systems  ?Constitutional:  Negative for chills, diaphoresis, fever, malaise/fatigue and weight loss.  ?HENT:  Negative for congestion.   ?Respiratory:  Positive for cough, shortness of breath and wheezing. Negative for hemoptysis and sputum production.   ?Cardiovascular:  Negative for chest pain, palpitations and leg swelling.   ? ? ?Objective:  ? ?Vitals:  ? 12/27/21 1556  ?BP: 126/84  ?Pulse: 86  ?Temp: 98 ?F (36.7 ?C)  ?TempSrc: Oral  ?SpO2: 97%  ?Weight: 243 lb (110.2 kg)  ?Height: '5\' 9"'$  (1.753 m)  ? ?SpO2: 97 % ?O2 Device: None

## 2021-12-27 NOTE — Progress Notes (Signed)
Patient seen in the office today and instructed on use of advair.  Patient expressed understanding and demonstrated technique.  ?

## 2021-12-29 ENCOUNTER — Other Ambulatory Visit: Payer: Self-pay

## 2021-12-29 MED ORDER — ALBUTEROL SULFATE HFA 108 (90 BASE) MCG/ACT IN AERS: 2.0000 | INHALATION_SPRAY | Freq: Four times a day (QID) | RESPIRATORY_TRACT | 2 refills | Status: DC | PRN

## 2022-01-07 DIAGNOSIS — I872 Venous insufficiency (chronic) (peripheral): Secondary | ICD-10-CM | POA: Diagnosis not present

## 2022-01-07 DIAGNOSIS — I10 Essential (primary) hypertension: Secondary | ICD-10-CM | POA: Diagnosis not present

## 2022-01-11 DIAGNOSIS — F4323 Adjustment disorder with mixed anxiety and depressed mood: Secondary | ICD-10-CM | POA: Diagnosis not present

## 2022-01-11 DIAGNOSIS — F330 Major depressive disorder, recurrent, mild: Secondary | ICD-10-CM | POA: Diagnosis not present

## 2022-01-11 DIAGNOSIS — F900 Attention-deficit hyperactivity disorder, predominantly inattentive type: Secondary | ICD-10-CM | POA: Diagnosis not present

## 2022-01-11 DIAGNOSIS — L989 Disorder of the skin and subcutaneous tissue, unspecified: Secondary | ICD-10-CM | POA: Diagnosis not present

## 2022-01-11 DIAGNOSIS — D1801 Hemangioma of skin and subcutaneous tissue: Secondary | ICD-10-CM | POA: Diagnosis not present

## 2022-01-11 DIAGNOSIS — D485 Neoplasm of uncertain behavior of skin: Secondary | ICD-10-CM | POA: Diagnosis not present

## 2022-01-12 ENCOUNTER — Telehealth: Payer: Self-pay | Admitting: *Deleted

## 2022-01-12 ENCOUNTER — Encounter: Payer: Self-pay | Admitting: Adult Health

## 2022-01-12 ENCOUNTER — Ambulatory Visit (INDEPENDENT_AMBULATORY_CARE_PROVIDER_SITE_OTHER): Payer: BC Managed Care – PPO

## 2022-01-12 ENCOUNTER — Ambulatory Visit: Payer: BC Managed Care – PPO | Admitting: Adult Health

## 2022-01-12 ENCOUNTER — Other Ambulatory Visit (HOSPITAL_COMMUNITY): Payer: Self-pay

## 2022-01-12 VITALS — BP 120/80 | HR 94 | Temp 98.8°F | Ht 69.0 in | Wt 247.0 lb

## 2022-01-12 DIAGNOSIS — R059 Cough, unspecified: Secondary | ICD-10-CM | POA: Diagnosis not present

## 2022-01-12 DIAGNOSIS — J4551 Severe persistent asthma with (acute) exacerbation: Secondary | ICD-10-CM | POA: Diagnosis not present

## 2022-01-12 DIAGNOSIS — J4550 Severe persistent asthma, uncomplicated: Secondary | ICD-10-CM | POA: Diagnosis not present

## 2022-01-12 DIAGNOSIS — J309 Allergic rhinitis, unspecified: Secondary | ICD-10-CM | POA: Insufficient documentation

## 2022-01-12 DIAGNOSIS — K219 Gastro-esophageal reflux disease without esophagitis: Secondary | ICD-10-CM

## 2022-01-12 DIAGNOSIS — J301 Allergic rhinitis due to pollen: Secondary | ICD-10-CM

## 2022-01-12 DIAGNOSIS — J45909 Unspecified asthma, uncomplicated: Secondary | ICD-10-CM | POA: Diagnosis not present

## 2022-01-12 LAB — NITRIC OXIDE: FeNO level (ppb): 11

## 2022-01-12 MED ORDER — PREDNISONE 20 MG PO TABS: 20.0000 mg | ORAL_TABLET | Freq: Every day | ORAL | 0 refills | Status: DC

## 2022-01-12 MED ORDER — DOXYCYCLINE HYCLATE 100 MG PO TABS: 100.0000 mg | ORAL_TABLET | Freq: Two times a day (BID) | ORAL | 0 refills | Status: DC

## 2022-01-12 MED ORDER — BENZONATATE 200 MG PO CAPS: 200.0000 mg | ORAL_CAPSULE | Freq: Three times a day (TID) | ORAL | 3 refills | Status: DC | PRN

## 2022-01-12 MED ORDER — SPIRIVA RESPIMAT 2.5 MCG/ACT IN AERS: 2.0000 | INHALATION_SPRAY | Freq: Every day | RESPIRATORY_TRACT | 5 refills | Status: DC

## 2022-01-12 MED ORDER — ALBUTEROL SULFATE (2.5 MG/3ML) 0.083% IN NEBU: 2.5000 mg | INHALATION_SOLUTION | Freq: Four times a day (QID) | RESPIRATORY_TRACT | 5 refills | Status: DC | PRN

## 2022-01-12 NOTE — Assessment & Plan Note (Addendum)
Recurrent asthmatic bronchitic exacerbation.  Patient appears to be doing well on a maintenance regimen with Symbicort and Nucala however has been off of Nucala for 4 months.  Only recently restarting receiving 2 injections in the last few weeks.  Patient has received 3 courses of steroids in the last 4 months. ?We will maximize her maintenance regimen.  Add in Spiriva to her Symbicort regimen.  May use albuterol nebulizer as needed. ?Asthma action plan as discussed. ?Control for triggers with reflux and rhinitis ?Check chest x-ray today ?She has received quite a bit of steroids.  We will only give a short low-dose steroid burst.  Treat with antibiotics for possible underlying bronchitis/early sinusitis.  She has multiple drug allergies. ? ?Plan  ?Patient Instructions  ?Chest x-ray today. ?Continue on Symbicort 2 puffs twice daily, rinse after use ?Asthma action plan ?Change Zyrtec to Allegra 180 mg.qd in am .  ?Add Chlorphenramine '4mg'$  (Chlor tabs) 1 At bedtime  As needed  drainage .  ?Add Spiriva 2 puffs once daily. ?Continue on Singulair daily  ?Add Albuterol neb every 6hrs as needed.  ?Doxycycline '100mg'$  Twice daily  for 1 week, - take with food. Wear sunscreen .  ?Prednisone '20mg'$  daily daily for 5 days -take with food.  ?Delsym 2 tsp Twice daily for cough As needed   ?Tessalon Three times a day  for cough As needed   ?Continue on Nucala .  ?Saline nasal rinses Twice daily   ?Follow up with Dr. Loanne Drilling or Teva Bronkema NP in 4 weeks and As needed   ?Please contact office for sooner follow up if symptoms do not improve or worsen or seek emergency care  ? ? ? ? ? ? ?  ? ?

## 2022-01-12 NOTE — Telephone Encounter (Signed)
Messaged [place] with the pharmacy team regarding generic Advair denial and what else is covered besides Symbicort.  This was his response: ? ?Looks like Dentist and Memory Dance are also covered--However her insurance WILL pay for brand-name Advair at $20, which is actually cheaper than the $40 she would pay for the other three alternatives. Send in the Rx as DAW and she should be good to go. The PA Team could also work on getting a Prior Auth for the generic, although I don't know if it would be any cheaper than the $20 she would pay for the brand-name. ? ?Tammy, ?Please advise.  Thank you. ?

## 2022-01-12 NOTE — Assessment & Plan Note (Signed)
Exacerbation ?Change Zyrtec to Allegra and add in chlor tabs at bedtime ? ?Plan  ?Patient Instructions  ?Chest x-ray today. ?Continue on Symbicort 2 puffs twice daily, rinse after use ?Asthma action plan ?Change Zyrtec to Allegra 180 mg.qd in am .  ?Add Chlorphenramine '4mg'$  (Chlor tabs) 1 At bedtime  As needed  drainage .  ?Add Spiriva 2 puffs once daily. ?Continue on Singulair daily  ?Add Albuterol neb every 6hrs as needed.  ?Doxycycline '100mg'$  Twice daily  for 1 week, - take with food. Wear sunscreen .  ?Prednisone '20mg'$  daily daily for 5 days -take with food.  ?Delsym 2 tsp Twice daily for cough As needed   ?Tessalon Three times a day  for cough As needed   ?Continue on Nucala .  ?Saline nasal rinses Twice daily   ?Follow up with Dr. Loanne Drilling or Odell Choung NP in 4 weeks and As needed   ?Please contact office for sooner follow up if symptoms do not improve or worsen or seek emergency care  ? ? ? ? ? ? ?  ? ?

## 2022-01-12 NOTE — Patient Instructions (Addendum)
Chest x-ray today. ?Continue on Symbicort 2 puffs twice daily, rinse after use ?Asthma action plan ?Change Zyrtec to Allegra 180 mg.qd in am .  ?Add Chlorphenramine '4mg'$  (Chlor tabs) 1 At bedtime  As needed  drainage .  ?Add Spiriva 2 puffs once daily. ?Continue on Singulair daily  ?Add Albuterol neb every 6hrs as needed.  ?Doxycycline '100mg'$  Twice daily  for 1 week, - take with food. Wear sunscreen .  ?Prednisone '20mg'$  daily daily for 5 days -take with food.  ?Delsym 2 tsp Twice daily for cough As needed   ?Tessalon Three times a day  for cough As needed   ?Continue on Nucala .  ?Saline nasal rinses Twice daily   ?Follow up with Dr. Loanne Drilling or Tamina Cyphers NP in 4 weeks and As needed   ?Please contact office for sooner follow up if symptoms do not improve or worsen or seek emergency care  ? ? ? ? ? ? ?

## 2022-01-12 NOTE — Assessment & Plan Note (Signed)
Continue on GERD diet.  Prilosec. ?

## 2022-01-12 NOTE — Progress Notes (Signed)
? ?'@Patient'$  ID: [name] [name], female    DOB: 08-Aug-1966, 56 y.o.   MRN: [id number] ? ?Chief Complaint  ?Patient presents with  ? Acute Visit  ? ? ?Referring provider: ?Avva, Steva Ready, MD ? 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 ?TEST/EVENTS :  ?Methacholine challenge test November 2019 positive for asthma ? ?Started on [facility] June 2020 ? ?Eosinophil 252 (3.4%) 09/25/18 ? ?CXR 09/25/2018-normal chest x-ray with clear lung fields without evidence of infiltrate, effusion or edema ?CXR 10/16/2020-no infiltrate or effusion or edema. ?CT abdomen/pelvis 07/14/2021-lower lung fields with no parenchymal abnormalities, effusion or edema.  Diverticulosis, small umbilical hernia. ?  ?PFT:  ?08/07/2018-Mild bronchial hyper reactivity suggestive of asthma ? ?01/12/2022 Acute OV : Asthma , [place]  ?Patient presents for an acute office visit.  Patient has underlying severe persistent asthma with allergic phenotype.  Patient has had several asthma exacerbations over the last 4-5 months .  She is maintained on Symbicort and Nucala.  However her Nucala was stopped for 4 months due to insurance issues.  She was able to restart this in March but has been having frequent flares of her asthma.  She was treated with a steroid taper in November , early March and early April for an asthma exacerbations.  Previously while taking Nucala felt that her asthma was under much better control.  She is very frustrated as her weight has been increasing up about 20 to 25 pounds over the last few months. ?She was seen 2 weeks ago and given a prednisone taper.  Patient says she got better for short period of time.  Patient complains over the last 5 days she has had increased cough wheezing and tightness.  She has had some chills.  Also is now having  thick sinus congestion and productive cough with yellow mucus.  She says the cough is aggravating.  Has some intermittent reflux and sinus drainage. ?Last visit she was changed from Symbicort to Advair but insurance did not cover.  She remains on Symbicort twice daily.,  Singulair daily. ?Exhaled nitric oxide testing today is 11ppb.  ? ?Patient has been followed by dermatology.  She has had a rash on lower extremities and has had recent biopsy, results are pending.  There is concern from the dermatologist for possible vasculitis. ?Patient says that she did travel to [place] and this is when the rash started. ? ? ?Allergies  ?Allergen Reactions  ? Amoxicillin Anaphylaxis, Hives and Itching  ? Black [place] Pollen Allergy Skin Test Anaphylaxis  ? Penicillins Anaphylaxis  ?  Has patient had a PCN reaction causing immediate rash, facial/tongue/throat swelling, SOB or lightheadedness with hypotension: Yes ?Has patient had a PCN reaction causing severe rash involving mucus membranes or skin necrosis: No ?Has patient had a PCN reaction that required hospitalization Yes ?Has patient had a PCN reaction occurring within the last 10 years: No ?If all of the above answers are "NO", then may proceed with Cephalosporin use. ?  ? Sulfa Antibiotics Hives and Other (See Comments)  ?  Hives, mouth blisters ?  ? Chloroquine Other (See Comments)  ?  Renal dysfunction  ? Ciprofloxacin Hives  ? Phenergan [Promethazine Hcl] Other (See Comments)  ?  Caused "severe drop in blood pressure."  ? Sulfamethoxazole Hives  ? Other Other (See Comments)  ?  From allergy testing--Canteloupe  ? Shellfish Allergy Itching  and Rash  ? Vancomycin Cross Reactors Other (See Comments)  ?  "RED MAN SYNDROME"  ?Pt stated she had no reaction to Vancomycin when it is given slow  ? ? ?Immunization History  ?Administered Date(s) Administered  ? Influenza, Quadrivalent, Recombinant, Inj, Pf 05/27/2020, 06/03/2021  ? Influenza,inj,Quad PF,6+ Mos 07/01/2016,  07/14/2017, 05/27/2019, 05/27/2020  ? PFIZER(Purple Top)SARS-COV-2 Vaccination 12/12/2019, 01/06/2020, 07/21/2020, 09/25/2020, 04/14/2021  ? Pneumococcal Polysaccharide-23 06/24/2020  ? Td 06/18/2013  ? Tdap 06/27/2011, 07/14/2014  ? Zoster Recombinat (Shingrix) 02/23/2021, 04/14/2021  ? ? ?Past Medical History:  ?Diagnosis Date  ? Adrenal gland hypofunction ([place])   ? related to post op, vancomycin & lumbar injection, steroids    ? Arthritis   ? hnp-lumbar   ? Blood dyscrasia   ? hx probable blood clot after ankle surgery  not definitive but treated per pt  ? Cancer [facility])   ? "early evolvng melanoma "- legs   ? Depression   ? Diabetes insipidus ([place])   ? Family history of adverse reaction to anesthesia   ? mom has N/V - zofran works  ? Heart murmur   ? years ago-echo 05 no problems  ? High cholesterol   ? History of blood transfusion   ? "16 so far" (07/25/2012)  ? History of bronchitis   ? History of UTI   ? Hypertension   ? no med for 1 yr  ? 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 Migraines   ? Peripheral vascular disease ([place])   ? ? dvt 2011 tx as preventive although did not show on scan after ankle surgery  ? Pneumonia ~ 2009; 2010  ? PONV (postoperative nausea and vomiting)   ? last 2 surgeries less nausea  ? PONV (postoperative nausea and vomiting)   ? none with last surgery-be extra careful with nose patient haas septal hole   ? ? ?Tobacco History: ?Social History  ? ?Tobacco Use  ?Smoking Status Never  ?Smokeless Tobacco Never  ? ?Counseling given: Not Answered ? ? ?Outpatient Medications Prior to Visit  ?Medication Sig Dispense Refill  ? ADDERALL XR 20 MG 24 hr capsule Take 20 mg by mouth daily as needed (concentration).  0  ? albuterol (VENTOLIN HFA) 108 (90 Base) MCG/ACT inhaler Inhale 2 puffs into the lungs every 6 (six) hours as needed for wheezing or shortness of breath. 8 g 2  ? brompheniramine-pseudoephedrine-DM 30-2-10 MG/5ML syrup Take 5 mLs by mouth 4 (four) times daily as needed. 473 mL 0  ?  EPINEPHrine 0.3 mg/0.3 mL IJ SOAJ injection Inject 0.3 mg into the muscle as needed for anaphylaxis. 0.3 mL 2  ? estradiol (CLIMARA - DOSED IN MG/24 HR) 0.0375 mg/24hr patch Place 0.0375 mg onto the skin once a week. saturdays    ? fluticasone-salmeterol (ADVAIR HFA) 230-21 MCG/ACT inhaler Inhale 2 puffs into the lungs 2 (two) times daily. 1 each 5  ? ipratropium (ATROVENT) 0.02 % nebulizer solution Take 2.5 mLs (0.5 mg total) by nebulization every 6 (six) hours as needed for wheezing or shortness of breath. icd- J42 75 mL 12  ? Mepolizumab 100 MG/ML SOAJ INJECT 1 ML (100 MG TOTAL) INTO THE SKIN EVERY 28 (TWENTY-EIGHT) DAYS. DELIVER TO PATIENT'S HOME FOR SELF-ADMINISTRATION 1 mL 5  ? montelukast (SINGULAIR) 10 MG tablet TAKE 1 TABLET BY MOUTH EVERYDAY AT BEDTIME 90 tablet 3  ? OVER THE COUNTER MEDICATION Take 6 tablets by mouth daily. Smarty Pants Vitamins    ? pregabalin (LYRICA) 50 MG capsule TAKE  ONE CAPSULE BY MOUTH TWICE A DAY 60 capsule 0  ? PROAIR HFA 108 (90 Base) MCG/ACT inhaler Inhale 2 puffs into the lungs every 6 (six) hours as needed for wheezing or shortness of breath. 8 g 5  ? Probiotic CAPS Take 1 capsule by mouth daily.     ? Respiratory Therapy Supplies (FLUTTER) DEVI Use flutter valve 3 times a day 1 each 0  ? rosuvastatin (CRESTOR) 20 MG tablet Take 20 mg by mouth daily.    ? sertraline (ZOLOFT) 50 MG tablet Take 150 mg by mouth daily.    ? telmisartan (MICARDIS) 40 MG tablet Take 40 mg by mouth daily.    ? topiramate (TOPAMAX) 50 MG tablet Take 150 mg by mouth daily.    ? valACYclovir (VALTREX) 1000 MG tablet Take two tablets as a single dose prn cold sore. (Patient taking differently: Take 2,000 mg by mouth daily as needed. Take two tablets as a single dose prn cold sore.) 20 tablet 1  ? predniSONE (DELTASONE) 10 MG tablet Take 6 tablets x three days (60 mg), followed by 5 tablets x three days ('50mg'$ ), then 4 tablets x three day('40mg'$ ), then 3 tablets ('30mg'$ ) x three days, then 2 tablets ('20mg'$ ) x  three days, then 1 tablet ('10mg'$ ) x three days, then STOP (Patient not taking: Reported on 01/12/2022) 60 tablet 0  ? ?Facility-Administered Medications Prior to Visit  ?Medication Dose Route Frequency Provider Last Rate

## 2022-01-13 ENCOUNTER — Other Ambulatory Visit (HOSPITAL_COMMUNITY): Payer: Self-pay

## 2022-01-13 NOTE — Telephone Encounter (Signed)
We are continued on Symbicort.  And added and Spiriva.  No further Rx is needed ?

## 2022-01-16 ENCOUNTER — Other Ambulatory Visit: Payer: Self-pay | Admitting: Pulmonary Disease

## 2022-01-19 ENCOUNTER — Other Ambulatory Visit (HOSPITAL_COMMUNITY): Payer: Self-pay

## 2022-01-19 NOTE — Telephone Encounter (Signed)
Sent!

## 2022-01-23 ENCOUNTER — Other Ambulatory Visit: Payer: Self-pay | Admitting: Specialist

## 2022-02-08 ENCOUNTER — Other Ambulatory Visit (HOSPITAL_COMMUNITY): Payer: Self-pay

## 2022-02-09 ENCOUNTER — Ambulatory Visit (INDEPENDENT_AMBULATORY_CARE_PROVIDER_SITE_OTHER): Payer: BC Managed Care – PPO | Admitting: Adult Health

## 2022-02-09 ENCOUNTER — Other Ambulatory Visit (HOSPITAL_COMMUNITY): Payer: Self-pay

## 2022-02-09 ENCOUNTER — Encounter: Payer: Self-pay | Admitting: Adult Health

## 2022-02-09 VITALS — BP 114/78 | HR 88 | Temp 97.8°F | Ht 69.0 in | Wt 247.2 lb

## 2022-02-09 DIAGNOSIS — R0602 Shortness of breath: Secondary | ICD-10-CM | POA: Diagnosis not present

## 2022-02-09 DIAGNOSIS — J301 Allergic rhinitis due to pollen: Secondary | ICD-10-CM | POA: Diagnosis not present

## 2022-02-09 DIAGNOSIS — J4551 Severe persistent asthma with (acute) exacerbation: Secondary | ICD-10-CM | POA: Diagnosis not present

## 2022-02-09 DIAGNOSIS — E669 Obesity, unspecified: Secondary | ICD-10-CM | POA: Diagnosis not present

## 2022-02-09 DIAGNOSIS — R0609 Other forms of dyspnea: Secondary | ICD-10-CM

## 2022-02-09 LAB — BASIC METABOLIC PANEL
BUN: 23 mg/dL (ref 6–23)
CO2: 28 mEq/L (ref 19–32)
Calcium: 9.6 mg/dL (ref 8.4–10.5)
Chloride: 105 mEq/L (ref 96–112)
Creatinine, Ser: 0.96 mg/dL (ref 0.40–1.20)
GFR: 66.81 mL/min (ref >60.00)
Glucose, Bld: 90 mg/dL (ref 70–99)
Potassium: 4.4 mEq/L (ref 3.5–5.1)
Sodium: 139 mEq/L (ref 135–145)

## 2022-02-09 LAB — HEMOGLOBIN A1C: Hgb A1c MFr Bld: 5.9 % (ref 4.6–6.5)

## 2022-02-09 MED ORDER — BENZONATATE 200 MG PO CAPS: 200.0000 mg | ORAL_CAPSULE | Freq: Three times a day (TID) | ORAL | 3 refills | Status: DC | PRN

## 2022-02-09 MED ORDER — BREZTRI AEROSPHERE 160-9-4.8 MCG/ACT IN AERO: 2.0000 | INHALATION_SPRAY | Freq: Two times a day (BID) | RESPIRATORY_TRACT | 3 refills | Status: DC

## 2022-02-09 MED ORDER — HYDROCODONE BIT-HOMATROP MBR 5-1.5 MG/5ML PO SOLN: 5.0000 mL | Freq: Every evening | ORAL | 0 refills | Status: DC | PRN

## 2022-02-09 NOTE — Assessment & Plan Note (Signed)
Continue with maintenance regimen ? ?Plan  ?Patient Instructions  ?Change Symbicort /Spiriva to BREZTRI 2 puffs Twice daily   ?Asthma action plan ?Allegra 180 mg daily in am .  ?Chlorphenramine '4mg'$  (Chlor tabs) 1 At bedtime  As needed  drainage .  ?Continue on Singulair daily  ?Albuterol neb every 6hrs as needed.  ?Delsym 2 tsp Twice daily for cough As needed   ?Tessalon Three times a day  for cough As needed   ?Continue on Nucala .  ?Saline nasal rinses Twice daily   ?Hydromet 1 tsp At bedtime  for severe cough As needed   ?Omeprazole daily  ?Labs today  ?Refer to healthy weight and wellness.  ?Follow up with Dr. Loanne Drilling or Wil Slape NP in 6-8  weeks and As needed   ?Please contact office for sooner follow up if symptoms do not improve or worsen or seek emergency care  ? ? ? ? ? ? ?  ? ?

## 2022-02-09 NOTE — Assessment & Plan Note (Addendum)
Difficult to control severe persistent asthma with allergic phenotype.  Patient was previously doing well on Nucala but since stopping for several months due to insurance issues patient's asthma has flared and she is required multiple steroid courses.  Weight has increased 70 pounds.  We will maximize her maintenance regimen.  Control for triggers and help with cough control.-We will use Delsym and Tessalon.  We will add in Hydromet for bedtime to help with nighttime cough.  Patient education on use of sedating medications. ?Asthma action plan discussed ? ?Plan  ?Patient Instructions  ?Change Symbicort /Spiriva to BREZTRI 2 puffs Twice daily   ?Asthma action plan ?Allegra 180 mg daily in am .  ?Chlorphenramine '4mg'$  (Chlor tabs) 1 At bedtime  As needed  drainage .  ?Continue on Singulair daily  ?Albuterol neb every 6hrs as needed.  ?Delsym 2 tsp Twice daily for cough As needed   ?Tessalon Three times a day  for cough As needed   ?Continue on Nucala .  ?Saline nasal rinses Twice daily   ?Hydromet 1 tsp At bedtime  for severe cough As needed   ?Omeprazole daily  ?Labs today  ?Refer to healthy weight and wellness.  ?Follow up with Dr. Loanne Drilling or Gabriella Guile NP in 6-8  weeks and As needed   ?Please contact office for sooner follow up if symptoms do not improve or worsen or seek emergency care  ? ? ? ? ? ? ?  ? ?

## 2022-02-09 NOTE — Progress Notes (Signed)
? ?'@Patient'$  ID: [name] [name], female    DOB: Jan 16, 1966, 56 y.o.   MRN: [id number] ? ?Chief Complaint  ?Patient presents with  ? Follow-up  ? ? ?Referring provider: ?Prince Solian, MD ? 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 ?TEST/EVENTS :  ?Methacholine challenge test November 2019 positive for asthma ?  ?Started on [facility] June 2020 ?  ?Eosinophil 252 (3.4%) 09/25/18 ?  ?CXR 09/25/2018-normal chest x-ray with clear lung fields without evidence of infiltrate, effusion or edema ?CXR 10/16/2020-no infiltrate or effusion or edema. ?CT abdomen/pelvis 07/14/2021-lower lung fields with no parenchymal abnormalities, effusion or edema.  Diverticulosis, small umbilical hernia. ?  ?PFT:  ?08/07/2018-Mild bronchial hyper reactivity suggestive of asthma ? ?02/09/2022 Follow up : Asthma /AR  ?Patient returns for a 1 month follow-up.  Patient was seen last visit for an asthmatic bronchitic exacerbation.  Patient had had increased asthma symptoms with cough, wheezing , and shortness of breath.  She has been maintained on Symbicort, Singulair and Nucala.  However her Nucala was stopped for 5-6 months due to insurance issues.  While off Nucala asthma has been out of control . She was able to restart Nucala few weeks ago but continued to have ongoing cough and congestion.Marland Kitchen ?Exhaled nitric oxide testing last visit was 11 ppb .  Chest x-ray last visit was clear. Home covid test x 3 were negative . She was treated with a steroid taper in November , early March and early April for an asthma exacerbations.   ?Last visit she was treated with doxycycline and prednisone.  Spiriva was added to her current regimen.  Zyrtec was changed to [place].  Chlor tabs were added at bedtime.  Since last visit patient is feeling some better. Still has lingering cough.  Has gained 70 lbs . She is having hard time losing weight . Trying to walk 1 mile on treadmill but has to use Albuterol inhaler . No hemoptysis , calf pain or swelling. ? ?She has been following with dermatology for a rash along her lower extremities.  Was undergoing workup for vasculitis-was ruled out . Labs reviewed showed weakly positive ANA . CRP/RA factor negative . Told needs to be checked for DM .  ? ?Denies any snoring, daytime sleepiness or restless sleep. ? ?Allergies  ?Allergen Reactions  ? Amoxicillin Anaphylaxis, Hives and Itching  ? Black [place] Pollen Allergy Skin Test Anaphylaxis  ? Penicillins Anaphylaxis  ?  Has patient had a PCN reaction causing immediate rash, facial/tongue/throat swelling, SOB or lightheadedness with hypotension: Yes ?Has patient had a PCN reaction causing severe rash involving mucus membranes or skin necrosis: No ?Has patient had a PCN reaction that required hospitalization Yes ?Has patient had a PCN reaction occurring within the last 10 years: No ?If all of the above answers are "NO", then may proceed with Cephalosporin use. ?  ? Sulfa Antibiotics Hives and Other (See Comments)  ?  Hives, mouth blisters ?  ? Chloroquine Other (See Comments)  ?  Renal dysfunction  ? Ciprofloxacin Hives  ? Phenergan [Promethazine Hcl] Other (See Comments)  ?  Caused "severe drop in blood pressure."  ? Sulfamethoxazole Hives  ? Other Other (See Comments)  ?  From allergy testing--Canteloupe  ? Shellfish Allergy Itching and Rash  ? Vancomycin Cross Reactors Other (See Comments)  ?  "RED MAN SYNDROME"  ?Pt stated she had no  reaction to Vancomycin when it is given slow  ? ? ?Immunization History  ?Administered Date(s) Administered  ? Influenza, Quadrivalent, Recombinant, Inj, Pf 05/27/2020, 06/03/2021  ? Influenza,inj,Quad PF,6+ Mos 07/01/2016, 07/14/2017, 05/27/2019, 05/27/2020  ? PFIZER(Purple Top)SARS-COV-2 Vaccination 12/12/2019, 01/06/2020, 07/21/2020, 09/25/2020, 04/14/2021  ? Pneumococcal  Polysaccharide-23 06/24/2020  ? Td 06/18/2013  ? Tdap 06/27/2011, 07/14/2014  ? Zoster Recombinat (Shingrix) 02/23/2021, 04/14/2021  ? ? ?Past Medical History:  ?Diagnosis Date  ? 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 related to post op, vancomycin & lumbar injection, steroids    ? Arthritis   ? hnp-lumbar   ? Blood dyscrasia   ? hx probable blood clot after ankle surgery  not definitive but treated per pt  ? Cancer [facility])   ? "early evolvng melanoma "- legs   ? Depression   ? Diabetes insipidus ([place])   ? Family history of adverse reaction to anesthesia   ? mom has N/V - zofran works  ? Heart murmur   ? years ago-echo 05 no problems  ? High cholesterol   ? History of blood transfusion   ? "16 so far" (07/25/2012)  ? History of bronchitis   ? History of UTI   ? Hypertension   ? no med for 1 yr  ? 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 Migraines   ? Peripheral vascular disease ([place])   ? ? dvt 2011 tx as preventive although did not show on scan after ankle surgery  ? Pneumonia ~ 2009; 2010  ? PONV (postoperative nausea and vomiting)   ? last 2 surgeries less nausea  ? PONV (postoperative nausea and vomiting)   ? none with last surgery-be extra careful with nose patient haas septal hole   ? ? ?Tobacco History: ?Social History  ? ?Tobacco Use  ?Smoking Status Never  ?Smokeless Tobacco Never  ? ?Counseling given: Not Answered ? ? ?Outpatient Medications Prior to Visit  ?Medication Sig Dispense Refill  ? ADDERALL XR 20 MG 24 hr capsule Take 20 mg by mouth daily as needed (concentration).  0  ? albuterol (PROVENTIL) (2.5 MG/3ML) 0.083% nebulizer solution Take 3 mLs (2.5 mg total) by nebulization every 6 (six) hours as needed for wheezing or shortness of breath. 75 mL 5  ? albuterol (VENTOLIN HFA) 108 (90 Base) MCG/ACT inhaler Inhale 2 puffs into the lungs every 6 (six) hours as needed for wheezing or shortness of breath. 8 g 2  ? budesonide-formoterol (SYMBICORT) 160-4.5 MCG/ACT inhaler Inhale 2 puffs into the lungs 2 (two)  times daily.    ? EPINEPHrine 0.3 mg/0.3 mL IJ SOAJ injection Inject 0.3 mg into the muscle as needed for anaphylaxis. 0.3 mL 2  ? estradiol (CLIMARA - DOSED IN MG/24 HR) 0.0375 mg/24hr patch Place 0.0375 mg onto the skin once a week. saturdays    ? Mepolizumab 100 MG/ML SOAJ INJECT 1 ML (100 MG TOTAL) INTO THE SKIN EVERY 28 (TWENTY-EIGHT) DAYS. DELIVER TO PATIENT'S HOME FOR SELF-ADMINISTRATION 1 mL 5  ? montelukast (SINGULAIR) 10 MG tablet TAKE 1 TABLET BY MOUTH EVERYDAY AT BEDTIME 90 tablet 3  ? OVER THE COUNTER MEDICATION Take 6 tablets by mouth daily. Smarty Pants Vitamins    ? predniSONE (DELTASONE) 20 MG tablet Take 1 tablet (20 mg total) by mouth daily with breakfast. 5 tablet 0  ? pregabalin (LYRICA) 50 MG capsule TAKE ONE CAPSULE BY MOUTH TWICE A DAY 180 capsule 0  ? Probiotic CAPS Take 1 capsule by mouth daily.     ?  Respiratory Therapy Supplies (FLUTTER) DEVI Use flutter valve 3 times a day 1 each 0  ? rosuvastatin (CRESTOR) 20 MG tablet Take 20 mg by mouth daily.    ? sertraline (ZOLOFT) 50 MG tablet Take 150 mg by mouth daily.    ? telmisartan (MICARDIS) 40 MG tablet Take 40 mg by mouth daily.    ? Tiotropium Bromide Monohydrate (SPIRIVA RESPIMAT) 2.5 MCG/ACT AERS Inhale 2 puffs into the lungs daily. 1 g 5  ? topiramate (TOPAMAX) 50 MG tablet Take 150 mg by mouth daily.    ? valACYclovir (VALTREX) 1000 MG tablet Take two tablets as a single dose prn cold sore. (Patient taking differently: Take 2,000 mg by mouth daily as needed. Take two tablets as a single dose prn cold sore.) 20 tablet 1  ? benzonatate (TESSALON) 200 MG capsule Take 1 capsule (200 mg total) by mouth 3 (three) times daily as needed for cough. 45 capsule 3  ? brompheniramine-pseudoephedrine-DM 30-2-10 MG/5ML syrup TAKE 5 ML'S BY MOUTH FOUR TIMES A DAY AS NEEDED 240 mL 0  ? doxycycline (VIBRA-TABS) 100 MG tablet Take 1 tablet (100 mg total) by mouth 2 (two) times daily. (Patient not taking: Reported on 02/09/2022) 14 tablet 0  ?  fluticasone-salmeterol (ADVAIR HFA) 230-21 MCG/ACT inhaler Inhale 2 puffs into the lungs 2 (two) times daily. (Patient not taking: Reported on 02/09/2022) 1 each 5  ? ipratropium (ATROVENT) 0.02 % nebulizer solution T

## 2022-02-09 NOTE — Assessment & Plan Note (Addendum)
Encouraged on healthy weight loss.  Patient is very frustrated and has been unable to lose weight.  Is impacting her life and exercise. ?Refer to the healthy weight and wellness clinic ?Patient is high risk for diabetes.  Patient's been recommended by her dermatologist that she had a lower extremity rash that felt could be secondary to diabetes.  We will check A1c today.  Have advised her to follow-up with her primary care going forward ?

## 2022-02-09 NOTE — Assessment & Plan Note (Signed)
Persistent shortness of breath.  Patient is underwent a exercise cardiac stress test in 2021 that was negative.  ?Exam is unrevealing most likely secondary to underlying poorly controlled asthma and deconditioning with morbid obesity.  She is on hormone replacement.  We will check D-dimer.  If D-dimer is positive will need a CT chest angio to rule out PE ? ?Plan  ?Patient Instructions  ?Change Symbicort /Spiriva to BREZTRI 2 puffs Twice daily   ?Asthma action plan ?Allegra 180 mg daily in am .  ?Chlorphenramine '4mg'$  (Chlor tabs) 1 At bedtime  As needed  drainage .  ?Continue on Singulair daily  ?Albuterol neb every 6hrs as needed.  ?Delsym 2 tsp Twice daily for cough As needed   ?Tessalon Three times a day  for cough As needed   ?Continue on Nucala .  ?Saline nasal rinses Twice daily   ?Hydromet 1 tsp At bedtime  for severe cough As needed   ?Omeprazole daily  ?Labs today  ?Refer to healthy weight and wellness.  ?Follow up with Dr. Loanne Drilling or Adiel Erney NP in 6-8  weeks and As needed   ?Please contact office for sooner follow up if symptoms do not improve or worsen or seek emergency care  ? ? ? ? ? ? ?  ? ?

## 2022-02-09 NOTE — Patient Instructions (Addendum)
Change Symbicort /Spiriva to BREZTRI 2 puffs Twice daily   ?Asthma action plan ?Allegra 180 mg daily in am .  ?Chlorphenramine '4mg'$  (Chlor tabs) 1 At bedtime  As needed  drainage .  ?Continue on Singulair daily  ?Albuterol neb every 6hrs as needed.  ?Delsym 2 tsp Twice daily for cough As needed   ?Tessalon Three times a day  for cough As needed   ?Continue on Nucala .  ?Saline nasal rinses Twice daily   ?Hydromet 1 tsp At bedtime  for severe cough As needed   ?Omeprazole daily  ?Labs today  ?Refer to healthy weight and wellness.  ?Follow up with Dr. Loanne Drilling or Parrett NP in 6-8  weeks and As needed   ?Please contact office for sooner follow up if symptoms do not improve or worsen or seek emergency care  ? ? ? ? ? ? ?

## 2022-02-10 ENCOUNTER — Other Ambulatory Visit (HOSPITAL_COMMUNITY): Payer: Self-pay

## 2022-02-10 ENCOUNTER — Other Ambulatory Visit: Payer: BC Managed Care – PPO

## 2022-02-10 DIAGNOSIS — R0602 Shortness of breath: Secondary | ICD-10-CM

## 2022-02-10 DIAGNOSIS — R0609 Other forms of dyspnea: Secondary | ICD-10-CM

## 2022-02-10 DIAGNOSIS — F331 Major depressive disorder, recurrent, moderate: Secondary | ICD-10-CM | POA: Diagnosis not present

## 2022-02-10 DIAGNOSIS — L814 Other melanin hyperpigmentation: Secondary | ICD-10-CM | POA: Diagnosis not present

## 2022-02-10 DIAGNOSIS — F900 Attention-deficit hyperactivity disorder, predominantly inattentive type: Secondary | ICD-10-CM | POA: Diagnosis not present

## 2022-02-10 DIAGNOSIS — D1801 Hemangioma of skin and subcutaneous tissue: Secondary | ICD-10-CM | POA: Diagnosis not present

## 2022-02-10 DIAGNOSIS — Z08 Encounter for follow-up examination after completed treatment for malignant neoplasm: Secondary | ICD-10-CM | POA: Diagnosis not present

## 2022-02-10 DIAGNOSIS — Z86006 Personal history of melanoma in-situ: Secondary | ICD-10-CM | POA: Diagnosis not present

## 2022-02-10 LAB — D-DIMER, QUANTITATIVE: D-Dimer, Quant: 0.31 mcg/mL FEU (ref <0.50)

## 2022-02-10 NOTE — Addendum Note (Signed)
Addended by: Vanessa Barbara on: 02/10/2022 04:02 PM   Modules accepted: Orders

## 2022-02-11 NOTE — Progress Notes (Signed)
ATC x1.  LVM to return call. 

## 2022-02-14 ENCOUNTER — Other Ambulatory Visit (HOSPITAL_COMMUNITY): Payer: Self-pay

## 2022-02-14 NOTE — Progress Notes (Signed)
Called and spoke with patient, advised of results/recommendations per Tammy Parrett NP.  She verbalized understanding.  Nothing further needed.

## 2022-02-15 DIAGNOSIS — F900 Attention-deficit hyperactivity disorder, predominantly inattentive type: Secondary | ICD-10-CM | POA: Diagnosis not present

## 2022-02-15 DIAGNOSIS — F331 Major depressive disorder, recurrent, moderate: Secondary | ICD-10-CM | POA: Diagnosis not present

## 2022-02-16 ENCOUNTER — Other Ambulatory Visit (HOSPITAL_COMMUNITY): Payer: Self-pay

## 2022-03-08 ENCOUNTER — Other Ambulatory Visit (HOSPITAL_COMMUNITY): Payer: Self-pay

## 2022-03-09 DIAGNOSIS — F900 Attention-deficit hyperactivity disorder, predominantly inattentive type: Secondary | ICD-10-CM | POA: Diagnosis not present

## 2022-03-09 DIAGNOSIS — F331 Major depressive disorder, recurrent, moderate: Secondary | ICD-10-CM | POA: Diagnosis not present

## 2022-03-10 ENCOUNTER — Other Ambulatory Visit (HOSPITAL_COMMUNITY): Payer: Self-pay

## 2022-03-11 ENCOUNTER — Other Ambulatory Visit (HOSPITAL_COMMUNITY): Payer: Self-pay

## 2022-03-28 ENCOUNTER — Other Ambulatory Visit (HOSPITAL_COMMUNITY): Payer: Self-pay

## 2022-03-30 ENCOUNTER — Other Ambulatory Visit (HOSPITAL_COMMUNITY): Payer: Self-pay

## 2022-03-31 ENCOUNTER — Other Ambulatory Visit (HOSPITAL_COMMUNITY): Payer: Self-pay

## 2022-04-07 DIAGNOSIS — F900 Attention-deficit hyperactivity disorder, predominantly inattentive type: Secondary | ICD-10-CM | POA: Diagnosis not present

## 2022-04-07 DIAGNOSIS — F331 Major depressive disorder, recurrent, moderate: Secondary | ICD-10-CM | POA: Diagnosis not present

## 2022-04-13 ENCOUNTER — Other Ambulatory Visit: Payer: Self-pay | Admitting: Nurse Practitioner

## 2022-04-13 ENCOUNTER — Other Ambulatory Visit (HOSPITAL_COMMUNITY): Payer: Self-pay

## 2022-04-13 DIAGNOSIS — Z1231 Encounter for screening mammogram for malignant neoplasm of breast: Secondary | ICD-10-CM

## 2022-04-15 ENCOUNTER — Ambulatory Visit
Admission: RE | Admit: 2022-04-15 | Discharge: 2022-04-15 | Disposition: A | Discharge status: Home / Self Care | Payer: BC Managed Care – PPO | Source: Ambulatory Visit | Attending: Nurse Practitioner | Admitting: Nurse Practitioner

## 2022-04-15 DIAGNOSIS — Z1231 Encounter for screening mammogram for malignant neoplasm of breast: Secondary | ICD-10-CM

## 2022-04-18 ENCOUNTER — Other Ambulatory Visit (HOSPITAL_COMMUNITY): Payer: Self-pay

## 2022-04-20 ENCOUNTER — Other Ambulatory Visit (HOSPITAL_COMMUNITY): Payer: Self-pay

## 2022-04-25 ENCOUNTER — Other Ambulatory Visit (HOSPITAL_COMMUNITY): Payer: Self-pay

## 2022-04-26 ENCOUNTER — Other Ambulatory Visit (HOSPITAL_COMMUNITY): Payer: Self-pay

## 2022-04-27 ENCOUNTER — Other Ambulatory Visit: Payer: Self-pay

## 2022-04-27 ENCOUNTER — Other Ambulatory Visit (HOSPITAL_COMMUNITY): Payer: Self-pay

## 2022-05-03 ENCOUNTER — Other Ambulatory Visit (HOSPITAL_COMMUNITY): Payer: Self-pay

## 2022-05-10 DIAGNOSIS — I10 Essential (primary) hypertension: Secondary | ICD-10-CM | POA: Diagnosis not present

## 2022-05-10 DIAGNOSIS — E785 Hyperlipidemia, unspecified: Secondary | ICD-10-CM | POA: Diagnosis not present

## 2022-05-10 DIAGNOSIS — R7301 Impaired fasting glucose: Secondary | ICD-10-CM | POA: Diagnosis not present

## 2022-05-12 DIAGNOSIS — F331 Major depressive disorder, recurrent, moderate: Secondary | ICD-10-CM | POA: Diagnosis not present

## 2022-05-12 DIAGNOSIS — F900 Attention-deficit hyperactivity disorder, predominantly inattentive type: Secondary | ICD-10-CM | POA: Diagnosis not present

## 2022-05-17 DIAGNOSIS — Z0000 Encounter for general adult medical examination without abnormal findings: Secondary | ICD-10-CM | POA: Diagnosis not present

## 2022-05-17 DIAGNOSIS — I872 Venous insufficiency (chronic) (peripheral): Secondary | ICD-10-CM | POA: Diagnosis not present

## 2022-05-17 DIAGNOSIS — Z23 Encounter for immunization: Secondary | ICD-10-CM | POA: Diagnosis not present

## 2022-05-17 DIAGNOSIS — R82998 Other abnormal findings in urine: Secondary | ICD-10-CM | POA: Diagnosis not present

## 2022-05-18 ENCOUNTER — Other Ambulatory Visit (HOSPITAL_COMMUNITY): Payer: Self-pay

## 2022-05-20 ENCOUNTER — Other Ambulatory Visit (HOSPITAL_COMMUNITY): Payer: Self-pay

## 2022-05-23 ENCOUNTER — Other Ambulatory Visit: Payer: Self-pay | Admitting: Pulmonary Disease

## 2022-05-23 ENCOUNTER — Other Ambulatory Visit (HOSPITAL_COMMUNITY): Payer: Self-pay

## 2022-05-23 DIAGNOSIS — R6882 Decreased libido: Secondary | ICD-10-CM | POA: Diagnosis not present

## 2022-05-23 DIAGNOSIS — R61 Generalized hyperhidrosis: Secondary | ICD-10-CM | POA: Diagnosis not present

## 2022-05-23 DIAGNOSIS — N951 Menopausal and female climacteric states: Secondary | ICD-10-CM | POA: Diagnosis not present

## 2022-05-23 DIAGNOSIS — J4550 Severe persistent asthma, uncomplicated: Secondary | ICD-10-CM

## 2022-05-24 ENCOUNTER — Ambulatory Visit: Payer: BC Managed Care – PPO | Admitting: Pulmonary Disease

## 2022-05-24 ENCOUNTER — Encounter: Payer: Self-pay | Admitting: Pulmonary Disease

## 2022-05-24 ENCOUNTER — Telehealth: Payer: Self-pay | Admitting: Pharmacist

## 2022-05-24 ENCOUNTER — Other Ambulatory Visit (HOSPITAL_COMMUNITY): Payer: Self-pay

## 2022-05-24 ENCOUNTER — Other Ambulatory Visit: Payer: BC Managed Care – PPO

## 2022-05-24 VITALS — BP 132/78 | HR 93 | Ht 69.0 in | Wt 238.6 lb

## 2022-05-24 DIAGNOSIS — R0609 Other forms of dyspnea: Secondary | ICD-10-CM | POA: Diagnosis not present

## 2022-05-24 DIAGNOSIS — J4551 Severe persistent asthma with (acute) exacerbation: Secondary | ICD-10-CM | POA: Diagnosis not present

## 2022-05-24 DIAGNOSIS — J42 Unspecified chronic bronchitis: Secondary | ICD-10-CM | POA: Diagnosis not present

## 2022-05-24 MED ORDER — MONTELUKAST SODIUM 10 MG PO TABS: ORAL_TABLET | ORAL | 3 refills | Status: DC

## 2022-05-24 MED ORDER — NUCALA 100 MG/ML ~~LOC~~ SOAJ
SUBCUTANEOUS | 5 refills | Status: DC
  Filled 2022-05-24: qty 1, 28d supply, fill #0

## 2022-05-24 MED ORDER — BREZTRI AEROSPHERE 160-9-4.8 MCG/ACT IN AERO: 2.0000 | INHALATION_SPRAY | Freq: Two times a day (BID) | RESPIRATORY_TRACT | 11 refills | Status: DC

## 2022-05-24 MED ORDER — PREDNISONE 10 MG PO TABS
ORAL_TABLET | ORAL | 0 refills | Status: AC
Start: 2022-05-24 — End: 2022-05-31

## 2022-05-24 NOTE — Telephone Encounter (Signed)
Received message from Dr. Loanne Drilling that plan is to transition patient to [place] from [facility] therapy. Please start [place] pending OV note from 05/24/22 to be signed.  Dose: '600mg'$  SQ at Week 0, then '300mg'$  every 2 weeks thereafter  Dx: Severe persistent asthma (J45.50)  Previously tried therapies: Nucala - current, multiple exacerbations this year   Patient updating CBC w diff and IgE at [place] on 05/24/22.  She fills Nucala with [facility] so will likely be able to fill Dupixent w [facility].  Once approved through insurance, patient will need to be enrolled into copay card.  Knox Saliva, PharmD, MPH, BCPS, CPP Clinical Pharmacist (Rheumatology and Pulmonology)

## 2022-05-24 NOTE — Progress Notes (Signed)
Subjective:   PATIENT ID: [name] [name] GENDER: female DOB: 09-03-1966, MRN: [id number]   HPI  Chief Complaint  Patient presents with   Follow-up    asthma   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 Started on [place] on 03/16/2019.  Overall well-controlled since initiation of biologic.  She previously had 3 exacerbations a year.  While on Nucala only occurred at most once a year. After missing doses since the fall 2022 she has had worsening symptoms and restarted injections 11/2021.  05/24/22 She was last seen in April x 2 in Pulmonary clinic for exacerbation. Was stepped up to triple therapy. She continues to have periodic but persistent cough throughout the last 4 months. She started Ozempic and lost weight. She is walking daily on treadmill. She has been back on Nucala for several months and overall does not feel she has the same level of symptom control as she did in the past. She reports nasal congestion. Unable to nasal sprays due to hx sinus issues.She has been taking Breztri, Symbicort and Spiriva. She didn't realize she was supposed to stop symbicort and spiriva. She has air purifiers in every room to help with allergies.  2019 Jan Feb Mar April May June July Aug Sept Oct Nov Dec            X X X  0923 Jan Feb Mar April May June July Aug Sept Oct Nov Dec   X    X       X   2021 Jan Feb Mar April May June July Aug Sept Oct Nov Dec                2022 Jan Feb Mar April May June July Aug Sept Oct Nov Dec   X          X   2023 Jan Feb Mar April May June July Aug Sept Oct Nov Dec     XX XX    X       Asthma Control Test ACT Total Score  05/24/2022  3:28 PM 14  02/09/2022  9:06 AM 8  01/12/2022  9:09 AM 7   Social History:  Non-smoker --She has had recent stressors. Her husband has been diagnosed with Parkinson.  Her father passed this winter in 2021. --Works with youth in foster care system  Past Medical History:  Diagnosis Date   Adrenal gland hypofunction ([place])    related to post op, vancomycin & lumbar injection, steroids     Arthritis    hnp-lumbar    Blood dyscrasia    hx probable blood clot after ankle surgery  not definitive but treated per pt   Cancer ([place])    "early evolvng melanoma "- legs    Depression    Diabetes insipidus ([place])    Family history of adverse reaction to anesthesia    mom has N/V - zofran works   Heart murmur    years ago-echo 05 no problems   High cholesterol    History of blood transfusion    "16 so far" (07/25/2012)   History of bronchitis    History of UTI    Hypertension    no med for 1 yr   Malaria by plasmodium vivax 1999   Migraines  Peripheral vascular disease ([place])    ? dvt 2011 tx as preventive although did not show on scan after ankle surgery   Pneumonia ~ 2009; 2010   PONV (postoperative nausea and vomiting)    last 2 surgeries less nausea   PONV (postoperative nausea and vomiting)    none with last surgery-be extra careful with nose patient haas septal hole      Allergies  Allergen Reactions   Amoxicillin Anaphylaxis, Hives and Itching   Black [place] Pollen Allergy Skin Test Anaphylaxis   Penicillins Anaphylaxis    Has patient had a PCN reaction causing immediate rash, facial/tongue/throat swelling, SOB or lightheadedness with hypotension: Yes Has patient had a PCN reaction causing severe rash involving mucus membranes or skin necrosis: No Has patient had a PCN reaction that required hospitalization Yes Has patient had a PCN reaction occurring within the last 10 years: No If all of the above answers are "NO", then may proceed with Cephalosporin use.    Sulfa Antibiotics Hives and Other (See Comments)    Hives, mouth blisters    Chloroquine  Other (See Comments)    Renal dysfunction   Ciprofloxacin Hives   Phenergan [Promethazine Hcl] Other (See Comments)    Caused "severe drop in blood pressure."   Sulfamethoxazole Hives   Other Other (See Comments)    From allergy testing--Canteloupe   Shellfish Allergy Itching and Rash   Vancomycin Cross Reactors Other (See Comments)    "RED MAN SYNDROME"  Pt stated she had no reaction to Vancomycin when it is given slow     Outpatient Medications Prior to Visit  Medication Sig Dispense Refill   ADDERALL XR 20 MG 24 hr capsule Take 20 mg by mouth daily as needed (concentration).  0   albuterol (PROVENTIL) (2.5 MG/3ML) 0.083% nebulizer solution Take 3 mLs (2.5 mg total) by nebulization every 6 (six) hours as needed for wheezing or shortness of breath. 75 mL 5   albuterol (VENTOLIN HFA) 108 (90 Base) MCG/ACT inhaler Inhale 2 puffs into the lungs every 6 (six) hours as needed for wheezing or shortness of breath. 8 g 2   Budeson-Glycopyrrol-Formoterol (BREZTRI AEROSPHERE) 160-9-4.8 MCG/ACT AERO Inhale 2 puffs into the lungs in the morning and at bedtime. 1 each 3   EPINEPHrine 0.3 mg/0.3 mL IJ SOAJ injection Inject 0.3 mg into the muscle as needed for anaphylaxis. 0.3 mL 2   estradiol (CLIMARA - DOSED IN MG/24 HR) 0.0375 mg/24hr patch Place 0.0375 mg onto the skin once a week. saturdays     Mepolizumab (NUCALA) 100 MG/ML SOAJ INJECT 1 ML (100 MG TOTAL) INTO THE SKIN EVERY 28 (TWENTY-EIGHT) DAYS. DELIVER TO PATIENT'S HOME FOR SELF-ADMINISTRATION 1 mL 5   montelukast (SINGULAIR) 10 MG tablet TAKE 1 TABLET BY MOUTH EVERYDAY AT BEDTIME 90 tablet 3   OVER THE COUNTER MEDICATION Take 6 tablets by mouth daily. Smarty Pants Vitamins     PROAIR HFA 108 (90 Base) MCG/ACT inhaler Inhale 2 puffs into the lungs every 6 (six) hours as needed for wheezing or shortness of breath. 8 g 5   Probiotic CAPS Take 1 capsule by mouth daily.      rosuvastatin (CRESTOR) 20 MG tablet Take 20 mg by mouth daily.      sertraline (ZOLOFT) 50 MG tablet Take 150 mg by mouth daily.     telmisartan (MICARDIS) 40 MG tablet Take 40 mg by mouth daily.     Tiotropium Bromide Monohydrate (SPIRIVA RESPIMAT) 2.5 MCG/ACT AERS Inhale 2 puffs  into the lungs daily. 1 g 5   topiramate (TOPAMAX) 50 MG tablet Take 150 mg by mouth daily.     valACYclovir (VALTREX) 1000 MG tablet Take two tablets as a single dose prn cold sore. (Patient taking differently: Take 2,000 mg by mouth daily as needed. Take two tablets as a single dose prn cold sore.) 20 tablet 1   benzonatate (TESSALON) 200 MG capsule Take 1 capsule (200 mg total) by mouth 3 (three) times daily as needed for cough. (Patient not taking: Reported on 05/24/2022) 45 capsule 3   budesonide-formoterol (SYMBICORT) 160-4.5 MCG/ACT inhaler Inhale 2 puffs into the lungs 2 (two) times daily. (Patient not taking: Reported on 05/24/2022)     doxycycline (VIBRA-TABS) 100 MG tablet Take 1 tablet (100 mg total) by mouth 2 (two) times daily. (Patient not taking: Reported on 02/09/2022) 14 tablet 0   HYDROcodone bit-homatropine (HYDROMET) 5-1.5 MG/5ML syrup Take 5 mLs by mouth at bedtime as needed for cough. (Patient not taking: Reported on 05/24/2022) 120 mL 0   predniSONE (DELTASONE) 20 MG tablet Take 1 tablet (20 mg total) by mouth daily with breakfast. (Patient not taking: Reported on 05/24/2022) 5 tablet 0   pregabalin (LYRICA) 50 MG capsule TAKE ONE CAPSULE BY MOUTH TWICE A DAY 180 capsule 0   Respiratory Therapy Supplies (FLUTTER) DEVI Use flutter valve 3 times a day (Patient not taking: Reported on 05/24/2022) 1 each 0   Facility-Administered Medications Prior to Visit  Medication Dose Route Frequency Provider Last Rate Last Admin   Mepolizumab SOLR 100 mg  100 mg Subcutaneous Q28 days Margaretha Seeds, MD   100 mg at 04/16/19 1022   Mepolizumab SOLR 100 mg  100 mg Subcutaneous Q28 days Margaretha Seeds, MD   100 mg at 05/13/19 3818   Mepolizumab SOLR 100 mg  100 mg Subcutaneous Q28  days Margaretha Seeds, MD   100 mg at 06/10/19 2993   Mepolizumab SOLR 100 mg  100 mg Subcutaneous Q28 days Margaretha Seeds, MD   100 mg at 08/05/19 1001   Mepolizumab SOLR 100 mg  100 mg Subcutaneous Q28 days Margaretha Seeds, MD   100 mg at 09/02/19 0954   Mepolizumab SOLR 100 mg  100 mg Subcutaneous Q28 days Margaretha Seeds, MD   100 mg at 09/30/19 1141   Mepolizumab SOLR 100 mg  100 mg Subcutaneous Q28 days Margaretha Seeds, MD   100 mg at 11/25/19 1215   Mepolizumab SOLR 100 mg  100 mg Subcutaneous Q28 days Margaretha Seeds, MD   100 mg at 12/23/19 1137   Mepolizumab SOLR 100 mg  100 mg Subcutaneous Q28 days Margaretha Seeds, MD   100 mg at 03/02/20 1001   Mepolizumab SOLR 100 mg  100 mg Subcutaneous Q28 days Margaretha Seeds, MD   100 mg at 04/06/20 7169   Mepolizumab SOLR 100 mg  100 mg Subcutaneous Q28 days Margaretha Seeds, MD   100 mg at 05/04/20 6789    Review of Systems  Constitutional:  Negative for chills, diaphoresis, fever, malaise/fatigue and weight loss.  HENT:  Positive for congestion.   Respiratory:  Positive for cough and shortness of breath. Negative for hemoptysis, sputum production and wheezing.   Cardiovascular:  Negative for chest pain, palpitations and leg swelling.     Objective:   Vitals:   05/24/22 1528  Pulse: 93  SpO2: 98%  Weight: 238 lb 9.6 oz (108.2 kg)  Height: '5\' 9"'$  (1.753  m)   SpO2: 98 %  Physical Exam: General: Well-appearing, no acute distress HENT: [place], AT Eyes: EOMI, no scleral icterus Respiratory: Clear to auscultation bilaterally.  No crackles, wheezing or rales Cardiovascular: RRR, -M/R/G, no JVD Extremities:-Edema,-tenderness Neuro: AAO x4, CNII-XII grossly intact Psych: Normal mood, normal affect  Labs Eosinophil 252 (3.4%) 09/25/18  Chest imaging: CXR 09/25/2018-normal chest x-ray with clear lung fields without evidence of infiltrate, effusion or edema CXR 10/16/2020-no infiltrate or effusion or edema. CT  abdomen/pelvis 07/14/2021-lower lung fields with no parenchymal abnormalities, effusion or edema.  Diverticulosis, small umbilical hernia. CXR 01/12/22- No acute abnormalities  PFT:  08/07/2018-Mild bronchial hyper reactivity suggestive of asthma    Assessment & Plan:  56 year old female with severe persistent asthma who presents for follow-up. Previously well controlled on Nucala since 02/19/2019 however had to take a break due to insurance reasons at the end of 2022. Continuing to have breakthrough symptoms and exacerbations despite restarting Nucala in 11/2021. Counseled extensively on bronchodilator regimen. Discussed with pharmacy regarding biologic transition as her current regimen has failed.  Severe Persistent Eosinophilic Asthma Asthma exacerbation --ORDER prednisone taper --CONTINUE Breztri TWO puffs in morning and night. Rinse mouth out after use --STOP Spiriva and STOP Symbicort --CONTINUE Albuterol as needed for shortness of breath or wheezing --CONTINUE Nucala for now  --Plan to transition to [place] CBC with diff and IgE  Asthma Action Plan IF you develop asthma symptoms (chest tightness, cough and wheezing), take an extra 2 puffs of Symbicort or Albuterol as needed every 4 hours. Call us for persistent symptoms steroids.  Immunization History  Administered Date(s) Administered   Influenza, Quadrivalent, Recombinant, Inj, Pf 05/27/2020, 06/03/2021   Influenza,inj,Quad PF,6+ Mos 07/01/2016, 07/14/2017, 05/27/2019, 05/27/2020   PFIZER(Purple Top)SARS-COV-2 Vaccination 12/12/2019, 01/06/2020, 07/21/2020, 09/25/2020, 04/14/2021   Pneumococcal Polysaccharide-23 06/24/2020, 05/17/2022   Td 06/18/2013   Tdap 06/27/2011, 07/14/2014   Zoster Recombinat (Shingrix) 02/23/2021, 04/14/2021   Orders Placed This Encounter  Procedures   CBC w/Diff    Standing Status:   Future    Number of Occurrences:   1    Standing Expiration Date:   05/24/2023   IgE    Standing Status:    Future    Number of Occurrences:   1    Standing Expiration Date:   05/25/2023   Meds ordered this encounter  Medications   Budeson-Glycopyrrol-Formoterol (BREZTRI AEROSPHERE) 160-9-4.8 MCG/ACT AERO    Sig: Inhale 2 puffs into the lungs in the morning and at bedtime.    Dispense:  1 each    Refill:  11   montelukast (SINGULAIR) 10 MG tablet    Sig: TAKE 1 TABLET BY MOUTH EVERYDAY AT BEDTIME    Dispense:  90 tablet    Refill:  3   predniSONE (DELTASONE) 10 MG tablet    Sig: Take 4 tablets (40 mg total) by mouth daily with breakfast for 2 days, THEN 3 tablets (30 mg total) daily with breakfast for 2 days, THEN 2 tablets (20 mg total) daily with breakfast for 2 days, THEN 1 tablet (10 mg total) daily with breakfast for 2 days.    Dispense:  20 tablet    Refill:  0    Return in about 4 months (around 09/23/2022).  I have spent a total time of 40-minutes on the day of the appointment including chart review, data review, collecting history, coordinating care and discussing medical diagnosis and plan with the patient/family. Past medical history, allergies, medications were reviewed. Pertinent imaging,  labs and tests included in this note have been reviewed and interpreted independently by me.  Auburn Hester Rodman Pickle, MD [facility] 05/24/2022 3:37 PM

## 2022-05-24 NOTE — Telephone Encounter (Signed)
Refill sent for [facility] to [facility]: [phone]   Dose: 100 mg SQ every 4 weeks  Last OV: 05/24/22 Provider: Dr. Loanne Drilling  Next OV: not yet scheduled  Knox Saliva, PharmD, MPH, BCPS Clinical Pharmacist (Rheumatology and Pulmonology)

## 2022-05-24 NOTE — Patient Instructions (Signed)
  Severe Persistent Eosinophilic Asthma Asthma exacerbation --ORDER prednisone taper --CONTINUE Breztri TWO puffs in morning and night. Rinse mouth out after use --STOP Spiriva and STOP Symbicort --CONTINUE Albuterol as needed for shortness of breath or wheezing --CONTINUE Nucala for now  --Plan to transition to [place] CBC with diff and IgE  Asthma Action Plan IF you develop asthma symptoms (chest tightness, cough and wheezing), take an extra 2 puffs of Symbicort or Albuterol as needed every 4 hours. Call us for persistent symptoms steroids.  Follow-up with me in 4 months

## 2022-05-25 LAB — IGE: IgE (Immunoglobulin E), Serum: 6 kU/L (ref <=114)

## 2022-05-25 LAB — CBC WITH DIFFERENTIAL/PLATELET
Basophils Absolute: 0 10*3/uL (ref 0.0–0.1)
Basophils Relative: 0.5 % (ref 0.0–3.0)
Eosinophils Absolute: 0 10*3/uL (ref 0.0–0.7)
Eosinophils Relative: 0.6 % (ref 0.0–5.0)
HCT: 41 % (ref 36.0–46.0)
Hemoglobin: 13.2 g/dL (ref 12.0–15.0)
Lymphocytes Relative: 24.3 % (ref 12.0–46.0)
Lymphs Abs: 1.8 10*3/uL (ref 0.7–4.0)
MCHC: 32.3 g/dL (ref 30.0–36.0)
MCV: 94 fl (ref 78.0–100.0)
Monocytes Absolute: 0.5 10*3/uL (ref 0.1–1.0)
Monocytes Relative: 6.7 % (ref 3.0–12.0)
Neutro Abs: 4.9 10*3/uL (ref 1.4–7.7)
Neutrophils Relative %: 67.9 % (ref 43.0–77.0)
Platelets: 238 10*3/uL (ref 150.0–400.0)
RBC: 4.36 Mil/uL (ref 3.87–5.11)
RDW: 14 % (ref 11.5–15.5)
WBC: 7.2 10*3/uL (ref 4.0–10.5)

## 2022-05-25 NOTE — Telephone Encounter (Signed)
Initiated a Prior Authorization request to [employer] for [place] via CoverMyMeds. Will complete and submit once lab results have been finalized and OV note has been signed.   Key: [id number]

## 2022-05-27 ENCOUNTER — Other Ambulatory Visit (HOSPITAL_COMMUNITY): Payer: Self-pay

## 2022-05-30 NOTE — Telephone Encounter (Signed)
Clinicals submitted to [facility] for [place]  Key: [id number]

## 2022-06-01 ENCOUNTER — Other Ambulatory Visit (HOSPITAL_COMMUNITY): Payer: Self-pay

## 2022-06-01 NOTE — Telephone Encounter (Signed)
ATC pt, LVM requesting return call. Direct number provided.

## 2022-06-01 NOTE — Telephone Encounter (Signed)
Received notification from [facility] regarding a prior authorization for [name] [name]. Authorization has been APPROVED from 05/30/2022 through 05/29/2023.  NOTE: Starter kit included in authorization  Authorization # [id number]  Attempted to run test claim however test claim states that patient's coverage terminated on 05/26/22. Pharmacy team will need to reach out to the patient to determine updated insurance information  Knox Saliva, PharmD, MPH, BCPS, CPP Clinical Pharmacist (Rheumatology and Pulmonology)

## 2022-06-02 NOTE — Telephone Encounter (Signed)
Patient states her new coverage starts on 06/26/22 (likely will be BCBS). She has not elected to receive COBRA coverage for the month. She is unsure if she wants to pay $1200 for one month of COBRA. She is still discussing with husband.  Last dose of Nucala was on 05/25/22 so she wouldn't be able to start [place] until on or after 06/22/22.  She will send new insurance information once received. MyChart message sent to patient.  Knox Saliva, PharmD, MPH, BCPS, CPP Clinical Pharmacist (Rheumatology and Pulmonology)

## 2022-06-17 ENCOUNTER — Other Ambulatory Visit (HOSPITAL_COMMUNITY): Payer: Self-pay

## 2022-06-20 ENCOUNTER — Other Ambulatory Visit (HOSPITAL_COMMUNITY): Payer: Self-pay

## 2022-06-21 ENCOUNTER — Encounter: Payer: Self-pay | Admitting: Specialist

## 2022-06-27 ENCOUNTER — Other Ambulatory Visit (HOSPITAL_COMMUNITY): Payer: Self-pay

## 2022-06-27 ENCOUNTER — Telehealth: Payer: Self-pay | Admitting: Specialist

## 2022-06-27 NOTE — Telephone Encounter (Signed)
Patient now has active insurance. BIN: [id number] PCN: [id number] Group: [id number] ID: [id number]  Please run Dupixent BIV through new insurance  Knox Saliva, PharmD, MPH, BCPS, CPP Clinical Pharmacist (Rheumatology and Pulmonology)

## 2022-06-27 NOTE — Telephone Encounter (Signed)
Received call from [facility] @ [facility] office. Patient has appt. There this am and needs ov notes faxed. I faxed last 3 ov notes [phone]. She will let me know if more notes are needed. Her ph [phone]

## 2022-06-27 NOTE — Telephone Encounter (Signed)
PA started in CoverMyMeds for loading dose Dupixent '300MG'$ /2ML pen-injectors.   Awaiting determination.   KeyLarey Days PA Case ID: [id number]

## 2022-06-30 ENCOUNTER — Other Ambulatory Visit (HOSPITAL_COMMUNITY): Payer: Self-pay

## 2022-06-30 NOTE — Telephone Encounter (Signed)
Received notification from [facility] regarding a prior authorization for [place]. Authorization has been APPROVED from 06/29/22 to 12/29/22.   Unable to run test claim because patient must fill through [facility] [place] #  [phone]  Enrolled patient into copay card but is emailed to her. Will have to call patient to collect this or collect at new start visit.  Knox Saliva, PharmD, MPH, BCPS, CPP Clinical Pharmacist (Rheumatology and Pulmonology)

## 2022-06-30 NOTE — Telephone Encounter (Signed)
ATC patient to schedule Dupixent new start. Unable to reach. Left VM requesting return call  Tya Haughey, PharmD, MPH, BCPS, CPP Clinical Pharmacist (Rheumatology and Pulmonology) 

## 2022-07-05 NOTE — Telephone Encounter (Signed)
Dupixent new start appointment scheduled for 07/18/22. Will use sample. Awaiting sample supply  Knox Saliva, PharmD, MPH, BCPS, CPP Clinical Pharmacist (Rheumatology and Pulmonology)

## 2022-07-07 ENCOUNTER — Encounter (INDEPENDENT_AMBULATORY_CARE_PROVIDER_SITE_OTHER): Payer: BLUE CROSS/BLUE SHIELD | Admitting: Internal Medicine

## 2022-07-08 ENCOUNTER — Other Ambulatory Visit (HOSPITAL_COMMUNITY): Payer: Self-pay

## 2022-07-11 ENCOUNTER — Other Ambulatory Visit (HOSPITAL_COMMUNITY): Payer: Self-pay

## 2022-07-15 NOTE — Patient Instructions (Signed)
Your next [place] dose is due on 08/01/22, 08/15/22, and every 14 days thereafter  {[email or web address]}  Your prescription will be shipped from [facility]. Their phone number is *** Please call to schedule shipment and confirm address. They will mail your medication to your home.  Your copay should be affordable. If you call the pharmacy and it is not affordable, please double-check that they are billing through your copay card as secondary coverage. That copay card information is: ID: *** BIN: *** PCN: *** Group Number: ***  You will need to be seen by your provider in 3 to 4 months to assess how [place] is working for you. Please ensure you have a follow-up appointment scheduled in January or February 2024. Call our clinic if you need to make this appointment.  How to manage an injection site reaction: Remember the 5 C's: COUNTER - leave on the counter at least 30 minutes but up to overnight to bring medication to room temperature. This may help prevent stinging COLD - place something cold (like an ice gel pack or cold water bottle) on the injection site just before cleansing with alcohol. This may help reduce pain CLARITIN - use Claritin (generic name is loratadine) for the first two weeks of treatment or the day of, the day before, and the day after injecting. This will help to minimize injection site reactions CORTISONE CREAM - apply if injection site is irritated and itching CALL ME - if injection site reaction is bigger than the size of your fist, looks infected, blisters, or if you develop hives

## 2022-07-15 NOTE — Progress Notes (Unsigned)
HPI Patient presents today to [facility] to see pharmacy team for [place] new start.  Past medical history includes severe persistent asthma, adrenal insufficiency, allergic rhinitis, GERD, HTN  She last saw Dr. Loanne Drilling on 05/24/22 with increase in ACT score.  She started [place] in June 2020. Her last dose of Nucala was on 05/25/22. She lost insurance in September 2023 therefore there has been some delay with transitioning her biologic treatment to [place].  Today, she reports feeling worsening of asthma but does not want to have to depend on prednisone during this time of year  Respiratory Medications Current regimen: Breztri 160-9-4.8 mc g (2 puffs twice daily), montelukast '10mg'$  nightly  Patient reports no known adherence challenges  OBJECTIVE Allergies  Allergen Reactions   Amoxicillin Anaphylaxis, Hives and Itching   Black [place] Pollen Allergy Skin Test Anaphylaxis   Penicillins Anaphylaxis    Has patient had a PCN reaction causing immediate rash, facial/tongue/throat swelling, SOB or lightheadedness with hypotension: Yes Has patient had a PCN reaction causing severe rash involving mucus membranes or skin necrosis: No Has patient had a PCN reaction that required hospitalization Yes Has patient had a PCN reaction occurring within the last 10 years: No If all of the above answers are "NO", then may proceed with Cephalosporin use.    Sulfa Antibiotics Hives and Other (See Comments)    Hives, mouth blisters    Chloroquine Other (See Comments)    Renal dysfunction   Ciprofloxacin Hives   Phenergan [Promethazine Hcl] Other (See Comments)    Caused "severe drop in blood pressure."   Sulfamethoxazole Hives   Other Other (See Comments)    From allergy testing--Canteloupe   Shellfish Allergy Itching and Rash   Vancomycin Cross Reactors Other (See Comments)    "RED MAN SYNDROME"  Pt stated she had no reaction to Vancomycin when it is given slow    Outpatient Encounter  Medications as of 07/18/2022  Medication Sig Note   ADDERALL XR 20 MG 24 hr capsule Take 20 mg by mouth daily as needed (concentration). 12/01/2016: Only takes when in school or writing papers   albuterol (PROVENTIL) (2.5 MG/3ML) 0.083% nebulizer solution Take 3 mLs (2.5 mg total) by nebulization every 6 (six) hours as needed for wheezing or shortness of breath.    albuterol (VENTOLIN HFA) 108 (90 Base) MCG/ACT inhaler Inhale 2 puffs into the lungs every 6 (six) hours as needed for wheezing or shortness of breath.    Budeson-Glycopyrrol-Formoterol (BREZTRI AEROSPHERE) 160-9-4.8 MCG/ACT AERO Inhale 2 puffs into the lungs in the morning and at bedtime.    EPINEPHrine 0.3 mg/0.3 mL IJ SOAJ injection Inject 0.3 mg into the muscle as needed for anaphylaxis.    estradiol (CLIMARA - DOSED IN MG/24 HR) 0.0375 mg/24hr patch Place 0.0375 mg onto the skin once a week. saturdays    HYDROcodone bit-homatropine (HYDROMET) 5-1.5 MG/5ML syrup Take 5 mLs by mouth at bedtime as needed for cough. (Patient not taking: Reported on 05/24/2022)    Mepolizumab (NUCALA) 100 MG/ML SOAJ INJECT 1 ML (100 MG TOTAL) INTO THE SKIN EVERY 28 (TWENTY-EIGHT) DAYS. DELIVER TO PATIENT'S HOME FOR SELF-ADMINISTRATION    montelukast (SINGULAIR) 10 MG tablet TAKE 1 TABLET BY MOUTH EVERYDAY AT BEDTIME    OVER THE COUNTER MEDICATION Take 6 tablets by mouth daily. Smarty Pants Vitamins    pregabalin (LYRICA) 50 MG capsule TAKE ONE CAPSULE BY MOUTH TWICE A DAY    PROAIR HFA 108 (90 Base) MCG/ACT inhaler Inhale 2 puffs into  the lungs every 6 (six) hours as needed for wheezing or shortness of breath.    Probiotic CAPS Take 1 capsule by mouth daily.     Respiratory Therapy Supplies (FLUTTER) DEVI Use flutter valve 3 times a day (Patient not taking: Reported on 05/24/2022)    rosuvastatin (CRESTOR) 20 MG tablet Take 20 mg by mouth daily.    sertraline (ZOLOFT) 50 MG tablet Take 150 mg by mouth daily.    telmisartan (MICARDIS) 40 MG tablet Take 40 mg  by mouth daily.    topiramate (TOPAMAX) 50 MG tablet Take 150 mg by mouth daily.    valACYclovir (VALTREX) 1000 MG tablet Take two tablets as a single dose prn cold sore. (Patient taking differently: Take 2,000 mg by mouth daily as needed. Take two tablets as a single dose prn cold sore.)    Facility-Administered Encounter Medications as of 07/18/2022  Medication   Mepolizumab SOLR 100 mg   Mepolizumab SOLR 100 mg   Mepolizumab SOLR 100 mg   Mepolizumab SOLR 100 mg   Mepolizumab SOLR 100 mg   Mepolizumab SOLR 100 mg   Mepolizumab SOLR 100 mg   Mepolizumab SOLR 100 mg   Mepolizumab SOLR 100 mg   Mepolizumab SOLR 100 mg   Mepolizumab SOLR 100 mg     Immunization History  Administered Date(s) Administered   Influenza, Quadrivalent, Recombinant, Inj, Pf 05/27/2020, 06/03/2021   Influenza,inj,Quad PF,6+ Mos 07/01/2016, 07/14/2017, 05/27/2019, 05/27/2020   PFIZER(Purple Top)SARS-COV-2 Vaccination 12/12/2019, 01/06/2020, 07/21/2020, 09/25/2020, 04/14/2021   Pneumococcal Polysaccharide-23 06/24/2020, 05/17/2022   Td 06/18/2013   Tdap 06/27/2011, 07/14/2014   Zoster Recombinat (Shingrix) 02/23/2021, 04/14/2021     PFTs    Latest Ref Rng & Units 08/07/2018    9:45 AM 07/25/2018   12:02 PM  PFT Results  FVC-Pre L 3.53  3.61   FVC-Predicted Pre % 84  86   FVC-Post L 3.10  3.07   FVC-Predicted Post % 74  73   Pre FEV1/FVC % % 82  82   Post FEV1/FCV % % 86  88   FEV1-Pre L 2.88  2.97   FEV1-Predicted Pre % 87  90   FEV1-Post L 2.67  2.69   DLCO uncorrected ml/min/mmHg  22.39   DLCO UNC% %  72   DLVA Predicted %  86   TLC L  6.18   TLC % Predicted %  106   RV % Predicted %  132      Eosinophils Most recent blood eosinophil count was <50 cells/microL taken on 05/24/22 .   IgE: 6 on 05/24/22   Assessment   Biologics training for dupilumab (Dupixent)  Goals of therapy: Mechanism: human monoclonal IgG4 antibody that inhibits interleukin-4 and interleukin-13 cytokine-induced  responses, including release of proinflammatory cytokines, chemokines, and IgE Reviewed that Dupixent is add-on medication and patient must continue maintenance inhaler regimen. Response to therapy: may take 4 months to determine efficacy. Discussed that patients generally feel improvement sooner than 4 months.  Side effects: injection site reaction (6-18%), antibody development (5-16%), ophthalmic conjunctivitis (2-16%), transient blood eosinophilia (1-2%)  Dose: '600mg'$  at Week 0 (administered today in clinic) followed by '300mg'$  every 14 days thereafter  Administration/Storage:  Reviewed administration sites of thigh or abdomen (at least 2-3 inches away from abdomen). Reviewed the upper arm is only appropriate if caregiver is administering injection  Do not shake pen/syringe as this could lead to product foaming or precipitation. Do not use if solution is discolored or contains particulate matter or  if window on prefilled pen is yellow (indicates pen has been used).  Reviewed storage of medication in refrigerator. Reviewed that [place] can be stored at room temperature in unopened carton for up to 14 days.  Access: Approval of Dupixent through: insurance Patient enrolled into copay card program to help with copay assistance.  Patient self-administered Dupixent '300mg'$ /36m x 2 (total dose '600mg'$ ) in right upper thigh and left upper thigh using sample Dupixent '300mg'$ /242mautoinjector pen  NDC: [id number]: [id number]: 07/26/2024  Patient monitored for 30 minutes for adverse reaction.  Patient tolerated without issue. Injection site checked. Patient denies itchiness and irritation. Provider does not note redness or swelling.  Medication Reconciliation  A drug regimen assessment was performed, including review of allergies, interactions, disease-state management, dosing and immunization history. Medications were reviewed with the patient, including name, instructions, indication,  goals of therapy, potential side effects, importance of adherence, and safe use.  Drug interaction(s): none note  Patient advised to notify PCP that she has discontinue telmisartan due to GI side effects  PLAN Continue Dupixent '300mg'$  every 14 days.  Next dose is due 08/01/22 and every 14 days thereafter. Rx sent to: [facility]: [phone] Patient provided with pharmacy phone number and advised to call later this week to schedule shipment to home. Patient provided with copay card information to provide to pharmacy if quoted copay exceeds $5 per month. Continue maintenance asthma regimen of:  Breztri 160-9-4.8 mc g (2 puffs twice daily), montelukast '10mg'$  nightly  All questions encouraged and answered.  Instructed patient to reach out with any further questions or concerns.  Thank you for allowing pharmacy to participate in this patient's care.  This appointment required 60 minutes of patient care (this includes precharting, chart review, review of results, face-to-face care, etc.).  DeKnox SalivaPharmD, MPH, BCPS, CPP Clinical Pharmacist (Rheumatology and Pulmonology)

## 2022-07-18 ENCOUNTER — Ambulatory Visit: Payer: 59 | Admitting: Pharmacist

## 2022-07-18 ENCOUNTER — Other Ambulatory Visit (HOSPITAL_COMMUNITY): Payer: Self-pay

## 2022-07-18 DIAGNOSIS — J4550 Severe persistent asthma, uncomplicated: Secondary | ICD-10-CM

## 2022-07-18 DIAGNOSIS — Z7189 Other specified counseling: Secondary | ICD-10-CM

## 2022-07-18 MED ORDER — DUPIXENT 300 MG/2ML ~~LOC~~ SOAJ: 300.0000 mg | SUBCUTANEOUS | 1 refills | Status: DC

## 2022-07-18 MED ORDER — EPINEPHRINE 0.3 MG/0.3ML IJ SOAJ: 0.3000 mg | INTRAMUSCULAR | 2 refills | Status: DC | PRN

## 2022-07-18 NOTE — Telephone Encounter (Signed)
Patient brought Dupixent cop[ay card information to new start visit today 07/18/22: BIN: [id number] PCN: [id number] Group: [id number] ID: [id number]  Copay card phone: [phone]  Knox Saliva, PharmD, MPH, BCPS, CPP Clinical Pharmacist (Rheumatology and Pulmonology)

## 2022-07-20 ENCOUNTER — Encounter (INDEPENDENT_AMBULATORY_CARE_PROVIDER_SITE_OTHER): Payer: Self-pay | Admitting: Internal Medicine

## 2022-07-20 ENCOUNTER — Ambulatory Visit (INDEPENDENT_AMBULATORY_CARE_PROVIDER_SITE_OTHER): Payer: 59 | Admitting: Internal Medicine

## 2022-07-20 VITALS — BP 142/84 | HR 85 | Temp 98.5°F | Ht 68.0 in | Wt 225.2 lb

## 2022-07-20 DIAGNOSIS — Z86018 Personal history of other benign neoplasm: Secondary | ICD-10-CM

## 2022-07-20 DIAGNOSIS — Z6834 Body mass index (BMI) 34.0-34.9, adult: Secondary | ICD-10-CM | POA: Diagnosis not present

## 2022-07-20 DIAGNOSIS — E785 Hyperlipidemia, unspecified: Secondary | ICD-10-CM

## 2022-07-20 DIAGNOSIS — E669 Obesity, unspecified: Secondary | ICD-10-CM | POA: Diagnosis not present

## 2022-07-20 DIAGNOSIS — I10 Essential (primary) hypertension: Secondary | ICD-10-CM | POA: Diagnosis not present

## 2022-07-20 DIAGNOSIS — Z2089 Contact with and (suspected) exposure to other communicable diseases: Secondary | ICD-10-CM

## 2022-07-20 NOTE — Progress Notes (Signed)
Office: [phone]  /  Fax: [phone]   Initial Visit  [name] [name] was seen in clinic today to evaluate for obesity. She is interested in losing weight to improve overall health and reduce the risk of weight related complications. She presents today to review program treatment options, initial physical assessment, and evaluation.   She was referred by Dr. Dagmar Hait.  She reports starting to gaining weight around 2013.  She reports being exposed to steroids via inhalers as well as as part of injections for her back problems.  She also had a pituitary adenoma in the past and had developed adrenal insufficiency.  She is no longer requiring mineralocorticoid replacement.  She is [employer].  She was referred by: PCP  When asked what else they would like to accomplish? She states: Improve existing medical conditions, Reduce number of medications, Improve quality of life, Improve appearance, and Improve self-confidence  When asked how has your weight affected you? She states: Contributed to medical problems, Having fatigue, and Having poor endurance  Some associated conditions: Hypertension, Arthritis, Hyperlipidemia, and Prediabetes  Contributing factors: Family history, Medications, Reduced physical activity, Pregnancy, and Menopause  Weight promoting medications identified: Steroids  Current nutrition plan: None  Current level of physical activity: None  Current or previous pharmacotherapy: Her primary care physician provided her with GLP-1 drug which she took for few weeks and found helpful.  She is on topiramate for migraines.  Response to medication: Never tried medications   Past medical history includes:   Past Medical History:  Diagnosis Date   Adrenal gland hypofunction (HCC)    related to post op, vancomycin & lumbar injection, steroids     Arthritis    hnp-lumbar    Blood dyscrasia    hx probable blood clot after ankle surgery  not definitive but  treated per pt   Cancer ([place])    "early evolvng melanoma "- legs    Depression    Diabetes insipidus ([place])    Family history of adverse reaction to anesthesia    mom has N/V - zofran works   Heart murmur    years ago-echo 05 no problems   High cholesterol    History of blood transfusion    "16 so far" (07/25/2012)   History of bronchitis    History of UTI    Hypertension    no med for 1 yr   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 dvt 2011 tx as preventive although did not show on scan after ankle surgery   Pneumonia ~ 2009; 2010   PONV (postoperative nausea and vomiting)    last 2 surgeries less nausea   PONV (postoperative nausea and vomiting)    none with last surgery-be extra careful with nose patient haas septal hole      Objective:   BP (!) 142/84   Pulse 85   Temp 98.5 F (36.9 C)   Ht '5\' 8"'$  (1.727 m)   Wt 225 lb 3.2 oz (102.2 kg)   SpO2 97%   BMI 34.24 kg/m  She was weighed on the bioimpedance scale: Body mass index is 34.24 kg/m.  Peak Weight: 248,Visceral Fat Rating: 12, Body Fat%: 45.2, Weight trend over the last 12 months: Decreasing  General:  Alert, oriented and cooperative. Patient is in no acute distress.  Respiratory: Normal respiratory effort, no problems with respiration noted  Extremities: Normal range of motion.  Mental Status: Normal mood and affect. Normal behavior. Normal judgment and thought content.   Assessment and Plan:  1. Class 1 obesity with serious comorbidity and body mass index (BMI) of 34.0 to 34.9 in adult, unspecified obesity type We reviewed weight, biometrics, associated medical conditions and contributing factors with patient. She would benefit from weight loss therapy via a modified calorie, low-carb, high-protein nutritional plan tailored to their REE (resting energy expenditure) which will be determined by indirect calorimetry.  We will also assess for cardiometabolic risk and  nutritional derangements via fasting serologies at her next appointment.  Patient has been exposed to chronic steroids for several years which may be contributing to her weight gain.  2. Hypertension, unspecified type Above target of less than 120/80.  She will continue on telmisartan.  We will work on blood pressure control via weight loss therapy.  3. Hyperlipidemia, unspecified hyperlipidemia type We do not have a recent lipid panel.  She is currently on rosuvastatin 20 mg a day without any symptoms of myopathy.  We will check fasting lipid panel with intake labs.  4. History of pituitary adenoma She had a history of a pituitary mass and adrenal insufficiency.  This may affect some neurohormonal pathways and may contribute to weight gain.      Obesity Treatment / Action Plan:  Patient will work on garnering support from family and friends to begin weight loss journey. Will work on eliminating or reducing the presence of highly palatable, calorie dense foods in the home. Will complete provided nutritional and psychosocial assessment questionnaire before the next appointment. Will be scheduled for indirect calorimetry to determine resting energy expenditure in a fasting state.  This will allow [place] to create a reduced calorie, high-protein meal plan to promote loss of fat mass while preserving muscle mass. Will think about ideas on how to incorporate physical activity into their daily routine. Was counseled on nutritional approaches to weight loss and benefits of complex carbs and high quality protein as part of nutritional weight management. Was counseled on pharmacotherapy and role as an adjunct in weight management.   Obesity Education Performed Today:  She was weighed on the bioimpedance scale and results were discussed and documented in the synopsis.  We discussed obesity as a disease and the importance of a more detailed evaluation of all the factors contributing to the disease.  We  discussed the importance of long term lifestyle changes which include nutrition, exercise and behavioral modifications as well as the importance of customizing this to her specific health and social needs.  We discussed the benefits of reaching a healthier weight to alleviate the symptoms of existing conditions and reduce the risks of the biomechanical, metabolic and psychological effects of obesity.  Tayah T Stamant appears to be in the action stage of change and states they are ready to start intensive lifestyle modifications and behavioral modifications.  30 minutes was spent today on this visit including the above counseling, pre-visit chart review, and post-visit documentation.  Reviewed by clinician on day of visit: allergies, medications, problem list, medical history, surgical history, family history, social history, and previous encounter notes.    I have reviewed the above documentation for accuracy and completeness, and I agree with the above.  Thomes Dinning, MD

## 2022-07-25 DIAGNOSIS — N951 Menopausal and female climacteric states: Secondary | ICD-10-CM | POA: Diagnosis not present

## 2022-07-25 DIAGNOSIS — R69 Illness, unspecified: Secondary | ICD-10-CM | POA: Diagnosis not present

## 2022-08-03 ENCOUNTER — Encounter (INDEPENDENT_AMBULATORY_CARE_PROVIDER_SITE_OTHER): Payer: Self-pay | Admitting: Internal Medicine

## 2022-08-03 ENCOUNTER — Ambulatory Visit (INDEPENDENT_AMBULATORY_CARE_PROVIDER_SITE_OTHER): Payer: 59 | Admitting: Internal Medicine

## 2022-08-03 VITALS — BP 123/83 | HR 95 | Temp 98.5°F | Ht 68.0 in | Wt 219.0 lb

## 2022-08-03 DIAGNOSIS — R5383 Other fatigue: Secondary | ICD-10-CM

## 2022-08-03 DIAGNOSIS — E559 Vitamin D deficiency, unspecified: Secondary | ICD-10-CM | POA: Diagnosis not present

## 2022-08-03 DIAGNOSIS — E669 Obesity, unspecified: Secondary | ICD-10-CM | POA: Diagnosis not present

## 2022-08-03 DIAGNOSIS — E7849 Other hyperlipidemia: Secondary | ICD-10-CM

## 2022-08-03 DIAGNOSIS — Z6834 Body mass index (BMI) 34.0-34.9, adult: Secondary | ICD-10-CM | POA: Diagnosis not present

## 2022-08-03 DIAGNOSIS — Z1331 Encounter for screening for depression: Secondary | ICD-10-CM

## 2022-08-03 DIAGNOSIS — R0602 Shortness of breath: Secondary | ICD-10-CM

## 2022-08-03 DIAGNOSIS — R7303 Prediabetes: Secondary | ICD-10-CM

## 2022-08-04 LAB — VITAMIN B12: Vitamin B-12: 274 pg/mL (ref 232–1245)

## 2022-08-04 LAB — VITAMIN D 25 HYDROXY (VIT D DEFICIENCY, FRACTURES): Vit D, 25-Hydroxy: 30.1 ng/mL (ref 30.0–100.0)

## 2022-08-04 LAB — INSULIN, RANDOM: INSULIN: 5.8 u[IU]/mL (ref 2.6–24.9)

## 2022-08-22 NOTE — Progress Notes (Unsigned)
Chief Complaint:   [name] [name] (MR# [id number]) is a 56 y.o. female who presents for evaluation and treatment of obesity and related comorbidities. Current BMI is Body mass index is 33.3 kg/m. [name] has been struggling with her weight for many years and has been unsuccessful in either losing weight, maintaining weight loss, or reaching her healthy weight goal.  [name] is currently in the action stage of change and ready to dedicate time achieving and maintaining a healthier weight. [name] is interested in becoming our patient and working on intensive lifestyle modifications including (but not limited to) diet and exercise for weight loss.  Pt had labs completed at PCP's office, but results are not available. [name] will bring copy to next appointment.  [name]'s habits were reviewed today and are as follows: Her family eats meals together, she thinks her family will eat healthier with her, her desired weight loss is 59 lbs, she has been heavy most of her life, she started gaining weight in 2018-2019, her heaviest weight ever was 247 pounds, she is a picky eater and doesn't like to eat healthier foods, she has significant food cravings issues, she snacks frequently in the evenings, she skips meals frequently, she is frequently drinking liquids with calories, she frequently makes poor food choices, she frequently eats larger portions than normal, and she struggles with emotional eating.  Depression Screen Lurae's Food and Mood (modified PHQ-9) score was 21.  Subjective:   1. Other fatigue [name] admits to daytime somnolence and denies waking up still tired. Patient has a history of symptoms of daytime fatigue. [name] generally gets 8 hours of sleep per night, and states that she has generally restful sleep. Snoring is not present. Apneic episodes are not present. [name] is 3.  Normal level CBC, CMP, TSH at [facility].  2. SOB (shortness of  breath) on exertion Denetra notes increasing shortness of breath with exercising and seems to be worsening over time with weight gain. She notes getting out of breath sooner with activity than she used to. This has gotten worse recently. [name] denies shortness of breath at rest or orthopnea.  3. Other hyperlipidemia Pt had fasting lipid panel done in August. Request results. She is on rosuvastatin.  4. Vitamin D deficiency By history. She is currently taking no vitamin D supplement. She denies nausea, vomiting or muscle weakness.  5. Prediabetes Discussed labs with patient today. A1c was 5.9 at [place] has a diagnosis of prediabetes based on her elevated HgA1c and was informed this puts her at greater risk of developing diabetes. She continues to work on diet and exercise to decrease her risk of diabetes. She denies nausea or hypoglycemia.  Assessment/Plan:   1. Other fatigue [name] does feel that her weight is causing her energy to be lower than it should be. Fatigue may be related to obesity, depression or many other causes. Labs will be ordered, and in the meanwhile, [name] will focus on self care including making healthy food choices, increasing physical activity and focusing on stress reduction.  Lab/Orders today or future: - EKG 12-Lead - Insulin, random - VITAMIN D 25 Hydroxy (Vit-D Deficiency, Fractures) - Vitamin B12  2. SOB (shortness of breath) on exertion [name] does feel that she gets out of breath more easily that she used to when she exercises. [name]'s shortness of breath appears to be obesity related and exercise induced. She has agreed to work on weight loss and gradually increase exercise  to treat her exercise induced shortness of breath. Will continue to monitor closely.  3. Other hyperlipidemia Continue statin therapy and request records. Continue weight loss therapy.  4. Vitamin D deficiency Check Vit D level.  5. Prediabetes Weight loss  therapy. Check insulin levels.  - Insulin, random  6. Depression screen [name] had a positive depression screening. Depression is commonly associated with obesity and often results in emotional eating behaviors. We will monitor this closely and work on CBT to help improve the non-hunger eating patterns. Referral to Psychology may be required if no improvement is seen as she continues in our clinic.  7. Class 1 obesity with serious comorbidity and body mass index (BMI) of 34.0 to 34.9 in adult, unspecified obesity type Lab/Orders today or future: - VITAMIN D 25 Hydroxy (Vit-D Deficiency, Fractures)  [name] is currently in the action stage of change and her goal is to continue with weight loss efforts. I recommend [name] begin the structured treatment plan as follows:  She has agreed to keeping a food journal and adhering to recommended goals of 1200 calories and 85 grams protein. Pt instructed on use of MyFitnessPal.  Limited macronutrient sources. Pt does not favor fruits or vegetables. She is also on anorexiant medications including: Topiramate, bupropion, and Adderall, which may be affecting her appetite and desire to eat.  Exercise goals: All adults should avoid inactivity. Some physical activity is better than none, and adults who participate in any amount of physical activity gain some health benefits.   Behavioral modification strategies: increasing lean protein intake, decreasing simple carbohydrates, increasing vegetables, increasing water intake, increasing high fiber foods, no skipping meals, meal planning and cooking strategies, and keeping a strict food journal.  She was informed of the importance of frequent follow-up visits to maximize her success with intensive lifestyle modifications for her multiple health conditions. She was informed we would discuss her lab results at her next visit unless there is a critical issue that needs to be addressed sooner. [name] agreed to keep  her next visit at the agreed upon time to discuss these results.  Objective:   Blood pressure 123/83, pulse 95, temperature 98.5 F (36.9 C), height '5\' 8"'$  (1.727 m), weight 219 lb (99.3 kg), SpO2 97 %. Body mass index is 33.3 kg/m.  EKG: Normal sinus rhythm, rate 85.  Indirect Calorimeter completed today shows a VO2 of 235 and a REE of 1627.  Her calculated basal metabolic rate is 1517 thus her basal metabolic rate is worse than expected.  General: Cooperative, alert, well developed, in no acute distress. HEENT: Conjunctivae and lids unremarkable. Cardiovascular: Regular rhythm.  Lungs: Normal work of breathing. Neurologic: No focal deficits.   Lab Results  Component Value Date   CREATININE 0.96 02/09/2022   BUN 23 02/09/2022   NA 139 02/09/2022   K 4.4 02/09/2022   CL 105 02/09/2022   CO2 28 02/09/2022   Lab Results  Component Value Date   ALT 16 12/02/2016   AST 20 12/02/2016   ALKPHOS 96 12/02/2016   BILITOT 0.5 12/02/2016   Lab Results  Component Value Date   HGBA1C 5.9 02/09/2022   HGBA1C 5.2 12/06/2016   Lab Results  Component Value Date   INSULIN 5.8 08/03/2022   No results found for: "TSH" No results found for: "CHOL", "HDL", "LDLCALC", "LDLDIRECT", "TRIG", "CHOLHDL" Lab Results  Component Value Date   WBC 7.2 05/24/2022   HGB 13.2 05/24/2022   HCT 41.0 05/24/2022   MCV 94.0 05/24/2022  PLT 238.0 05/24/2022    Attestation Statements:   Reviewed by clinician on day of visit: allergies, medications, problem list, medical history, surgical history, family history, social history, and previous encounter notes.  Time spent on visit including pre-visit chart review and post-visit charting and care was 40 minutes.   I, Kathlene November, BS, CMA, am acting as transcriptionist for Thomes Dinning, MD.  I have reviewed the above documentation for accuracy and completeness, and I agree with the above. -Thomes Dinning, MD

## 2022-08-24 ENCOUNTER — Ambulatory Visit (INDEPENDENT_AMBULATORY_CARE_PROVIDER_SITE_OTHER): Payer: 59 | Admitting: Internal Medicine

## 2022-08-24 ENCOUNTER — Encounter (INDEPENDENT_AMBULATORY_CARE_PROVIDER_SITE_OTHER): Payer: Self-pay | Admitting: Internal Medicine

## 2022-08-24 VITALS — BP 130/82 | HR 98 | Temp 97.6°F | Ht 68.0 in | Wt 219.0 lb

## 2022-08-24 DIAGNOSIS — E669 Obesity, unspecified: Secondary | ICD-10-CM | POA: Diagnosis not present

## 2022-08-24 DIAGNOSIS — Z6833 Body mass index (BMI) 33.0-33.9, adult: Secondary | ICD-10-CM | POA: Diagnosis not present

## 2022-08-24 DIAGNOSIS — F509 Eating disorder, unspecified: Secondary | ICD-10-CM | POA: Diagnosis not present

## 2022-08-24 DIAGNOSIS — R69 Illness, unspecified: Secondary | ICD-10-CM | POA: Diagnosis not present

## 2022-08-29 DIAGNOSIS — M5416 Radiculopathy, lumbar region: Secondary | ICD-10-CM | POA: Diagnosis not present

## 2022-08-29 DIAGNOSIS — M4326 Fusion of spine, lumbar region: Secondary | ICD-10-CM | POA: Diagnosis not present

## 2022-09-05 ENCOUNTER — Telehealth: Payer: Self-pay | Admitting: Pulmonary Disease

## 2022-09-05 NOTE — Telephone Encounter (Signed)
Pls call re: question on [employer] (SP?) [phone]. TY

## 2022-09-06 NOTE — Progress Notes (Signed)
Chief Complaint:   OBESITY [name] [name] is here to discuss her progress with her obesity treatment plan along with follow-up of her obesity related diagnoses. [name] [name] is on keeping a food journal and adhering to recommended goals of 1200 calories and 85 grams of protein and states she is following her eating plan approximately 50% of the time. [name] [name] states she is walking on the treadmill for 1.5 miles 3 times per week.    Today's visit was #: 2 Starting weight: 219 lbs Starting date: 08/03/2022 Today's weight: 219 lbs Today's date: 08/24/2022 Total lbs lost to date: 0 Total lbs lost since last in-office visit: 0  Interim History: [name] [name] shared with me today her past traumatic experiences during her childhood and teenage years, involving body shaming and problems with self-image which resulted in compensatory mechanisms including: purging, laxative abuse, and restrictive eating. These behaviors are still present.  She is followed by [facility] but has not shared the extent of her eating disorder.  Subjective:   1. Eating disorder, unspecified type [name] [name] has restrictive eating patterns due to problems with self-image and past traumatic events.   Assessment/Plan:   1. Eating disorder, unspecified type [name] [name] will coordinate care with Almetta Lovely at [facility] for intensive cognitive behavioral therapy. Restricting calories are not indicated at present time.  She will make an effort to try to get at least 1200 cal a day, macronutrient composition not important at present time, as there are certain foods that trigger emotions linked to traumatic events in the past.    We spent 40 minutes, 100% of the time face-to-face on counseling, providing patient support and coordinating care.  2. Obesity, current BMI 33.4 [name] [name] is currently in the action stage of change. As such, her goal is to continue with weight loss efforts. She was provided a self-created meal plan  target of keeping a food journal and adhering to recommended goals of 1200 calories and 85 grams of protein daily.   I discussed with the patient to consider counseling with her behavioral health specialist. She recommended external referral for intensive cognitive behavioral therapy. She is followed by Psychiatry and I will reach out if she allows.   Exercise goals: As is.   Behavioral modification strategies: increasing lean protein intake, increasing water intake, and no skipping meals.  [name] [name] has agreed to follow-up with our clinic in 2 to 3 weeks. She was informed of the importance of frequent follow-up visits to maximize her success with intensive lifestyle modifications for her multiple health conditions.   Objective:   Blood pressure 130/82, pulse 98, temperature 97.6 F (36.4 C), height '5\' 8"'$  (1.727 m), weight 219 lb (99.3 kg), SpO2 98 %. Body mass index is 33.3 kg/m.  General: Cooperative, alert, well developed, in no acute distress. HEENT: Conjunctivae and lids unremarkable. Cardiovascular: Regular rhythm.  Lungs: Normal work of breathing. Neurologic: No focal deficits.   Lab Results  Component Value Date   CREATININE 0.96 02/09/2022   BUN 23 02/09/2022   NA 139 02/09/2022   K 4.4 02/09/2022   CL 105 02/09/2022   CO2 28 02/09/2022   Lab Results  Component Value Date   ALT 16 12/02/2016   AST 20 12/02/2016   ALKPHOS 96 12/02/2016   BILITOT 0.5 12/02/2016   Lab Results  Component Value Date   HGBA1C 5.9 02/09/2022   HGBA1C 5.2 12/06/2016   Lab Results  Component Value Date   INSULIN 5.8 08/03/2022   No results  found for: "TSH" No results found for: "CHOL", "HDL", "[place]", "LDLDIRECT", "TRIG", "CHOLHDL" Lab Results  Component Value Date   VD25OH 30.1 08/03/2022   Lab Results  Component Value Date   WBC 7.2 05/24/2022   HGB 13.2 05/24/2022   HCT 41.0 05/24/2022   MCV 94.0 05/24/2022   PLT 238.0 05/24/2022   No results found for: "IRON", "TIBC",  "FERRITIN"  Attestation Statements:   Reviewed by clinician on day of visit: allergies, medications, problem list, medical history, surgical history, family history, social history, and previous encounter notes.  Time spent on visit including pre-visit chart review and post-visit care and charting was 40 minutes.   Wilhemena Durie, am acting as transcriptionist for Thomes Dinning, MD.  I have reviewed the above documentation for accuracy and completeness, and I agree with the above. -Thomes Dinning, MD

## 2022-09-07 ENCOUNTER — Encounter (INDEPENDENT_AMBULATORY_CARE_PROVIDER_SITE_OTHER): Payer: Self-pay | Admitting: Internal Medicine

## 2022-09-07 ENCOUNTER — Ambulatory Visit (INDEPENDENT_AMBULATORY_CARE_PROVIDER_SITE_OTHER): Payer: 59 | Admitting: Internal Medicine

## 2022-09-07 VITALS — BP 136/82 | HR 101 | Temp 97.7°F | Ht 68.0 in | Wt 219.0 lb

## 2022-09-07 DIAGNOSIS — E669 Obesity, unspecified: Secondary | ICD-10-CM

## 2022-09-07 DIAGNOSIS — F900 Attention-deficit hyperactivity disorder, predominantly inattentive type: Secondary | ICD-10-CM | POA: Diagnosis not present

## 2022-09-07 DIAGNOSIS — F4311 Post-traumatic stress disorder, acute: Secondary | ICD-10-CM | POA: Diagnosis not present

## 2022-09-07 DIAGNOSIS — F509 Eating disorder, unspecified: Secondary | ICD-10-CM

## 2022-09-07 DIAGNOSIS — R69 Illness, unspecified: Secondary | ICD-10-CM | POA: Diagnosis not present

## 2022-09-07 DIAGNOSIS — Z6833 Body mass index (BMI) 33.0-33.9, adult: Secondary | ICD-10-CM

## 2022-09-08 ENCOUNTER — Encounter (HOSPITAL_BASED_OUTPATIENT_CLINIC_OR_DEPARTMENT_OTHER): Payer: Self-pay | Admitting: Pulmonary Disease

## 2022-09-08 ENCOUNTER — Ambulatory Visit (INDEPENDENT_AMBULATORY_CARE_PROVIDER_SITE_OTHER): Payer: 59 | Admitting: Pulmonary Disease

## 2022-09-08 VITALS — BP 110/60 | HR 108 | Ht 68.0 in | Wt 225.0 lb

## 2022-09-08 DIAGNOSIS — J4551 Severe persistent asthma with (acute) exacerbation: Secondary | ICD-10-CM | POA: Diagnosis not present

## 2022-09-08 NOTE — Progress Notes (Signed)
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 Started on [place] on 03/16/2019.  Overall well-controlled since initiation of biologic.  She previously had 3 exacerbations a year.  While on Nucala only occurred at most once a year. After missing doses since the fall 2022 she has had worsening symptoms and restarted injections 11/2021.  05/24/22 She was last seen in April x 2 in Pulmonary clinic for exacerbation. Was stepped up to triple therapy. She continues to have periodic but persistent cough throughout the last 4 months. She started Ozempic and lost weight. She is walking daily on treadmill. She has been back on Nucala for several months and overall does not feel she has the same level of symptom control as she did in the past. She reports nasal congestion. Unable to nasal sprays due to hx sinus issues.She has been taking Breztri, Symbicort and Spiriva. She didn't realize she was supposed to stop symbicort and spiriva. She has air purifiers in every room to help with allergies.  09/08/22 Since our last visit she was started on [place] in October. Her symptoms have not been controlled and has been developing left facial flushing and rashes around the mouth and abdominal with use. Denies throat swelling, difficulty breathing or wheezing with use. Last injection was in November 14th and has had issues receiving further injections due [facility]. She reports coughing, wheezing and shortness of breath x 1 week,  using albuterol 8-9 times day.. Denies fevers, chills.   2019 Jan Feb Mar April May June July Aug Sept Oct Nov Dec            X X X  [id number] Jan Feb Mar April May June July Aug Sept Oct Nov Dec   X    X       X   2021 Jan Feb Mar April May June July Aug Sept Oct Nov Dec                2022 Jan Feb Mar April May June July Aug Sept Oct Nov Dec   X          X   2023 Jan Feb Mar April May June July Aug Sept Oct Nov Dec     XX XX    X    X   Asthma Control Test ACT Total Score  05/24/2022  3:28 PM 14  02/09/2022  9:06 AM 8  01/12/2022  9:09 AM 7   Social History: Non-smoker --She has had recent stressors. Her husband has been diagnosed with Parkinson.  Her father passed this winter in 2021. 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[place])    related to post op, vancomycin & lumbar injection, steroids     Anxiety    Arthritis    hnp-lumbar    Asthma    Back pain    Blood dyscrasia    hx probable blood clot  after ankle surgery  not definitive but treated per pt   Cancer ([place])    "early evolvng melanoma "- legs    Depression    Diabetes insipidus ([place])    Edema of both lower extremities    Family history of adverse reaction to anesthesia    mom has N/V - zofran works   Gallbladder problem    Heart murmur    years ago-echo 05 no problems   High cholesterol    History of blood transfusion    "16 so far" (07/25/2012)   History of bronchitis    History of UTI    Hypertension    no med for 1 yr   Malaria by plasmodium vivax 1999   Menopause    Migraines    Multiple food allergies    Peripheral vascular disease ([place])    ? dvt 2011 tx as preventive although did not show on scan after ankle surgery   Pneumonia ~ 2009; 2010   PONV (postoperative nausea and vomiting)    last 2 surgeries less nausea   PONV (postoperative nausea and vomiting)    none with last surgery-be extra  careful with nose patient haas septal hole    Vitamin D deficiency      Allergies  Allergen Reactions   Amoxicillin Anaphylaxis, Hives and Itching   [place] Pollen Allergy Skin Test Anaphylaxis   Penicillins Anaphylaxis    Has patient had a PCN reaction causing immediate rash, facial/tongue/throat swelling, SOB or lightheadedness with hypotension: Yes Has patient had a PCN reaction causing severe rash involving mucus membranes or skin necrosis: No Has patient had a PCN reaction that required hospitalization Yes Has patient had a PCN reaction occurring within the last 10 years: No If all of the above answers are "NO", then may proceed with Cephalosporin use.    Sulfa Antibiotics Hives and Other (See Comments)    Hives, mouth blisters    Chloroquine Other (See Comments)    Renal dysfunction   Ciprofloxacin Hives   Phenergan [Promethazine Hcl] Other (See Comments)    Caused "severe drop in blood pressure."   Sulfamethoxazole Hives   Other Other (See Comments)    From allergy testing--Canteloupe   Shellfish Allergy Itching and Rash   Vancomycin Cross Reactors Other (See Comments)    "RED MAN SYNDROME"  Pt stated she had no reaction to Vancomycin when it is given slow     Outpatient Medications Prior to Visit  Medication Sig Dispense Refill   ADDERALL XR 20 MG 24 hr capsule Take 20 mg by mouth daily as needed (concentration).  0   albuterol (PROVENTIL) (2.5 MG/3ML) 0.083% nebulizer solution Take 3 mLs (2.5 mg total) by nebulization every 6 (six) hours as needed for wheezing or shortness of breath. 75 mL 5   albuterol (VENTOLIN HFA) 108 (90 Base) MCG/ACT inhaler Inhale 2 puffs into the lungs every 6 (six) hours as needed for wheezing or shortness of breath. 8 g 2   aspirin EC 81 MG tablet Take 81 mg by mouth daily. Swallow whole.     Budeson-Glycopyrrol-Formoterol (BREZTRI AEROSPHERE) 160-9-4.8 MCG/ACT AERO Inhale 2 puffs into the lungs in the morning and at bedtime. 1 each 11    buPROPion (WELLBUTRIN XL) 300 MG 24 hr tablet Take 300 mg by mouth daily.     Dupilumab (DUPIXENT) 300 MG/2ML SOPN Inject 300 mg into the skin every 14 (fourteen) days. Loading dose of '600mg'$  completed in clinic 07/18/22 12 mL 1  EPINEPHrine 0.3 mg/0.3 mL IJ SOAJ injection Inject 0.3 mg into the muscle as needed for anaphylaxis. 2 each 2   estradiol (CLIMARA - DOSED IN MG/24 HR) 0.0375 mg/24hr patch Place 0.0375 mg onto the skin once a week. saturdays     montelukast (SINGULAIR) 10 MG tablet TAKE 1 TABLET BY MOUTH EVERYDAY AT BEDTIME 90 tablet 3   omeprazole (PRILOSEC) 20 MG capsule Take 20 mg by mouth daily.     OVER THE COUNTER MEDICATION Take 6 tablets by mouth daily. Smarty Pants Vitamins     PROAIR HFA 108 (90 Base) MCG/ACT inhaler Inhale 2 puffs into the lungs every 6 (six) hours as needed for wheezing or shortness of breath. 8 g 5   Probiotic CAPS Take 1 capsule by mouth daily.      Respiratory Therapy Supplies (FLUTTER) DEVI Use flutter valve 3 times a day 1 each 0   rosuvastatin (CRESTOR) 20 MG tablet Take 20 mg by mouth daily.     telmisartan (MICARDIS) 40 MG tablet Take 40 mg by mouth daily.     topiramate (TOPAMAX) 50 MG tablet Take 150 mg by mouth daily.     valACYclovir (VALTREX) 1000 MG tablet Take two tablets as a single dose prn cold sore. (Patient taking differently: Take 2,000 mg by mouth daily as needed. Take two tablets as a single dose prn cold sore.) 20 tablet 1   sertraline (ZOLOFT) 50 MG tablet Take 100 mg by mouth daily.     No facility-administered medications prior to visit.    Review of Systems  Constitutional:  Negative for chills, diaphoresis, fever, malaise/fatigue and weight loss.  HENT:  Positive for congestion.   Respiratory:  Positive for cough and shortness of breath. Negative for hemoptysis, sputum production and wheezing.   Cardiovascular:  Negative for chest pain, palpitations and leg swelling.     Objective:   Vitals:   09/08/22 1400  BP: 110/60   Pulse: (!) 108  SpO2: 100%  Weight: 225 lb (102.1 kg)  Height: '5\' 8"'$  (1.727 m)   SpO2: 100 % O2 Device: None (Room air)  Physical Exam: General: Well-appearing, no acute distress HENT: [place], AT Eyes: EOMI, no scleral icterus Respiratory: Diminished to auscultation bilaterally.  No crackles, wheezing or rales Cardiovascular: RRR, -M/R/G, no JVD Extremities:-Edema,-tenderness Neuro: AAO x4, CNII-XII grossly intact Psych: Normal mood, normal affect  Labs Eosinophil 252 (3.4%) 09/25/18  Chest imaging: CXR 09/25/2018-normal chest x-ray with clear lung fields without evidence of infiltrate, effusion or edema CXR 10/16/2020-no infiltrate or effusion or edema. CT abdomen/pelvis 07/14/2021-lower lung fields with no parenchymal abnormalities, effusion or edema.  Diverticulosis, small umbilical hernia. CXR 01/12/22- No acute abnormalities  PFT:  08/07/2018-Mild bronchial hyper reactivity suggestive of asthma    Assessment & Plan:  56 year old female with severe persistent asthma who presents for follow-up. Previously well-controlled on Nucala from 01/2019-08/2021. Due to insurance she had to pause but despite restart Nucala in 11/2021, she had breakthrough symptoms and exacerbations. Started on [place] in 06/2022 however symptoms remain uncontrolled and having possible drug-related rashes.   Discussed biologic agents including Xolair (anti-IgE), Nucala (anti-IL-5), Fasenra (anti-IL-5 receptor alpha) and Dupixent (anti-IL-4 receptor subunit alpha). After discussion will plan to discontinue Dupixent and enroll in [place].   Will message pharmacy for sample and drug monitoring while on Dupixent until she can receive Fasenra.  Severe Persistent Eosinophilic Asthma Asthma exacerbation --Recommend prednisone taper as noted below. --CONTINUE Breztri TWO puffs in morning and night. Rinse mouth out  after use --CONTINUE Albuterol as needed for shortness of breath or wheezing --With your next  [place], recommend coming to the office to monitor for adverse reaction --Will enroll in [place] as soon as possible  Asthma Action Plan IF you develop asthma symptoms (chest tightness, cough and wheezing), take an extra 2 puffs of Symbicort or Albuterol as needed every 4 hours. Call us for persistent symptoms steroids.  Immunization History  Administered Date(s) Administered   Influenza, Quadrivalent, Recombinant, Inj, Pf 05/27/2020, 06/03/2021   Influenza,inj,Quad PF,6+ Mos 07/01/2016, 07/14/2017, 05/27/2019, 05/27/2020   PFIZER(Purple Top)SARS-COV-2 Vaccination 12/12/2019, 01/06/2020, 07/21/2020, 09/25/2020, 04/14/2021   Pneumococcal Polysaccharide-23 06/24/2020, 05/17/2022   Td 06/18/2013   Tdap 06/27/2011, 07/14/2014   Zoster Recombinat (Shingrix) 02/23/2021, 04/14/2021   No orders of the defined types were placed in this encounter.  No orders of the defined types were placed in this encounter.   Return in about 3 months (around 12/08/2022).  I have spent a total time of 42-minutes on the day of the appointment including chart review, data review, collecting history, coordinating care and discussing medical diagnosis and plan with the patient/family. Past medical history, allergies, medications were reviewed. Pertinent imaging, labs and tests included in this note have been reviewed and interpreted independently by me.  Ahmon Tosi Rodman Pickle, MD [facility] 09/08/2022 2:46 PM

## 2022-09-08 NOTE — Progress Notes (Deleted)
[name] [name] presents today for follow-up.  This morning she was seen by her counselor and she shared with her restrictive eating behavior related to past traumatic experience.  She is now being referred to [facility] who specializes in eating disorders and has an appointment in January.  She states that she recently wanted to start restricting and using laxatives.  She also had bought highly palatable foods.  After much thought she changed her mind and through the food and laxatives away.  She is getting at least 1 meal and some snacks throughout the day.  She denies any purging.  She is working on establishing boundaries and assisting herself from her mother who is presents is a trigger for some of her restrictive eating behavior.  Plan: We do not recommend a reduced calorie meal plan at present time as she has a restrictive eating disorder and this may worsen that.  She will work with her counselor and eating disorder specialist to address triggers through intensive cognitive behavioral therapy.  I will be seeing her back in 4 weeks.  Patient assured me that she will not restrict and would at least try to get in at 1000 cal a day.  She is at worst to some foods because of past trauma.

## 2022-09-08 NOTE — Patient Instructions (Addendum)
  Severe Persistent Eosinophilic Asthma Asthma exacerbation --Recommend prednisone taper as noted below. --CONTINUE Breztri TWO puffs in morning and night. Rinse mouth out after use --CONTINUE Albuterol as needed for shortness of breath or wheezing --With your next Dupixent, recommend coming to the office to monitor for adverse reaction --Will enroll in [place] as soon as possible  Prednisone taper as follows:  40 mg x 2 days  30 mg x 2 days  20 mg x 2 days  10 mg x 2 day  Follow-up with me in 3 months

## 2022-09-09 ENCOUNTER — Encounter: Payer: Self-pay | Admitting: Pharmacist

## 2022-09-09 ENCOUNTER — Telehealth: Payer: Self-pay | Admitting: Pharmacist

## 2022-09-09 NOTE — Telephone Encounter (Signed)
Error

## 2022-09-09 NOTE — Telephone Encounter (Deleted)
-----   Message from [place], MD sent at 09/08/2022  2:38 PM EST ----- Patient is having ?reaction with Dupixent. Can we arrange for patient to have injection here in infusion clinic to monitor for next dose. She is having issues with getting it from her pharmacy due to [facility]. She is overdue. Can we use a sample to help her out?

## 2022-09-09 NOTE — Telephone Encounter (Deleted)
-----   Message from [place], MD sent at 09/08/2022  2:47 PM EST ----- Also planning to switch from [place] to [facility]. Will have patient fill out form and bring it to you if you can schedule a time for her to come for Dupixent drug monitoring. Please let me know if there are any issues. -JE

## 2022-09-09 NOTE — Telephone Encounter (Signed)
-----   Message from [place], MD sent at 09/08/2022  2:47 PM EST ----- Also planning to switch from [place] to [facility]. Will have patient fill out form and bring it to you if you can schedule a time for her to come for Dupixent drug monitoring. Please let me know if there are any issues. -JE

## 2022-09-09 NOTE — Telephone Encounter (Signed)
ATC patient to schedule Dupixent appt. Unable to reach. Left VM.   Will also start [place]. Submitted a Prior Authorization request to [facility] for [facility] via CoverMyMeds. Will update once we receive a response.  Key: Myrtis Hopping, PharmD, MPH, BCPS, CPP Clinical Pharmacist (Rheumatology and Pulmonology)

## 2022-09-09 NOTE — Telephone Encounter (Signed)
Received message from Dr. Loanne Drilling yesterday stating patient may be having reaction to [place]. We will complete next dose in clinic together as she is overdue and for 1 hour monitoring. Patient will be transitioning to [place].  Knox Saliva, PharmD, MPH, BCPS, CPP Clinical Pharmacist (Rheumatology and Pulmonology)

## 2022-09-09 NOTE — Progress Notes (Signed)
ATC patient to schedule Dupixent appt. Unable to reach. Left VM.  Will also start [place]  Knox Saliva, PharmD, MPH, BCPS, CPP Clinical Pharmacist (Rheumatology and Pulmonology)

## 2022-09-09 NOTE — Telephone Encounter (Signed)
-----   Message from [place], MD sent at 09/08/2022  2:38 PM EST ----- Patient is having ?reaction with Dupixent. Can we arrange for patient to have injection here in infusion clinic to monitor for next dose. She is having issues with getting it from her pharmacy due to [facility]. She is overdue. Can we use a sample to help her out?

## 2022-09-13 ENCOUNTER — Other Ambulatory Visit (HOSPITAL_COMMUNITY): Payer: Self-pay

## 2022-09-13 DIAGNOSIS — M545 Low back pain, unspecified: Secondary | ICD-10-CM | POA: Diagnosis not present

## 2022-09-13 NOTE — Telephone Encounter (Signed)
Received notification from [facility] regarding a prior authorization for [facility]. Authorization has been APPROVED from 09/09/22 to 03/11/23. Approval letter sent to scan center.  Unable to run test claim, because patient must fill through [place]: [id number]  Authorization #  [id number]  Enrolled patient into [place] card: RxBIN: [id number] PCN: [id number] Group: [id number] ID: [id number]  ATC patient to schedule Fasenra new start. Unable to reach. Left VM requesting return call.   Knox Saliva, PharmD, MPH, BCPS, CPP Clinical Pharmacist (Rheumatology and Pulmonology)

## 2022-09-14 NOTE — Telephone Encounter (Signed)
Received return call from patient. She is scheduled for Fasenra new start on Tuesday, 09/20/2022. Last dose of Dupixent was in November 2023. Will need to add Dupixent to allergy list.  Knox Saliva, PharmD, MPH, BCPS, CPP Clinical Pharmacist (Rheumatology and Pulmonology)

## 2022-09-16 DIAGNOSIS — M5416 Radiculopathy, lumbar region: Secondary | ICD-10-CM | POA: Diagnosis not present

## 2022-09-19 NOTE — Progress Notes (Unsigned)
Chief Complaint:   OBESITY Krishna is here to discuss her progress with her obesity treatment plan along with follow-up of her obesity related diagnoses. Sui is on keeping a food journal and adhering to recommended goals of 1200 calories and 85 grams protein and states she is following her eating plan approximately 0% of the time. Daylin states she is walking on treadmill 1.5 miles 3-4 times per week.  Today's visit was #: 3 Starting weight: 219 lbs Starting date: 08/03/2022 Today's weight: 219 lbs Today's date: 09/07/2022 Total lbs lost to date: 0 Total lbs lost since last in-office visit: 0  Interim History: Eeva presents today for follow-up.  This morning she was seen by her counselor and she shared with her restrictive eating behavior related to past traumatic experience.  She is now being referred to [facility] who specializes in eating disorders and has an appointment in January.  She states that she recently wanted to start restricting and using laxatives.  She also had bought highly palatable foods.  After much thought she changed her mind and through the food and laxatives away.  She is getting at least 1 meal and some snacks throughout the day.  She denies any purging.  She is working on establishing boundaries and assisting herself from her mother who is presents is a trigger for some of her restrictive eating behavior.    Subjective:   1. Eating disorder, unspecified type Improved. Restrictive Type. No compensatory purging at present.  Assessment/Plan:   1. Eating disorder, unspecified type Follow up with eating disorder specialist for intensive CBT.  2. Obesity, current BMI 33.4 Kashlyn is currently in the action stage of change. As such, her goal is to continue with weight loss efforts. We do not recommend a reduced calorie meal plan at present time as she has a restrictive eating disorder and this may worsen that.  She will work with her counselor and eating  disorder specialist to address triggers through intensive cognitive behavioral therapy.  I will be seeing her back in 4 weeks.  Patient assured me that she will not restrict and would at least try to get in at 1000 cal a day.  She is adverse to some foods because of past trauma.  Exercise goals:  As is  Behavioral modification strategies: no skipping meals.  Kaysey has agreed to follow-up with our clinic in 4 weeks. She was informed of the importance of frequent follow-up visits to maximize her success with intensive lifestyle modifications for her multiple health conditions.   Objective:   Blood pressure 136/82, pulse (!) 101, temperature 97.7 F (36.5 C), height '5\' 8"'$  (1.727 m), weight 219 lb (99.3 kg), SpO2 99 %. Body mass index is 33.3 kg/m.  General: Cooperative, alert, well developed, in no acute distress. HEENT: Conjunctivae and lids unremarkable. Cardiovascular: Regular rhythm.  Lungs: Normal work of breathing. Neurologic: No focal deficits.   Lab Results  Component Value Date   CREATININE 0.96 02/09/2022   BUN 23 02/09/2022   NA 139 02/09/2022   K 4.4 02/09/2022   CL 105 02/09/2022   CO2 28 02/09/2022   Lab Results  Component Value Date   ALT 16 12/02/2016   AST 20 12/02/2016   ALKPHOS 96 12/02/2016   BILITOT 0.5 12/02/2016   Lab Results  Component Value Date   HGBA1C 5.9 02/09/2022   HGBA1C 5.2 12/06/2016   Lab Results  Component Value Date   INSULIN 5.8 08/03/2022   No results  found for: "TSH" No results found for: "CHOL", "HDL", "LDLCALC", "LDLDIRECT", "TRIG", "CHOLHDL" Lab Results  Component Value Date   VD25OH 30.1 08/03/2022   Lab Results  Component Value Date   WBC 7.2 05/24/2022   HGB 13.2 05/24/2022   HCT 41.0 05/24/2022   MCV 94.0 05/24/2022   PLT 238.0 05/24/2022     Attestation Statements:   Reviewed by clinician on day of visit: allergies, medications, problem list, medical history, surgical history, family history, social  history, and previous encounter notes.  Time spent on visit including pre-visit chart review and post-visit care and charting was 20 minutes.   I, Kathlene November, BS, CMA, am acting as transcriptionist for Thomes Dinning, MD.  I have reviewed the above documentation for accuracy and completeness, and I agree with the above. -Thomes Dinning, MD

## 2022-09-20 ENCOUNTER — Ambulatory Visit: Payer: 59 | Admitting: Pharmacist

## 2022-09-20 DIAGNOSIS — J4550 Severe persistent asthma, uncomplicated: Secondary | ICD-10-CM

## 2022-09-20 DIAGNOSIS — Z7189 Other specified counseling: Secondary | ICD-10-CM

## 2022-09-20 MED ORDER — FASENRA PEN 30 MG/ML ~~LOC~~ SOAJ: SUBCUTANEOUS | 0 refills | Status: DC

## 2022-09-20 MED ORDER — FASENRA PEN 30 MG/ML ~~LOC~~ SOAJ: SUBCUTANEOUS | 1 refills | Status: DC

## 2022-09-20 NOTE — Patient Instructions (Signed)
Your next [facility] dose is due on 10/18/2022, 11/15/22, and every 8 weeks thereafter (starting on 01/10/2023)  CONTINUE Breztri 160-9-4.8 mc g (2 puffs twice daily), montelukast '10mg'$  nightly   Your prescription will be shipped from [facility]. Their phone number is [phone] Please call to schedule shipment and confirm address. They will mail your medication to your home.  Your copay should be affordable. If you call the pharmacy and it is not affordable, please double-check that they are billing through your copay card as secondary coverage. That copay card information is: RxBIN: [id number] PCN: [id number] Group: [id number] ID: [id number]  You will need to be seen by your provider in 3 to 4 months to assess how FASENRA is working for you. Please ensure you have a follow-up appointment scheduled in April or May 2024. Call our clinic if you need to make this appointment.  How to manage an injection site reaction: Remember the 5 C's: COUNTER - leave on the counter at least 30 minutes but up to overnight to bring medication to room temperature. This may help prevent stinging COLD - place something cold (like an ice gel pack or cold water bottle) on the injection site just before cleansing with alcohol. This may help reduce pain CLARITIN - use Claritin (generic name is loratadine) for the first two weeks of treatment or the day of, the day before, and the day after injecting. This will help to minimize injection site reactions CORTISONE CREAM - apply if injection site is irritated and itching CALL ME - if injection site reaction is bigger than the size of your fist, looks infected, blisters, or if you develop hives

## 2022-09-20 NOTE — Progress Notes (Signed)
HPI Patient presents today to [facility] to see pharmacy team for [facility] new start.  Past medical history includes severe persistent asthma, adrenal insufficiency, allergic rhinitis, GERD, HTN   She started Nucala in June 2020. Her last dose of Nucala was on 05/25/22. She lost insurance in September 2023.  She was transitioned to [place] on 07/18/2022. Reported unilateral facial redness and dry skin around lips within 24-48 hours. Denies symptoms of anaphylaxis (throat swelling, difficulty breathing, facial or lip swelling). Denies development of hives or itchiness. She did not initially attribute this to [place] but after she took the second dose the redness reappeared within 24 hours. It took about 2-3 days to resolve. She denies change in detergent, makeup, skincare, and foods. Reports that redness has not reappeared since she has been off of Dupixent (last dose was about a month ago)  She was able to locate picture of facial redness. Appears to be slight puffiness under left eye and red ring hemi-circle formation.   Respiratory Medications Current regimen: Breztri 160-9-4.8 mc g (2 puffs twice daily), montelukast '10mg'$  nightly  Patient reports no known adherence challenges  OBJECTIVE Allergies  Allergen Reactions   Amoxicillin Anaphylaxis, Hives and Itching   Black [place] Pollen Allergy Skin Test Anaphylaxis   Penicillins Anaphylaxis    Has patient had a PCN reaction causing immediate rash, facial/tongue/throat swelling, SOB or lightheadedness with hypotension: Yes Has patient had a PCN reaction causing severe rash involving mucus membranes or skin necrosis: No Has patient had a PCN reaction that required hospitalization Yes Has patient had a PCN reaction occurring within the last 10 years: No If all of the above answers are "NO", then may proceed with Cephalosporin use.    Sulfa Antibiotics Hives and Other (See Comments)    Hives, mouth blisters    Chloroquine Other (See  Comments)    Renal dysfunction   Ciprofloxacin Hives   Phenergan [Promethazine Hcl] Other (See Comments)    Caused "severe drop in blood pressure."   Sulfamethoxazole Hives   Dupixent [Dupilumab] Other (See Comments)    Unilateral facial rash with possible dry skin development. Resolved after d/c   Other Other (See Comments)    From allergy testing--Canteloupe   Shellfish Allergy Itching and Rash   Vancomycin Cross Reactors Other (See Comments)    "RED MAN SYNDROME"  Pt stated she had no reaction to Vancomycin when it is given slow    Outpatient Encounter Medications as of 09/20/2022  Medication Sig Note   [START ON 10/18/2022] Benralizumab (FASENRA PEN) 30 MG/ML SOAJ Inject '30mg'$  into the skin at Week 4 (10/18/2022). (Received Week 0 dose in clinic on 09/20/2022)    [START ON 11/15/2022] Benralizumab (FASENRA PEN) 30 MG/ML SOAJ Inject '30mg'$  into the skin at Week 8 then every 8 weeks thereafter    ADDERALL XR 20 MG 24 hr capsule Take 20 mg by mouth daily as needed (concentration). 12/01/2016: Only takes when in school or writing papers   albuterol (PROVENTIL) (2.5 MG/3ML) 0.083% nebulizer solution Take 3 mLs (2.5 mg total) by nebulization every 6 (six) hours as needed for wheezing or shortness of breath.    albuterol (VENTOLIN HFA) 108 (90 Base) MCG/ACT inhaler Inhale 2 puffs into the lungs every 6 (six) hours as needed for wheezing or shortness of breath.    aspirin EC 81 MG tablet Take 81 mg by mouth daily. Swallow whole.    Budeson-Glycopyrrol-Formoterol (BREZTRI AEROSPHERE) 160-9-4.8 MCG/ACT AERO Inhale 2 puffs into the lungs in the  morning and at bedtime.    buPROPion (WELLBUTRIN XL) 300 MG 24 hr tablet Take 300 mg by mouth daily.    EPINEPHrine 0.3 mg/0.3 mL IJ SOAJ injection Inject 0.3 mg into the muscle as needed for anaphylaxis.    estradiol (CLIMARA - DOSED IN MG/24 HR) 0.0375 mg/24hr patch Place 0.0375 mg onto the skin once a week. saturdays    montelukast (SINGULAIR) 10 MG tablet  TAKE 1 TABLET BY MOUTH EVERYDAY AT BEDTIME    omeprazole (PRILOSEC) 20 MG capsule Take 20 mg by mouth daily.    OVER THE COUNTER MEDICATION Take 6 tablets by mouth daily. Smarty Pants Vitamins    PROAIR HFA 108 (90 Base) MCG/ACT inhaler Inhale 2 puffs into the lungs every 6 (six) hours as needed for wheezing or shortness of breath.    Probiotic CAPS Take 1 capsule by mouth daily.     Respiratory Therapy Supplies (FLUTTER) DEVI Use flutter valve 3 times a day    rosuvastatin (CRESTOR) 20 MG tablet Take 20 mg by mouth daily.    sertraline (ZOLOFT) 50 MG tablet Take 100 mg by mouth daily.    telmisartan (MICARDIS) 40 MG tablet Take 40 mg by mouth daily.    topiramate (TOPAMAX) 50 MG tablet Take 150 mg by mouth daily.    valACYclovir (VALTREX) 1000 MG tablet Take two tablets as a single dose prn cold sore. (Patient taking differently: Take 2,000 mg by mouth daily as needed. Take two tablets as a single dose prn cold sore.)    [DISCONTINUED] Dupilumab (DUPIXENT) 300 MG/2ML SOPN Inject 300 mg into the skin every 14 (fourteen) days. Loading dose of '600mg'$  completed in clinic 07/18/22    No facility-administered encounter medications on file as of 09/20/2022.     Immunization History  Administered Date(s) Administered   Influenza, Quadrivalent, Recombinant, Inj, Pf 05/27/2020, 06/03/2021   Influenza,inj,Quad PF,6+ Mos 07/01/2016, 07/14/2017, 05/27/2019, 05/27/2020   PFIZER(Purple Top)SARS-COV-2 Vaccination 12/12/2019, 01/06/2020, 07/21/2020, 09/25/2020, 04/14/2021   Pneumococcal Polysaccharide-23 06/24/2020, 05/17/2022   Td 06/18/2013   Tdap 06/27/2011, 07/14/2014   Zoster Recombinat (Shingrix) 02/23/2021, 04/14/2021     PFTs    Latest Ref Rng & Units 08/07/2018    9:45 AM 07/25/2018   12:02 PM  PFT Results  FVC-Pre L 3.53  3.61   FVC-Predicted Pre % 84  86   FVC-Post L 3.10  3.07   FVC-Predicted Post % 74  73   Pre FEV1/FVC % % 82  82   Post FEV1/FCV % % 86  88   FEV1-Pre L 2.88  2.97    FEV1-Predicted Pre % 87  90   FEV1-Post L 2.67  2.69   DLCO uncorrected ml/min/mmHg  22.39   DLCO UNC% %  72   DLVA Predicted %  86   TLC L  6.18   TLC % Predicted %  106   RV % Predicted %  132      Eosinophils Most recent blood eosinophil count was <50 cells/microL taken on 05/24/2022.  Has been >200 in the past (2019)  IgE: 6 on 05/24/2022   Assessment   Biologics training for benralizumab Berna Bue)  Goals of therapy: Mechanism of action: humanized afucosylated, monoclonal antibody (IgG1, kappa) that binds to the alpha subunit of the interleukin-5 receptor. IL-5 is the major cytokine responsible for the growth and differentiation, recruitment, activation, and survival of eosinophils (a cell type associated with inflammation and an important component in the pathogenesis of asthma) Reviewed that Berna Bue is add-on  medication and patient must continue maintenance inhaler regimen. Response to therapy: may take 4 months to determine efficacy. Discussed that patients generally feel improvement sooner than 4 months.  Side effects: antibody development (13%), headache (8%), pharyngitis (5%), injection site reaction (2.2%)  Dose: 30 mg subcutaneously at Week 0, Week 4, Week 8, then '30mg'$  every 8 weeks thereafter  Administration/Storage:   Reviewed administration sites of thigh or abdomen (at least 2-3 inches away from abdomen). Reviewed the upper arm is only appropriate if caregiver is administering injection  Do not shake the pen/syringe as this could lead to product foaming or precipitation. \ Reviewed storage of medication in refrigerator. Reviewed that Berna Bue can be stored at room temperature in unopened carton for up to 14 days.  Side effects: headache (19%), injection site reaction (7-15%), antibody development (6%), backache (5%), fatigue (5%)  Access: Approval of Fasenra through: insurance Patient enrolled into copay card program to help with copay assistance.  Patient  self-administered Fasenra '30mg'$ /mL in right upper thigh using sample Fasenra '30mg'$ /mL autoinjector pen NDC: [address]-44 Lot: [id number] Expiration: 07/2024  Patient monitored for 30 minutes for adverse reaction.  Patient tolerated without issue. Injection site checked and no reaction noted  Medication Reconciliation  A drug regimen assessment was performed, including review of allergies, interactions, disease-state management, dosing and immunization history. Medications were reviewed with the patient, including name, instructions, indication, goals of therapy, potential side effects, importance of adherence, and safe use.  Drug interaction(s): none noted  PLAN Continue Fasenra '30mg'$  at Week 4 on 10/18/2022 and Week 8 on 11/15/2022. Then continue Fasenra '30mg'$  every 8 weeks thereafter starting on 01/10/2023. Rx sent to: [facility]: [phone]. Patient provided with pharmacy phone number and advised to call after new year to schedule shipment to home. Patient provided with copay card information to provide to pharmacy if quoted copay exceeds $5 per month. Reviewed signs/symptoms of anaphylaxis which require emergent care. Reviewed different between anaphylaxis and allergic reaction. She is to contact us with any similar reaction to Dupixent. Continue maintenance asthma regimen of:  Breztri 160-9-4.8 mc g (2 puffs twice daily), montelukast '10mg'$  nightly  Dupixent added to allergy list today.  All questions encouraged and answered.  Instructed patient to reach out with any further questions or concerns.  Thank you for allowing pharmacy to participate in this patient's care.  This appointment required 60 minutes of patient care (this includes precharting, chart review, review of results, face-to-face care, etc.).  Knox Saliva, PharmD, MPH, BCPS, CPP Clinical Pharmacist (Rheumatology and Pulmonology)

## 2022-09-28 ENCOUNTER — Ambulatory Visit (INDEPENDENT_AMBULATORY_CARE_PROVIDER_SITE_OTHER): Payer: 59 | Admitting: Internal Medicine

## 2022-10-03 ENCOUNTER — Ambulatory Visit (INDEPENDENT_AMBULATORY_CARE_PROVIDER_SITE_OTHER): Payer: 59 | Admitting: Internal Medicine

## 2022-10-03 ENCOUNTER — Encounter (INDEPENDENT_AMBULATORY_CARE_PROVIDER_SITE_OTHER): Payer: Self-pay | Admitting: Internal Medicine

## 2022-10-03 VITALS — BP 132/82 | HR 97 | Temp 97.8°F | Ht 68.0 in | Wt 219.0 lb

## 2022-10-03 DIAGNOSIS — I10 Essential (primary) hypertension: Secondary | ICD-10-CM

## 2022-10-03 DIAGNOSIS — E669 Obesity, unspecified: Secondary | ICD-10-CM | POA: Diagnosis not present

## 2022-10-03 DIAGNOSIS — Z6833 Body mass index (BMI) 33.0-33.9, adult: Secondary | ICD-10-CM

## 2022-10-03 DIAGNOSIS — R69 Illness, unspecified: Secondary | ICD-10-CM | POA: Diagnosis not present

## 2022-10-03 DIAGNOSIS — F509 Eating disorder, unspecified: Secondary | ICD-10-CM

## 2022-10-03 NOTE — Progress Notes (Signed)
Chief Complaint:   OBESITY [name] [name] is here to discuss her progress with her obesity treatment plan along with follow-up of her obesity related diagnoses. [name] [name] is on keeping a food journal and adhering to recommended goals of 1200 calories and 85 grams of protein and states she is following her eating plan approximately 50% of the time. [name] [name] states she is on the treadmill 2 miles 2-3 times per week.    Today's visit was #: 4 Starting weight: 219 lbs Starting date: 08/03/2022 Today's weight: 219 lbs Today's date: 10/03/2022 Total lbs lost to date: 0 Total lbs lost since last in-office visit: 0  Interim History: [name] [name] presents today for follow-up.  Since last office visit she remains weight neutral.  She has started counseling with [name] [name] for restrictive eating disorder.  She feels she is making some progress and has not been restricting her nutritional intake or using purging strategies.  She reports significant stress related to her husband's Parkinson's and what she describes at times inappropriate behaviors.  She feels safe at home and has been working with his neurologist on the matter.  She is now exploring smoothies.  Patient is adverse to certain fruit and vegetable textures because of traumatic past.  She is physically active.  Subjective:   1. Hypertension, unspecified type Blood pressure is well-controlled.  On Micardis 40 mg a day without any side effects. GFR from May was 1.  2. Eating disorder, unspecified type Improved.  No restrictive eating or purging noted.  Now following up with [name] [name].  Assessment/Plan:   1. Hypertension, unspecified type continue current medications.  Monitor for orthostasis and maintain adequate hydration.  2. Eating disorder, unspecified type Continue with intensive cognitive behavioral therapy.  Avoid reduced calorie nutritional plans at present time.  3. Obesity, current BMI 33.4 Because of restrictive eating disorder we do  not recommend a reduced calorie approach at present time.  We reviewed strategies to making healthy smoothie that which she could get in more vegetables and fruits which was difficult for her from a psychological standpoint.  She is also using protein powder to fortify smoothly.  [name] [name] is currently in the action stage of change. As such, her goal is to continue with weight loss efforts. She has agreed to the Category 2 Plan.   Exercise goals: As is.   Behavioral modification strategies: increasing lean protein intake, increasing vegetables, increasing water intake, no skipping meals, and planning for success.  [name] [name] has agreed to follow-up with our clinic in 4 weeks. She was informed of the importance of frequent follow-up visits to maximize her success with intensive lifestyle modifications for her multiple health conditions.   Objective:   Blood pressure 132/82, pulse 97, temperature 97.8 F (36.6 C), height '5\' 8"'$  (1.727 m), weight 219 lb (99.3 kg), SpO2 99 %. Body mass index is 33.3 kg/m.  General: Cooperative, alert, well developed, in no acute distress. HEENT: Conjunctivae and lids unremarkable. Cardiovascular: Regular rhythm.  Lungs: Normal work of breathing. Neurologic: No focal deficits.   Lab Results  Component Value Date   CREATININE 0.96 02/09/2022   BUN 23 02/09/2022   NA 139 02/09/2022   K 4.4 02/09/2022   CL 105 02/09/2022   CO2 28 02/09/2022   Lab Results  Component Value Date   ALT 16 12/02/2016   AST 20 12/02/2016   ALKPHOS 96 12/02/2016   BILITOT 0.5 12/02/2016   Lab Results  Component Value Date   HGBA1C 5.9 02/09/2022  HGBA1C 5.2 12/06/2016   Lab Results  Component Value Date   INSULIN 5.8 08/03/2022   No results found for: "TSH" No results found for: "CHOL", "HDL", "LDLCALC", "LDLDIRECT", "TRIG", "CHOLHDL" Lab Results  Component Value Date   VD25OH 30.1 08/03/2022   Lab Results  Component Value Date   WBC 7.2 05/24/2022   HGB 13.2  05/24/2022   HCT 41.0 05/24/2022   MCV 94.0 05/24/2022   PLT 238.0 05/24/2022   No results found for: "IRON", "TIBC", "FERRITIN"  Attestation Statements:   Reviewed by clinician on day of visit: allergies, medications, problem list, medical history, surgical history, family history, social history, and previous encounter notes.   Wilhemena Durie, am acting as transcriptionist for Thomes Dinning, MD.  I have reviewed the above documentation for accuracy and completeness, and I agree with the above. -Thomes Dinning, MD

## 2022-10-07 DIAGNOSIS — I10 Essential (primary) hypertension: Secondary | ICD-10-CM | POA: Diagnosis not present

## 2022-10-07 DIAGNOSIS — R7301 Impaired fasting glucose: Secondary | ICD-10-CM | POA: Diagnosis not present

## 2022-10-07 DIAGNOSIS — Z23 Encounter for immunization: Secondary | ICD-10-CM | POA: Diagnosis not present

## 2022-10-07 DIAGNOSIS — M8580 Other specified disorders of bone density and structure, unspecified site: Secondary | ICD-10-CM | POA: Diagnosis not present

## 2022-10-07 DIAGNOSIS — D352 Benign neoplasm of pituitary gland: Secondary | ICD-10-CM | POA: Diagnosis not present

## 2022-10-07 DIAGNOSIS — I700 Atherosclerosis of aorta: Secondary | ICD-10-CM | POA: Diagnosis not present

## 2022-10-07 DIAGNOSIS — J4550 Severe persistent asthma, uncomplicated: Secondary | ICD-10-CM | POA: Diagnosis not present

## 2022-10-11 ENCOUNTER — Telehealth: Payer: Self-pay | Admitting: Pharmacist

## 2022-10-11 ENCOUNTER — Other Ambulatory Visit (HOSPITAL_COMMUNITY): Payer: Self-pay

## 2022-10-11 NOTE — Telephone Encounter (Signed)
Patient called and said that she's been having issues with her insurance. States she has an $8000 bill from MD visits from December. She is experiencing some level of overwhelm with this. She states she will likely have to delay Fasenra dose. She does not want anything shipped until she is confident that her insurance is active.  I was able to pull through her insurance today and able to run test claim without a rejcetion stating coverage has terminated. Advised her to let us know if any issues  Knox Saliva, PharmD, MPH, BCPS, CPP Clinical Pharmacist (Rheumatology and Pulmonology)

## 2022-10-24 DIAGNOSIS — F4311 Post-traumatic stress disorder, acute: Secondary | ICD-10-CM | POA: Diagnosis not present

## 2022-10-24 DIAGNOSIS — F900 Attention-deficit hyperactivity disorder, predominantly inattentive type: Secondary | ICD-10-CM | POA: Diagnosis not present

## 2022-10-24 DIAGNOSIS — R69 Illness, unspecified: Secondary | ICD-10-CM | POA: Diagnosis not present

## 2022-10-25 ENCOUNTER — Other Ambulatory Visit: Payer: Self-pay | Admitting: Nurse Practitioner

## 2022-10-25 DIAGNOSIS — R69 Illness, unspecified: Secondary | ICD-10-CM | POA: Diagnosis not present

## 2022-10-25 DIAGNOSIS — Z01419 Encounter for gynecological examination (general) (routine) without abnormal findings: Secondary | ICD-10-CM | POA: Diagnosis not present

## 2022-10-25 DIAGNOSIS — M85851 Other specified disorders of bone density and structure, right thigh: Secondary | ICD-10-CM

## 2022-10-25 DIAGNOSIS — Z113 Encounter for screening for infections with a predominantly sexual mode of transmission: Secondary | ICD-10-CM | POA: Diagnosis not present

## 2022-10-25 DIAGNOSIS — N951 Menopausal and female climacteric states: Secondary | ICD-10-CM | POA: Diagnosis not present

## 2022-10-28 ENCOUNTER — Other Ambulatory Visit: Payer: 59

## 2022-10-31 ENCOUNTER — Encounter (INDEPENDENT_AMBULATORY_CARE_PROVIDER_SITE_OTHER): Payer: Self-pay | Admitting: Internal Medicine

## 2022-10-31 ENCOUNTER — Ambulatory Visit (INDEPENDENT_AMBULATORY_CARE_PROVIDER_SITE_OTHER): Payer: 59 | Admitting: Internal Medicine

## 2022-10-31 VITALS — BP 135/87 | HR 89 | Temp 97.9°F | Ht 68.0 in | Wt 217.0 lb

## 2022-10-31 DIAGNOSIS — R7303 Prediabetes: Secondary | ICD-10-CM | POA: Diagnosis not present

## 2022-10-31 DIAGNOSIS — E669 Obesity, unspecified: Secondary | ICD-10-CM

## 2022-10-31 DIAGNOSIS — Z6832 Body mass index (BMI) 32.0-32.9, adult: Secondary | ICD-10-CM

## 2022-10-31 DIAGNOSIS — F509 Eating disorder, unspecified: Secondary | ICD-10-CM | POA: Insufficient documentation

## 2022-10-31 DIAGNOSIS — E559 Vitamin D deficiency, unspecified: Secondary | ICD-10-CM | POA: Insufficient documentation

## 2022-10-31 DIAGNOSIS — R69 Illness, unspecified: Secondary | ICD-10-CM | POA: Diagnosis not present

## 2022-10-31 NOTE — Assessment & Plan Note (Signed)
Most recent A1c is  Lab Results  Component Value Date   HGBA1C 5.9 02/09/2022   with associated elevated insulin levels.  Patient informed of disease state and risk of progression. This may contribute to abnormal cravings, fatigue and diabetes complications without having diabetes.   We reviewed treatment options which include weight loss of about 7 to 10% of body weight, increasing physical activity to 150 minutes a week.  [name] [name] is currently working with eating disorder specialist regarding restrictive eating.  [name] [name] is not a candidate for a reduced calorie nutrition plan until mental health is addressed.  [name] [name] is also not a candidate for incretin therapy.

## 2022-10-31 NOTE — Progress Notes (Signed)
Office: [phone]  /  Fax: [phone]  WEIGHT SUMMARY AND BIOMETRICS  Medical Weight Loss Height: '5\' 8"'$  (1.727 m) Weight: 217 lb (98.4 kg) Temp: 97.9 F (36.6 C) Pulse Rate: 89 BP: 135/87 SpO2: 99 % Fasting: no Labs: no Today's Visit #: 5 Weight at Last VIsit: 219 lb Weight Lost Since Last Visit: 2 lb  Body Fat %: 44.5 % Fat Mass (lbs): 97 lbs Muscle Mass (lbs): 114.6 lbs Total Body Water (lbs): 81.4 lbs Visceral Fat Rating : 11 Peak Weight: 250 lb Starting Date: 08/03/22 Starting Weight: 219 lb Total Weight Loss (lbs): 2 lb (0.907 kg)    HPI  Chief Complaint: OBESITY  [name] [name] is here to discuss her progress with her obesity treatment plan.  She is not following any particular nutritional strategy due psychological adversity to fruits and vegetables.  Has been working on not restricting eating.   Interval History:  Since last office visit she she has lost 2 pounds.  She is still having difficulties with her mental health and is seen behavioral health specialist every 2 weeks.  She is under significant stress due to marital and past traumatic experiences. This has an effect on nutritional patterns.  She endorses restricting food for 48 hours at a time and denies purging. We do not have her on a reduce calorie plan due to restrictive nature of eating disorder.  She is receiving intensive CBT for this.   Pharmacotherapy: None from a weight management standpoint  PHYSICAL EXAM:  Blood pressure 135/87, pulse 89, temperature 97.9 F (36.6 C), height '5\' 8"'$  (1.727 m), weight 217 lb (98.4 kg), SpO2 99 %. Body mass index is 32.99 kg/m.  General: She is overweight, cooperative, alert, well developed, and in no acute distress. PSYCH: Has normal mood, affect and thought process.   HEENT: EOMI, sclerae are anicteric. Lungs: Normal breathing effort, no conversational dyspnea. Extremities: No edema.  Neurologic: No gross sensory or motor deficits. No tremors or  fasciculations noted.    DIAGNOSTIC DATA REVIEWED:  BMET    Component Value Date/Time   NA 139 02/09/2022 0953   K 4.4 02/09/2022 0953   CL 105 02/09/2022 0953   CO2 28 02/09/2022 0953   GLUCOSE 90 02/09/2022 0953   BUN 23 02/09/2022 0953   CREATININE 0.96 02/09/2022 0953   CREATININE 0.84 09/25/2018 1156   CALCIUM 9.6 02/09/2022 0953   GFRNONAA >60 12/08/2016 0608   GFRAA >60 12/08/2016 0608   Lab Results  Component Value Date   HGBA1C 5.9 02/09/2022   HGBA1C 5.2 12/06/2016   Lab Results  Component Value Date   INSULIN 5.8 08/03/2022   No results found for: "TSH" CBC    Component Value Date/Time   WBC 7.2 05/24/2022 1629   RBC 4.36 05/24/2022 1629   HGB 13.2 05/24/2022 1629   HCT 41.0 05/24/2022 1629   PLT 238.0 05/24/2022 1629   MCV 94.0 05/24/2022 1629   MCH 32.6 09/25/2018 1156   MCHC 32.3 05/24/2022 1629   RDW 14.0 05/24/2022 1629   Iron Studies No results found for: "IRON", "TIBC", "FERRITIN", "IRONPCTSAT" Lipid Panel  No results found for: "CHOL", "TRIG", "HDL", "CHOLHDL", "VLDL", "LDLCALC", "LDLDIRECT" Hepatic Function Panel     Component Value Date/Time   PROT 6.8 12/02/2016 0902   ALBUMIN 4.1 12/02/2016 0902   AST 20 12/02/2016 0902   ALT 16 12/02/2016 0902   ALKPHOS 96 12/02/2016 0902   BILITOT 0.5 12/02/2016 0902   No results found for: "TSH" Nutritional Lab  Results  Component Value Date   VD25OH 30.1 08/03/2022     ASSESSMENT AND PLAN  TREATMENT PLAN FOR OBESITY:  Recommended Dietary Goals  [name] [name] is currently in the action stage of change. As such, her goal is to continue weight management plan. She has agreed to keeping a food journal and adhering to recommended goals of 1100 calories and 100 protein.  Behavioral Intervention  We discussed the following Behavioral Modification Strategies today: increasing lean protein intake and avoid skipping meals. Follow-up with eating disorder specialist for intensive cognitive behavioral  therapy.  Additional resources provided today: NA  Recommended Physical Activity Goals  [name] [name] has been advised to work up to 150 minutes of moderate intensity aerobic activity a week and strengthening exercises 2-3 times per week for cardiovascular health, weight loss maintenance and preservation of muscle mass.   She has agreed to increase physical activity in their day and reduce sedentary time (increase NEAT).  and continue physical activity as is.    Pharmacotherapy We discussed various medication options to help [name] [name] with her weight loss efforts and we both agreed to continue with nutritional and behavioral strategies.  She is not a candidate for pharmacotherapy  ASSOCIATED CONDITIONS ADDRESSED TODAY  Eating disorder, unspecified type Assessment & Plan: Unchanged, patient still restricting 24 to 48 hours a week.  She will continue to follow-up with behavioral health specialist for intensive CBT.   Obesity, current BMI 32.9  Prediabetes Assessment & Plan: Most recent A1c is  Lab Results  Component Value Date   HGBA1C 5.9 02/09/2022   with associated elevated insulin levels.  Patient informed of disease state and risk of progression. This may contribute to abnormal cravings, fatigue and diabetes complications without having diabetes.   We reviewed treatment options which include weight loss of about 7 to 10% of body weight, increasing physical activity to 150 minutes a week.  She is currently working with eating disorder specialist regarding restrictive eating.  She is not a candidate for a reduced calorie nutrition plan until mental health is addressed.  She is also not a candidate for incretin therapy.       Return in about 4 weeks (around 11/28/2022) for For Weight Mangement with Dr. Gerarda Fraction.Marland Kitchen She was informed of the importance of frequent follow up visits to maximize her success with intensive lifestyle modifications for her multiple health  conditions.   ATTESTASTION STATEMENTS:  Reviewed by clinician on day of visit: allergies, medications, problem list, medical history, surgical history, family history, social history, and previous encounter notes.   Time spent on visit including pre-visit chart review and post-visit care and charting was 30 minutes.    Thomes Dinning, MD

## 2022-10-31 NOTE — Assessment & Plan Note (Signed)
Unchanged, patient still restricting 24 to 48 hours a week.  She will continue to follow-up with behavioral health specialist for intensive CBT.

## 2022-11-03 DIAGNOSIS — F4311 Post-traumatic stress disorder, acute: Secondary | ICD-10-CM | POA: Diagnosis not present

## 2022-11-03 DIAGNOSIS — R69 Illness, unspecified: Secondary | ICD-10-CM | POA: Diagnosis not present

## 2022-11-03 DIAGNOSIS — F900 Attention-deficit hyperactivity disorder, predominantly inattentive type: Secondary | ICD-10-CM | POA: Diagnosis not present

## 2022-11-23 ENCOUNTER — Ambulatory Visit
Admission: RE | Admit: 2022-11-23 | Discharge: 2022-11-23 | Disposition: A | Discharge status: Home / Self Care | Payer: 59 | Source: Ambulatory Visit | Attending: Nurse Practitioner | Admitting: Nurse Practitioner

## 2022-11-23 DIAGNOSIS — M85851 Other specified disorders of bone density and structure, right thigh: Secondary | ICD-10-CM

## 2022-11-23 DIAGNOSIS — M85832 Other specified disorders of bone density and structure, left forearm: Secondary | ICD-10-CM | POA: Diagnosis not present

## 2022-11-23 DIAGNOSIS — Z780 Asymptomatic menopausal state: Secondary | ICD-10-CM | POA: Diagnosis not present

## 2022-11-29 ENCOUNTER — Ambulatory Visit (INDEPENDENT_AMBULATORY_CARE_PROVIDER_SITE_OTHER): Payer: 59 | Admitting: Internal Medicine

## 2022-12-13 ENCOUNTER — Encounter (INDEPENDENT_AMBULATORY_CARE_PROVIDER_SITE_OTHER): Payer: Self-pay | Admitting: Internal Medicine

## 2022-12-13 ENCOUNTER — Ambulatory Visit (INDEPENDENT_AMBULATORY_CARE_PROVIDER_SITE_OTHER): Payer: 59 | Admitting: Internal Medicine

## 2022-12-13 VITALS — BP 122/83 | HR 90 | Temp 98.3°F | Ht 68.0 in | Wt 220.0 lb

## 2022-12-13 DIAGNOSIS — F509 Eating disorder, unspecified: Secondary | ICD-10-CM

## 2022-12-13 DIAGNOSIS — F900 Attention-deficit hyperactivity disorder, predominantly inattentive type: Secondary | ICD-10-CM | POA: Diagnosis not present

## 2022-12-13 DIAGNOSIS — F3341 Major depressive disorder, recurrent, in partial remission: Secondary | ICD-10-CM | POA: Diagnosis not present

## 2022-12-13 DIAGNOSIS — Z6833 Body mass index (BMI) 33.0-33.9, adult: Secondary | ICD-10-CM

## 2022-12-13 DIAGNOSIS — E669 Obesity, unspecified: Secondary | ICD-10-CM | POA: Diagnosis not present

## 2022-12-13 NOTE — Assessment & Plan Note (Signed)
Improving.  Patient is now open to having some fruit.  We discussed ways to increase protein intake and incorporating vegetables into her smoothies.  She will continue to follow-up with. She will continue to follow-up with behavioral health specialist for intensive CBT.

## 2022-12-13 NOTE — Assessment & Plan Note (Signed)
Patient is not following a reduced calorie nutrition plan because of restrictive eating disorder and certain adversity to meats, vegetables and other food textures which limit nutritional options.  We been working on increasing the variety of foods and healthy meal prep.

## 2022-12-13 NOTE — Progress Notes (Signed)
Office: [phone]  /  Fax: [phone]  WEIGHT SUMMARY AND BIOMETRICS  Vitals Temp: 98.3 F (36.8 C) BP: 122/83 Pulse Rate: 90 SpO2: 99 %   Anthropometric Measurements Height: 5\' 8"  (1.727 m) Weight: 220 lb (99.8 kg) BMI (Calculated): 33.46 Weight at Last Visit: 217 lb Weight Gained Since Last Visit: 3 lb Starting Weight: 219 lb Total Weight Loss (lbs): 0 lb (0 kg) Peak Weight: 250 lb   Body Composition  Body Fat %: 45.2 % Fat Mass (lbs): 99.8 lbs Muscle Mass (lbs): 114.8 lbs Total Body Water (lbs): 84.6 lbs Visceral Fat Rating : 12    HPI  Chief Complaint: OBESITY  [name] [name] is here to discuss her progress with her obesity treatment plan. She is currently not following a plan. She states she is not.exercising.  Interval History:  Since last office visit she has gained 3 lbs. She reports has not implemented reduced calorie nutritional plan due to restrictive eating tendencies She has been working on receiving cognitive behavioral therapy for restrictive eating.  She has also started to enjoy eating fruits.  She had not eaten berries in the past and has been making some fruit smoothies with protein powder and fair life milk but no vegetables.  She has adversity to certain foods and textures due to past traumatic events Stress levels are reported as high and due to Family marital problems .  Barriers identified having difficulty preparing healthy meals, having difficulty with meal prep and planning, and inability to focus on healthy eating.   Pharmacotherapy for weight loss: She is currently taking no anti-obesity medication.    ASSESSMENT AND PLAN  TREATMENT PLAN FOR OBESITY:  Recommended Dietary Goals  [name] [name] is currently in the action stage of change. As such, her goal is to continue weight management plan. She has agreed to: practicing portion control and making smarter food choices, such as increasing vegetables and decreasing simple  carbohydrates.  Behavioral Intervention  We discussed the following Behavioral Modification Strategies today: increasing lean protein intake, increasing vegetables, increasing fiber rich foods, increasing water intake, and work on meal planning and easy cooking plans.  Additional resources provided today: None  Recommended Physical Activity Goals  [name] [name] has been advised to work up to 150 minutes of moderate intensity aerobic activity a week and strengthening exercises 2-3 times per week for cardiovascular health, weight loss maintenance and preservation of muscle mass.   She has agreed to :  Start aerobic activity with a goal of 150 minutes a week at moderate intensity.   Pharmacotherapy We discussed various medication options to help [name] [name] with her weight loss efforts and we both agreed to : continue with nutritional and behavioral strategies  ASSOCIATED CONDITIONS ADDRESSED TODAY  Eating disorder, unspecified type Assessment & Plan: Improving.  Patient is now open to having some fruit.  We discussed ways to increase protein intake and incorporating vegetables into her smoothies.  She will continue to follow-up with. She will continue to follow-up with behavioral health specialist for intensive CBT.   Obesity, current BMI 33 Assessment & Plan: Patient is not following a reduced calorie nutrition plan because of restrictive eating disorder and certain adversity to meats, vegetables and other food textures which limit nutritional options.  We been working on increasing the variety of foods and healthy meal prep.      PHYSICAL EXAM:  Blood pressure 122/83, pulse 90, temperature 98.3 F (36.8 C), height 5\' 8"  (1.727 m), weight 220 lb (99.8 kg), SpO2 99 %. Body  mass index is 33.45 kg/m.  General: She is overweight, cooperative, alert, well developed, and in no acute distress. PSYCH: Has normal mood, affect and thought process.   HEENT: EOMI, sclerae are anicteric. Lungs:  Normal breathing effort, no conversational dyspnea. Extremities: No edema.  Neurologic: No gross sensory or motor deficits. No tremors or fasciculations noted.    DIAGNOSTIC DATA REVIEWED:  BMET    Component Value Date/Time   NA 139 02/09/2022 0953   K 4.4 02/09/2022 0953   CL 105 02/09/2022 0953   CO2 28 02/09/2022 0953   GLUCOSE 90 02/09/2022 0953   BUN 23 02/09/2022 0953   CREATININE 0.96 02/09/2022 0953   CREATININE 0.84 09/25/2018 1156   CALCIUM 9.6 02/09/2022 0953   GFRNONAA >60 12/08/2016 0608   GFRAA >60 12/08/2016 0608   Lab Results  Component Value Date   HGBA1C 5.9 02/09/2022   HGBA1C 5.2 12/06/2016   Lab Results  Component Value Date   INSULIN 5.8 08/03/2022   No results found for: "TSH" CBC    Component Value Date/Time   WBC 7.2 05/24/2022 1629   RBC 4.36 05/24/2022 1629   HGB 13.2 05/24/2022 1629   HCT 41.0 05/24/2022 1629   PLT 238.0 05/24/2022 1629   MCV 94.0 05/24/2022 1629   MCH 32.6 09/25/2018 1156   MCHC 32.3 05/24/2022 1629   RDW 14.0 05/24/2022 1629   Iron Studies No results found for: "IRON", "TIBC", "FERRITIN", "IRONPCTSAT" Lipid Panel  No results found for: "CHOL", "TRIG", "HDL", "CHOLHDL", "VLDL", "LDLCALC", "LDLDIRECT" Hepatic Function Panel     Component Value Date/Time   PROT 6.8 12/02/2016 0902   ALBUMIN 4.1 12/02/2016 0902   AST 20 12/02/2016 0902   ALT 16 12/02/2016 0902   ALKPHOS 96 12/02/2016 0902   BILITOT 0.5 12/02/2016 0902   No results found for: "TSH" Nutritional Lab Results  Component Value Date   VD25OH 30.1 08/03/2022     Return in about 4 weeks (around 01/10/2023) for For Weight Mangement with Dr. Gerarda Fraction.Marland Kitchen She was informed of the importance of frequent follow up visits to maximize her success with intensive lifestyle modifications for her multiple health conditions.   ATTESTASTION STATEMENTS:  Reviewed by clinician on day of visit: allergies, medications, problem list, medical history, surgical  history, family history, social history, and previous encounter notes.   I have spent 30 minutes in the care of the patient today including: counseling and educating the patient, documenting clinical information in the electronic or other health care record, and care coordination   Thomes Dinning, MD

## 2022-12-20 DIAGNOSIS — F3341 Major depressive disorder, recurrent, in partial remission: Secondary | ICD-10-CM | POA: Diagnosis not present

## 2022-12-20 DIAGNOSIS — F900 Attention-deficit hyperactivity disorder, predominantly inattentive type: Secondary | ICD-10-CM | POA: Diagnosis not present

## 2023-01-10 ENCOUNTER — Ambulatory Visit (INDEPENDENT_AMBULATORY_CARE_PROVIDER_SITE_OTHER): Payer: 59 | Admitting: Internal Medicine

## 2023-01-16 IMAGING — DX DG CHEST 2V
2 series · 2 of 2 positions shown · non-contrast
Comparison: September 16, 2019.

CLINICAL DATA: Cough.

EXAM:
CHEST - 2 VIEW

[chest pa]
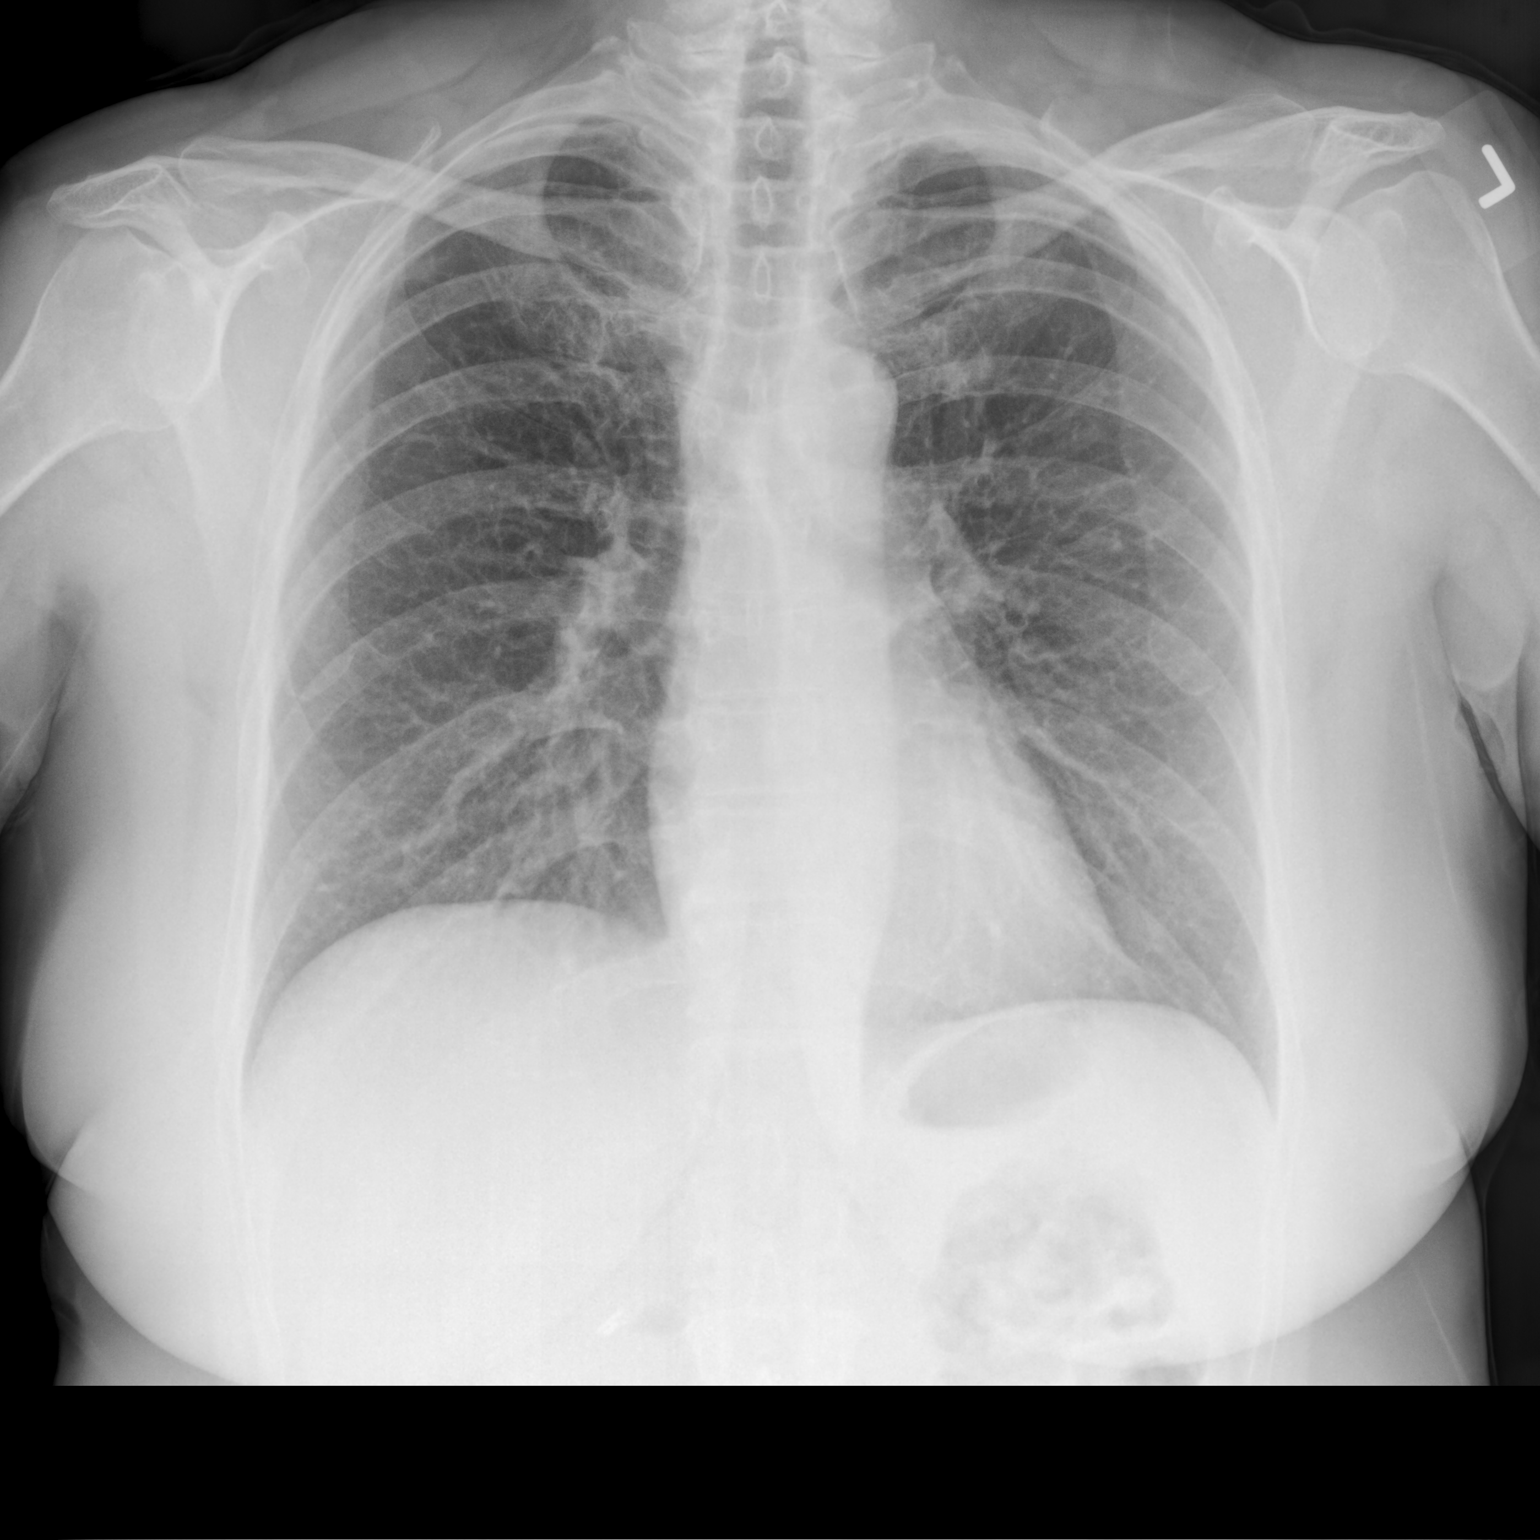

[chest lat]
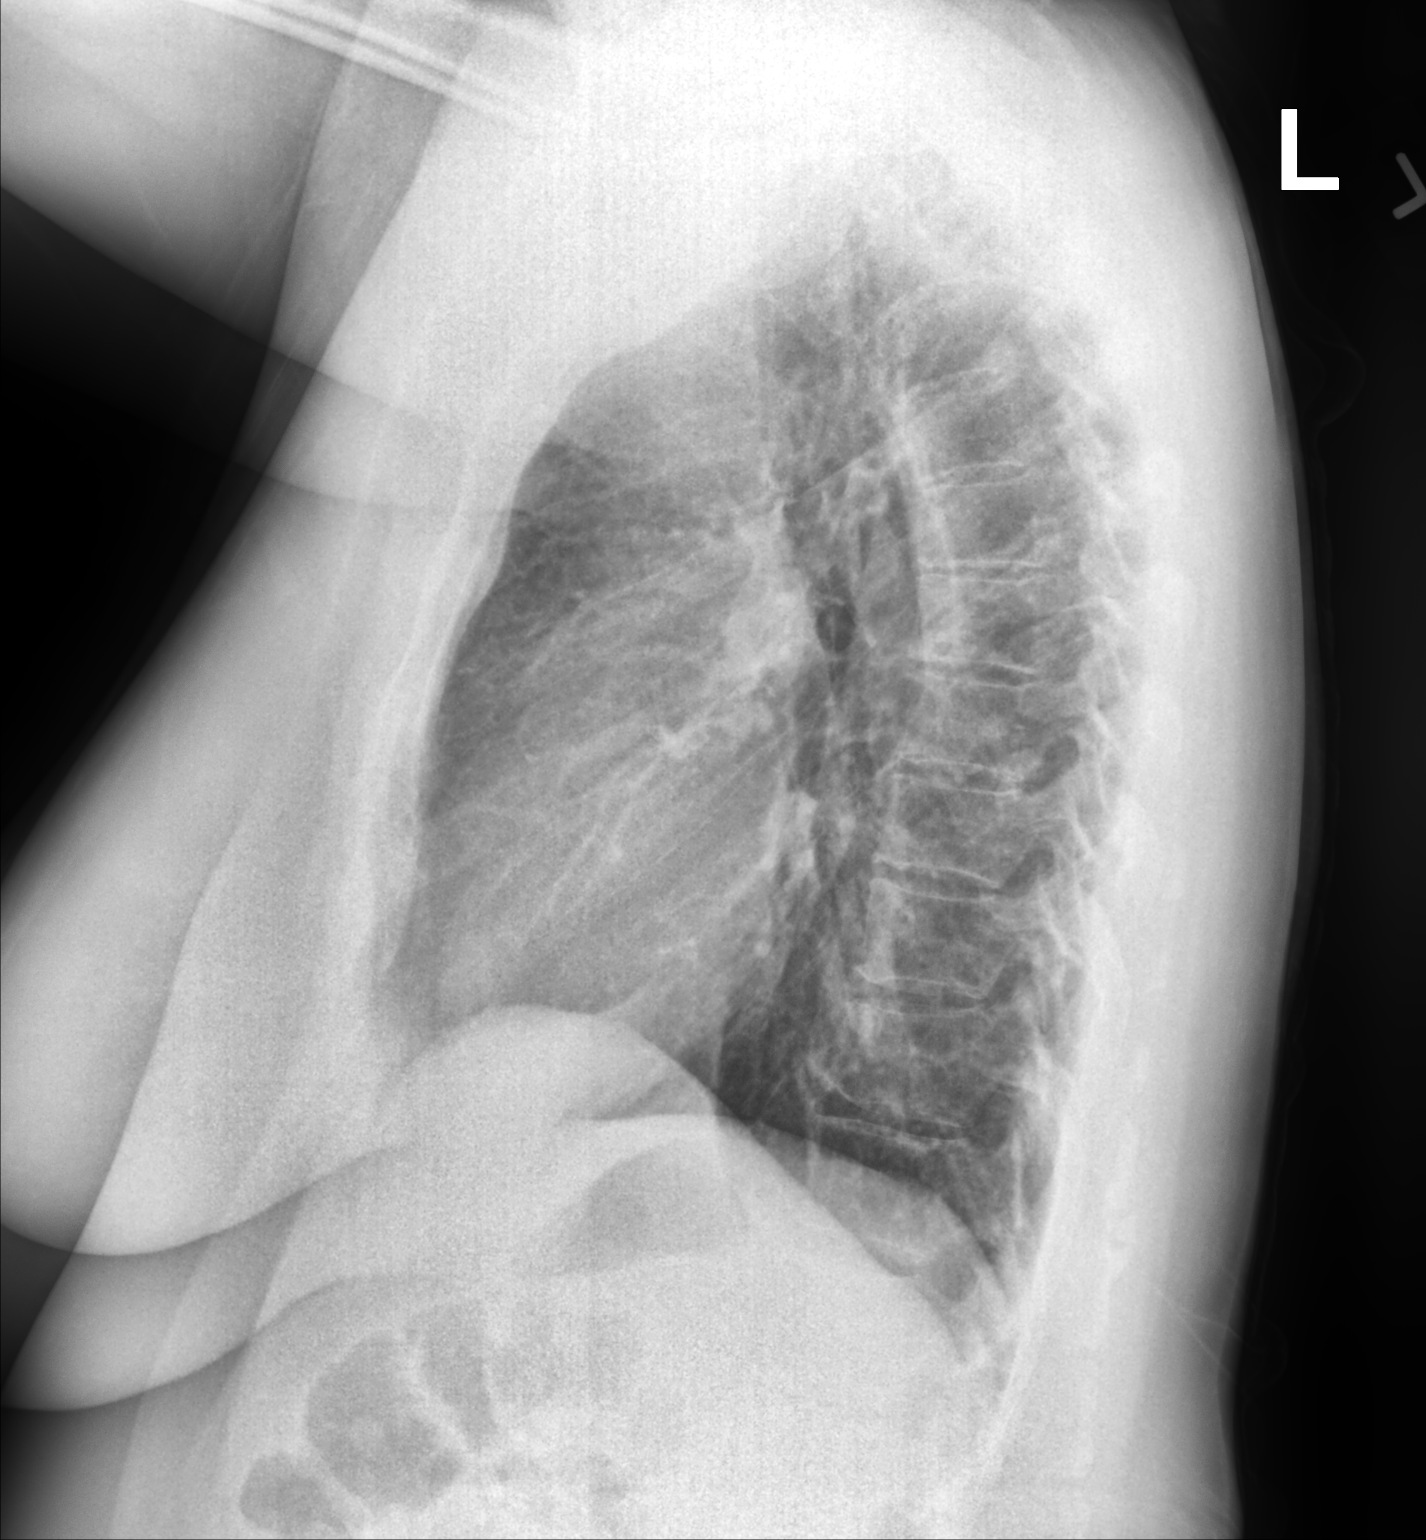

[2 of 2 positions shown; findings below may reference images not displayed]

FINDINGS: The heart size and mediastinal contours are within normal limits.
Both lungs are clear. The visualized skeletal structures are
unremarkable.
IMPRESSION: No active cardiopulmonary disease.

## 2023-01-17 DIAGNOSIS — F3341 Major depressive disorder, recurrent, in partial remission: Secondary | ICD-10-CM | POA: Diagnosis not present

## 2023-01-17 DIAGNOSIS — F900 Attention-deficit hyperactivity disorder, predominantly inattentive type: Secondary | ICD-10-CM | POA: Diagnosis not present

## 2023-02-09 ENCOUNTER — Telehealth: Payer: Self-pay | Admitting: Pharmacist

## 2023-02-09 NOTE — Telephone Encounter (Signed)
Received PA renewal form for [name] [name] however patient has not been seen since starting [facility]. She needs fu appt with Dr. Everardo All as soon as feasible. Her current [name] Challenger PA expires on 03/11/23  Routing to scheduling team

## 2023-02-10 ENCOUNTER — Telehealth: Payer: Self-pay | Admitting: Pharmacist

## 2023-02-10 ENCOUNTER — Other Ambulatory Visit (HOSPITAL_COMMUNITY): Payer: Self-pay

## 2023-02-10 ENCOUNTER — Ambulatory Visit (HOSPITAL_BASED_OUTPATIENT_CLINIC_OR_DEPARTMENT_OTHER): Payer: 59 | Admitting: Pulmonary Disease

## 2023-02-10 ENCOUNTER — Encounter (HOSPITAL_BASED_OUTPATIENT_CLINIC_OR_DEPARTMENT_OTHER): Payer: Self-pay | Admitting: Pulmonary Disease

## 2023-02-10 VITALS — BP 122/78 | HR 88 | Temp 98.2°F | Ht 69.0 in | Wt 229.0 lb

## 2023-02-10 DIAGNOSIS — J4550 Severe persistent asthma, uncomplicated: Secondary | ICD-10-CM

## 2023-02-10 MED ORDER — FLUTICASONE-SALMETEROL 250-50 MCG/ACT IN AEPB: 1.0000 | INHALATION_SPRAY | Freq: Two times a day (BID) | RESPIRATORY_TRACT | 5 refills | Status: DC

## 2023-02-10 MED ORDER — AZITHROMYCIN 250 MG PO TABS: ORAL_TABLET | ORAL | 0 refills | Status: DC

## 2023-02-10 MED ORDER — PREDNISONE 5 MG PO TABS: 5.0000 mg | ORAL_TABLET | Freq: Every day | ORAL | 0 refills | Status: DC

## 2023-02-10 NOTE — Telephone Encounter (Signed)
Patient had OV with Dr. Everardo All today and stated she has not received Harrington Challenger since December. Based on dispense history, one shipment was sent out in January 2024 (would be Week 4 dose)  Her PA expires mid-June. I have asked patient to call [facility]. She will call pharmacy today. Provided hre with my direct # to call me back ASAP if any issues  Chesley Mires, PharmD, MPH, BCPS, CPP Clinical Pharmacist (Rheumatology and Pulmonology)

## 2023-02-10 NOTE — Patient Instructions (Addendum)
Severe Persistent Eosinophilic Asthma --STOP Breztri --START Advair 250-50 mcg ONE puff in the morning and evening. Rinse mouth out after use --CONTINUE Albuterol as needed for shortness of breath or wheezing --Pharmacy team will contact you regarding Fasenra  Chronic steroid use --Reduce to 10 mg daily for two weeks. After this reduce to 5 mg for two weeks

## 2023-02-10 NOTE — Telephone Encounter (Signed)
Spoke with the pt and scheduled for appt with Dr Everardo All today at 2:15 pm.

## 2023-02-10 NOTE — Progress Notes (Signed)
Subjective:   PATIENT ID: [name] [name] GENDER: female DOB: 1965/10/28, MRN: [id number]   HPI  Chief Complaint  Patient presents with   Follow-up    Follow up. Patient has sore throat, headache, small cough, and a little fatigue.    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 Started on [place] on 03/16/2019.  Overall well-controlled since initiation of biologic.  She previously had 3 exacerbations a year.  While on Nucala only occurred at most once a year. After missing doses since the fall 2022 she has had worsening symptoms and restarted injections 11/2021.  05/24/22 She was last seen in April x 2 in Pulmonary clinic for exacerbation. Was stepped up to triple therapy. She continues to have periodic but persistent cough throughout the last 4 months. She started Ozempic and lost weight. She is walking daily on treadmill. She has been back on Nucala for several months and overall does not feel she has the same level of symptom control as she did in the past. She reports nasal congestion. Unable to nasal sprays due to hx sinus issues.She has been taking Breztri, Symbicort and Spiriva. She didn't realize she was supposed to stop symbicort and spiriva. She has air purifiers in every room to help with allergies.  09/08/22 Since our last visit she was started on Dupixent in October. Her symptoms have not been controlled and has been developing left facial flushing and rashes around the mouth and abdominal with use. Denies throat swelling, difficulty breathing or wheezing with use. Last injection was in November 14th and has had issues receiving further injections [facility]. She reports coughing, wheezing and shortness of breath x 1 week, using  albuterol 8-9 times day.. Denies fevers, chills.   02/10/23 She has been transitioned from Dupixent to [place] since 09/20/22. However has not been consistently on biologics due to lapse in insurance coverage which patient has had appealed. Currently on Breztri twice a day, prednisone 20 mg daily, albuterol 4-5x days and nebulizer 3-4 times a week. Wakes up 3-4 times a week due to coughing with occasional wheezing.  Has gained >10lbs due to steroids. Denies fevers/chills  2019 Jan Feb Mar April May June July Aug Sept Oct Nov Dec            X X X  2020 Jan Feb Mar April May June July Aug Sept Oct Nov Dec   X    X       X   2021 Jan Feb Mar April May June July Aug Sept Oct Nov Dec                2022 Jan Feb Mar April May June July Aug Sept Oct Nov Dec   X          X   2023 Jan Feb Mar April May June July Aug Sept Oct Nov Dec     XX XX    X    X   Asthma Control Test ACT Total Score  05/24/2022  3:28 PM 14  02/09/2022  9:06 AM 8  01/12/2022  9:09 AM 7   Social History: Non-smoker --She has had recent stressors. Her husband has been diagnosed with Parkinson.  Her father passed this winter in 2021. --Works with youth in foster  care system  Past Medical History:  Diagnosis Date   ADD (attention deficit disorder)    Adrenal gland hypofunction (HCC)    related to post op, vancomycin & lumbar injection, steroids     Anxiety    Arthritis    hnp-lumbar    Asthma    Back pain    Blood dyscrasia    hx probable blood clot after ankle surgery  not definitive but treated per pt   Cancer (HCC)    "early evolvng melanoma "- legs    Depression    Diabetes insipidus (HCC)    Edema of both lower extremities    Family history of adverse reaction to anesthesia    mom has N/V - zofran works   Gallbladder problem    Heart murmur    years ago-echo 05 no problems   High cholesterol    History of blood transfusion    "16 so far" (07/25/2012)   History of bronchitis    History of UTI     Hypertension    no med for 1 yr   Malaria by plasmodium vivax 1999   Menopause    Migraines    Multiple food allergies    Peripheral vascular disease (HCC)    ? dvt 2011 tx as preventive although did not show on scan after ankle surgery   Pneumonia ~ 2009; 2010   PONV (postoperative nausea and vomiting)    last 2 surgeries less nausea   PONV (postoperative nausea and vomiting)    none with last surgery-be extra careful with nose patient haas septal hole    Vitamin D deficiency      Allergies  Allergen Reactions   Amoxicillin Anaphylaxis, Hives and Itching   Black [place] Pollen Allergy Skin Test Anaphylaxis   Penicillins Anaphylaxis    Has patient had a PCN reaction causing immediate rash, facial/tongue/throat swelling, SOB or lightheadedness with hypotension: Yes Has patient had a PCN reaction causing severe rash involving mucus membranes or skin necrosis: No Has patient had a PCN reaction that required hospitalization Yes Has patient had a PCN reaction occurring within the last 10 years: No If all of the above answers are "NO", then may proceed with Cephalosporin use.    Sulfa Antibiotics Hives and Other (See Comments)    Hives, mouth blisters    Chloroquine Other (See Comments)    Renal dysfunction   Ciprofloxacin Hives   Phenergan [Promethazine Hcl] Other (See Comments)    Caused "severe drop in blood pressure."   Sulfamethoxazole Hives   Dupixent [Dupilumab] Other (See Comments)    Unilateral facial rash with possible dry skin development. Resolved after d/c   Other Other (See Comments)    From allergy testing--Canteloupe   Shellfish Allergy Itching and Rash   Vancomycin Cross Reactors Other (See Comments)    "RED MAN SYNDROME"  Pt stated she had no reaction to Vancomycin when it is given slow     Outpatient Medications Prior to Visit  Medication Sig Dispense Refill   ADDERALL XR 20 MG 24 hr capsule Take 20 mg by mouth daily as needed (concentration).  0    albuterol (VENTOLIN HFA) 108 (90 Base) MCG/ACT inhaler Inhale 2 puffs into the lungs every 6 (six) hours as needed for wheezing or shortness of breath. 8 g 2   aspirin EC 81 MG tablet Take 81 mg by mouth daily. Swallow whole.     Benralizumab (FASENRA PEN) 30 MG/ML SOAJ Inject 30mg  into the skin at  Week 4 (10/18/2022). (Received Week 0 dose in clinic on 09/20/2022) 1 mL 0   Benralizumab (FASENRA PEN) 30 MG/ML SOAJ Inject 30mg  into the skin at Week 8 then every 8 weeks thereafter 1 mL 1   buPROPion (WELLBUTRIN XL) 300 MG 24 hr tablet Take 300 mg by mouth daily.     clindamycin (CLEOCIN) 150 MG capsule Take by mouth 3 (three) times daily. Unknown dosage     EPINEPHrine 0.3 mg/0.3 mL IJ SOAJ injection Inject 0.3 mg into the muscle as needed for anaphylaxis. 2 each 2   estradiol (CLIMARA - DOSED IN MG/24 HR) 0.0375 mg/24hr patch Place 0.0375 mg onto the skin once a week. saturdays     ibuprofen (ADVIL) 400 MG tablet Take 400 mg by mouth as needed.     montelukast (SINGULAIR) 10 MG tablet TAKE 1 TABLET BY MOUTH EVERYDAY AT BEDTIME 90 tablet 3   omeprazole (PRILOSEC) 20 MG capsule Take 20 mg by mouth daily.     OVER THE COUNTER MEDICATION Take 6 tablets by mouth daily. Smarty Pants Vitamins     PROAIR HFA 108 (90 Base) MCG/ACT inhaler Inhale 2 puffs into the lungs every 6 (six) hours as needed for wheezing or shortness of breath. 8 g 5   Probiotic CAPS Take 1 capsule by mouth daily.      Respiratory Therapy Supplies (FLUTTER) DEVI Use flutter valve 3 times a day 1 each 0   rosuvastatin (CRESTOR) 20 MG tablet Take 20 mg by mouth daily.     sertraline (ZOLOFT) 50 MG tablet Take 100 mg by mouth daily.     telmisartan (MICARDIS) 40 MG tablet Take 40 mg by mouth daily.     topiramate (TOPAMAX) 50 MG tablet Take 150 mg by mouth daily.     valACYclovir (VALTREX) 1000 MG tablet Take two tablets as a single dose prn cold sore. (Patient taking differently: Take 2,000 mg by mouth daily as needed. Take two tablets  as a single dose prn cold sore.) 20 tablet 1   Budeson-Glycopyrrol-Formoterol (BREZTRI AEROSPHERE) 160-9-4.8 MCG/ACT AERO Inhale 2 puffs into the lungs in the morning and at bedtime. 1 each 11   predniSONE (DELTASONE) 20 MG tablet Take 40 mg by mouth daily with breakfast.     albuterol (PROVENTIL) (2.5 MG/3ML) 0.083% nebulizer solution Take 3 mLs (2.5 mg total) by nebulization every 6 (six) hours as needed for wheezing or shortness of breath. 75 mL 5   No facility-administered medications prior to visit.    Review of Systems  Constitutional:  Negative for chills, diaphoresis, fever, malaise/fatigue and weight loss.  HENT:  Positive for congestion.   Respiratory:  Positive for cough, shortness of breath and wheezing. Negative for hemoptysis and sputum production.   Cardiovascular:  Negative for chest pain, palpitations and leg swelling.     Objective:   Vitals:   02/10/23 1427  BP: 122/78  Pulse: 88  Temp: 98.2 F (36.8 C)  TempSrc: Oral  SpO2: 100%  Weight: 229 lb (103.9 kg)  Height: 5\' 9"  (1.753 m)   SpO2: 100 % O2 Device: None (Room air)  Physical Exam: General: Well-appearing, no acute distress HENT: [place], AT Eyes: EOMI, no scleral icterus Respiratory: Clear to auscultation bilaterally.  No crackles, wheezing or rales Cardiovascular: RRR, -M/R/G, no JVD Extremities:-Edema,-tenderness Neuro: AAO x4, CNII-XII grossly intact Psych: Normal mood, normal affect  Labs Eosinophil 252 (3.4%) 09/25/18  Chest imaging: CXR 09/25/2018-normal chest x-ray with clear lung fields without evidence of infiltrate, effusion  or edema CXR 10/16/2020-no infiltrate or effusion or edema. CT abdomen/pelvis 07/14/2021-lower lung fields with no parenchymal abnormalities, effusion or edema.  Diverticulosis, small umbilical hernia. CXR 01/12/22- No acute abnormalities  PFT:  08/07/2018-Mild bronchial hyper reactivity suggestive of asthma    Assessment & Plan:  57 year old female with severe  persistent asthma who presents for follow-up. Previously well-controlled on Nucala from 01/2019-08/2021. Due to insurance she had to pause but despite restart Nucala in 11/2021, she had breakthrough symptoms and exacerbations. Started on Dupixent in 06/2022 however symptoms remain uncontrolled and having possible drug-related rashes. Received Har[place] Challenger once on 08/2023 however due lapse in insurance coverage reports difficulty with medical bills so unable to continue.   Asthma is uncontrolled unless on chronic prednisone therapy. Worried that she is steroid dependent after several months of unmonitored use. Has had side effects including weight gain. Adverse effects of steroids discussed including weight gain, fluid retention, hypertension, increased risk of infection,and hyperglycemia.   Severe Persistent Eosinophilic Asthma - uncontrolled Asthma exacerbation Steroid dependent --STOP Breztri --START Advair 250-50 mcg ONE puff in the morning and evening. Rinse mouth out after use --CONTINUE Albuterol as needed for shortness of breath or wheezing --Currently on prednisone 20 mg --START azithromycin --Pharmacy team will contact you regarding Fasenra  Chronic steroid use --Reduce to 10 mg daily for two weeks. After this reduce to 5 mg for two weeks  Asthma Action Plan IF you develop asthma symptoms (chest tightness, cough and wheezing), take an extra 2 puffs of Symbicort or Albuterol as needed every 4 hours. Call us for persistent symptoms steroids.  Immunization History  Administered Date(s) Administered   Influenza Split 07/01/2016   Influenza, Quadrivalent, Recombinant, Inj, Pf 05/27/2020, 06/03/2021   Influenza,inj,Quad PF,6+ Mos 07/01/2016, 07/14/2017, 05/27/2019, 05/27/2020   PFIZER(Purple Top)SARS-COV-2 Vaccination 12/12/2019, 01/06/2020, 07/21/2020, 09/25/2020, 04/14/2021   Pneumococcal Polysaccharide-23 06/24/2020, 05/17/2022   Td 06/18/2013   Tdap 06/27/2011, 07/14/2014   Zoster  Recombinat (Shingrix) 02/23/2021, 04/14/2021   No orders of the defined types were placed in this encounter.  Meds ordered this encounter  Medications   fluticasone-salmeterol (ADVAIR) 250-50 MCG/ACT AEPB    Sig: Inhale 1 puff into the lungs in the morning and at bedtime.    Dispense:  60 each    Refill:  5   predniSONE (DELTASONE) 5 MG tablet    Sig: Take 1 tablet (5 mg total) by mouth daily with breakfast.    Dispense:  30 tablet    Refill:  0   azithromycin (ZITHROMAX) 250 MG tablet    Sig: Take two tablets on day 1, then one tablet daily on day 2-5.    Dispense:  6 tablet    Refill:  0    Return in about 1 month (around 03/13/2023).  I have spent a total time of 36-minutes on the day of the appointment including chart review, data review, collecting history, coordinating care and discussing medical diagnosis and plan with the patient/family. Past medical history, allergies, medications were reviewed. Pertinent imaging, labs and tests included in this note have been reviewed and interpreted independently by me.  Austine Wiedeman Mechele Collin, MD [facility] 02/10/2023 2:54 PM

## 2023-02-13 NOTE — Telephone Encounter (Signed)
PA submitted to [facility] via fax. This has been submitted as initial request since there are no notes stating she is doing clinically well on Fasenra  Case # [id number] Phone: [phone] Fax: [phone]  Chesley Mires, PharmD, MPH, BCPS, CPP Clinical Pharmacist (Rheumatology and Pulmonology)

## 2023-02-14 DIAGNOSIS — F900 Attention-deficit hyperactivity disorder, predominantly inattentive type: Secondary | ICD-10-CM | POA: Diagnosis not present

## 2023-02-14 DIAGNOSIS — F330 Major depressive disorder, recurrent, mild: Secondary | ICD-10-CM | POA: Diagnosis not present

## 2023-02-22 DIAGNOSIS — L814 Other melanin hyperpigmentation: Secondary | ICD-10-CM | POA: Diagnosis not present

## 2023-02-22 DIAGNOSIS — L821 Other seborrheic keratosis: Secondary | ICD-10-CM | POA: Diagnosis not present

## 2023-02-22 DIAGNOSIS — Z872 Personal history of diseases of the skin and subcutaneous tissue: Secondary | ICD-10-CM | POA: Diagnosis not present

## 2023-02-22 DIAGNOSIS — Z08 Encounter for follow-up examination after completed treatment for malignant neoplasm: Secondary | ICD-10-CM | POA: Diagnosis not present

## 2023-02-22 DIAGNOSIS — Z86006 Personal history of melanoma in-situ: Secondary | ICD-10-CM | POA: Diagnosis not present

## 2023-02-22 DIAGNOSIS — D1801 Hemangioma of skin and subcutaneous tissue: Secondary | ICD-10-CM | POA: Diagnosis not present

## 2023-02-22 DIAGNOSIS — L817 Pigmented purpuric dermatosis: Secondary | ICD-10-CM | POA: Diagnosis not present

## 2023-02-27 DIAGNOSIS — M21371 Foot drop, right foot: Secondary | ICD-10-CM | POA: Diagnosis not present

## 2023-02-28 DIAGNOSIS — F330 Major depressive disorder, recurrent, mild: Secondary | ICD-10-CM | POA: Diagnosis not present

## 2023-02-28 DIAGNOSIS — F900 Attention-deficit hyperactivity disorder, predominantly inattentive type: Secondary | ICD-10-CM | POA: Diagnosis not present

## 2023-03-06 ENCOUNTER — Telehealth: Payer: Self-pay | Admitting: Pulmonary Disease

## 2023-03-06 DIAGNOSIS — J4550 Severe persistent asthma, uncomplicated: Secondary | ICD-10-CM

## 2023-03-06 NOTE — Telephone Encounter (Signed)
Pharmacy calling in bc start dose medicine for pt has not been approved by her ins [employer] Spoke wi Bonita Quin

## 2023-03-08 NOTE — Telephone Encounter (Signed)
Pt's current PA is set to expire on 03/11/23. Pt has been noncompliant with medication and will need to be treated as if she were a new start so that she can receive approval for additional loading doses.   Submitted a Prior Authorization request to [facility] for [facility] via CoverMyMeds, however request was cancelled due to "no additional PA being required". Will attempt resubmission after her currently active authorization has officially expired, if that attempt fails then her insurance plan will need to be contacted for assistance directly.  Key: [id number]

## 2023-03-22 ENCOUNTER — Ambulatory Visit (HOSPITAL_BASED_OUTPATIENT_CLINIC_OR_DEPARTMENT_OTHER): Payer: 59 | Admitting: Pulmonary Disease

## 2023-03-22 ENCOUNTER — Encounter (HOSPITAL_BASED_OUTPATIENT_CLINIC_OR_DEPARTMENT_OTHER): Payer: Self-pay | Admitting: Pulmonary Disease

## 2023-03-22 VITALS — BP 128/84 | HR 98 | Ht 69.0 in | Wt 230.0 lb

## 2023-03-22 DIAGNOSIS — J4550 Severe persistent asthma, uncomplicated: Secondary | ICD-10-CM | POA: Diagnosis not present

## 2023-03-22 MED ORDER — FASENRA PEN 30 MG/ML ~~LOC~~ SOAJ: SUBCUTANEOUS | 1 refills | Status: DC

## 2023-03-22 NOTE — Patient Instructions (Addendum)
Severe Persistent Eosinophilic Asthma - uncontrolled, not in exacerbation Steroid dependent --CONTINUE Advair 250-50 mcg ONE puff in the morning and evening. Rinse mouth out after use --CONTINUE Albuterol as needed for shortness of breath or wheezing --Off prednisone now --I will contact Pharmacy team will contact you regarding Harrington Challenger --I will also contact Fasenra rep  Chronic steroid use --Off prednisone --If needed again, prefer to maintain reduced dosage of prednisone to 5 mg daily

## 2023-03-22 NOTE — Telephone Encounter (Signed)
Submitted an URGENT Prior Authorization request to [facility] for [facility] via CoverMyMeds. Submitted as a new-start due to only receiving a single dose several months ago. Will update once we receive a response.  Key: Clent Demark

## 2023-03-22 NOTE — Progress Notes (Signed)
Subjective:   PATIENT ID: [name] [name] GENDER: female DOB: 1966/03/04, MRN: [id number]   HPI  Chief Complaint  Patient presents with   Follow-up    F/U for asthma. States she still has not been able to receive her Harrington Challenger injection due to insurance.    Ms. 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 Started on [place] on 03/16/2019.  Overall well-controlled since initiation of biologic.  She previously had 3 exacerbations a year.  While on Nucala only occurred at most once a year. After missing doses since the fall 2022 she has had worsening symptoms and restarted injections 11/2021.  05/24/22 She was last seen in April x 2 in Pulmonary clinic for exacerbation. Was stepped up to triple therapy. She continues to have periodic but persistent cough throughout the last 4 months. She started Ozempic and lost weight. She is walking daily on treadmill. She has been back on Nucala for several months and overall does not feel she has the same level of symptom control as she did in the past. She reports nasal congestion. Unable to nasal sprays due to hx sinus issues.She has been taking Breztri, Symbicort and Spiriva. She didn't realize she was supposed to stop symbicort and spiriva. She has air purifiers in every room to help with allergies.  09/08/22 Since our last visit she was started on Dupixent in October. Her symptoms have not been controlled and has been developing left facial flushing and rashes around the mouth and abdominal with use. Denies throat swelling, difficulty breathing or wheezing with use. Last injection was in November 14th and has had issues receiving further injections [facility]. She reports coughing, wheezing and shortness of  breath x 1 week, using albuterol 8-9 times day.. Denies fevers, chills.   02/10/23 She has been transitioned from Dupixent to Eva since 09/20/22. However has not been consistently on biologics due to lapse in insurance coverage which patient has had appealed. Currently on Breztri twice a day, prednisone 20 mg daily, albuterol 4-5x days and nebulizer 3-4 times a week. Wakes up 3-4 times a week due to coughing with occasional wheezing.  Has gained >10lbs due to steroids. Denies fevers/chills  03/22/23 She has not received her Harrington Challenger due to insurance approval. She reports daily coughing and occasional wheezing that has worsened. Compliant with Advair and using albuterol at least 3 times daily. Has significant sinus drainage. She has minimized her time outdoors. She has weaned off the prednisone 5 mg daily but feeling fatigued. Recently the bone graft she had completed with her oral surgeon did not take and concerned this is related to chronic steroid use.   2019 Jan Feb Mar April May June July Aug Sept Oct Nov Dec            X X X  2020 Jan Feb Mar April May June July Aug Sept Oct Nov Dec   X    X       X   2021 Jan Feb Mar April May June July Aug Sept Oct Nov Dec                2022 Jan Feb Mar April May June July Aug Sept Oct Nov Dec   X          X  2023 Jan Feb Mar April May June July Aug Sept Oct Nov Dec     XX XX    X    X   Asthma Control Test ACT Total Score  05/24/2022  3:28 PM 14  02/09/2022  9:06 AM 8  01/12/2022  9:09 AM 7   Social History: Non-smoker --She has had recent stressors. Her husband has been diagnosed with Parkinson.  Her father passed this winter in 2021. 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HCC)    related to post op, vancomycin & lumbar injection, steroids     Anxiety    Arthritis    hnp-lumbar    Asthma    Back pain    Blood dyscrasia    hx  probable blood clot after ankle surgery  not definitive but treated per pt   Cancer (HCC)    "early evolvng melanoma "- legs    Depression    Diabetes insipidus (HCC)    Edema of both lower extremities    Family history of adverse reaction to anesthesia    mom has N/V - zofran works   Gallbladder problem    Heart murmur    years ago-echo 05 no problems   High cholesterol    History of blood transfusion    "16 so far" (07/25/2012)   History of bronchitis    History of UTI    Hypertension    no med for 1 yr   Malaria by plasmodium vivax 1999   Menopause    Migraines    Multiple food allergies    Peripheral vascular disease (HCC)    ? dvt 2011 tx as preventive although did not show on scan after ankle surgery   Pneumonia ~ 2009; 2010   PONV (postoperative nausea and vomiting)    last 2 surgeries less nausea   PONV (postoperative nausea and vomiting)    none with last surgery-be extra careful with nose patient haas septal hole    Vitamin D deficiency      Allergies  Allergen Reactions   Amoxicillin Anaphylaxis, Hives and Itching   Black Walnut Pollen Allergy Skin Test Anaphylaxis   Penicillins Anaphylaxis    Has patient had a PCN reaction causing immediate rash, facial/tongue/throat swelling, SOB or lightheadedness with hypotension: Yes Has patient had a PCN reaction causing severe rash involving mucus membranes or skin necrosis: No Has patient had a PCN reaction that required hospitalization Yes Has patient had a PCN reaction occurring within the last 10 years: No If all of the above answers are "NO", then may proceed with Cephalosporin use.    Sulfa Antibiotics Hives and Other (See Comments)    Hives, mouth blisters    Chloroquine Other (See Comments)    Renal dysfunction   Ciprofloxacin Hives   Phenergan [Promethazine Hcl] Other (See Comments)    Caused "severe drop in blood pressure."   Sulfamethoxazole Hives   Dupixent [Dupilumab] Other (See Comments)     Unilateral facial rash with possible dry skin development. Resolved after d/c   Other Other (See Comments)    From allergy testing--Canteloupe   Shellfish Allergy Itching and Rash   Vancomycin Cross Reactors Other (See Comments)    "RED MAN SYNDROME"  Pt stated she had no reaction to Vancomycin when it is given slow     Outpatient Medications Prior to Visit  Medication Sig Dispense  Refill   ADDERALL XR 20 MG 24 hr capsule Take 20 mg by mouth daily as needed (concentration).  0   albuterol (VENTOLIN HFA) 108 (90 Base) MCG/ACT inhaler Inhale 2 puffs into the lungs every 6 (six) hours as needed for wheezing or shortness of breath. 8 g 2   aspirin EC 81 MG tablet Take 81 mg by mouth daily. Swallow whole.     Benralizumab (FASENRA PEN) 30 MG/ML SOAJ Inject 30mg  into the skin at Week 4 (10/18/2022). (Received Week 0 dose in clinic on 09/20/2022) 1 mL 0   Benralizumab (FASENRA PEN) 30 MG/ML SOAJ Inject 30mg  into the skin at Week 8 then every 8 weeks thereafter 1 mL 1   buPROPion (WELLBUTRIN XL) 300 MG 24 hr tablet Take 300 mg by mouth daily.     clindamycin (CLEOCIN) 150 MG capsule Take by mouth 3 (three) times daily. Unknown dosage     EPINEPHrine 0.3 mg/0.3 mL IJ SOAJ injection Inject 0.3 mg into the muscle as needed for anaphylaxis. 2 each 2   estradiol (CLIMARA - DOSED IN MG/24 HR) 0.0375 mg/24hr patch Place 0.0375 mg onto the skin once a week. saturdays     fluticasone-salmeterol (ADVAIR) 250-50 MCG/ACT AEPB Inhale 1 puff into the lungs in the morning and at bedtime. 60 each 5   ibuprofen (ADVIL) 400 MG tablet Take 400 mg by mouth as needed.     montelukast (SINGULAIR) 10 MG tablet TAKE 1 TABLET BY MOUTH EVERYDAY AT BEDTIME 90 tablet 3   omeprazole (PRILOSEC) 20 MG capsule Take 20 mg by mouth daily.     OVER THE COUNTER MEDICATION Take 6 tablets by mouth daily. Smarty Pants Vitamins     PROAIR HFA 108 (90 Base) MCG/ACT inhaler Inhale 2 puffs into the lungs every 6 (six) hours as needed for  wheezing or shortness of breath. 8 g 5   Probiotic CAPS Take 1 capsule by mouth daily.      Respiratory Therapy Supplies (FLUTTER) DEVI Use flutter valve 3 times a day 1 each 0   rosuvastatin (CRESTOR) 20 MG tablet Take 20 mg by mouth daily.     sertraline (ZOLOFT) 50 MG tablet Take 100 mg by mouth daily.     telmisartan (MICARDIS) 40 MG tablet Take 40 mg by mouth daily.     topiramate (TOPAMAX) 50 MG tablet Take 150 mg by mouth daily.     valACYclovir (VALTREX) 1000 MG tablet Take two tablets as a single dose prn cold sore. (Patient taking differently: Take 2,000 mg by mouth daily as needed. Take two tablets as a single dose prn cold sore.) 20 tablet 1   albuterol (PROVENTIL) (2.5 MG/3ML) 0.083% nebulizer solution Take 3 mLs (2.5 mg total) by nebulization every 6 (six) hours as needed for wheezing or shortness of breath. 75 mL 5   azithromycin (ZITHROMAX) 250 MG tablet Take two tablets on day 1, then one tablet daily on day 2-5. 6 tablet 0   predniSONE (DELTASONE) 5 MG tablet Take 1 tablet (5 mg total) by mouth daily with breakfast. 30 tablet 0   No facility-administered medications prior to visit.    Review of Systems  Constitutional:  Negative for chills, diaphoresis, fever, malaise/fatigue and weight loss.  HENT:  Negative for congestion.   Respiratory:  Positive for cough, sputum production and shortness of breath. Negative for hemoptysis and wheezing.   Cardiovascular:  Negative for chest pain, palpitations and leg swelling.     Objective:   Vitals:  03/22/23 1010  Pulse: 98  SpO2: 98%  Weight: 230 lb (104.3 kg)  Height: 5\' 9"  (1.753 m)   SpO2: 98 % (on RA)  Physical Exam: General: Well-appearing, no acute distress HENT: [place], AT Eyes: EOMI, no scleral icterus Respiratory:Clear to auscultation bilaterally.  No crackles, wheezing or rales Cardiovascular: RRR, -M/R/G, no JVD Extremities:-Edema,-tenderness Neuro: AAO x4, CNII-XII grossly intact Psych: Normal mood, normal  affect  Labs Eosinophil 252 (3.4%) 09/25/18  Chest imaging: CXR 09/25/2018-normal chest x-ray with clear lung fields without evidence of infiltrate, effusion or edema CXR 10/16/2020-no infiltrate or effusion or edema. CT abdomen/pelvis 07/14/2021-lower lung fields with no parenchymal abnormalities, effusion or edema.  Diverticulosis, small umbilical hernia. CXR 01/12/22- No acute abnormalities  PFT:  08/07/2018-Mild bronchial hyper reactivity suggestive of asthma    Assessment & Plan:  57 year old female with severe persistent asthma who presents for follow-up for asthma. Uncontrolled asthma off biologics. Previously well-controlled on Nucala from 01/2019-08/2021. Due to insurance she had to pause but despite restart Nucala in 11/2021, she had breakthrough symptoms and exacerbations. Started on Dupixent in 06/2022 however symptoms remain uncontrolled and having possible drug-related rashes. Received Harrington Challenger once on 08/2023 however due lapse in insurance coverage reports difficulty with medical bills so unable to continue.   Insurance reports patient as non-compliant HOWEVER she is only non-adherent due to lapse in insurance coverage, financial difficulties and social hardships with her husbands' chronic illnesses. Patient was previously adherent when insurance provided coverage.   Adverse effects of steroids discussed including bruising, weight gain, fluid retention, Cushingoid features, hypertension, cardiac arrhythmias, osteoporosis, gastric ulcers, increased risk of infection, insomnia and hyperglycemia.  Breztri - previously tried  Severe Persistent Eosinophilic Asthma - uncontrolled, not in exacerbation Steroid dependent --CONTINUE Advair 250-50 mcg ONE puff in the morning and evening. Rinse mouth out after use --CONTINUE Albuterol as needed for shortness of breath or wheezing --Off prednisone now --I will contact Pharmacy team will contact you regarding Harrington Challenger --I will also contact  Fasenra rep  Chronic steroid use --Off prednisone --If needed again, prefer to maintain reduced dosage of prednisone to 5 mg daily  Asthma Action Plan IF you develop asthma symptoms (chest tightness, cough and wheezing), take an extra 2 puffs of Symbicort or Albuterol as needed every 4 hours. Call us for persistent symptoms steroids.  Immunization History  Administered Date(s) Administered   Influenza Split 07/01/2016   Influenza, Quadrivalent, Recombinant, Inj, Pf 05/27/2020, 06/03/2021   Influenza,inj,Quad PF,6+ Mos 07/01/2016, 07/14/2017, 05/27/2019, 05/27/2020   PFIZER(Purple Top)SARS-COV-2 Vaccination 12/12/2019, 01/06/2020, 07/21/2020, 09/25/2020, 04/14/2021   Pneumococcal Polysaccharide-23 06/24/2020, 05/17/2022   Td 06/18/2013   Tdap 06/27/2011, 07/14/2014   Zoster Recombinat (Shingrix) 02/23/2021, 04/14/2021   No orders of the defined types were placed in this encounter.  No orders of the defined types were placed in this encounter.   Return in about 3 months (around 06/22/2023) for for 30 min slot.  I have spent a total time of 45-minutes on the day of the appointment including chart review, data review, collecting history, coordinating care and discussing medical diagnosis and plan with the patient/family. Past medical history, allergies, medications were reviewed. Pertinent imaging, labs and tests included in this note have been reviewed and interpreted independently by me.  Jenna Ardoin Mechele Collin, MD [facility] 03/22/2023 11:02 AM

## 2023-03-22 NOTE — Telephone Encounter (Signed)
Received notification from [facility] regarding a prior authorization for [facility]. Authorization has been APPROVED from 03/22/2023 to 09/21/2023. Approval letter sent to scan center.  [facility] must fill [facility]: [phone]  Authorization # [id number] [facility]

## 2023-03-23 NOTE — Telephone Encounter (Signed)
[facility]oke to [facility], per rep, patient's Harrington Challenger is scheduled to be shipped to home and will be delivered tomorrow.  Spoke to patient and told her that she will need to start [facility] with a loading dose schedule. Sent MyChart message with details regarding dates for loading doses.   Darolyn Rua, PharmD Student [employer]

## 2023-03-23 NOTE — Progress Notes (Signed)
Spoke with [facility] now that loading dose is approved. Fasenra shipment is scheduled to arrive to her home on 03/24/23  Chesley Mires, PharmD, MPH, BCPS, CPP Clinical Pharmacist (Rheumatology and Pulmonology)

## 2023-03-24 ENCOUNTER — Telehealth (HOSPITAL_BASED_OUTPATIENT_CLINIC_OR_DEPARTMENT_OTHER): Payer: Self-pay | Admitting: Pulmonary Disease

## 2023-03-24 NOTE — Telephone Encounter (Signed)
Patient states she just missed a call from Dr. Everardo All? Did not see any documentation in patients chart and advised patient that if needed Dr. Everardo All would call. Please advise.

## 2023-03-27 NOTE — Telephone Encounter (Signed)
Pharmacy is filling the loading doses 1 at a time. Her 1 pen was processed through insurance as 28 day supply.

## 2023-03-27 NOTE — Telephone Encounter (Signed)
Called patient, left message.

## 2023-04-03 DIAGNOSIS — F330 Major depressive disorder, recurrent, mild: Secondary | ICD-10-CM | POA: Diagnosis not present

## 2023-04-03 DIAGNOSIS — F900 Attention-deficit hyperactivity disorder, predominantly inattentive type: Secondary | ICD-10-CM | POA: Diagnosis not present

## 2023-04-20 DIAGNOSIS — M5416 Radiculopathy, lumbar region: Secondary | ICD-10-CM | POA: Diagnosis not present

## 2023-05-01 ENCOUNTER — Other Ambulatory Visit: Payer: Self-pay | Admitting: Pulmonary Disease

## 2023-05-01 DIAGNOSIS — J4550 Severe persistent asthma, uncomplicated: Secondary | ICD-10-CM

## 2023-05-04 DIAGNOSIS — F900 Attention-deficit hyperactivity disorder, predominantly inattentive type: Secondary | ICD-10-CM | POA: Diagnosis not present

## 2023-05-04 DIAGNOSIS — F330 Major depressive disorder, recurrent, mild: Secondary | ICD-10-CM | POA: Diagnosis not present

## 2023-05-09 ENCOUNTER — Other Ambulatory Visit: Payer: Self-pay | Admitting: Pulmonary Disease

## 2023-05-30 DIAGNOSIS — M5416 Radiculopathy, lumbar region: Secondary | ICD-10-CM | POA: Diagnosis not present

## 2023-06-01 DIAGNOSIS — F900 Attention-deficit hyperactivity disorder, predominantly inattentive type: Secondary | ICD-10-CM | POA: Diagnosis not present

## 2023-06-01 DIAGNOSIS — F330 Major depressive disorder, recurrent, mild: Secondary | ICD-10-CM | POA: Diagnosis not present

## 2023-06-12 DIAGNOSIS — I10 Essential (primary) hypertension: Secondary | ICD-10-CM | POA: Diagnosis not present

## 2023-06-12 DIAGNOSIS — R7301 Impaired fasting glucose: Secondary | ICD-10-CM | POA: Diagnosis not present

## 2023-06-12 DIAGNOSIS — E785 Hyperlipidemia, unspecified: Secondary | ICD-10-CM | POA: Diagnosis not present

## 2023-06-12 DIAGNOSIS — M8580 Other specified disorders of bone density and structure, unspecified site: Secondary | ICD-10-CM | POA: Diagnosis not present

## 2023-06-14 DIAGNOSIS — E785 Hyperlipidemia, unspecified: Secondary | ICD-10-CM | POA: Diagnosis not present

## 2023-06-14 DIAGNOSIS — Z1331 Encounter for screening for depression: Secondary | ICD-10-CM | POA: Diagnosis not present

## 2023-06-14 DIAGNOSIS — R7301 Impaired fasting glucose: Secondary | ICD-10-CM | POA: Diagnosis not present

## 2023-06-14 DIAGNOSIS — J4550 Severe persistent asthma, uncomplicated: Secondary | ICD-10-CM | POA: Diagnosis not present

## 2023-06-14 DIAGNOSIS — M5126 Other intervertebral disc displacement, lumbar region: Secondary | ICD-10-CM | POA: Diagnosis not present

## 2023-06-14 DIAGNOSIS — I10 Essential (primary) hypertension: Secondary | ICD-10-CM | POA: Diagnosis not present

## 2023-06-14 DIAGNOSIS — R011 Cardiac murmur, unspecified: Secondary | ICD-10-CM | POA: Diagnosis not present

## 2023-06-14 DIAGNOSIS — M8580 Other specified disorders of bone density and structure, unspecified site: Secondary | ICD-10-CM | POA: Diagnosis not present

## 2023-06-14 DIAGNOSIS — Z0000 Encounter for general adult medical examination without abnormal findings: Secondary | ICD-10-CM | POA: Diagnosis not present

## 2023-06-14 DIAGNOSIS — Z1339 Encounter for screening examination for other mental health and behavioral disorders: Secondary | ICD-10-CM | POA: Diagnosis not present

## 2023-06-14 DIAGNOSIS — F331 Major depressive disorder, recurrent, moderate: Secondary | ICD-10-CM | POA: Diagnosis not present

## 2023-06-14 DIAGNOSIS — R82998 Other abnormal findings in urine: Secondary | ICD-10-CM | POA: Diagnosis not present

## 2023-06-20 ENCOUNTER — Other Ambulatory Visit: Payer: Self-pay | Admitting: Pulmonary Disease

## 2023-06-20 DIAGNOSIS — J42 Unspecified chronic bronchitis: Secondary | ICD-10-CM

## 2023-06-20 DIAGNOSIS — R0609 Other forms of dyspnea: Secondary | ICD-10-CM

## 2023-07-04 DIAGNOSIS — F330 Major depressive disorder, recurrent, mild: Secondary | ICD-10-CM | POA: Diagnosis not present

## 2023-08-02 DIAGNOSIS — F330 Major depressive disorder, recurrent, mild: Secondary | ICD-10-CM | POA: Diagnosis not present

## 2023-08-17 ENCOUNTER — Telehealth: Payer: Self-pay | Admitting: Pulmonary Disease

## 2023-08-17 NOTE — Telephone Encounter (Signed)
Disregard. PT needs acute appt. Will watch for avail.

## 2023-08-18 ENCOUNTER — Telehealth (INDEPENDENT_AMBULATORY_CARE_PROVIDER_SITE_OTHER): Payer: 59 | Admitting: Pulmonary Disease

## 2023-08-18 DIAGNOSIS — J4551 Severe persistent asthma with (acute) exacerbation: Secondary | ICD-10-CM

## 2023-08-18 MED ORDER — ZITHROMAX Z-PAK 250 MG PO TABS: ORAL_TABLET | ORAL | 0 refills | Status: DC

## 2023-08-18 MED ORDER — PREDNISONE 10 MG PO TABS: 40.0000 mg | ORAL_TABLET | Freq: Every day | ORAL | 0 refills | Status: AC

## 2023-08-18 NOTE — Patient Instructions (Signed)
VISIT SUMMARY:  Today, you were seen for an exacerbation of your asthma symptoms, including increased coughing, more frequent use of your rescue inhaler, and possible fever. You mentioned that your cough has changed from dry to productive with gold-colored mucus, and you are due for your Fasenra injection. This time of year is typically challenging for you due to allergies.  YOUR PLAN:  -ASTHMA EXACERBATION: An asthma exacerbation is a worsening of asthma symptoms, which can include increased coughing, wheezing, and shortness of breath. To manage this, you will start taking Prednisone 40mg  daily for 5 days and Azithromycin (Z-Pak) to address any possible infection. Continue using Advair and Singulair as prescribed. Please notify the office if your symptoms worsen or do not improve.  INSTRUCTIONS:  Follow up with Dr. Everardo All in 3 months.

## 2023-08-18 NOTE — Progress Notes (Addendum)
[name] [name]    [id number]    09-13-1966  Primary Care Physician:Avva, Joylene Draft, MD  Referring Physician: Chilton Greathouse, MD [address] [place],  [place] [address]  Virtual Visit via Video Note  I connected with [name] [name] on 08/30/23 at  2:45 PM EST by a video enabled telemedicine application and verified that I am speaking with the correct person using two identifiers.  Location: Patient: Home Provider: Pulmonary clinic   I discussed the limitations of evaluation and management by telemedicine and the availability of in person appointments. The patient expressed understanding and agreed to proceed.  Chief complaint:  Acute visit for asthma exacerbation  HPI: 57 y.o. who  has a past medical history of ADD (attention deficit disorder), Adrenal gland hypofunction (HCC), Anxiety, Arthritis, Asthma, Back pain, Blood dyscrasia, Cancer (HCC), Depression, Diabetes insipidus (HCC), Edema of both lower extremities, Family history of adverse reaction to anesthesia, Gallbladder problem, Heart murmur, High cholesterol, History of blood transfusion, History of bronchitis, History of UTI, Hypertension, Malaria by plasmodium vivax (1999), Menopause, Migraines, Multiple food allergies, Peripheral vascular disease (HCC), Pneumonia (~ 2009; 2010), PONV (postoperative nausea and vomiting), PONV (postoperative nausea and vomiting), and Vitamin D deficiency.   Discussed the use of AI scribe software for clinical note transcription with the patient, who gave verbal consent to proceed.  [name] [name], a patient with a history of asthma, presents with an exacerbation of symptoms. She reports an increase in coughing over the past week, progressing from a dry, hacking cough to a productive cough with gold-colored mucus. She has also been requiring her rescue inhaler more frequently, now needing it every couple of hours. She reports feeling unwell, with symptoms of sweating and alternating hot  and cold sensations, suggesting a possible fever. She is due for her Harrington Challenger injection, which she believes may be contributing to her current symptoms. She also notes that this is typically a difficult time of year for her due to allergies. She is currently on Advair, Singulair, and a rescue inhaler, and has recently been able to discontinue daily prednisone.   Outpatient Encounter Medications as of 08/18/2023  Medication Sig   predniSONE (DELTASONE) 10 MG tablet Take 4 tablets (40 mg total) by mouth daily with breakfast for 5 days.   ZITHROMAX Z-PAK 250 MG tablet Take 500 mg on day 1, followed by 250 mg once daily on days 2 to 5   ADDERALL XR 20 MG 24 hr capsule Take 20 mg by mouth daily as needed (concentration).   albuterol (PROVENTIL) (2.5 MG/3ML) 0.083% nebulizer solution Take 3 mLs (2.5 mg total) by nebulization every 6 (six) hours as needed for wheezing or shortness of breath.   albuterol (VENTOLIN HFA) 108 (90 Base) MCG/ACT inhaler INHALE TWO PUFFS BY MOUTH EVERY 6 HOURS AS NEEDED FOR WHEEZING OR FOR SHORTNESS OF BREATH   aspirin EC 81 MG tablet Take 81 mg by mouth daily. Swallow whole.   benralizumab (FASENRA PEN) 30 MG/ML prefilled autoinjector Inject 30mg  into the skin at Week 0, Week 4   benralizumab (FASENRA PEN) 30 MG/ML prefilled autoinjector Inject 30mg  into the skin at Week 8 then every 8 weeks thereafter   benralizumab (FASENRA PEN) 30 MG/ML prefilled autoinjector INJECT 1 PEN UNDER THE SKIN AT WEEK 4 AND 8, THEN INJECT 1 PEN EVERY 8 WEEKS THEREAFTER   buPROPion (WELLBUTRIN XL) 300 MG 24 hr tablet Take 300 mg by mouth daily.   clindamycin (CLEOCIN) 150 MG capsule Take  by mouth 3 (three) times daily. Unknown dosage   EPINEPHrine 0.3 mg/0.3 mL IJ SOAJ injection Inject 0.3 mg into the muscle as needed for anaphylaxis.   estradiol (CLIMARA - DOSED IN MG/24 HR) 0.0375 mg/24hr patch Place 0.0375 mg onto the skin once a week. saturdays   fluticasone-salmeterol (ADVAIR) 250-50 MCG/ACT AEPB  Inhale 1 puff into the lungs in the morning and at bedtime.   ibuprofen (ADVIL) 400 MG tablet Take 400 mg by mouth as needed.   montelukast (SINGULAIR) 10 MG tablet Take 1 tablet (10 mg total) by mouth at bedtime. TAKE ONE TABLET BY MOUTH EVERY NIGHT AT BEDTIME   omeprazole (PRILOSEC) 20 MG capsule Take 20 mg by mouth daily.   OVER THE COUNTER MEDICATION Take 6 tablets by mouth daily. Smarty Pants Vitamins   PROAIR HFA 108 (90 Base) MCG/ACT inhaler Inhale 2 puffs into the lungs every 6 (six) hours as needed for wheezing or shortness of breath.   Probiotic CAPS Take 1 capsule by mouth daily.    Respiratory Therapy Supplies (FLUTTER) DEVI Use flutter valve 3 times a day   rosuvastatin (CRESTOR) 20 MG tablet Take 20 mg by mouth daily.   sertraline (ZOLOFT) 50 MG tablet Take 100 mg by mouth daily.   telmisartan (MICARDIS) 40 MG tablet Take 40 mg by mouth daily.   topiramate (TOPAMAX) 50 MG tablet Take 150 mg by mouth daily.   valACYclovir (VALTREX) 1000 MG tablet Take two tablets as a single dose prn cold sore. (Patient taking differently: Take 2,000 mg by mouth daily as needed. Take two tablets as a single dose prn cold sore.)   No facility-administered encounter medications on file as of 08/18/2023.    Physical Exam: Tele  Data Reviewed: Imaging: Chest x-ray 01/12/2022-no acute cardiopulmonary process.  I have reviewed the images personally.  Assessment and Plan Asthma exacerbation Increased cough, use of rescue inhaler, and possible fever. Delay in [place] injection.  She is due to get her on Fasenra shipped in the next few days.  She has recent transition from dry to productive cough with gold-colored mucus.  -Start Prednisone 40mg  daily for 5 days. -Start Azithromycin (Z-Pak) due to possible infection. -Continue Advair and Singulair as prescribed. -Notify the office if symptoms worsen or do not improve.  Follow-up with Dr. Everardo All in 3 months.   I discussed the assessment and  treatment plan with the patient. The patient was provided an opportunity to ask questions and all were answered. The patient agreed with the plan and demonstrated an understanding of the instructions.   The patient was advised to call back or seek an in-person evaluation if the symptoms worsen or if the condition fails to improve as anticipated.  Chilton Greathouse MD [facility] 08/18/2023, 3:13 PM  CC: Chilton Greathouse, MD

## 2023-08-22 ENCOUNTER — Telehealth: Payer: Self-pay | Admitting: Pharmacist

## 2023-08-22 NOTE — Telephone Encounter (Signed)
Submitted a Prior Authorization renewal request to [facility] for [facility] via FAX. Will update once we receive a response.  Fax: [phone] Phone: [phone] Case # [id number]

## 2023-08-23 NOTE — Telephone Encounter (Signed)
Received fax from [facility] requesting documentation showing that pt's asthma control has improved since initiation of medication, as demonstrated by a reduction in the frequency and/or severity of symptoms and exacerbations.   We are unable to provide such documentation at this time, as the most recent chart notes document an exacerbation of symptoms (which the pt states she feels the Harrington Challenger is responsible for) and the previous chart notes indicate therapy noncompliance.  PA renewal is extremely unlikely at this time.

## 2023-08-26 DIAGNOSIS — M25811 Other specified joint disorders, right shoulder: Secondary | ICD-10-CM | POA: Diagnosis not present

## 2023-08-26 DIAGNOSIS — R29898 Other symptoms and signs involving the musculoskeletal system: Secondary | ICD-10-CM | POA: Diagnosis not present

## 2023-08-26 DIAGNOSIS — M25511 Pain in right shoulder: Secondary | ICD-10-CM | POA: Diagnosis not present

## 2023-08-26 DIAGNOSIS — M5412 Radiculopathy, cervical region: Secondary | ICD-10-CM | POA: Diagnosis not present

## 2023-08-30 DIAGNOSIS — F330 Major depressive disorder, recurrent, mild: Secondary | ICD-10-CM | POA: Diagnosis not present

## 2023-08-30 NOTE — Telephone Encounter (Signed)
Will wait until OV on 09/29/2023 to renew PA> Hopefully she reports doing well at that visit so we have clinicals to submit supporting continued use of Harrington Challenger

## 2023-08-30 NOTE — Telephone Encounter (Signed)
Received a fax regarding Prior Authorization from [facility] for [facility]. Authorization has been DENIED because chart notes could not be submitted that demonstrate improvement in asthma symptoms.  Will route to [facility] in order to determine how best to proceed.

## 2023-09-16 DIAGNOSIS — J4541 Moderate persistent asthma with (acute) exacerbation: Secondary | ICD-10-CM | POA: Diagnosis not present

## 2023-09-16 DIAGNOSIS — R059 Cough, unspecified: Secondary | ICD-10-CM | POA: Diagnosis not present

## 2023-09-16 DIAGNOSIS — R062 Wheezing: Secondary | ICD-10-CM | POA: Diagnosis not present

## 2023-09-16 DIAGNOSIS — J069 Acute upper respiratory infection, unspecified: Secondary | ICD-10-CM | POA: Diagnosis not present

## 2023-09-21 ENCOUNTER — Other Ambulatory Visit: Payer: Self-pay | Admitting: Pulmonary Disease

## 2023-09-21 DIAGNOSIS — J4550 Severe persistent asthma, uncomplicated: Secondary | ICD-10-CM

## 2023-09-29 ENCOUNTER — Encounter (HOSPITAL_BASED_OUTPATIENT_CLINIC_OR_DEPARTMENT_OTHER): Payer: Self-pay | Admitting: Pulmonary Disease

## 2023-09-29 ENCOUNTER — Ambulatory Visit (HOSPITAL_BASED_OUTPATIENT_CLINIC_OR_DEPARTMENT_OTHER): Payer: 59 | Admitting: Pulmonary Disease

## 2023-09-29 DIAGNOSIS — R0609 Other forms of dyspnea: Secondary | ICD-10-CM

## 2023-09-29 DIAGNOSIS — J42 Unspecified chronic bronchitis: Secondary | ICD-10-CM | POA: Diagnosis not present

## 2023-09-29 DIAGNOSIS — J8283 Eosinophilic asthma: Secondary | ICD-10-CM | POA: Diagnosis not present

## 2023-09-29 MED ORDER — MONTELUKAST SODIUM 10 MG PO TABS: 10.0000 mg | ORAL_TABLET | Freq: Every day | ORAL | 1 refills | Status: AC

## 2023-09-29 MED ORDER — FLUTICASONE-SALMETEROL 250-50 MCG/ACT IN AEPB: 1.0000 | INHALATION_SPRAY | Freq: Two times a day (BID) | RESPIRATORY_TRACT | 11 refills | Status: AC

## 2023-09-29 MED ORDER — ALBUTEROL SULFATE HFA 108 (90 BASE) MCG/ACT IN AERS: 2.0000 | INHALATION_SPRAY | Freq: Four times a day (QID) | RESPIRATORY_TRACT | 5 refills | Status: DC | PRN

## 2023-09-29 MED ORDER — PREDNISONE 10 MG PO TABS: ORAL_TABLET | ORAL | 0 refills | Status: AC

## 2023-09-29 NOTE — Progress Notes (Signed)
 Subjective:   PATIENT ID: [name] [name] GENDER: female DOB: Feb 19, 1966, MRN: [id number]   HPI  Chief Complaint  Patient presents with   Follow-up    Has 2 days of prednisone  left. Got it from ED for  bronchitis and asthma flare up. 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 Started on Nucala  on 03/16/2019.  Overall well-controlled since initiation of biologic.  She previously had 3 exacerbations a year.  While on Nucala  only occurred at most once a year. After missing doses since the fall 2022 she has had worsening symptoms and restarted injections 11/2021.  05/24/22 She was last seen in April x 2 in Pulmonary clinic for exacerbation. Was stepped up to triple therapy. She continues to have periodic but persistent cough throughout the last 4 months. She started Ozempic and lost weight. She is walking daily on treadmill. She has been back on Nucala  for several months and overall does not feel she has the same level of symptom control as she did in the past. She reports nasal congestion. Unable to nasal sprays due to hx sinus issues.She has been taking Breztri , Symbicort  and Spiriva . She didn't realize she was supposed to stop symbicort  and spiriva . She has air purifiers in every room to help with allergies.  09/08/22 Since our last visit she was started on Dupixent  in October. Her symptoms have not been controlled and has been developing left facial flushing and rashes around the mouth and abdominal with use. Denies throat swelling, difficulty breathing or wheezing with use. Last injection was in November 14th and has had issues receiving further injections [facility]. She reports coughing, wheezing and shortness of  breath x 1 week, using albuterol  8-9 times day.. Denies fevers, chills.   02/10/23 She has been transitioned from Dupixent  to Fasenra  since 09/20/22. However has not been consistently on biologics due to lapse in insurance coverage which patient has had appealed. Currently on Breztri  twice a day, prednisone  20 mg daily, albuterol  4-5x days and nebulizer 3-4 times a week. Wakes up 3-4 times a week due to coughing with occasional wheezing.  Has gained >10lbs due to steroids. Denies fevers/chills  03/22/23 She has not received her Fasenra  due to insurance approval. She reports daily coughing and occasional wheezing that has worsened. Compliant with Advair  and using albuterol  at least 3 times daily. Has significant sinus drainage. She has minimized her time outdoors. She has weaned off the prednisone  5 mg daily but feeling fatigued. Recently the bone graft she had completed with her oral surgeon did not take and concerned this is related to chronic steroid use.   09/29/23 Since our last visit she has been seen by urgent care at [facility] and treated for zpack and prednisone  for asthma. exacerbation. Still having low grade fever. Improved but she reports currently productive cough. Wheezing has resolved. Has constant drainage. Some shortness of breath. Requires albuterol  frequently in closed spaces.  2019 Jan Feb Mar April May June July Aug Sept Oct Nov Dec            X X X  2020 Jan Feb Mar April May June July Aug Sept Oct Nov Dec   X    X       X   2021 Jan  Feb Mar April May June July Aug Sept Oct Nov Dec                2022 Jan Feb Mar April May June July Aug Sept Oct Nov Dec   X          X   2023 Jan Feb Mar April May June July Aug Sept Oct Nov Dec     XX XX    X    X   2024 Jan Feb Mar April May June July Aug Sept Oct Nov Dec       X      X   2025 Jan Feb Mar April May June July Aug Sept Oct Nov Dec   X             2026 Jan Feb Mar April May June July Aug Sept Oct Nov Dec                     Asthma Control Test ACT Total Score  09/29/2023 12:12 PM 8  05/24/2022  3:28 PM 14  02/09/2022  9:06 AM 8   Social History: Non-smoker --She has had recent stressors. Her husband has been diagnosed with Parkinson.  Her father passed this winter in 2021. 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HCC)    related to post op, vancomycin  & lumbar injection, steroids     Anxiety    Arthritis    hnp-lumbar    Asthma    Back pain    Blood dyscrasia    hx probable blood clot after ankle surgery  not definitive but treated per pt   Cancer (HCC)    early evolvng melanoma - legs    Depression    Diabetes insipidus (HCC)    Edema of both lower extremities    Family history of adverse reaction to anesthesia    mom has N/V - zofran  works   Gallbladder problem    Heart murmur    years ago-echo 05 no problems   High cholesterol    History of blood transfusion    16 so far (07/25/2012)   History of bronchitis    History of UTI    Hypertension    no med for 1 yr   Malaria by plasmodium vivax 1999   Menopause    Migraines    Multiple food allergies    Peripheral vascular disease (HCC)    ? dvt 2011 tx as preventive although did not show on scan after ankle surgery   Pneumonia ~ 2009; 2010   PONV (postoperative nausea and vomiting)    last 2 surgeries less nausea   PONV (postoperative nausea and vomiting)    none with last surgery-be extra careful with nose patient haas septal hole    Vitamin D  deficiency      Allergies  Allergen Reactions   Amoxicillin Anaphylaxis, Hives and Itching   Black Walnut Pollen Allergy Skin Test Anaphylaxis   Penicillins Anaphylaxis    Has patient had a PCN reaction causing immediate rash, facial/tongue/throat swelling, SOB or lightheadedness with hypotension: Yes Has patient had a PCN reaction causing severe rash involving mucus  membranes or skin necrosis: No Has patient had a PCN reaction that required hospitalization Yes Has patient had a PCN reaction occurring within the last 10 years: No If all of the above answers are  NO, then may proceed with Cephalosporin use.    Sulfa Antibiotics Hives and Other (See Comments)    Hives, mouth blisters    Chloroquine Other (See Comments)    Renal dysfunction   Ciprofloxacin Hives   Phenergan  [Promethazine  Hcl] Other (See Comments)    Caused severe drop in blood pressure.   Sulfamethoxazole Hives   Dupixent  [Dupilumab ] Other (See Comments)    Unilateral facial rash with possible dry skin development. Resolved after d/c   Other Other (See Comments)    From allergy testing--Canteloupe   Shellfish Allergy Itching and Rash   Vancomycin  Cross Reactors Other (See Comments)    RED MAN SYNDROME  Pt stated she had no reaction to Vancomycin  when it is given slow     Outpatient Medications Prior to Visit  Medication Sig Dispense Refill   ADDERALL XR 20 MG 24 hr capsule Take 20 mg by mouth daily as needed (concentration).  0   albuterol  (PROVENTIL ) (2.5 MG/3ML) 0.083% nebulizer solution Take 3 mLs (2.5 mg total) by nebulization every 6 (six) hours as needed for wheezing or shortness of breath. 75 mL 5   aspirin EC 81 MG tablet Take 81 mg by mouth daily. Swallow whole.     benralizumab  (FASENRA  PEN) 30 MG/ML prefilled autoinjector INJECT 1 PEN UNDER THE SKIN EVERY 8 WEEKS 1 mL 0   buPROPion  (WELLBUTRIN  XL) 300 MG 24 hr tablet Take 300 mg by mouth daily.     EPINEPHrine  0.3 mg/0.3 mL IJ SOAJ injection Inject 0.3 mg into the muscle as needed for anaphylaxis. 2 each 2   estradiol  (CLIMARA  - DOSED IN MG/24 HR) 0.0375 mg/24hr patch Place 0.0375 mg onto the skin once a week. saturdays     ibuprofen (ADVIL) 400 MG tablet Take 400 mg by mouth as needed.     omeprazole (PRILOSEC) 20 MG capsule Take 20 mg by mouth daily.     OVER THE COUNTER MEDICATION Take 6 tablets by mouth daily.  Smarty Pants Vitamins     PROAIR  HFA 108 (90 Base) MCG/ACT inhaler Inhale 2 puffs into the lungs every 6 (six) hours as needed for wheezing or shortness of breath. 8 g 5   Probiotic CAPS Take 1 capsule by mouth daily.      Respiratory Therapy Supplies (FLUTTER) DEVI Use flutter valve 3 times a day 1 each 0   rosuvastatin  (CRESTOR ) 20 MG tablet Take 20 mg by mouth daily.     valACYclovir  (VALTREX ) 1000 MG tablet Take two tablets as a single dose prn cold sore. (Patient taking differently: Take 2,000 mg by mouth daily as needed. Take two tablets as a single dose prn cold sore.) 20 tablet 1   albuterol  (VENTOLIN  HFA) 108 (90 Base) MCG/ACT inhaler INHALE TWO PUFFS BY MOUTH EVERY 6 HOURS AS NEEDED FOR WHEEZING OR FOR SHORTNESS OF BREATH 8.5 g 2   fluticasone -salmeterol (ADVAIR ) 250-50 MCG/ACT AEPB Inhale 1 puff into the lungs in the morning and at bedtime. 60 each 5   montelukast  (SINGULAIR ) 10 MG tablet Take 1 tablet (10 mg total) by mouth at bedtime. TAKE ONE TABLET BY MOUTH EVERY NIGHT AT BEDTIME 90 tablet 1   clindamycin  (CLEOCIN ) 150 MG capsule Take by mouth 3 (three) times daily. Unknown dosage     sertraline (ZOLOFT) 50 MG tablet Take 100 mg by mouth daily.     telmisartan (MICARDIS) 40 MG tablet Take 40 mg by mouth daily.     topiramate  (TOPAMAX ) 50 MG tablet Take 150 mg  by mouth daily.     ZITHROMAX  Z-PAK 250 MG tablet Take 500 mg on day 1, followed by 250 mg once daily on days 2 to 5 6 tablet 0   No facility-administered medications prior to visit.    Review of Systems  Constitutional:  Negative for chills, diaphoresis, fever, malaise/fatigue and weight loss.  HENT:  Negative for congestion.   Respiratory:  Positive for cough, sputum production and shortness of breath. Negative for hemoptysis and wheezing.   Cardiovascular:  Negative for chest pain, palpitations and leg swelling.     Objective:   Vitals:   09/29/23 1126  BP: 118/78  Pulse: 88  SpO2: 99%  Weight: 242 lb 4.8 oz  (109.9 kg)  Height: 5' 9 (1.753 m)   SpO2: 99 %  Physical Exam: General: Well-appearing, no acute distress HENT: [place], AT Eyes: EOMI, no scleral icterus Respiratory: Clear to auscultation bilaterally.  No crackles, wheezing or rales Cardiovascular: RRR, -M/R/G, no JVD Extremities:-Edema,-tenderness Neuro: AAO x4, CNII-XII grossly intact Psych: Normal mood, normal affect   Labs Eosinophil 252 (3.4%) 09/25/18  Chest imaging: CXR 09/25/2018-normal chest x-ray with clear lung fields without evidence of infiltrate, effusion or edema CXR 10/16/2020-no infiltrate or effusion or edema. CT abdomen/pelvis 07/14/2021-lower lung fields with no parenchymal abnormalities, effusion or edema.  Diverticulosis, small umbilical hernia. CXR 01/12/22- No acute abnormalities  PFT:  08/07/2018-Mild bronchial hyper reactivity suggestive of asthma    Assessment & Plan:  58 year old female with severe persistent asthma who presents for follow-up for asthma. Uncontrolled asthma off biologics. Previously well controlled on  Nucal from 5/202-08/2021.  Due to insurance she had to pause but despite restart Nucala  in 11/2021, she had breakthrough symptoms and exacerbations. Started on Dupixent  in 06/2022 however symptoms remain uncontrolled and having possible drug-related rashes. Received Fasenra  once on 08/2023 however due lapse in insurance coverage reports difficulty with medical bills so unable to continue.   Insurance reports patient as non-compliant HOWEVER she is only non-adherent due to lapse in insurance coverage, financial difficulties and social hardships with her husbands' chronic illnesses. Patient was previously adherent when insurance provided coverage.   Was steroid dependent while off biologics. Chronic steroids had been weaned off by end of June. No exacerbations during summer while on chronic steroids however symptoms worsened off and recurred. Has had two additional exacerbations in the last six  months requiring outpatient treatment with antibiotics and steroids.  Breztri  - previously tried  Severe Persistent Eosinophilic Asthma, steroid dependent - uncontrolled, recent exacerbation improving --Completing prednisone  taper. Will rx additional taper if symptoms recur --CONTINUE Advair  250-50 mcg ONE puff in the morning and evening. Rinse mouth out after use --CONTINUE Albuterol  as needed for shortness of breath or wheezing --CONTINUE singulair  10 mg nightly --Patient awaiting Fasenra  initiation. Will message pharmacy team to renew Prior Authorization  Chronic steroid use  --Off chronic prednisone  previously for 2 months in summer 2024 --If needed again, prefer to maintain reduced dosage of prednisone  to 5 mg daily  Asthma Action Plan IF you develop asthma symptoms (chest tightness, cough and wheezing), take an extra 2 puffs of Symbicort  or Albuterol  as needed every 4 hours. Call us  for persistent symptoms steroids.  Immunization History  Administered Date(s) Administered   Influenza Split 07/01/2016   Influenza, Quadrivalent, Recombinant, Inj, Pf 05/27/2020, 06/03/2021   Influenza,inj,Quad PF,6+ Mos 07/01/2016, 07/14/2017, 05/27/2019, 05/27/2020   PFIZER Comirnaty(Gray Top)Covid-19 Tri-Sucrose Vaccine 02/06/2021   PFIZER(Purple Top)SARS-COV-2 Vaccination 12/12/2019, 01/06/2020, 07/21/2020, 09/25/2020, 04/14/2021   Pneumococcal  Polysaccharide-23 06/24/2020, 05/17/2022   Td 06/18/2013   Tdap 06/27/2011, 07/14/2014   Zoster Recombinant(Shingrix) 02/23/2021, 04/14/2021   No orders of the defined types were placed in this encounter.  Meds ordered this encounter  Medications   predniSONE  (DELTASONE ) 10 MG tablet    Sig: Take 4 tablets (40 mg total) by mouth daily with breakfast for 2 days, THEN 3 tablets (30 mg total) daily with breakfast for 2 days, THEN 2 tablets (20 mg total) daily with breakfast for 2 days, THEN 1 tablet (10 mg total) daily with breakfast for 2 days.     Dispense:  20 tablet    Refill:  0   fluticasone -salmeterol (ADVAIR ) 250-50 MCG/ACT AEPB    Sig: Inhale 1 puff into the lungs in the morning and at bedtime.    Dispense:  60 each    Refill:  11   albuterol  (VENTOLIN  HFA) 108 (90 Base) MCG/ACT inhaler    Sig: Inhale 2 puffs into the lungs every 6 (six) hours as needed for wheezing or shortness of breath.    Dispense:  8.5 g    Refill:  5   montelukast  (SINGULAIR ) 10 MG tablet    Sig: Take 1 tablet (10 mg total) by mouth at bedtime. TAKE ONE TABLET BY MOUTH EVERY NIGHT AT BEDTIME    Dispense:  90 tablet    Refill:  1    Return in about 3 months (around 12/28/2023).  I have spent a total time of 30-minutes on the day of the appointment including chart review, data review, collecting history, coordinating care and discussing medical diagnosis and plan with the patient/family. Past medical history, allergies, medications were reviewed. Pertinent imaging, labs and tests included in this note have been reviewed and interpreted independently by me.  Shatia Sindoni Slater Staff, MD [facility] 09/29/2023 12:13 PM

## 2023-09-29 NOTE — Patient Instructions (Signed)
  Severe Persistent Eosinophilic Asthma, steroid dependent - uncontrolled, recent exacerbation improving --Completing prednisone  taper. Will rx additional taper if symptoms recur --CONTINUE Advair  250-50 mcg ONE puff in the morning and evening. Rinse mouth out after use --CONTINUE Albuterol  as needed for shortness of breath or wheezing --CONTINUE singulair  10 mg nightly --Patient awaiting Fasenra  initiation. Will message pharmacy team to renew Prior Authorization

## 2023-09-29 NOTE — Telephone Encounter (Signed)
 Submitted an URGENT Prior Authorization request to [facility] for [facility] via CoverMyMeds. Will update once we receive a response.  Key: Ashley Mariner

## 2023-10-02 ENCOUNTER — Other Ambulatory Visit: Payer: Self-pay | Admitting: Pharmacist

## 2023-10-02 ENCOUNTER — Other Ambulatory Visit (HOSPITAL_COMMUNITY): Payer: Self-pay

## 2023-10-02 DIAGNOSIS — J4550 Severe persistent asthma, uncomplicated: Secondary | ICD-10-CM

## 2023-10-02 MED ORDER — FASENRA PEN 30 MG/ML ~~LOC~~ SOAJ: SUBCUTANEOUS | 2 refills | Status: DC

## 2023-10-02 NOTE — Telephone Encounter (Signed)
 Received notification from [facility] regarding a prior authorization for FASENRA . Authorization has been APPROVED from 09/29/2023 to 09/28/2024. Approval letter sent to scan center.  Patient must fill through [facility]: [phone]  Authorization # [id number]  Rx for Fasenra  sent to [facility] today. Called patient to advise - left VM  Sherry Pennant, PharmD, MPH, BCPS, CPP Clinical Pharmacist (Rheumatology and Pulmonology)

## 2023-10-13 ENCOUNTER — Emergency Department (HOSPITAL_COMMUNITY): Payer: 59

## 2023-10-13 ENCOUNTER — Encounter (HOSPITAL_COMMUNITY): Payer: Self-pay

## 2023-10-13 ENCOUNTER — Emergency Department (HOSPITAL_COMMUNITY)
Admission: EM | Admit: 2023-10-13 | Discharge: 2023-10-13 | Discharge status: Against Medical Advice | Payer: 59 | Attending: Emergency Medicine | Admitting: Emergency Medicine

## 2023-10-13 ENCOUNTER — Other Ambulatory Visit: Payer: Self-pay

## 2023-10-13 DIAGNOSIS — R5383 Other fatigue: Secondary | ICD-10-CM | POA: Diagnosis not present

## 2023-10-13 DIAGNOSIS — R5381 Other malaise: Secondary | ICD-10-CM | POA: Insufficient documentation

## 2023-10-13 DIAGNOSIS — R062 Wheezing: Secondary | ICD-10-CM | POA: Insufficient documentation

## 2023-10-13 DIAGNOSIS — R059 Cough, unspecified: Secondary | ICD-10-CM | POA: Insufficient documentation

## 2023-10-13 DIAGNOSIS — Z5321 Procedure and treatment not carried out due to patient leaving prior to being seen by health care provider: Secondary | ICD-10-CM | POA: Diagnosis not present

## 2023-10-13 LAB — COMPREHENSIVE METABOLIC PANEL
ALT: 22 U/L (ref 0–44)
AST: 22 U/L (ref 15–41)
Albumin: 3.6 g/dL (ref 3.5–5.0)
Alkaline Phosphatase: 98 U/L (ref 38–126)
Anion gap: 12 (ref 5–15)
BUN: 7 mg/dL (ref 6–20)
CO2: 25 mmol/L (ref 22–32)
Calcium: 9.1 mg/dL (ref 8.9–10.3)
Chloride: 102 mmol/L (ref 98–111)
Creatinine, Ser: 0.77 mg/dL (ref 0.44–1.00)
GFR, Estimated: >60 mL/min (ref >60)
Glucose, Bld: 112 mg/dL — ABNORMAL HIGH (ref 70–99)
Potassium: 3.8 mmol/L (ref 3.5–5.1)
Sodium: 139 mmol/L (ref 135–145)
Total Bilirubin: 0.5 mg/dL (ref 0.0–1.2)
Total Protein: 6.5 g/dL (ref 6.5–8.1)

## 2023-10-13 LAB — CBC WITH DIFFERENTIAL/PLATELET
Abs Immature Granulocytes: 0.05 10*3/uL (ref 0.00–0.07)
Basophils Absolute: 0 10*3/uL (ref 0.0–0.1)
Basophils Relative: 0 %
Eosinophils Absolute: 0 10*3/uL (ref 0.0–0.5)
Eosinophils Relative: 0 %
HCT: 43.1 % (ref 36.0–46.0)
Hemoglobin: 14 g/dL (ref 12.0–15.0)
Immature Granulocytes: 0 %
Lymphocytes Relative: 9 %
Lymphs Abs: 1 10*3/uL (ref 0.7–4.0)
MCH: 30.3 pg (ref 26.0–34.0)
MCHC: 32.5 g/dL (ref 30.0–36.0)
MCV: 93.3 fL (ref 80.0–100.0)
Monocytes Absolute: 0.7 10*3/uL (ref 0.1–1.0)
Monocytes Relative: 6 %
Neutro Abs: 10.4 10*3/uL — ABNORMAL HIGH (ref 1.7–7.7)
Neutrophils Relative %: 85 %
Platelets: 263 10*3/uL (ref 150–400)
RBC: 4.62 MIL/uL (ref 3.87–5.11)
RDW: 14 % (ref 11.5–15.5)
WBC: 12.2 10*3/uL — ABNORMAL HIGH (ref 4.0–10.5)
nRBC: 0 % (ref 0.0–0.2)

## 2023-10-13 MED ORDER — PREDNISONE 20 MG PO TABS
60.0000 mg | ORAL_TABLET | Freq: Once | ORAL | Status: AC
  Administered 2023-10-13: 60 mg via ORAL
  Filled 2023-10-13: qty 3

## 2023-10-13 NOTE — ED Provider Triage Note (Signed)
Emergency Medicine Provider Triage Evaluation Note  [name] [name] , a 58 y.o. female  was evaluated in triage.  Pt complains of malaise, cough, fatigue, wheezing.  Review of Systems  Positive: Cough, fatigue, wheezing Negative: Chest pain, abdominal pain  Physical Exam  BP (!) 156/90 (BP Location: Right Arm)   Pulse 97   Temp 98.5 F (36.9 C) (Oral)   Resp 20   Ht 5\' 9"  (1.753 m)   Wt 97.5 kg   SpO2 98%   BMI 31.75 kg/m  Gen:   Awake, no distress   Resp:  Normal effort  MSK:   Moves extremities without difficulty  Other:    Medical Decision Making  Medically screening exam initiated at 12:08 PM.  Appropriate orders placed.  [name] [name] was informed that the remainder of the evaluation will be completed by another provider, this initial triage assessment does not replace that evaluation, and the importance of remaining in the ED until their evaluation is complete.  Patient is presenting from urgent care for evaluation of likely asthma exacerbation.  Patient is not currently taking prednisone.  Patient with reportedly negative COVID, flu, strep rapid test at urgent care today.  D-dimer was sent by urgent care but has not yet resulted.  Patient with history of DVT but no prior documented history of PE.  Patient is not currently anticoagulated.  Patient denies significant respiratory symptoms at present.  She feels much improved after breathing treatment during transport.  She is requesting a dose of prednisone now.  She has a longstanding history of severe persistent asthma requiring steroid.  Suspect viral syndrome with likely concurrent asthma exacerbation.  She understands plan of ED care.   [name] Fines, MD 10/13/23 1213

## 2023-10-13 NOTE — ED Notes (Signed)
Patient left AMA.

## 2023-10-13 NOTE — ED Triage Notes (Addendum)
Pt to ED via [facility] from [facility]. Pt was being seen for asthma exacerbation. Pt also had a near syncopal episode. Pt has had a duoneb PTA. 18g LAC by [facility]. Per EMS, provider was concerned for a PE and sent to ED for eval. Pt has also had fatigue, headache, diarrhea, and fever for past few days. Pt states her chest feels tight.

## 2023-10-14 ENCOUNTER — Telehealth: Payer: Self-pay | Admitting: Pulmonary Disease

## 2023-10-14 ENCOUNTER — Encounter (HOSPITAL_COMMUNITY): Payer: Self-pay

## 2023-10-14 ENCOUNTER — Emergency Department (HOSPITAL_COMMUNITY)
Admission: EM | Admit: 2023-10-14 | Discharge: 2023-10-14 | Disposition: A | Discharge status: Home / Self Care | Payer: 59 | Attending: Emergency Medicine | Admitting: Emergency Medicine

## 2023-10-14 ENCOUNTER — Emergency Department (HOSPITAL_COMMUNITY): Payer: 59

## 2023-10-14 ENCOUNTER — Other Ambulatory Visit: Payer: Self-pay

## 2023-10-14 DIAGNOSIS — J45901 Unspecified asthma with (acute) exacerbation: Secondary | ICD-10-CM | POA: Insufficient documentation

## 2023-10-14 DIAGNOSIS — Z7982 Long term (current) use of aspirin: Secondary | ICD-10-CM | POA: Diagnosis not present

## 2023-10-14 DIAGNOSIS — Z20822 Contact with and (suspected) exposure to covid-19: Secondary | ICD-10-CM | POA: Insufficient documentation

## 2023-10-14 DIAGNOSIS — R0789 Other chest pain: Secondary | ICD-10-CM | POA: Diagnosis present

## 2023-10-14 DIAGNOSIS — H6502 Acute serous otitis media, left ear: Secondary | ICD-10-CM | POA: Diagnosis not present

## 2023-10-14 MED ORDER — BENZONATATE 100 MG PO CAPS: 100.0000 mg | ORAL_CAPSULE | Freq: Three times a day (TID) | ORAL | 0 refills | Status: AC

## 2023-10-14 MED ORDER — IOHEXOL 350 MG/ML SOLN
75.0000 mL | Freq: Once | INTRAVENOUS | Status: AC | PRN
  Administered 2023-10-14: 75 mL via INTRAVENOUS

## 2023-10-14 MED ORDER — METHYLPREDNISOLONE SODIUM SUCC 125 MG IJ SOLR
125.0000 mg | Freq: Once | INTRAMUSCULAR | Status: AC
  Administered 2023-10-14: 125 mg via INTRAVENOUS
  Filled 2023-10-14: qty 2

## 2023-10-14 MED ORDER — IPRATROPIUM-ALBUTEROL 0.5-2.5 (3) MG/3ML IN SOLN: 3.0000 mL | Freq: Four times a day (QID) | RESPIRATORY_TRACT | 2 refills | Status: AC | PRN

## 2023-10-14 MED ORDER — AZITHROMYCIN 250 MG PO TABS: 250.0000 mg | ORAL_TABLET | Freq: Every day | ORAL | 0 refills | Status: AC

## 2023-10-14 MED ORDER — PREDNISONE 10 MG (21) PO TBPK: ORAL_TABLET | Freq: Every day | ORAL | 0 refills | Status: DC

## 2023-10-14 MED ORDER — AZITHROMYCIN 250 MG PO TABS
500.0000 mg | ORAL_TABLET | Freq: Once | ORAL | Status: AC
  Administered 2023-10-14: 500 mg via ORAL
  Filled 2023-10-14: qty 2

## 2023-10-14 NOTE — ED Provider Notes (Signed)
[place] EMERGENCY DEPARTMENT AT [facility] Provider Note   CSN: [id number] Arrival date & time: 10/14/23  1811     History  Chief Complaint  Patient presents with   Abnormal Lab    [name] [name] is a 58 y.o. female past medical history significant for severe persistent asthma presents today for abnormal lab results.  Patient was seen yesterday at an urgent care for chest congestion where they did a D-dimer that came back elevated which prompted her to come here today.  Patient also initially checked into [facility] yesterday for evaluation for malaise, cough, fatigue, and wheezing.   Abnormal Lab      Home Medications Prior to Admission medications   Medication Sig Start Date End Date Taking? Authorizing Provider  azithromycin (ZITHROMAX) 250 MG tablet Take 1 tablet (250 mg total) by mouth daily for 4 days. Take 1 tablet every day until finished. 10/14/23 10/18/23 Yes Dolphus Jenny, PA-C  benzonatate (TESSALON) 100 MG capsule Take 1 capsule (100 mg total) by mouth 3 (three) times daily for 10 days. 10/14/23 10/24/23 Yes Dolphus Jenny, PA-C  ipratropium-albuterol (DUONEB) 0.5-2.5 (3) MG/3ML SOLN Take 3 mLs by nebulization every 6 (six) hours as needed. 10/14/23  Yes Dolphus Jenny, PA-C  predniSONE (STERAPRED UNI-PAK 21 TAB) 10 MG (21) TBPK tablet Take by mouth daily. Take 4 tabs by mouth daily for 3 days, then 3 tabs for 3 days, then 2 tabs for 3 days, then 1 tabs for 3 days. 10/14/23  Yes Dolphus Jenny, PA-C  ADDERALL XR 20 MG 24 hr capsule Take 20 mg by mouth daily as needed (concentration). 08/20/16   [provider]  aspirin EC 81 MG tablet Take 81 mg by mouth daily. Swallow whole.    [provider]  benralizumab [facility] PEN) 30 MG/ML prefilled autoinjector INJECT 1 PEN UNDER THE SKIN EVERY 8 WEEKS 10/02/23   Luciano Cutter, MD  buPROPion (WELLBUTRIN XL) 300 MG 24 hr tablet Take 300 mg by mouth daily. 09/03/22   [provider]   EPINEPHrine 0.3 mg/0.3 mL IJ SOAJ injection Inject 0.3 mg into the muscle as needed for anaphylaxis. 07/18/22   Luciano Cutter, MD  estradiol (CLIMARA - DOSED IN MG/24 HR) 0.0375 mg/24hr patch Place 0.0375 mg onto the skin once a week. saturdays    [provider]  fluticasone-salmeterol (ADVAIR) 250-50 MCG/ACT AEPB Inhale 1 puff into the lungs in the morning and at bedtime. 09/29/23   Luciano Cutter, MD  ibuprofen (ADVIL) 400 MG tablet Take 400 mg by mouth as needed.    [provider]  montelukast (SINGULAIR) 10 MG tablet Take 1 tablet (10 mg total) by mouth at bedtime. TAKE ONE TABLET BY MOUTH EVERY NIGHT AT BEDTIME 09/29/23   Luciano Cutter, MD  omeprazole (PRILOSEC) 20 MG capsule Take 20 mg by mouth daily.    [provider]  OVER THE COUNTER MEDICATION Take 6 tablets by mouth daily. Smarty Pants Vitamins    [provider]  Probiotic CAPS Take 1 capsule by mouth daily.     [provider]  Respiratory Therapy Supplies (FLUTTER) DEVI Use flutter valve 3 times a day 09/25/18   Glenford Bayley, NP  rosuvastatin (CRESTOR) 20 MG tablet Take 20 mg by mouth daily.    [provider]  valACYclovir (VALTREX) 1000 MG tablet Take two tablets as a single dose prn cold sore. Patient taking differently: Take 2,000 mg by mouth  daily as needed. Take two tablets as a single dose prn cold sore. 09/08/16   Kerrin Champagne, MD      Allergies    Amoxicillin, Black walnut pollen allergy skin test, Penicillins, Sulfa antibiotics, Chloroquine, Ciprofloxacin, Phenergan [promethazine hcl], Sulfamethoxazole, Dupixent [dupilumab], Other, Shellfish allergy, and Vancomycin cross reactors    Review of Systems   Review of Systems  Physical Exam Updated Vital Signs BP (!) 149/84 (BP Location: Left Arm)   Pulse 99   Temp 98.2 F (36.8 C) (Oral)   Resp 18   SpO2 97%  Physical Exam Vitals and nursing note reviewed.  Constitutional:      General: She is  not in acute distress.    Appearance: She is well-developed.  HENT:     Head: Normocephalic and atraumatic.     Right Ear: Tympanic membrane normal.     Left Ear: A middle ear effusion is present.  Eyes:     Conjunctiva/sclera: Conjunctivae normal.  Cardiovascular:     Rate and Rhythm: Normal rate and regular rhythm.     Heart sounds: No murmur heard. Pulmonary:     Effort: Pulmonary effort is normal. No respiratory distress.     Breath sounds: Wheezing present.  Abdominal:     General: Bowel sounds are normal.     Palpations: Abdomen is soft.     Tenderness: There is no abdominal tenderness.  Musculoskeletal:        General: No swelling.     Cervical back: Neck supple.  Skin:    General: Skin is warm and dry.     Capillary Refill: Capillary refill takes less than 2 seconds.  Neurological:     General: No focal deficit present.     Mental Status: She is alert.     Motor: No weakness.  Psychiatric:        Mood and Affect: Mood normal.     ED Results / Procedures / Treatments   Labs (all labs ordered are listed, but only abnormal results are displayed) Labs Reviewed  RESPIRATORY PANEL BY PCR    EKG None  Radiology CT Angio Chest PE W and/or Wo Contrast Result Date: 10/14/2023 CLINICAL DATA:  Pulmonary embolism (PE) suspected, low to intermediate prob, positive D-dimer RO PE - LABS Available from yesterday EXAM: CT ANGIOGRAPHY CHEST WITH CONTRAST TECHNIQUE: Multidetector CT imaging of the chest was performed using the standard protocol during bolus administration of intravenous contrast. Multiplanar CT image reconstructions and MIPs were obtained to evaluate the vascular anatomy. RADIATION DOSE REDUCTION: This exam was performed according to the departmental dose-optimization program which includes automated exposure control, adjustment of the mA and/or kV according to patient size and/or use of iterative reconstruction technique. CONTRAST:  75mL OMNIPAQUE IOHEXOL 350 MG/ML  SOLN COMPARISON:  10/13/2023 chest x-ray FINDINGS: Cardiovascular: Satisfactory opacification of the pulmonary arteries to the segmental level. No evidence of pulmonary embolism. Thoracic aorta is normal in course and caliber. Normal heart size. No pericardial effusion. Mediastinum/Nodes: No enlarged mediastinal, hilar, or axillary lymph nodes. Thyroid gland, trachea, and esophagus demonstrate no significant findings. Tiny hiatal hernia. Lungs/Pleura: Mosaic attenuation of the lung fields. No focal airspace consolidation. Mild bronchial wall thickening. No pleural effusion or pneumothorax. Upper Abdomen: No acute abnormality. Musculoskeletal: No chest wall abnormality. No acute or significant osseous findings. Review of the MIP images confirms the above findings. IMPRESSION: 1. No evidence of pulmonary embolism. 2. Mosaic attenuation of the lung fields with mild bronchial wall thickening, suggestive of small  airways disease. 3. Tiny hiatal hernia. Electronically Signed   By: Duanne Guess D.O.   On: 10/14/2023 19:43   DG Chest 2 View Result Date: 10/13/2023 CLINICAL DATA:  Cough.  Shortness of breath. EXAM: CHEST - 2 VIEW COMPARISON:  Chest radiograph dated January 12, 2022. FINDINGS: The heart size and mediastinal contours are within normal limits. No focal consolidation, sizeable pleural effusion, or pneumothorax. No acute osseous abnormality. IMPRESSION: No acute cardiopulmonary findings. Electronically Signed   By: Hart Robinsons M.D.   On: 10/13/2023 14:30    Procedures Procedures    Medications Ordered in ED Medications  azithromycin (ZITHROMAX) tablet 500 mg (has no administration in time range)  iohexol (OMNIPAQUE) 350 MG/ML injection 75 mL (75 mLs Intravenous Contrast Given 10/14/23 1931)  methylPREDNISolone sodium succinate (SOLU-MEDROL) 125 mg/2 mL injection 125 mg (125 mg Intravenous Given 10/14/23 2105)    ED Course/ Medical Decision Making/ A&P                                  Medical Decision Making Risk Prescription drug management.   This patient presents to the ED with chief complaint(s) of abnormal lab finding with pertinent past medical history of severe asthma which further complicates the presenting complaint. The complaint involves an extensive differential diagnosis and also carries with it a high risk of complications and morbidity.    The differential diagnosis includes asthma exacerbation, COVID, flu, RSV, PE  Additional history obtained: Additional history obtained from spouse Records reviewed Care Everywhere/External Records  ED Course and Reassessment:  Labs: Respiratory panel PCR: Pending  Independent visualization of imaging: - I independently visualized the following imaging with scope of interpretation limited to determining acute life threatening conditions related to emergency care: CTA PE, which revealed no evidence of pulmonary embolism, mosaic attenuation of the lung fields with mild bronchial wall thickening, suggestive of small airway disease  Consultation: - Consulted or discussed management/test interpretation w/ external professional: None  Consideration for admission or further workup: Considered for admission or further workup however patient's vital signs, physical exam, labs, and imaging have been reassuring.  Patient's symptoms likely due to asthma exacerbation with acute otitis media of the left ear.  Patient will be given course of antibiotics for her ear infection.  Patient placed on prednisone taper per pulmonology recommendation.  Patient given Jerilynn Som for cough and DuoNeb prescription as needed.  Patient should follow-up with primary care physician if her symptoms persist for further evaluation and treatment.  Patient is still pending her respiratory panel and will be notified of these results.        Final Clinical Impression(s) / ED Diagnoses Final diagnoses:  Non-recurrent acute serous otitis media of  left ear  Moderate asthma with exacerbation, unspecified whether persistent    Rx / DC Orders ED Discharge Orders          Ordered    predniSONE (STERAPRED UNI-PAK 21 TAB) 10 MG (21) TBPK tablet  Daily        10/14/23 1949    ipratropium-albuterol (DUONEB) 0.5-2.5 (3) MG/3ML SOLN  Every 6 hours PRN        10/14/23 1949    azithromycin (ZITHROMAX) 250 MG tablet  Daily        10/14/23 2152    benzonatate (TESSALON) 100 MG capsule  3 times daily        10/14/23 2206  Dolphus Jenny, PA-C 10/14/23 2210    Vanetta Mulders, MD 10/18/23 [phone]

## 2023-10-14 NOTE — Telephone Encounter (Signed)
Weekend Call Note  Patient called in with not feeling well over the past week. Covid and flu testing has been negative. Went to [employer] care yesterday, noted to have high blood pressure, low SpO2 for her and episode of pre-syncope vs syncope. She went to [facility] ER and waited for hours but then left. She reports an elevated D-dimer lab from her urgent care visit.   I recommended that she come to the ER to have a CTA PE study done to rule out pulmonary emboli given her d-dimer results and history of being on estradiol patch.  Melody Comas, MD [facility] Office: [phone]   See Amion for personal pager PCCM on call pager [phone] until 7pm. Please call Elink 7p-7a. [phone]

## 2023-10-14 NOTE — Discharge Instructions (Signed)
Today were seen for acute otitis media and an asthma exacerbation.  Please pick up your medication and take as prescribed.  You have 1 or more labs pending and will be notified of these results in the upcoming days.  Thank you for letting us treat you today. After performing a physical exam and reviewing your imaging, I feel you are safe to go home. Please follow up with your PCP in the next several days and provide them with your records from this visit. Return to the Emergency Room if pain becomes severe or symptoms worsen.

## 2023-10-14 NOTE — ED Triage Notes (Signed)
Pt was sent in by her doctor for elevated d dimer. Pt has a cough and shob but has been using her inhaler for relief.

## 2023-10-15 LAB — RESPIRATORY PANEL BY PCR

## 2023-10-20 ENCOUNTER — Encounter (HOSPITAL_BASED_OUTPATIENT_CLINIC_OR_DEPARTMENT_OTHER): Payer: Self-pay | Admitting: Pulmonary Disease

## 2023-10-20 MED ORDER — HYDROCODONE BIT-HOMATROP MBR 5-1.5 MG/5ML PO SOLN: 5.0000 mL | Freq: Four times a day (QID) | ORAL | 0 refills | Status: AC | PRN

## 2023-10-20 MED ORDER — PREDNISONE 10 MG PO TABS: ORAL_TABLET | ORAL | 0 refills | Status: DC

## 2023-10-20 NOTE — Telephone Encounter (Signed)
Seen in ED 1/17-fatigue, cough, wheezing- sent home with prednisone. Covid, flu rsv at urgent care were negative. ED 1/18 due to elevated D Dimer. Dishcarged with prednisone, zpak, duoneb and tesalon. Respiratory culture is negative.  Please advise.

## 2023-10-24 ENCOUNTER — Encounter (HOSPITAL_BASED_OUTPATIENT_CLINIC_OR_DEPARTMENT_OTHER): Payer: Self-pay | Admitting: Pulmonary Disease

## 2023-10-24 ENCOUNTER — Telehealth (HOSPITAL_BASED_OUTPATIENT_CLINIC_OR_DEPARTMENT_OTHER): Payer: Self-pay | Admitting: Pulmonary Disease

## 2023-10-24 ENCOUNTER — Other Ambulatory Visit (HOSPITAL_COMMUNITY): Payer: Self-pay

## 2023-10-24 DIAGNOSIS — J019 Acute sinusitis, unspecified: Secondary | ICD-10-CM | POA: Diagnosis not present

## 2023-10-24 DIAGNOSIS — J4550 Severe persistent asthma, uncomplicated: Secondary | ICD-10-CM

## 2023-10-24 MED ORDER — LEVOFLOXACIN 500 MG PO TABS: 500.0000 mg | ORAL_TABLET | Freq: Every day | ORAL | 0 refills | Status: AC

## 2023-10-24 MED ORDER — PREDNISONE 10 MG PO TABS: ORAL_TABLET | ORAL | 0 refills | Status: AC

## 2023-10-24 NOTE — Progress Notes (Signed)
Virtual Visit via Video Note  I connected with [name] [name] on 10/24/23 at 10:30 AM EST by a video enabled telemedicine application and verified that I am speaking with the correct person using two identifiers.  Location: Patient: Home Provider: [facility]   I discussed the limitations of evaluation and management by telemedicine and the availability of in person appointments. The patient expressed understanding and agreed to proceed.   I discussed the assessment and treatment plan with the patient. The patient was provided an opportunity to ask questions and all were answered. The patient agreed with the plan and demonstrated an understanding of the instructions.   The patient was advised to call back or seek an in-person evaluation if the symptoms worsen or if the condition fails to improve as anticipated.  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 Started on [place] on 03/16/2019.  Overall well-controlled since initiation of biologic.  She previously had 3 exacerbations a year.  While on Nucala only occurred at most once a year. After missing doses since the fall 2022 she has had worsening symptoms and restarted injections 11/2021 - 06/2022 but unfortunately did not have same level of asthma control. Transitioned to Dupixent in 06/2022 however symptoms remained uncontrolled and experienced  facial flushing and rashes around the mouth and abdominal with use. Harrington Challenger was started 09/20/22. However has not been consistently on biologics in 2024 due to lapse  in insurance coverage   09/29/23 Since our last visit she has been seen by urgent care at [facility] and treated for zpack and prednisone for asthma. exacerbation. Still having low grade fever. Improved but she reports currently productive cough. Wheezing has resolved. Has constant drainage. Some shortness of breath. Requires albuterol frequently in closed spaces.  10/24/23 Since our last visit patient has been seen in the ED on 1/17 and 1/18. ED course reviewed including CTA neg for PE. She received steroid shot and azithromycin, duoneb and tessalon perles.She contacted office for persistent symptoms and prednisone taper and narcotic cough syrup was sent in. Has completed antibiotics. Harrington Challenger has been approved and pharmacy team contacted patient on voicemail on 10/02/23. She administered it on 10/20/23 on left lower abdomen  and developed non-itchy but raised welt for 24 hours that has resolved without treatment. She reports overall she is about 50% better however in the afternoons she has cough and fatigue and nasal congestion. Using saline rinses, unable to nasal sprays due to chronic sinus issues.   2019 Jan Feb Mar April May June July Aug Sept Oct Nov Dec            X X X  2020 Jan Feb Mar April May June July Aug Sept Oct Nov Dec   X    X       X   2021 Jan Feb Mar April May June July Aug Sept Oct Nov Dec                2022 Jan Feb Mar April May June July Aug Sept Oct Nov Dec   X  X   2023 Jan Feb Mar April May June July Aug Sept Oct Nov Dec     XX XX    [name] [name]   2024 Jan Feb Mar April May June July Aug Sept Oct Nov Dec       X      X   2025 Jan Feb Mar April May June July Aug Sept Oct Nov Dec   X             2026 Jan Feb Mar April May June July Aug Sept Oct Nov Dec                  Asthma Control Test ACT Total Score  09/29/2023 12:12 PM 8  05/24/2022  3:28 PM 14  02/09/2022  9:06 AM 8   Social History: Non-smoker --She has had recent stressors. Her husband has been diagnosed  with Parkinson.  Her father passed this winter in 2021. 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HCC)    related to post op, vancomycin & lumbar injection, steroids     Anxiety    Arthritis    hnp-lumbar    Asthma    Back pain    Blood dyscrasia    hx probable blood clot after ankle surgery  not definitive but treated per pt   Cancer (HCC)    "early evolvng melanoma "- legs    Depression    Diabetes insipidus (HCC)    Edema of both lower extremities    Family history of adverse reaction to anesthesia    mom has N/V - zofran works   Gallbladder problem    Heart murmur    years ago-echo 05 no problems   High cholesterol    History of blood transfusion    "16 so far" (07/25/2012)   History of bronchitis    History of UTI    Hypertension    no med for 1 yr   Malaria by plasmodium vivax 1999   Menopause    Migraines    Multiple food allergies    Peripheral vascular disease (HCC)    ? dvt 2011 tx as preventive although did not show on scan after ankle surgery   Pneumonia ~ 2009; 2010   PONV (postoperative nausea and vomiting)    last 2 surgeries less nausea   PONV (postoperative nausea and vomiting)    none with last surgery-be extra careful with nose patient haas septal hole    Vitamin D deficiency      Allergies  Allergen Reactions   Amoxicillin Anaphylaxis, Hives and Itching   Black [place] Pollen Allergy Skin Test Anaphylaxis   Penicillins Anaphylaxis    Has patient had a PCN reaction causing immediate rash, facial/tongue/throat swelling, SOB or lightheadedness with hypotension: Yes Has patient had a PCN reaction causing severe rash involving mucus membranes or skin necrosis: No Has patient had a PCN reaction that required hospitalization Yes Has patient had a PCN reaction occurring within the last 10 years: No If all of the above answers are "NO", then may  proceed with Cephalosporin use.    Sulfa Antibiotics Hives and Other (See Comments)    Hives, mouth blisters    Chloroquine Other (See Comments)    Renal dysfunction   Ciprofloxacin Hives   Phenergan [Promethazine Hcl] Other (See Comments)    Caused "severe drop  in blood pressure."   Sulfamethoxazole Hives   Dupixent [Dupilumab] Other (See Comments)    Unilateral facial rash with possible dry skin development. Resolved after d/c   Other Other (See Comments)    From allergy testing--Canteloupe   Shellfish Allergy Itching and Rash   Vancomycin Cross Reactors Other (See Comments)    "RED MAN SYNDROME"  Pt stated she had no reaction to Vancomycin when it is given slow     Outpatient Medications Prior to Visit  Medication Sig Dispense Refill   ADDERALL XR 20 MG 24 hr capsule Take 20 mg by mouth daily as needed (concentration).  0   aspirin EC 81 MG tablet Take 81 mg by mouth daily. Swallow whole.     benralizumab (FASENRA PEN) 30 MG/ML prefilled autoinjector INJECT 1 PEN UNDER THE SKIN EVERY 8 WEEKS 1 mL 2   benzonatate (TESSALON) 100 MG capsule Take 1 capsule (100 mg total) by mouth 3 (three) times daily for 10 days. 30 capsule 0   buPROPion (WELLBUTRIN XL) 300 MG 24 hr tablet Take 300 mg by mouth daily.     EPINEPHrine 0.3 mg/0.3 mL IJ SOAJ injection Inject 0.3 mg into the muscle as needed for anaphylaxis. 2 each 2   estradiol (CLIMARA - DOSED IN MG/24 HR) 0.0375 mg/24hr patch Place 0.0375 mg onto the skin once a week. saturdays     fluticasone-salmeterol (ADVAIR) 250-50 MCG/ACT AEPB Inhale 1 puff into the lungs in the morning and at bedtime. 60 each 11   HYDROcodone bit-homatropine (HYDROMET) 5-1.5 MG/5ML syrup Take 5 mLs by mouth every 6 (six) hours as needed for cough. 120 mL 0   ibuprofen (ADVIL) 400 MG tablet Take 400 mg by mouth as needed.     ipratropium-albuterol (DUONEB) 0.5-2.5 (3) MG/3ML SOLN Take 3 mLs by nebulization every 6 (six) hours as needed. 360 mL 2   montelukast  (SINGULAIR) 10 MG tablet Take 1 tablet (10 mg total) by mouth at bedtime. TAKE ONE TABLET BY MOUTH EVERY NIGHT AT BEDTIME 90 tablet 1   omeprazole (PRILOSEC) 20 MG capsule Take 20 mg by mouth daily.     OVER THE COUNTER MEDICATION Take 6 tablets by mouth daily. Smarty Pants Vitamins     Probiotic CAPS Take 1 capsule by mouth daily.      Respiratory Therapy Supplies (FLUTTER) DEVI Use flutter valve 3 times a day 1 each 0   rosuvastatin (CRESTOR) 20 MG tablet Take 20 mg by mouth daily.     valACYclovir (VALTREX) 1000 MG tablet Take two tablets as a single dose prn cold sore. (Patient taking differently: Take 2,000 mg by mouth daily as needed. Take two tablets as a single dose prn cold sore.) 20 tablet 1   predniSONE (DELTASONE) 10 MG tablet Take 4 tablets (40 mg total) by mouth daily with breakfast for 2 days, THEN 3 tablets (30 mg total) daily with breakfast for 2 days, THEN 2 tablets (20 mg total) daily with breakfast for 2 days, THEN 1 tablet (10 mg total) daily with breakfast for 2 days. 20 tablet 0   predniSONE (STERAPRED UNI-PAK 21 TAB) 10 MG (21) TBPK tablet Take by mouth daily. Take 4 tabs by mouth daily for 3 days, then 3 tabs for 3 days, then 2 tabs for 3 days, then 1 tabs for 3 days. 30 tablet 0   No facility-administered medications prior to visit.    Review of Systems  Constitutional:  Negative for chills, diaphoresis, fever, malaise/fatigue and weight loss.  HENT:  Positive for congestion and sinus pain.   Respiratory:  Positive for cough. Negative for hemoptysis, sputum production, shortness of breath and wheezing.   Cardiovascular:  Negative for chest pain, palpitations and leg swelling.     Objective:   There were no vitals filed for this visit.    Physical Exam: General: Well-appearing, no acute distress HENT: [place], AT Eyes: EOMI, no scleral icterus Respiratory: No respiratory distress Neuro: AAO x4, CNII-XII grossly intact Psych: Normal mood, normal  affect  Labs Eosinophil 252 (3.4%) 09/25/18  Chest imaging: CXR 09/25/2018-normal chest x-ray with clear lung fields without evidence of infiltrate, effusion or edema CXR 10/16/2020-no infiltrate or effusion or edema. CT abdomen/pelvis 07/14/2021-lower lung fields with no parenchymal abnormalities, effusion or edema.  Diverticulosis, small umbilical hernia. CXR 01/12/22- No acute abnormalities CTA 10/14/23 - No evidence of pulmonary embolism. Mosaic attenuation  PFT:  08/07/2018-Mild bronchial hyper reactivity suggestive of asthma    Assessment & Plan:  58 year old female with severe persistent asthma who presents for follow-up for asthma. Uncontrolled asthma off biologics. On Harrington Challenger with some improvement but recent exacerbation on 09/2023 and still recovering. Persistent cough likely exacerbated by sinus infection.   Breztri - previously tried  Acute bacterial sinusitis Completed zpack without relief Allergy to penicillin. Will prescribe levaquin 500 mg daily x 7 days Prednisone taper ordered however advised to try antibiotics first   Severe Persistent Eosinophilic Asthma, steroid dependent - uncontrolled, recent exacerbation improving --Completing prednisone taper. Will rx additional taper if symptoms recur --CONTINUE Advair 250-50 mcg ONE puff in the morning and evening. Rinse mouth out after use --CONTINUE Albuterol as needed for shortness of breath or wheezing --CONTINUE singulair 10 mg nightly --CONTINUE Fasenra as scheduled  >>Local injection site irritation. Prior to next injection, advised to take benadryl 25 mg 30 min prior. Also ok to use cortisone cream and icing for 15 min if redness develops. If recurs, will consider on-site injection to visualize patient's technique during injection.  Chronic steroid use  --Off chronic prednisone, previously for 2 months in summer 2024 --If needed again, prefer to maintain reduced dosage of prednisone to 5 mg daily  Asthma Action  Plan IF you develop asthma symptoms (chest tightness, cough and wheezing), take an extra 2 puffs of Symbicort or Albuterol as needed every 4 hours. Call us for persistent symptoms steroids.  Immunization History  Administered Date(s) Administered   Influenza Split 07/01/2016   Influenza, Quadrivalent, Recombinant, Inj, Pf 05/27/2020, 06/03/2021   Influenza,inj,Quad PF,6+ Mos 07/01/2016, 07/14/2017, 05/27/2019, 05/27/2020   PFIZER Comirnaty(Gray Top)Covid-19 Tri-Sucrose Vaccine 02/06/2021   PFIZER(Purple Top)SARS-COV-2 Vaccination 12/12/2019, 01/06/2020, 07/21/2020, 09/25/2020, 04/14/2021   Pneumococcal Polysaccharide-23 06/24/2020, 05/17/2022   Td 06/18/2013   Tdap 06/27/2011, 07/14/2014   Zoster Recombinant(Shingrix) 02/23/2021, 04/14/2021   No orders of the defined types were placed in this encounter.  Meds ordered this encounter  Medications   predniSONE (DELTASONE) 10 MG tablet    Sig: Take 4 tablets (40 mg total) by mouth daily with breakfast for 2 days, THEN 3 tablets (30 mg total) daily with breakfast for 2 days, THEN 2 tablets (20 mg total) daily with breakfast for 2 days, THEN 1 tablet (10 mg total) daily with breakfast for 2 days.    Dispense:  20 tablet    Refill:  0   levofloxacin (LEVAQUIN) 500 MG tablet    Sig: Take 1 tablet (500 mg total) by mouth daily.    Dispense:  7 tablet    Refill:  0    No follow-ups on file. Scheduled for April 2025  I have spent a total time of 36-minutes on the day of the appointment including chart review, data review, collecting history, coordinating care and discussing medical diagnosis and plan with the patient/family. Past medical history, allergies, medications were reviewed. Pertinent imaging, labs and tests included in this note have been reviewed and interpreted independently by me.  [name] Savarese Mechele Collin, MD [facility] 10/24/2023 12:08 PM

## 2023-11-13 ENCOUNTER — Other Ambulatory Visit: Payer: Self-pay | Admitting: Pulmonary Disease

## 2023-11-13 DIAGNOSIS — J4550 Severe persistent asthma, uncomplicated: Secondary | ICD-10-CM

## 2023-12-05 DIAGNOSIS — M5412 Radiculopathy, cervical region: Secondary | ICD-10-CM | POA: Diagnosis not present

## 2023-12-15 DIAGNOSIS — M5412 Radiculopathy, cervical region: Secondary | ICD-10-CM | POA: Diagnosis not present

## 2024-01-01 DIAGNOSIS — F330 Major depressive disorder, recurrent, mild: Secondary | ICD-10-CM | POA: Diagnosis not present

## 2024-01-03 ENCOUNTER — Ambulatory Visit (HOSPITAL_BASED_OUTPATIENT_CLINIC_OR_DEPARTMENT_OTHER): Payer: 59 | Admitting: Pulmonary Disease

## 2024-01-19 DIAGNOSIS — M7989 Other specified soft tissue disorders: Secondary | ICD-10-CM | POA: Diagnosis not present

## 2024-01-19 DIAGNOSIS — S01312A Laceration without foreign body of left ear, initial encounter: Secondary | ICD-10-CM | POA: Diagnosis not present

## 2024-01-19 DIAGNOSIS — M79662 Pain in left lower leg: Secondary | ICD-10-CM | POA: Diagnosis not present

## 2024-01-19 DIAGNOSIS — W230XXA Caught, crushed, jammed, or pinched between moving objects, initial encounter: Secondary | ICD-10-CM | POA: Diagnosis not present

## 2024-01-19 DIAGNOSIS — Z23 Encounter for immunization: Secondary | ICD-10-CM | POA: Diagnosis not present

## 2024-01-22 ENCOUNTER — Encounter (HOSPITAL_BASED_OUTPATIENT_CLINIC_OR_DEPARTMENT_OTHER): Payer: Self-pay

## 2024-01-24 ENCOUNTER — Ambulatory Visit (HOSPITAL_BASED_OUTPATIENT_CLINIC_OR_DEPARTMENT_OTHER): Admitting: Pulmonary Disease

## 2024-02-05 DIAGNOSIS — Z4802 Encounter for removal of sutures: Secondary | ICD-10-CM | POA: Diagnosis not present

## 2024-02-15 DIAGNOSIS — F3341 Major depressive disorder, recurrent, in partial remission: Secondary | ICD-10-CM | POA: Diagnosis not present

## 2024-02-29 DIAGNOSIS — R609 Edema, unspecified: Secondary | ICD-10-CM | POA: Diagnosis not present

## 2024-02-29 DIAGNOSIS — M2560 Stiffness of unspecified joint, not elsewhere classified: Secondary | ICD-10-CM | POA: Diagnosis not present

## 2024-02-29 DIAGNOSIS — I739 Peripheral vascular disease, unspecified: Secondary | ICD-10-CM | POA: Diagnosis not present

## 2024-02-29 DIAGNOSIS — M2550 Pain in unspecified joint: Secondary | ICD-10-CM | POA: Diagnosis not present

## 2024-02-29 DIAGNOSIS — D352 Benign neoplasm of pituitary gland: Secondary | ICD-10-CM | POA: Diagnosis not present

## 2024-02-29 DIAGNOSIS — I10 Essential (primary) hypertension: Secondary | ICD-10-CM | POA: Diagnosis not present

## 2024-03-12 DIAGNOSIS — F3341 Major depressive disorder, recurrent, in partial remission: Secondary | ICD-10-CM | POA: Diagnosis not present

## 2024-04-04 ENCOUNTER — Telehealth: Payer: Self-pay | Admitting: Pulmonary Disease

## 2024-04-04 DIAGNOSIS — J4550 Severe persistent asthma, uncomplicated: Secondary | ICD-10-CM

## 2024-04-04 NOTE — Telephone Encounter (Signed)
 Refill sent for FASENRA  to [facility]: [phone]  Dose: 30mg  subcut every 8 weeks  Last OV: 10/24/2023 Provider: Dr. Kassie  Next OV: not scheduled  Routing to scheduling team for follow-up on appt scheduling  Sherry Pennant, PharmD, MPH, BCPS Clinical Pharmacist (Rheumatology and Pulmonology)

## 2024-04-18 ENCOUNTER — Ambulatory Visit (HOSPITAL_COMMUNITY)
Admission: RE | Admit: 2024-04-18 | Discharge: 2024-04-18 | Disposition: A | Discharge status: Home / Self Care | Source: Ambulatory Visit | Attending: Vascular Surgery | Admitting: Vascular Surgery

## 2024-04-18 ENCOUNTER — Other Ambulatory Visit (HOSPITAL_COMMUNITY): Payer: Self-pay | Admitting: Physical Medicine and Rehabilitation

## 2024-04-18 DIAGNOSIS — R609 Edema, unspecified: Secondary | ICD-10-CM

## 2024-04-18 DIAGNOSIS — M25562 Pain in left knee: Secondary | ICD-10-CM | POA: Diagnosis not present

## 2024-04-18 DIAGNOSIS — R2242 Localized swelling, mass and lump, left lower limb: Secondary | ICD-10-CM | POA: Diagnosis not present

## 2024-04-29 DIAGNOSIS — M170 Bilateral primary osteoarthritis of knee: Secondary | ICD-10-CM | POA: Diagnosis not present

## 2024-05-06 DIAGNOSIS — F3341 Major depressive disorder, recurrent, in partial remission: Secondary | ICD-10-CM | POA: Diagnosis not present

## 2024-05-26 DIAGNOSIS — M5412 Radiculopathy, cervical region: Secondary | ICD-10-CM | POA: Diagnosis not present

## 2024-05-29 DIAGNOSIS — F330 Major depressive disorder, recurrent, mild: Secondary | ICD-10-CM | POA: Diagnosis not present

## 2024-05-30 DIAGNOSIS — M5412 Radiculopathy, cervical region: Secondary | ICD-10-CM | POA: Diagnosis not present

## 2024-06-06 DIAGNOSIS — M5412 Radiculopathy, cervical region: Secondary | ICD-10-CM | POA: Diagnosis not present

## 2024-06-10 ENCOUNTER — Ambulatory Visit (HOSPITAL_BASED_OUTPATIENT_CLINIC_OR_DEPARTMENT_OTHER): Payer: Self-pay | Admitting: Pulmonary Disease

## 2024-06-10 ENCOUNTER — Encounter (HOSPITAL_BASED_OUTPATIENT_CLINIC_OR_DEPARTMENT_OTHER): Payer: Self-pay | Admitting: Pulmonary Disease

## 2024-06-24 DIAGNOSIS — M79672 Pain in left foot: Secondary | ICD-10-CM | POA: Diagnosis not present

## 2024-06-26 DIAGNOSIS — M50122 Cervical disc disorder at C5-C6 level with radiculopathy: Secondary | ICD-10-CM | POA: Diagnosis not present

## 2024-06-26 DIAGNOSIS — M50121 Cervical disc disorder at C4-C5 level with radiculopathy: Secondary | ICD-10-CM | POA: Diagnosis not present

## 2024-07-09 DIAGNOSIS — M5412 Radiculopathy, cervical region: Secondary | ICD-10-CM | POA: Diagnosis not present

## 2024-07-26 DIAGNOSIS — F330 Major depressive disorder, recurrent, mild: Secondary | ICD-10-CM | POA: Diagnosis not present

## 2024-09-04 ENCOUNTER — Other Ambulatory Visit: Payer: Self-pay | Admitting: Internal Medicine

## 2024-09-04 DIAGNOSIS — Z1231 Encounter for screening mammogram for malignant neoplasm of breast: Secondary | ICD-10-CM

## 2024-09-05 ENCOUNTER — Telehealth: Payer: Self-pay | Admitting: Pharmacist

## 2024-09-05 ENCOUNTER — Encounter (HOSPITAL_BASED_OUTPATIENT_CLINIC_OR_DEPARTMENT_OTHER): Payer: Self-pay

## 2024-09-05 DIAGNOSIS — F330 Major depressive disorder, recurrent, mild: Secondary | ICD-10-CM | POA: Diagnosis not present

## 2024-09-05 NOTE — Telephone Encounter (Signed)
 Submitted a Prior Authorization renewal request to [facility] for FASENRA  via fax. Will update once we receive a response.  Case # [id number] Phone: [phone] Fax: [phone]  Staff message sent to scheduling team for overdue f/u w Dr. Kassie Sherry Pennant, PharmD, MPH, BCPS, CPP Clinical Pharmacist

## 2024-09-05 NOTE — Telephone Encounter (Signed)
 Received fax from [facility] stating PA is not required for [facility].

## 2024-09-10 ENCOUNTER — Inpatient Hospital Stay: Admission: RE | Admit: 2024-09-10 | Discharge: 2024-09-10 | Attending: Internal Medicine | Admitting: Internal Medicine

## 2024-09-10 DIAGNOSIS — Z1231 Encounter for screening mammogram for malignant neoplasm of breast: Secondary | ICD-10-CM | POA: Diagnosis not present

## 2024-09-11 DIAGNOSIS — F3341 Major depressive disorder, recurrent, in partial remission: Secondary | ICD-10-CM | POA: Diagnosis not present

## 2024-09-11 DIAGNOSIS — Z5181 Encounter for therapeutic drug level monitoring: Secondary | ICD-10-CM | POA: Diagnosis not present

## 2024-09-12 DIAGNOSIS — M542 Cervicalgia: Secondary | ICD-10-CM | POA: Diagnosis not present

## 2024-09-12 DIAGNOSIS — G5602 Carpal tunnel syndrome, left upper limb: Secondary | ICD-10-CM | POA: Diagnosis not present

## 2024-10-01 ENCOUNTER — Ambulatory Visit (HOSPITAL_BASED_OUTPATIENT_CLINIC_OR_DEPARTMENT_OTHER): Admitting: Pulmonary Disease

## 2024-10-12 ENCOUNTER — Other Ambulatory Visit (HOSPITAL_BASED_OUTPATIENT_CLINIC_OR_DEPARTMENT_OTHER): Payer: Self-pay | Admitting: Pulmonary Disease
